# Patient Record
Sex: Female | Born: 1935 | Race: White | Hispanic: No | Smoking: Never smoker
Health system: Southern US, Community
[De-identification: ages and names within clinical notes are randomized; demographics above are authoritative.]

## PROBLEM LIST (undated history)

## (undated) DIAGNOSIS — M810 Age-related osteoporosis without current pathological fracture: Secondary | ICD-10-CM

## (undated) DIAGNOSIS — K5900 Constipation, unspecified: Secondary | ICD-10-CM

## (undated) DIAGNOSIS — Z872 Personal history of diseases of the skin and subcutaneous tissue: Secondary | ICD-10-CM

## (undated) DIAGNOSIS — I509 Heart failure, unspecified: Secondary | ICD-10-CM

## (undated) DIAGNOSIS — Z950 Presence of cardiac pacemaker: Secondary | ICD-10-CM

## (undated) DIAGNOSIS — D649 Anemia, unspecified: Secondary | ICD-10-CM

## (undated) DIAGNOSIS — M1990 Unspecified osteoarthritis, unspecified site: Secondary | ICD-10-CM

## (undated) DIAGNOSIS — R238 Other skin changes: Secondary | ICD-10-CM

## (undated) DIAGNOSIS — R0602 Shortness of breath: Secondary | ICD-10-CM

## (undated) DIAGNOSIS — C569 Malignant neoplasm of unspecified ovary: Secondary | ICD-10-CM

## (undated) DIAGNOSIS — I4891 Unspecified atrial fibrillation: Secondary | ICD-10-CM

## (undated) DIAGNOSIS — I10 Essential (primary) hypertension: Secondary | ICD-10-CM

## (undated) DIAGNOSIS — K219 Gastro-esophageal reflux disease without esophagitis: Secondary | ICD-10-CM

## (undated) DIAGNOSIS — R233 Spontaneous ecchymoses: Secondary | ICD-10-CM

## (undated) DIAGNOSIS — C189 Malignant neoplasm of colon, unspecified: Secondary | ICD-10-CM

## (undated) DIAGNOSIS — I495 Sick sinus syndrome: Secondary | ICD-10-CM

## (undated) DIAGNOSIS — Z8619 Personal history of other infectious and parasitic diseases: Secondary | ICD-10-CM

## (undated) HISTORY — DX: Malignant neoplasm of unspecified ovary: C56.9

## (undated) HISTORY — PX: ABDOMINAL HYSTERECTOMY: SHX81

## (undated) HISTORY — PX: OOPHORECTOMY: SHX86

## (undated) HISTORY — PX: OTHER SURGICAL HISTORY: SHX169

---

## 1999-12-14 ENCOUNTER — Encounter: Payer: Self-pay | Admitting: Emergency Medicine

## 1999-12-14 ENCOUNTER — Inpatient Hospital Stay (HOSPITAL_COMMUNITY): Admission: EM | Admit: 1999-12-14 | Discharge: 1999-12-16 | Payer: Self-pay

## 2000-01-27 ENCOUNTER — Encounter: Admission: RE | Admit: 2000-01-27 | Discharge: 2000-03-17 | Payer: Self-pay | Admitting: Orthopedic Surgery

## 2002-12-29 ENCOUNTER — Inpatient Hospital Stay (HOSPITAL_COMMUNITY): Admission: EM | Admit: 2002-12-29 | Discharge: 2003-01-02 | Payer: Self-pay | Admitting: *Deleted

## 2002-12-30 ENCOUNTER — Encounter: Payer: Self-pay | Admitting: Internal Medicine

## 2003-01-02 ENCOUNTER — Ambulatory Visit: Admission: RE | Admit: 2003-01-02 | Discharge: 2003-01-02 | Payer: Self-pay | Admitting: Gynecology

## 2003-01-09 ENCOUNTER — Inpatient Hospital Stay (HOSPITAL_COMMUNITY): Admission: RE | Admit: 2003-01-09 | Discharge: 2003-01-17 | Payer: Self-pay | Admitting: Obstetrics & Gynecology

## 2003-01-09 ENCOUNTER — Encounter (INDEPENDENT_AMBULATORY_CARE_PROVIDER_SITE_OTHER): Payer: Self-pay

## 2003-02-09 ENCOUNTER — Encounter: Admission: RE | Admit: 2003-02-09 | Discharge: 2003-02-09 | Payer: Self-pay | Admitting: Oncology

## 2003-07-02 ENCOUNTER — Encounter: Admission: RE | Admit: 2003-07-02 | Discharge: 2003-09-30 | Payer: Self-pay | Admitting: Oncology

## 2003-07-10 ENCOUNTER — Ambulatory Visit: Admission: RE | Admit: 2003-07-10 | Discharge: 2003-07-10 | Payer: Self-pay | Admitting: Gynecology

## 2003-12-18 ENCOUNTER — Ambulatory Visit: Admission: RE | Admit: 2003-12-18 | Discharge: 2003-12-18 | Payer: Self-pay | Admitting: Gynecology

## 2004-01-13 ENCOUNTER — Inpatient Hospital Stay (HOSPITAL_COMMUNITY): Admission: AD | Admit: 2004-01-13 | Discharge: 2004-01-16 | Payer: Self-pay | Admitting: Cardiology

## 2004-01-13 ENCOUNTER — Emergency Department (HOSPITAL_COMMUNITY): Admission: EM | Admit: 2004-01-13 | Discharge: 2004-01-13 | Payer: Self-pay | Admitting: Emergency Medicine

## 2004-01-15 ENCOUNTER — Encounter (INDEPENDENT_AMBULATORY_CARE_PROVIDER_SITE_OTHER): Payer: Self-pay | Admitting: Cardiology

## 2004-02-14 ENCOUNTER — Ambulatory Visit (HOSPITAL_COMMUNITY): Admission: RE | Admit: 2004-02-14 | Discharge: 2004-02-14 | Payer: Self-pay | Admitting: Cardiology

## 2004-03-25 ENCOUNTER — Ambulatory Visit (HOSPITAL_COMMUNITY): Admission: RE | Admit: 2004-03-25 | Discharge: 2004-03-25 | Payer: Self-pay | Admitting: Cardiology

## 2004-04-08 ENCOUNTER — Ambulatory Visit: Payer: Self-pay | Admitting: Oncology

## 2004-05-27 ENCOUNTER — Inpatient Hospital Stay (HOSPITAL_COMMUNITY): Admission: AD | Admit: 2004-05-27 | Discharge: 2004-05-31 | Payer: Self-pay | Admitting: Cardiology

## 2004-07-07 ENCOUNTER — Ambulatory Visit: Payer: Self-pay | Admitting: Oncology

## 2004-10-06 ENCOUNTER — Ambulatory Visit: Payer: Self-pay | Admitting: Oncology

## 2004-10-10 ENCOUNTER — Ambulatory Visit: Admission: RE | Admit: 2004-10-10 | Discharge: 2004-10-10 | Payer: Self-pay | Admitting: Gynecology

## 2005-01-01 ENCOUNTER — Ambulatory Visit: Payer: Self-pay | Admitting: Oncology

## 2005-06-11 ENCOUNTER — Ambulatory Visit: Payer: Self-pay | Admitting: Oncology

## 2005-07-24 ENCOUNTER — Ambulatory Visit: Admission: RE | Admit: 2005-07-24 | Discharge: 2005-07-24 | Payer: Self-pay | Admitting: Gynecology

## 2005-08-06 ENCOUNTER — Encounter: Admission: RE | Admit: 2005-08-06 | Discharge: 2005-09-02 | Payer: Self-pay | Admitting: Family Medicine

## 2005-09-03 ENCOUNTER — Encounter: Admission: RE | Admit: 2005-09-03 | Discharge: 2005-10-05 | Payer: Self-pay | Admitting: Family Medicine

## 2005-12-30 ENCOUNTER — Ambulatory Visit: Payer: Self-pay | Admitting: Oncology

## 2006-03-16 DIAGNOSIS — I495 Sick sinus syndrome: Secondary | ICD-10-CM

## 2006-03-16 HISTORY — DX: Sick sinus syndrome: I49.5

## 2006-03-16 HISTORY — PX: PACEMAKER PLACEMENT: SHX43

## 2006-06-24 ENCOUNTER — Ambulatory Visit: Payer: Self-pay | Admitting: Oncology

## 2006-06-29 LAB — CA 125: CA 125: 10.1 U/mL (ref 0.0–30.2)

## 2006-08-06 ENCOUNTER — Ambulatory Visit: Admission: RE | Admit: 2006-08-06 | Discharge: 2006-08-06 | Payer: Self-pay | Admitting: Gynecology

## 2006-12-24 ENCOUNTER — Ambulatory Visit: Payer: Self-pay | Admitting: Oncology

## 2006-12-28 LAB — CBC WITH DIFFERENTIAL/PLATELET
BASO%: 0.9 % (ref 0.0–2.0)
Eosinophils Absolute: 0 10*3/uL (ref 0.0–0.5)
MCV: 91.7 fL (ref 81.0–101.0)
MONO#: 0.4 10*3/uL (ref 0.1–0.9)
MONO%: 10.6 % (ref 0.0–13.0)
NEUT#: 2.5 10*3/uL (ref 1.5–6.5)
RBC: 4.26 10*6/uL (ref 3.70–5.32)
RDW: 10.8 % — ABNORMAL LOW (ref 11.3–14.5)
WBC: 4.1 10*3/uL (ref 3.9–10.0)

## 2006-12-28 LAB — COMPREHENSIVE METABOLIC PANEL
ALT: 13 U/L (ref 0–35)
AST: 20 U/L (ref 0–37)
CO2: 27 mEq/L (ref 19–32)
Sodium: 141 mEq/L (ref 135–145)
Total Bilirubin: 0.5 mg/dL (ref 0.3–1.2)
Total Protein: 6.6 g/dL (ref 6.0–8.3)

## 2006-12-28 LAB — CA 125: CA 125: 9.2 U/mL (ref 0.0–30.2)

## 2007-07-12 ENCOUNTER — Ambulatory Visit: Payer: Self-pay | Admitting: Oncology

## 2007-07-15 LAB — CA 125: CA 125: 8.8 U/mL (ref 0.0–30.2)

## 2007-08-05 ENCOUNTER — Ambulatory Visit: Admission: RE | Admit: 2007-08-05 | Discharge: 2007-08-05 | Payer: Self-pay | Admitting: Gynecology

## 2007-12-26 ENCOUNTER — Ambulatory Visit: Payer: Self-pay | Admitting: Oncology

## 2007-12-28 LAB — CBC WITH DIFFERENTIAL/PLATELET
BASO%: 0.5 % (ref 0.0–2.0)
Basophils Absolute: 0 10*3/uL (ref 0.0–0.1)
EOS%: 1.1 % (ref 0.0–7.0)
Eosinophils Absolute: 0.1 10*3/uL (ref 0.0–0.5)
HCT: 40.5 % (ref 34.8–46.6)
HGB: 14.1 g/dL (ref 11.6–15.9)
LYMPH%: 25.6 % (ref 14.0–48.0)
MCH: 31.3 pg (ref 26.0–34.0)
MCHC: 34.7 g/dL (ref 32.0–36.0)
MCV: 90.2 fL (ref 81.0–101.0)
MONO#: 0.5 10*3/uL (ref 0.1–0.9)
MONO%: 9.7 % (ref 0.0–13.0)
NEUT#: 3 10*3/uL (ref 1.5–6.5)
NEUT%: 63.1 % (ref 39.6–76.8)
Platelets: 208 10*3/uL (ref 145–400)
RBC: 4.5 10*6/uL (ref 3.70–5.32)
RDW: 13.7 % (ref 11.3–14.5)
WBC: 4.8 10*3/uL (ref 3.9–10.0)
lymph#: 1.2 10*3/uL (ref 0.9–3.3)

## 2007-12-28 LAB — COMPREHENSIVE METABOLIC PANEL
ALT: 16 U/L (ref 0–35)
AST: 21 U/L (ref 0–37)
Albumin: 4.3 g/dL (ref 3.5–5.2)
Alkaline Phosphatase: 52 U/L (ref 39–117)
BUN: 12 mg/dL (ref 6–23)
CO2: 30 mEq/L (ref 19–32)
Calcium: 9.7 mg/dL (ref 8.4–10.5)
Chloride: 103 mEq/L (ref 96–112)
Creatinine, Ser: 0.88 mg/dL (ref 0.40–1.20)
Glucose, Bld: 87 mg/dL (ref 70–99)
Potassium: 4.4 mEq/L (ref 3.5–5.3)
Sodium: 141 mEq/L (ref 135–145)
Total Bilirubin: 0.6 mg/dL (ref 0.3–1.2)
Total Protein: 6.9 g/dL (ref 6.0–8.3)

## 2007-12-28 LAB — CA 125: CA 125: 8.6 U/mL (ref 0.0–30.2)

## 2008-04-04 LAB — HM PAP SMEAR: HM Pap smear: NEGATIVE

## 2008-06-28 ENCOUNTER — Ambulatory Visit (HOSPITAL_COMMUNITY): Admission: RE | Admit: 2008-06-28 | Discharge: 2008-06-28 | Payer: Self-pay | Admitting: Ophthalmology

## 2008-07-11 ENCOUNTER — Ambulatory Visit: Payer: Self-pay | Admitting: Oncology

## 2008-07-12 ENCOUNTER — Ambulatory Visit (HOSPITAL_COMMUNITY): Admission: RE | Admit: 2008-07-12 | Discharge: 2008-07-12 | Payer: Self-pay | Admitting: Ophthalmology

## 2008-07-18 LAB — CA 125: CA 125: 7.4 U/mL (ref 0.0–30.2)

## 2008-12-24 ENCOUNTER — Ambulatory Visit: Payer: Self-pay | Admitting: Oncology

## 2008-12-26 LAB — COMPREHENSIVE METABOLIC PANEL
ALT: 18 U/L (ref 0–35)
AST: 25 U/L (ref 0–37)
Albumin: 4.3 g/dL (ref 3.5–5.2)
Alkaline Phosphatase: 59 U/L (ref 39–117)
BUN: 15 mg/dL (ref 6–23)
CO2: 25 mEq/L (ref 19–32)
Calcium: 9.2 mg/dL (ref 8.4–10.5)
Chloride: 105 mEq/L (ref 96–112)
Creatinine, Ser: 0.94 mg/dL (ref 0.40–1.20)
Glucose, Bld: 94 mg/dL (ref 70–99)
Potassium: 4.4 mEq/L (ref 3.5–5.3)
Sodium: 140 mEq/L (ref 135–145)
Total Bilirubin: 0.6 mg/dL (ref 0.3–1.2)
Total Protein: 7 g/dL (ref 6.0–8.3)

## 2008-12-26 LAB — CBC WITH DIFFERENTIAL/PLATELET
BASO%: 0.5 % (ref 0.0–2.0)
Basophils Absolute: 0 10*3/uL (ref 0.0–0.1)
EOS%: 0.8 % (ref 0.0–7.0)
Eosinophils Absolute: 0 10*3/uL (ref 0.0–0.5)
HCT: 38.8 % (ref 34.8–46.6)
HGB: 13.3 g/dL (ref 11.6–15.9)
LYMPH%: 16.8 % (ref 14.0–49.7)
MCH: 31.3 pg (ref 25.1–34.0)
MCHC: 34.3 g/dL (ref 31.5–36.0)
MCV: 91.3 fL (ref 79.5–101.0)
MONO#: 0.5 10*3/uL (ref 0.1–0.9)
MONO%: 9 % (ref 0.0–14.0)
NEUT#: 4.4 10*3/uL (ref 1.5–6.5)
NEUT%: 72.9 % (ref 38.4–76.8)
Platelets: 196 10*3/uL (ref 145–400)
RBC: 4.25 10*6/uL (ref 3.70–5.45)
RDW: 13.4 % (ref 11.2–14.5)
WBC: 6.1 10*3/uL (ref 3.9–10.3)
lymph#: 1 10*3/uL (ref 0.9–3.3)

## 2008-12-26 LAB — CA 125: CA 125: 9.4 U/mL (ref 0.0–30.2)

## 2009-06-13 LAB — HM MAMMOGRAPHY: HM Mammogram: NEGATIVE

## 2009-07-24 LAB — HM DEXA SCAN

## 2010-04-05 ENCOUNTER — Encounter: Payer: Self-pay | Admitting: Oncology

## 2010-05-28 LAB — POCT INR: INR: 2 — AB (ref <=1.1)

## 2010-05-31 ENCOUNTER — Encounter: Payer: Self-pay | Admitting: *Deleted

## 2010-05-31 DIAGNOSIS — I4891 Unspecified atrial fibrillation: Secondary | ICD-10-CM

## 2010-05-31 DIAGNOSIS — Z950 Presence of cardiac pacemaker: Secondary | ICD-10-CM | POA: Insufficient documentation

## 2010-05-31 DIAGNOSIS — R609 Edema, unspecified: Secondary | ICD-10-CM | POA: Insufficient documentation

## 2010-05-31 DIAGNOSIS — M810 Age-related osteoporosis without current pathological fracture: Secondary | ICD-10-CM | POA: Insufficient documentation

## 2010-05-31 DIAGNOSIS — I10 Essential (primary) hypertension: Secondary | ICD-10-CM | POA: Insufficient documentation

## 2010-05-31 DIAGNOSIS — K589 Irritable bowel syndrome without diarrhea: Secondary | ICD-10-CM | POA: Insufficient documentation

## 2010-06-24 ENCOUNTER — Emergency Department (HOSPITAL_COMMUNITY): Admission: EM | Admit: 2010-06-24 | Payer: Self-pay | Source: Home / Self Care

## 2010-06-25 LAB — BASIC METABOLIC PANEL
BUN: 13 mg/dL (ref 6–23)
CO2: 29 mEq/L (ref 19–32)
Calcium: 9.3 mg/dL (ref 8.4–10.5)
Chloride: 104 mEq/L (ref 96–112)
Creatinine, Ser: 0.88 mg/dL (ref 0.4–1.2)
GFR calc Af Amer: >60 mL/min (ref >60)
GFR calc non Af Amer: >60 mL/min (ref >60)
Glucose, Bld: 104 mg/dL — ABNORMAL HIGH (ref 70–99)
Potassium: 3.9 mEq/L (ref 3.5–5.1)
Sodium: 140 mEq/L (ref 135–145)

## 2010-06-25 LAB — HEMOGLOBIN AND HEMATOCRIT, BLOOD
HCT: 37.5 % (ref 36.0–46.0)
Hemoglobin: 13.1 g/dL (ref 12.0–15.0)

## 2010-07-29 NOTE — Consult Note (Signed)
[name], TRIBBEY                  ACCOUNT NO.:  [id number]   MEDICAL RECORD NO.:  [id number]          PATIENT TYPE:  OUT   LOCATION:  GYN                          FACILITY:  [facility]   PHYSICIAN:  De Blanch, M.D.DATE OF BIRTH:  1923/01/24   DATE OF CONSULTATION:  08/05/2007  DATE OF DISCHARGE:                                 CONSULTATION   CHIEF COMPLAINT:  Ovarian cancer.   INTERVAL HISTORY:  The patient returns today for continuing followup.  Since her last visit she has done well.  She denies any GI or GU  symptoms.  She has no pelvic pain, pressure, vaginal bleeding or  discharge.   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  After surgical staging, she  received six cycles of carboplatin and Taxol chemotherapy, her last  being administered in March 2005.  CA125 prior to chemotherapy was 82  and has fallen to normal.  Her most recent CA125 on Jul 15, 2007, was 9  units per mL (stable).   PAST MEDICAL ILLNESS HISTORY:  1. Shingles.  2. Superficial phlebitis.  3. Recent episodes of cellulitis in her lower extremity.  4. Placement of a cardiac pacemaker.   PAST SURGICAL HISTORY:  1. Vaginal hysterectomy.  2. Ovarian cancer staging and resection in 2004.  3. Placement of a pacemaker in 2006.   ALLERGIES:  SULFA.   SOCIAL HISTORY:  The patient lives by herself.  She exercises on a  treadmill three times a week.  She does not smoke.  She has three adult  children who live nearby, who are very attentive.   CURRENT MEDICATIONS:  1. Coumadin as directed.  2. Fosamax.  3. Altace.  4. Betapace AF.  5. Vitamin D.  6. Colace.   REVIEW OF SYSTEMS:  A 10-point comprehensive review of systems is  negative except as noted above.   PHYSICAL EXAMINATION:  VITAL SIGNS:  Weight 200 pounds, blood pressure  132/84, pulse 80, respirations 20.  GENERAL:  The patient is a healthy elderly female, in no acute  distress.  HEENT:  Negative.  NECK:  Supple without thyromegaly.  NODES:  There are no supraclavicular or inguinal adenopathy.  ABDOMEN:  Soft, nontender.  No masses, organomegaly or hernia noted.  PELVIC EXAM:  EG, BUS, vagina and urethra are normal.  Cervix and uterus  surgically absent.  Adnexa without masses.  RECTOVAGINAL:  Exam confirms.  EXTREMITIES:  Lower extremities with 1+ ankle edema.   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  No evidence of recurrent disease.  Normal examination and normal CA125  value.   FOLLOWUP:  1. She will return to see Dr. Ottie Glazier P. Livesay in six months.  2. She will return to see [place] in one year.  Will continue to monitor      the CA125 values at each visit.      De Blanch, M.D.  Electronically Signed     DC/MEDQ  D:  08/05/2007  T:  08/05/2007  Job:  [id number]   cc:   Lennis P. Darrold Span, M.D.  Fax: [phone]   Gretta Cool, M.D.  Fax: [phone]   Francisca December, M.D.  Fax: [phone]   Telford Nab, R.N.  [address]  [place], [place] [address]

## 2010-07-29 NOTE — Consult Note (Signed)
[name], [name]                  ACCOUNT NO.:  [id number]   MEDICAL RECORD NO.:  [id number]          PATIENT TYPE:  OUT   LOCATION:  GYN                          FACILITY:  [facility]   PHYSICIAN:  De Blanch, M.D.DATE OF BIRTH:  18-Jun-1922   DATE OF CONSULTATION:  DATE OF DISCHARGE:                                 CONSULTATION   CHIEF COMPLAINT:  Ovarian cancer.   INTERVAL HISTORY:  Since her last visit, the patient has done well.  She  denies any GI or GU symptoms.  There is no pelvic pain, pressure,  vaginal bleeding or discharge.  Functional status is excellent.  She  recently has had her 84nd birthday.   HISTORY OF PRESENT ILLNESS:  Stage I-C clear cell carcinoma of the  ovary, initially diagnosed October 2004.  She received 6 cycles of  carboplatin and Taxol, chemotherapy, the last administered March of  2005.  CA125 prior to chemotherapy was 82 units per mL but has fallen to  normal.   CA125 on June 29, 2006 was 10 units per mL.   PAST MEDICAL HISTORY:  Medical illnesses:  Shingles, superficial  phlebitis, recent episode of cellulitis in her lower extremity,  placement of cardiac pacemaker.   PAST SURGICAL HISTORY:  Vaginal hysterectomy, ovarian cancer staging  2004, placement of pacemaker 2006.   DRUG ALLERGIES:  SULFA.   SOCIAL HISTORY:  The patient lives by herself.  She exercises on [name]  treadmill on [name] regular basis.  She does not smoke or drink.   CURRENT MEDICATIONS:  1. Coumadin.  2. Stolel.  3. Aceon.  4. Antibiotic.  5. Diuretic.   REVIEW OF SYSTEMS:  [name] 10-point comprehensive review of system negative,  except as noted above.   PHYSICAL EXAMINATION:  VITAL SIGNS:  Weight 197 pounds, blood pressure  134/80.  GENERAL:  The patient is [name] healthy elderly white female in no distress.  HEENT:  Negative.  NECK:  Supple without thyromegaly.  There is no supraclavicular or  inguinal adenopathy.  ABDOMEN:  Obese, soft, nontender.  No mass,  organomegaly, ascites or  hernias noted.  PELVIC:  EGBUS, vagina, bladder, urethra are normal.  Cervix and uterus  are surgically absent.  Adnexa without masses, rectovaginal exam  confirms.  LOWER EXTREMITIES:  1+ ankle edema.   IMPRESSION:  Stage I-C clear cell carcinoma of the ovary October 2004.  No evidence of recurrent disease.   The patient to return to see Dr. Darrold Span in 6 months, return to see [place]  in one year.  Will continue to monitor CA125 at each visit.      De Blanch, M.D.  Electronically Signed     DC/MEDQ  D:  08/06/2006  T:  08/06/2006  Job:  [id number]   cc:   Lennis P. Darrold Span, M.D.  Fax: [phone]   Gretta Cool, M.D.  Fax: [phone]   Telford Nab, R.Carla.  [address]  [place], [place] [address]   Francisca December, M.D.  Fax: [phone]

## 2010-08-01 NOTE — Op Note (Signed)
[name], [name]                  ACCOUNT NO.:  [id number]   MEDICAL RECORD NO.:  [id number]          PATIENT TYPE:  INP   LOCATION:  2903                         FACILITY:  [facility]   PHYSICIAN:  Francisca December, M.D.  DATE OF BIRTH:  1923/02/02   DATE OF PROCEDURE:  05/28/2004  DATE OF DISCHARGE:                                 OPERATIVE REPORT   PROCEDURE PERFORMED:  Elective direct-current cardioversion.   ATTENDING:  Francisca December, M.D.   INDICATIONS:  [name] is a 77 year old woman with recurrent atrial fib-  flutter.  She reported to the office yesterday with rapid ventricular  response.  She was admitted and required IV Cardizem for rate control  overnight.  She has been chronically anticoagulated.  Her INR this morning  is 2.  She is to undergo elective cardioversion at this time.   PROCEDURE NOTE:  The patient had elective cardioversion performed in the  hospital room.  Anesthesia was in attendance and induced deep anesthesia  with 75 mg of Pentothal.  At that point, the patient was delivered a 50-  joule dose of transthoracic biphasic synchronized energy.  The initial  response with asystole.  This persisted with rare junctional B.  Transthoracic pacing was required at 70 beats per minute, 80 MEV.  I slowly  decreased the rate to 30 beats per minute, but no saw no intrinsic rhythm.  I finally discontinue transthoracic pacing and she initiated a junctional  rhythm at 40 beats per minute, which is continuing.  Her blood pressure is  between 91 and 100 systolic, and the patient is awakening without evidence  of sequelae.   IMPRESSION:  Successful elective cardioversion, but with severe sick sinus  syndrome demonstrated in a junctional bradycardia.   PLAN:  I have counseled the patient to undergo and she has accepted  permanent pacemaker insertion.  Goals, risks and alternatives were  discussed.  The patient states understanding, and wishes to proceed.      JHE/MEDQ  D:  05/28/2004  T:  05/28/2004  Job:  [id number]   cc:   Ernestina Penna, M.D.  [address] [place]  [place] [address]  Fax: [phone]
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
## 2010-08-01 NOTE — Discharge Summary (Signed)
NAMELARSEN, ZETTEL                  ACCOUNT NO.:  [id number]   MEDICAL RECORD NO.:  [id number]          PATIENT TYPE:  INP   LOCATION:  6533                         FACILITY:  [facility]   PHYSICIAN:  Cassell Clement, M.D. DATE OF BIRTH:  Sep 28, 1922   DATE OF ADMISSION:  05/27/2004  DATE OF DISCHARGE:  05/31/2004                                 DISCHARGE SUMMARY   CHIEF COMPLAINT AND REASON FOR ADMISSION:  Ms. Benningfield is a 77 year old female  patient with history of longstanding atrial fibrillation on Coumadin, status  post direct current cardioversion successful to sinus rhythm in December  2005.  She presented to the office on the date of admission for followup  regarding med changes and intolerance to Diovan secondary to nausea.  She  has prior history of intolerance to Lasix secondary to swelling, and in the  past metoprolol and Cardizem were stopped due to bradycardia.  She presented  to the office with complaints of nonexertional shortness of breath, fatigue,  no tachy palpitations, mild dysuria.  She was evaluated by her primary care  physician and found to not have a UTI.  She was placed on over-the-counter  Azo.  Her blood pressure today was elevated at 140/100 and heart rate up  into the 120s and was regular on exam.  EKG in the office showed presumed  atrial flutter with 2:1 conduction.  In the office she received a total of  10 mg IV metoprolol without a significant decrease in her heart rate.  The  patient was also given Adenocard 6 mg IV per Dr. Amil Amen in the office and  this was able to show that the patient was in atrial flutter, as the  underlying rhythm.  The rate slowed, but she immediately went back into  atrial flutter with a 2:1 conduction ventricular rate in the 120s, at that  time it was determined that the patient needed to be admitted to [facility] for further evaluation regarding rate control and probable  cardioversion.  Please refer to the handwritten  H&P for additional details.   This patient was admitted with the following diagnoses:  1.  Recurrent atrial fibrillation/atrial flutter now in 2:1 conduction with      rapid ventricular response.  2.  Hypertension.  3.  Chronic Coumadin.  4.  Dysuria.   HOSPITAL COURSE:  1.  Recurrent atrial fibrillation/flutter.  The patient was admitted to the      telemetry unit where IV Cardizem was started, she was continued on p.o.      Lopressor for rate control and hypertensive issues, continued on her      Coumadin, since the patient has a history of bradycardia on beta-      blockers in the past.  Monitoring for bradycardia was also instituted.      By the following day the patient remained in atrial flutter with a      varying block, as the ventricular rate was down in the 60s.  Dr. Amil Amen      determined the patient had mild tachybrady syndrome and diastolic  heart      failure.  Office records were reviewed and there was some consideration      that the patient may be a candidate for a pacemaker for tachybrady      syndrome.  On May 29, 2004, Dr. Amil Amen opted to proceed with direct      current cardioversion.  She received 50 joules with synchronized      biphasic shock with asystole, required temporary transthoracic pacing at      70 beats per minute, gradually regained a slow rate of junctional rhythm      42-45 beats per minute, at this point it was determined the patient      would need a pacemaker implantation.  Later that day she went to the      Cath Lab where she underwent a Dual-Chamber pacemaker implant per Dr.      Amil Amen.  Please refer to his OP note for details.  She had a Medtronic      EnRhythm Dual-Chamber pacemaker inserted without any complications.  The      pacer was checked on May 29, 2004 and was functioning normally without      any device changes.  She had remained on IV Cardizem and was switched to      p.o. Cardizem on May 29, 2004.  She had also been  started on sotalol      and this was continued, QTC was less than 500 milliseconds, she was      ambulated and monitored.  By May 30, 2004, she was continued on her      sotalol load, no bradyarrhythmias, pacemaker site was unremarkable, and      by May 31, 2004, the patient was deemed appropriate for discharge      home.  QTC was less than 500 milliseconds, and Dr. Patty Sermons discharged      the patient home.  2.  UTI.  The patient had been complaining of dysuria.  Prior to admission a      urine culture was obtained and was positive for Proteus.  The patient      was started on Cipro and this was continued at discharge.   FINAL DISCHARGE DIAGNOSES:  1.  Recurrent atrial fibrillation with atrial flutter 2:1 conduction.  2.  Status post direct current cardioversion with subsequent bradyarrhythmia      junctional rhythm requiring a permanent pacemaker insertion.  3.  Status post permanent pacemaker insertion, Medtronic EnRhythm Dual-      Chamber on May 28, 2004 by Dr. Amil Amen.  4.  Status post sotalol load for recurrent atrial fibrillation.  5.  Tachybrady syndrome requiring pacemaker insertion.  6.  Proteus urinary tract infection started on Cipro.   DISCHARGE MEDICATIONS:  1.  Sotalol 80 mg b.i.d.  2.  Coumadin 1/2 tablet daily, except 1 whole tablet Monday and Fridays.  3.  Cipro 250 mg b.i.d. until gone.  4.  Instructions to not take Cardizem, Diovan, potassium, or Edecrin.   PAIN MANAGEMENT:  Tylenol as needed.   ACTIVITY:  See pacemaker discharge instructions.   WOUND CARE:  See pacemaker discharge instructions.   DIET:  Low salt, caffeine free.   SPECIAL INSTRUCTIONS:  She is to have a Coumadin Clinic appointment at Dr.  Kathi Der office on Wednesday, June 04, 2004.  She is to follow-up with Dr.  Amil Amen on Thursday, June 12, 2004 at 12:20 p.m.      ALE/MEDQ  D:  07/30/2004  T:  07/30/2004  Job:  [id number]   cc:   Christell Constant, MD

## 2011-03-27 ENCOUNTER — Inpatient Hospital Stay (HOSPITAL_COMMUNITY)
Admission: EM | Admit: 2011-03-27 | Discharge: 2011-04-13 | DRG: 981 | Disposition: A | Discharge status: Skilled Nursing Facility | Payer: Medicare Other | Attending: General Surgery | Admitting: General Surgery

## 2011-03-27 ENCOUNTER — Encounter (HOSPITAL_COMMUNITY): Payer: Self-pay

## 2011-03-27 ENCOUNTER — Emergency Department (HOSPITAL_COMMUNITY): Payer: Medicare Other

## 2011-03-27 DIAGNOSIS — D649 Anemia, unspecified: Secondary | ICD-10-CM | POA: Diagnosis present

## 2011-03-27 DIAGNOSIS — Z7901 Long term (current) use of anticoagulants: Secondary | ICD-10-CM

## 2011-03-27 DIAGNOSIS — K660 Peritoneal adhesions (postprocedural) (postinfection): Secondary | ICD-10-CM | POA: Diagnosis present

## 2011-03-27 DIAGNOSIS — C569 Malignant neoplasm of unspecified ovary: Secondary | ICD-10-CM | POA: Insufficient documentation

## 2011-03-27 DIAGNOSIS — I5033 Acute on chronic diastolic (congestive) heart failure: Secondary | ICD-10-CM | POA: Diagnosis present

## 2011-03-27 DIAGNOSIS — K5900 Constipation, unspecified: Secondary | ICD-10-CM | POA: Diagnosis present

## 2011-03-27 DIAGNOSIS — I4891 Unspecified atrial fibrillation: Secondary | ICD-10-CM | POA: Diagnosis present

## 2011-03-27 DIAGNOSIS — Z950 Presence of cardiac pacemaker: Secondary | ICD-10-CM

## 2011-03-27 DIAGNOSIS — C187 Malignant neoplasm of sigmoid colon: Secondary | ICD-10-CM | POA: Diagnosis present

## 2011-03-27 DIAGNOSIS — K6389 Other specified diseases of intestine: Secondary | ICD-10-CM | POA: Diagnosis present

## 2011-03-27 DIAGNOSIS — I495 Sick sinus syndrome: Secondary | ICD-10-CM | POA: Diagnosis present

## 2011-03-27 DIAGNOSIS — I4892 Unspecified atrial flutter: Principal | ICD-10-CM | POA: Diagnosis present

## 2011-03-27 DIAGNOSIS — Z8543 Personal history of malignant neoplasm of ovary: Secondary | ICD-10-CM

## 2011-03-27 DIAGNOSIS — I509 Heart failure, unspecified: Secondary | ICD-10-CM | POA: Diagnosis present

## 2011-03-27 DIAGNOSIS — K560 Paralytic ileus: Secondary | ICD-10-CM | POA: Diagnosis present

## 2011-03-27 DIAGNOSIS — I10 Essential (primary) hypertension: Secondary | ICD-10-CM | POA: Diagnosis present

## 2011-03-27 DIAGNOSIS — R791 Abnormal coagulation profile: Secondary | ICD-10-CM | POA: Diagnosis present

## 2011-03-27 DIAGNOSIS — K449 Diaphragmatic hernia without obstruction or gangrene: Secondary | ICD-10-CM | POA: Diagnosis present

## 2011-03-27 HISTORY — DX: Essential (primary) hypertension: I10

## 2011-03-27 HISTORY — DX: Heart failure, unspecified: I50.9

## 2011-03-27 HISTORY — DX: Unspecified atrial fibrillation: I48.91

## 2011-03-27 HISTORY — DX: Sick sinus syndrome: I49.5

## 2011-03-27 HISTORY — DX: Shortness of breath: R06.02

## 2011-03-27 LAB — PROTIME-INR
INR: 1.22 (ref 0.00–1.49)
Prothrombin Time: 15.4 seconds — ABNORMAL HIGH (ref 11.6–15.2)

## 2011-03-27 LAB — MAGNESIUM: Magnesium: 2.1 mg/dL (ref 1.5–2.5)

## 2011-03-27 LAB — COMPREHENSIVE METABOLIC PANEL
ALT: 22 U/L (ref 0–35)
AST: 28 U/L (ref 0–37)
Calcium: 9.2 mg/dL (ref 8.4–10.5)
Creatinine, Ser: 0.67 mg/dL (ref 0.50–1.10)
GFR calc Af Amer: 90 mL/min — ABNORMAL LOW (ref >90)
GFR calc non Af Amer: 77 mL/min — ABNORMAL LOW (ref >90)
Glucose, Bld: 94 mg/dL (ref 70–99)
Sodium: 139 mEq/L (ref 135–145)
Total Protein: 6.4 g/dL (ref 6.0–8.3)

## 2011-03-27 LAB — CBC
MCV: 83 fL (ref 78.0–100.0)
Platelets: 248 10*3/uL (ref 150–400)
RBC: 4 MIL/uL (ref 3.87–5.11)
WBC: 10 10*3/uL (ref 4.0–10.5)

## 2011-03-27 LAB — BASIC METABOLIC PANEL
CO2: 27 mEq/L (ref 19–32)
Calcium: 9.3 mg/dL (ref 8.4–10.5)
Chloride: 103 mEq/L (ref 96–112)
Potassium: 4 mEq/L (ref 3.5–5.1)
Sodium: 137 mEq/L (ref 135–145)

## 2011-03-27 LAB — DIFFERENTIAL
Eosinophils Relative: 0 % (ref 0–5)
Lymphocytes Relative: 7 % — ABNORMAL LOW (ref 12–46)
Lymphs Abs: 0.7 10*3/uL (ref 0.7–4.0)
Neutrophils Relative %: 84 % — ABNORMAL HIGH (ref 43–77)

## 2011-03-27 LAB — APTT: aPTT: 28 seconds (ref 24–37)

## 2011-03-27 LAB — HEPARIN LEVEL (UNFRACTIONATED): Heparin Unfractionated: 0.14 IU/mL — ABNORMAL LOW (ref 0.30–0.70)

## 2011-03-27 LAB — TSH: TSH: 0.87 u[IU]/mL (ref 0.350–4.500)

## 2011-03-27 LAB — CARDIAC PANEL(CRET KIN+CKTOT+MB+TROPI): Troponin I: <0.3 ng/mL (ref <0.30)

## 2011-03-27 MED ORDER — HEPARIN (PORCINE) IN NACL 100-0.45 UNIT/ML-% IJ SOLN
15.0000 [IU]/kg/h | INTRAMUSCULAR | Status: DC
  Administered 2011-03-27: 15 [IU]/kg/h via INTRAVENOUS
  Filled 2011-03-27 (×3): qty 250

## 2011-03-27 MED ORDER — RAMIPRIL 10 MG PO CAPS
10.0000 mg | ORAL_CAPSULE | Freq: Every day | ORAL | Status: DC
  Administered 2011-03-27 – 2011-04-02 (×7): 10 mg via ORAL
  Filled 2011-03-27 (×8): qty 1

## 2011-03-27 MED ORDER — SODIUM CHLORIDE 0.9 % IV SOLN
Freq: Once | INTRAVENOUS | Status: AC
  Administered 2011-03-27: 05:00:00 via INTRAVENOUS

## 2011-03-27 MED ORDER — HEPARIN SOD (PORCINE) IN D5W 100 UNIT/ML IV SOLN
INTRAVENOUS | Status: AC
  Filled 2011-03-27: qty 250

## 2011-03-27 MED ORDER — HEPARIN BOLUS VIA INFUSION
1000.0000 [IU] | Freq: Once | INTRAVENOUS | Status: DC
  Filled 2011-03-27: qty 1000

## 2011-03-27 MED ORDER — SOTALOL HCL 80 MG PO TABS
160.0000 mg | ORAL_TABLET | Freq: Two times a day (BID) | ORAL | Status: DC
  Administered 2011-03-27 – 2011-03-28 (×2): 160 mg via ORAL
  Administered 2011-03-28: 80 mg via ORAL
  Administered 2011-03-29 – 2011-04-13 (×29): 160 mg via ORAL
  Filled 2011-03-27 (×38): qty 2

## 2011-03-27 MED ORDER — ETHACRYNATE SODIUM 50 MG IV SOLR
50.0000 mg | Freq: Once | INTRAVENOUS | Status: DC
  Filled 2011-03-27: qty 50

## 2011-03-27 MED ORDER — HEPARIN BOLUS VIA INFUSION
5000.0000 [IU] | Freq: Once | INTRAVENOUS | Status: AC
  Administered 2011-03-27: 5000 [IU] via INTRAVENOUS
  Filled 2011-03-27: qty 5000

## 2011-03-27 MED ORDER — HEPARIN (PORCINE) IN NACL 100-0.45 UNIT/ML-% IJ SOLN
12.0000 [IU]/kg/h | INTRAMUSCULAR | Status: DC
  Administered 2011-03-27: 12 [IU]/kg/h via INTRAVENOUS
  Filled 2011-03-27 (×2): qty 250

## 2011-03-27 MED ORDER — ACETAMINOPHEN 325 MG PO TABS
650.0000 mg | ORAL_TABLET | ORAL | Status: DC | PRN
  Filled 2011-03-27: qty 1

## 2011-03-27 MED ORDER — LUBIPROSTONE 24 MCG PO CAPS
24.0000 ug | ORAL_CAPSULE | Freq: Two times a day (BID) | ORAL | Status: DC
  Administered 2011-03-27 – 2011-04-02 (×12): 24 ug via ORAL
  Filled 2011-03-27 (×16): qty 1

## 2011-03-27 MED ORDER — DILTIAZEM HCL 25 MG/5ML IV SOLN
10.0000 mg | Freq: Once | INTRAVENOUS | Status: AC
  Administered 2011-03-27: 10 mg via INTRAVENOUS
  Filled 2011-03-27: qty 5

## 2011-03-27 MED ORDER — CALCIUM CARBONATE-VITAMIN D 500-200 MG-UNIT PO TABS
1.0000 | ORAL_TABLET | Freq: Two times a day (BID) | ORAL | Status: DC
  Administered 2011-03-27 – 2011-04-02 (×13): 1 via ORAL
  Filled 2011-03-27 (×15): qty 1

## 2011-03-27 MED ORDER — SODIUM CHLORIDE 0.9 % IV SOLN
250.0000 mL | INTRAVENOUS | Status: DC | PRN
  Administered 2011-04-08 – 2011-04-11 (×4): 250 mL via INTRAVENOUS

## 2011-03-27 MED ORDER — ETHACRYNATE SODIUM 50 MG IV SOLR
50.0000 mg | Freq: Once | INTRAVENOUS | Status: AC
  Administered 2011-03-27: 50 mg via INTRAVENOUS
  Filled 2011-03-27: qty 50

## 2011-03-27 MED ORDER — POLYSACCHARIDE IRON 150 MG PO CAPS
150.0000 mg | ORAL_CAPSULE | ORAL | Status: DC
  Administered 2011-03-28 – 2011-04-01 (×5): 150 mg via ORAL
  Filled 2011-03-27 (×9): qty 1

## 2011-03-27 MED ORDER — ONDANSETRON HCL 4 MG/2ML IJ SOLN
4.0000 mg | Freq: Four times a day (QID) | INTRAMUSCULAR | Status: DC | PRN
  Administered 2011-04-03: 4 mg via INTRAVENOUS

## 2011-03-27 MED ORDER — DILTIAZEM HCL 100 MG IV SOLR
5.0000 mg/h | INTRAVENOUS | Status: DC
  Administered 2011-03-28: 5 mg/h via INTRAVENOUS
  Filled 2011-03-27: qty 100

## 2011-03-27 MED ORDER — WARFARIN SODIUM 7.5 MG PO TABS
7.5000 mg | ORAL_TABLET | Freq: Once | ORAL | Status: AC
  Administered 2011-03-27: 7.5 mg via ORAL
  Filled 2011-03-27: qty 1

## 2011-03-27 MED ORDER — HEPARIN BOLUS VIA INFUSION
2500.0000 [IU] | Freq: Once | INTRAVENOUS | Status: AC
  Administered 2011-03-27: 2500 [IU] via INTRAVENOUS
  Filled 2011-03-27: qty 2500

## 2011-03-27 MED ORDER — SODIUM CHLORIDE 0.9 % IJ SOLN: 3.0000 mL | INTRAMUSCULAR | Status: DC | PRN

## 2011-03-27 MED ORDER — HEPARIN (PORCINE) IN NACL 100-0.45 UNIT/ML-% IJ SOLN
1400.0000 [IU]/h | INTRAMUSCULAR | Status: DC
  Administered 2011-03-28: 1350 [IU]/h via INTRAVENOUS
  Administered 2011-03-29: 1400 [IU]/h via INTRAVENOUS
  Filled 2011-03-27 (×5): qty 250

## 2011-03-27 MED ORDER — SODIUM CHLORIDE 0.9 % IJ SOLN
3.0000 mL | Freq: Two times a day (BID) | INTRAMUSCULAR | Status: DC
  Administered 2011-03-29 – 2011-04-02 (×3): 3 mL via INTRAVENOUS

## 2011-03-27 NOTE — ED Provider Notes (Signed)
History     CSN: [id number]  Arrival date & time 03/27/11  0102   First MD Initiated Contact with Patient 03/27/11 0407      Chief Complaint  Patient presents with  . Shortness of Breath    (Consider location/radiation/quality/duration/timing/severity/associated sxs/prior treatment) HPI Comments: The patient is an 78 year old female with a history of hypertension, recurrent atrial fibrillation and flutter which was treated with cardioversion and pacer placement. This occurred in 2006. Since that time she has done remarkably well. Overnight the patient noticed acute onset of difficulty breathing feeling like she was "smothered" this awoke her from sleep, was associated with feeling fatigued and weak but no cough fever nausea vomiting diarrhea swelling or chest pain. She did have associated palpitations and felt like her heart was running. This has not happened in some time.  Symptoms were acute in onset Symptoms are intermittent Symptoms are moderate when heart is racing, minimal to mild when heart is in normal rhythm See associated symptoms above  Patient is a 78 y.o. female presenting with shortness of breath. The history is provided by the patient, medical records and the EMS personnel.  Shortness of Breath  Associated symptoms include shortness of breath. Pertinent negatives include no chest pain.    Past Medical History  Diagnosis Date  . Ovarian cancer   . Irregular heart rhythm   . Atrial fibrillation   . Ovarian cancer   . Tachy-brady syndrome   . Diastolic heart failure   . Hypertension     Past Surgical History  Procedure Date  . Cardiac pacemaker placement     No family history on file.  History  Substance Use Topics  . Smoking status: Never Smoker   . Smokeless tobacco: Not on file  . Alcohol Use: No    OB History    Grav Para Term Preterm Abortions TAB SAB Ect Mult Living                  Review of Systems  Constitutional: Negative for chills and  fatigue.  Respiratory: Positive for shortness of breath.   Cardiovascular: Positive for palpitations. Negative for chest pain and leg swelling.  All other systems reviewed and are negative.    Allergies  Lasix; Penicillins; and Sulfa antibiotics  Home Medications   Current Outpatient Rx  Name Route Sig Dispense Refill  . CIPROFLOXACIN HCL 500 MG PO TABS Oral Take 500 mg by mouth 2 (two) times daily.    . LUBIPROSTONE 24 MCG PO CAPS Oral Take 24 mcg by mouth 2 (two) times daily with a meal.      . POLYSACCHARIDE IRON 150 MG PO CAPS Oral Take 150 mg by mouth daily.    Marland Kitchen RAMIPRIL 10 MG PO CAPS Oral Take 10 mg by mouth daily.      Marland Kitchen RIFAMPIN 300 MG PO CAPS Oral Take 300 mg by mouth 2 (two) times daily.    Marland Kitchen SOTALOL HCL 120 MG PO TABS Oral Take 120 mg by mouth 2 (two) times daily.      . WARFARIN SODIUM 5 MG PO TABS Oral Take 5 mg by mouth as directed.      Marland Kitchen CALCIUM 600 + D PO Oral Take 1 tablet by mouth daily.      . DENOSUMAB 60 MG/ML [place] SOLN Subcutaneous Inject 60 mg into the skin once.      Marland Kitchen FEXOFENADINE-PSEUDOEPHED ER 180-240 MG PO TB24 Oral Take 1 tablet by mouth daily.      Marland Kitchen  TRIAMCINOLONE ACETONIDE 55 MCG/ACT NA INHA Nasal 2 sprays by Nasal route daily.        BP 158/72  Pulse 59  Temp(Src) 99.3 F (37.4 C) (Oral)  Resp 27  SpO2 95%  Physical Exam  Nursing note and vitals reviewed. Constitutional: She appears well-developed and well-nourished. No distress.  HENT:  Head: Normocephalic and atraumatic.  Mouth/Throat: Oropharynx is clear and moist. No oropharyngeal exudate.  Eyes: Conjunctivae and EOM are normal. Pupils are equal, round, and reactive to light. Right eye exhibits no discharge. Left eye exhibits no discharge. No scleral icterus.  Neck: Normal range of motion. Neck supple. No JVD present. No thyromegaly present.  Cardiovascular: Normal heart sounds and intact distal pulses.  Exam reveals no gallop and no friction rub.   No murmur heard.      Patient has a  regular pulse at a rate of approximately 60 with intermittent tachycardia to 150. No murmurs  Pulmonary/Chest: Effort normal and breath sounds normal. No respiratory distress. She has no wheezes. She has no rales.  Abdominal: Soft. Bowel sounds are normal. She exhibits no distension and no mass. There is no tenderness.  Musculoskeletal: Normal range of motion. She exhibits no edema and no tenderness.  Lymphadenopathy:    She has no cervical adenopathy.  Neurological: She is alert. Coordination normal.  Skin: Skin is warm and dry. No rash noted. No erythema.  Psychiatric: She has a normal mood and affect. Her behavior is normal.    ED Course  Procedures (including critical care time)  Labs Reviewed  BASIC METABOLIC PANEL - Abnormal; Notable for the following:    Glucose, Bld 185 (*)    GFR calc non Af Amer 74 (*)    GFR calc Af Amer 86 (*)    All other components within normal limits  CBC - Abnormal; Notable for the following:    Hemoglobin 10.0 (*)    HCT 33.2 (*)    MCH 25.0 (*)    RDW 25.2 (*)    All other components within normal limits  DIFFERENTIAL - Abnormal; Notable for the following:    Neutrophils Relative 84 (*)    Neutro Abs 8.4 (*)    Lymphocytes Relative 7 (*)    All other components within normal limits  PROTIME-INR - Abnormal; Notable for the following:    Prothrombin Time 15.4 (*)    All other components within normal limits  APTT  TROPONIN I   Dg Chest Port 1 View  03/27/2011  *RADIOLOGY REPORT*  Clinical Data: Short of breath  PORTABLE CHEST - 1 VIEW  Comparison: 05/29/2004.  Findings: Cardiomegaly. Monitoring leads are projected over the chest.  Dual lead left subclavian cardiac pacemaker.  Interstitial and mild alveolar pulmonary edema.  Basilar atelectasis.  Small bilateral pleural effusions.  IMPRESSION: Mild to moderate CHF.  Original Report Authenticated By: Andreas Newport, M.D.     1. Atrial flutter with rapid ventricular response   2. Congestive  heart failure   3. Anemia       MDM  Overall the patient appears in no acute distress but is having runs of what appears to be atrial flutter likely in a 2-1 block. It is a regular tachycardia that self resolves. She appears to be having approximately 32 seconds runs at a time. She does have an underlying right bundle-branch block and left anterior fascicular block so the wide-complex nature appears to be chronic making a ventricular tachycardia less likely.  EKG has been able to  catch these runs, there is no signs of significant ST or T wave changes to suggest ischemia. At this time laboratory evaluation has begun with electrolytes, renal function, blood counts, troponin, coagulation profile, chest x-ray  ED ECG REPORT   Date: 03/27/2011 0413 am  Rate: 158  Rhythm: atrial flutter  QRS Axis: left  Intervals: Atrial paced  ST/T Wave abnormalities: nonspecific ST/T changes  Conduction Disutrbances:right bundle branch block, left anterior fascicular block and Left ventricular hypertrophy  Narrative Interpretation:   Old EKG Reviewed: changes noted today has tachycardia   Patient record reviewed, apparently patient had tachybradycardia syndrome treated at 2006 with pacemaker.  Patient remains to have frequent intermittent atrial flutter with 21 block and rapid ventricular response. She does have intermittent episodes of dyspnea and lightheadedness. Her blood results show anemia, not down on prior labs. INR is 1.19, subtherapeutic given that she is taking Coumadin. Electrolytes show no significant abnormalities including renal function. Chest x-ray does show moderate congestive heart failure.  I have discussed the care with the cardiologist Dr. Mayford Knife who has accepted the patient in transfer to [facility] [place] telemetry bed. I have started heparin as an IV drip for coagulation, diltiazem bolus for rate control. I have discussed the findings with the [employer] carelink expressive service who  have interrogated the pacemaker. He states that she has been in a state of atrial flutter or atrial fibrillation intermittently significant amounts of time up to 6 hours at a time over the last week. They deny any other signs of ventricular paced tachycardia.  CRITICAL CARE Performed by: Vida Roller   Total critical care time: 35  Critical care time was exclusive of separately billable procedures and treating other patients.  Critical care was necessary to treat or prevent imminent or life-threatening deterioration.  Critical care was time spent personally by me on the following activities: development of treatment plan with patient and/or surrogate as well as nursing, discussions with consultants, evaluation of patient's response to treatment, examination of patient, obtaining history from patient or surrogate, ordering and performing treatments and interventions, ordering and review of laboratory studies, ordering and review of radiographic studies, pulse oximetry and re-evaluation of patient's condition.   Vida Roller, MD 03/27/11 [id number]

## 2011-03-27 NOTE — ED Notes (Signed)
Pt ambulated to the bathroom with no assistance. Pt back in bed and monitor was placed back on. Family at bedside.NAD noted at this time.

## 2011-03-27 NOTE — ED Notes (Signed)
Pt states that she woke up feeling SOB. Pt reports that she was told since she has a pace maker to come to the hospital whenever she feels SOB. Pt 91% on room air. Pt placed on 2 liters of O2 via nasal cannula. Pt reports feeling nauseous, denies any vomiting at this time. Breath sounds clear and regular. Positive bowel sounds in all four quadrants. Pt alert and oriented at this time.

## 2011-03-27 NOTE — ED Notes (Signed)
Attempted to call patient's on Timothy per the patient's request to notify him of patient being transferred to [facility].

## 2011-03-27 NOTE — ED Notes (Signed)
Pt awoke feeling sob, states d/t her pacemaker she was told to be checked for sob.

## 2011-03-27 NOTE — H&P (Addendum)
Admit date: 03/27/2011 Referring Physician Dr. Eber Hong Primary Cardiologist Dr. Everette Rank Chief complaint/reason for admission:  CHF/atrial flutter with RVR  HPI: :This is an 78 yo female with a history of HTN, recurrent atrial fibrillation and atrial flutter with history of cardioversion and PPM placement for tachybrady syndrome in 2006 who was in her USOH until last PM when she developed SOB that awoke her from sleep.  She felt very weak.  She denied any chest pain, nausea, vomiting, LE edema.  She did notice palpitations.  In ER she was noted to have runs of atrial flutter with 2:1 block.  She was also noted to have anemia which is new.  She is on chronic anticoagulation.  Cxray showed moderate CHF.  She was started on IV Heparin for subtherapeutic INR, Cardizem gtt and is now transferred to [facility] for further care.      PMH:    Past Medical History  Diagnosis Date  . Paroxysmal atrial fibrillation/flutter   . Diastolic heart failure   . Ovarian cancer   . Hypertension   . Tachy-brady syndrome s/p PPM   . CHF (congestive heart failure)     diastolic heart failure    PSH:    Past Surgical History  Procedure Date  . Cardiac pacemaker placement   . Abdominal hysterectomy     ALLERGIES:   Lasix; Penicillins; and Sulfa antibiotics  Prior to Admit Meds:   Prescriptions prior to admission  Medication Sig Dispense Refill  . calcium-vitamin D (OSCAL WITH D) 500-200 MG-UNIT per tablet Take 1 tablet by mouth 2 (two) times daily.      . ciprofloxacin (CIPRO) 500 MG tablet Take 500 mg by mouth 2 (two) times daily.      Marland Kitchen lubiprostone (AMITIZA) 24 MCG capsule Take 24 mcg by mouth 2 (two) times daily with a meal.        . polysaccharide iron (NIFEREX) 150 MG CAPS capsule Take 150 mg by mouth every morning.       . ramipril (ALTACE) 10 MG capsule Take 10 mg by mouth daily.        . rifampin (RIFADIN) 300 MG capsule Take 300 mg by mouth 2 (two) times daily.      . sotalol (BETAPACE) 120 MG  tablet Take 120 mg by mouth 2 (two) times daily.        Marland Kitchen warfarin (COUMADIN) 5 MG tablet Take 2.5-5 mg by mouth as directed. Take 2.5 mg (half a tablet) on thursdays, all other days take 5 mg (1 tablet)      . denosumab (PROLIA) 60 MG/ML SOLN Inject 60 mg into the skin every 6 (six) months.        Family HX:   History reviewed. No pertinent family history. Social HX:    History   Social History  . Marital Status: Widowed    Spouse Name: N/A    Number of Children: N/A  . Years of Education: N/A   Occupational History  . Not on file.   Social History Main Topics  . Smoking status: Never Smoker   . Smokeless tobacco: Never Used  . Alcohol Use: No  . Drug Use: No  . Sexually Active: No   Other Topics Concern  . Not on file   Social History Narrative  . No narrative on file     ROS:  All 11 ROS were addressed and are negative except what is stated in the HPI  PHYSICAL EXAM Filed Vitals:  03/27/11 1406  BP: 147/89  Pulse: 111  Temp: 98.3 F (36.8 C)  Resp: 22   General: Well developed, well nourished, in no acute distress Head: Eyes PERRLA, No xanthomas.   Normal cephalic and atramatic  Lungs:  Crackles at bases bilaterally. Heart:   Irregularly irregular, no murmurs/rubs or gallops            No carotid bruit. No JVD.  No abdominal bruits. No femoral bruits. Abdomen: Bowel sounds are positive, abdomen soft and non-tender without masses Extremities:   No clubbing, cyanosis or edema.  DP +1 Neuro: Alert and oriented X 3. Psych:  Good affect, responds appropriately   Labs:   Lab Results  Component Value Date   WBC 10.0 03/27/2011   HGB 10.0* 03/27/2011   HCT 33.2* 03/27/2011   MCV 83.0 03/27/2011   PLT 248 03/27/2011    Lab 03/27/11 0442  NA 137  K 4.0  CL 103  CO2 27  BUN 15  CREATININE 0.76  CALCIUM 9.3  PROT --  BILITOT --  ALKPHOS --  ALT --  AST --  GLUCOSE 185*   Lab Results  Component Value Date   TROPONINI <0.30 03/27/2011   No results  found for this basename: PTT   Lab Results  Component Value Date   INR 1.19 03/27/2011   INR 2.0* 05/28/2010       Radiology: *RADIOLOGY REPORT*  Clinical Data: Short of breath  PORTABLE CHEST - 1 VIEW  Comparison: 05/29/2004.  Findings: Cardiomegaly. Monitoring leads are projected over the  chest. Dual lead left subclavian cardiac pacemaker. Interstitial  and mild alveolar pulmonary edema. Basilar atelectasis. Small  bilateral pleural effusions.  IMPRESSION:  Mild to moderate CHF.  Original Report Authenticated By: Andreas Newport, M.D.   EKG:  A paced rhythm with RBBB  ASSESSMENT:  1.  Paroxysmal atrial flutter with 2:1 block and RVR 2.  New onset anemia 3.  Systemic anticoagulation with sub therapeutic INR 4.  Acute on chronic diastolic heart failure 5.  H/O Ovarian CA 6.  Hypertension 7.  Tachybrady syndrome s/p PPM   PLAN:   1.  Admit to telemetry bed 2.  Cycle cardiac enzymes 3.  IV Cardizem gtt for rate control 4.  2D echo to assess LV function 5.  Check TSH 6.  GI consult for anemia given her need to be anticoagulated with PAF and CHADS-VASC score of 2 7.  Increase Sotolol to 160mg  BID 8.  Daily EKG to follow QTC 9.  Discussed with pharmacy - she has an allergy to Lasix which causes swelling so will use Ethacrynic acid instead for diuresis 10.  BMET and BNP in am  Quintella Reichert, MD  03/27/2011  2:50 PM

## 2011-03-27 NOTE — ED Notes (Signed)
MD at bedside. 

## 2011-03-27 NOTE — Progress Notes (Signed)
ANTICOAGULATION CONSULT NOTE - Initial Consult  Pharmacy Consult for: Heparin/Coumadin Indication: atrial fibrillation  Allergies  Allergen Reactions  . Lasix (Furosemide) Swelling  . Penicillins Swelling    Mouth swelling and redness  . Sulfa Antibiotics Rash    Patient Measurements: Height: 5\' 7"  (170.2 cm) Weight: 189 lb 6 oz (85.9 kg) IBW/kg (Calculated) : 61.6    Vital Signs: Temp: 98.3 F (36.8 C) (01/11 1406) Temp src: Oral (01/11 1406) BP: 147/89 mmHg (01/11 1406) Pulse Rate: 111  (01/11 1406)  Labs:  Basename 03/27/11 1140 03/27/11 0442  HGB -- 10.0*  HCT -- 33.2*  PLT -- 248  APTT -- 28  LABPROT -- 15.4*  INR -- 1.19  HEPARINUNFRC 0.20* --  CREATININE -- 0.76  CKTOTAL -- --  CKMB -- --  TROPONINI -- <0.30   Estimated Creatinine Clearance: 56.8 ml/min (by C-G formula based on Cr of 0.76).  Medical History: Past Medical History  Diagnosis Date  . Irregular heart rhythm   . Diastolic heart failure   . Shortness of breath   . Ovarian cancer   . Ovarian cancer   . Hypertension   . Atrial fibrillation   . Tachy-brady syndrome   . CHF (congestive heart failure)     diastolic heart failure    Medications:  Prescriptions prior to admission  Medication Sig Dispense Refill  . calcium-vitamin D (OSCAL WITH D) 500-200 MG-UNIT per tablet Take 1 tablet by mouth 2 (two) times daily.      . ciprofloxacin (CIPRO) 500 MG tablet Take 500 mg by mouth 2 (two) times daily.      Marland Kitchen lubiprostone (AMITIZA) 24 MCG capsule Take 24 mcg by mouth 2 (two) times daily with a meal.        . polysaccharide iron (NIFEREX) 150 MG CAPS capsule Take 150 mg by mouth every morning.       . ramipril (ALTACE) 10 MG capsule Take 10 mg by mouth daily.        . rifampin (RIFADIN) 300 MG capsule Take 300 mg by mouth 2 (two) times daily.      . sotalol (BETAPACE) 120 MG tablet Take 120 mg by mouth 2 (two) times daily.        Marland Kitchen warfarin (COUMADIN) 5 MG tablet Take 2.5-5 mg by mouth as  directed. Take 2.5 mg (half a tablet) on thursdays, all other days take 5 mg (1 tablet)      . denosumab (PROLIA) 60 MG/ML SOLN Inject 60 mg into the skin every 6 (six) months.        Assessment: 88yof on coumadin pta (5mg  daily except 2.5mg  on Thursday) for afib. INR on admission is below goal at 1.19. Of note, patient had been on rifampin for ~2 weeks and completed her course of therapy yesterday. Heparin drip started for subtherapeutic INR. 6h level obtained = 0.20 after 5000 unit bolus and 800 units/hr. RPh at [facility] increased to 1000 units/hr.   Goal of Therapy:  INR 2-3 Heparin level 0.3-0.7   Plan:  1) Continue heparin at 1000 units/hr for now 2) Check an 8h heparin level (~2100 tonight) 3) Coumadin 7.5mg  x 1 4) Daily INR, heparin level, CBC  Fredrik Rigger 03/27/2011,3:47 PM

## 2011-03-27 NOTE — ED Notes (Signed)
Checked with Bed Control about bed status for Pt.  They said still waiting on available Tele bed.  Nurse informed.

## 2011-03-27 NOTE — ED Notes (Addendum)
MD at bedside. 

## 2011-03-27 NOTE — Progress Notes (Signed)
Pharmacy: Heparin  88yof on heparin for subtherapeutic INR in setting of afib. Heparin level of 0.14 is below goal on 1000 units/hr.  Plan: 1) Re-bolus 2500 units x 1 then increase heparin drip to 1250 units/hr 2) 8 hour heparin level

## 2011-03-28 ENCOUNTER — Other Ambulatory Visit: Payer: Self-pay

## 2011-03-28 LAB — HEPARIN LEVEL (UNFRACTIONATED): Heparin Unfractionated: 0.27 IU/mL — ABNORMAL LOW (ref 0.30–0.70)

## 2011-03-28 LAB — CBC
HCT: 33.3 % — ABNORMAL LOW (ref 36.0–46.0)
MCH: 24.9 pg — ABNORMAL LOW (ref 26.0–34.0)
MCHC: 30.3 g/dL (ref 30.0–36.0)
MCV: 82 fL (ref 78.0–100.0)
Platelets: 212 10*3/uL (ref 150–400)
RDW: 25.5 % — ABNORMAL HIGH (ref 11.5–15.5)

## 2011-03-28 LAB — FERRITIN: Ferritin: 103 ng/mL (ref 10–291)

## 2011-03-28 LAB — CARDIAC PANEL(CRET KIN+CKTOT+MB+TROPI)
CK, MB: 2.6 ng/mL (ref 0.3–4.0)
CK, MB: 2.8 ng/mL (ref 0.3–4.0)
Total CK: 66 U/L (ref 7–177)
Troponin I: <0.3 ng/mL (ref <0.30)

## 2011-03-28 LAB — IRON AND TIBC
Saturation Ratios: 11 % — ABNORMAL LOW (ref 20–55)
TIBC: 298 ug/dL (ref 250–470)

## 2011-03-28 LAB — BASIC METABOLIC PANEL
BUN: 13 mg/dL (ref 6–23)
CO2: 29 mEq/L (ref 19–32)
Calcium: 9 mg/dL (ref 8.4–10.5)
Chloride: 105 mEq/L (ref 96–112)
Creatinine, Ser: 0.85 mg/dL (ref 0.50–1.10)
Glucose, Bld: 98 mg/dL (ref 70–99)

## 2011-03-28 MED ORDER — DILTIAZEM HCL ER COATED BEADS 180 MG PO CP24
180.0000 mg | ORAL_CAPSULE | Freq: Every day | ORAL | Status: DC
  Administered 2011-03-28 – 2011-04-02 (×6): 180 mg via ORAL
  Filled 2011-03-28 (×7): qty 1

## 2011-03-28 MED ORDER — ETHACRYNATE SODIUM 50 MG IV SOLR
50.0000 mg | Freq: Every day | INTRAVENOUS | Status: DC
  Administered 2011-03-28: 50 mg via INTRAVENOUS
  Filled 2011-03-28 (×3): qty 50

## 2011-03-28 MED ORDER — ETHACRYNATE SODIUM 50 MG IV SOLR
50.0000 mg | Freq: Every day | INTRAVENOUS | Status: DC
  Filled 2011-03-28: qty 50

## 2011-03-28 MED ORDER — BISACODYL 5 MG PO TBEC
10.0000 mg | DELAYED_RELEASE_TABLET | Freq: Once | ORAL | Status: AC
  Administered 2011-03-29: 10 mg via ORAL
  Filled 2011-03-28: qty 2

## 2011-03-28 MED ORDER — WARFARIN SODIUM 7.5 MG PO TABS
7.5000 mg | ORAL_TABLET | Freq: Once | ORAL | Status: AC
  Administered 2011-03-28: 7.5 mg via ORAL
  Filled 2011-03-28: qty 1

## 2011-03-28 MED ORDER — PEG 3350-KCL-NA BICARB-NACL 420 G PO SOLR
4000.0000 mL | Freq: Once | ORAL | Status: AC
  Administered 2011-03-29: 4000 mL via ORAL
  Filled 2011-03-28: qty 4000

## 2011-03-28 NOTE — Progress Notes (Signed)
SUBJECTIVE:  No complaints today  OBJECTIVE:   Vitals:   Filed Vitals:   03/27/11 1234 03/27/11 1406 03/27/11 2216 03/28/11 0500  BP: 147/92 147/89 141/81 109/72  Pulse: 109 111 95 65  Temp:  98.3 F (36.8 C) 97.8 F (36.6 C) 97.4 F (36.3 C)  TempSrc:  Oral Oral   Resp: 25 22 20 18   Height:  5\' 7"  (1.702 m)    Weight:  85.9 kg (189 lb 6 oz)  85.3 kg (188 lb 0.8 oz)  SpO2: 96% 94% 94% 93%   I&O's:   Intake/Output Summary (Last 24 hours) at 03/28/11 0915 Last data filed at 03/28/11 0853  Gross per 24 hour  Intake 811.47 ml  Output   1302 ml  Net -490.53 ml   TELEMETRY: Reviewed telemetry pt in atrial fibrillation   PHYSICAL EXAM General: Well developed, well nourished, in no acute distress Head: Eyes PERRLA, No xanthomas.   Normal cephalic and atramatic  Lungs:   Clear bilaterally to auscultation and percussion. Heart:   Irregularly irregular S1 S2 Pulses are 2+ & equal.            No carotid bruit. No JVD.  No abdominal bruits. No femoral bruits. Abdomen: Bowel sounds are positive, abdomen soft and non-tender without masses or                  Hernia's noted. Msk:  Back normal, normal gait. Normal strength and tone for age. Extremities:   No clubbing, cyanosis or edema.  DP +1 Neuro: Alert and oriented X 3. Psych:  Good affect, responds appropriately   LABS: Basic Metabolic Panel:  Basename 03/28/11 0510 03/27/11 1617  NA 141 139  K 3.5 3.5  CL 105 104  CO2 29 27  GLUCOSE 98 94  BUN 13 12  CREATININE 0.85 0.67  CALCIUM 9.0 9.2  MG -- 2.1  PHOS -- --   Liver Function Tests:  [facility] 03/27/11 1617  AST 28  ALT 22  ALKPHOS 50  BILITOT 0.4  PROT 6.4  ALBUMIN 3.0*    CBC:  Basename 03/28/11 0510 03/27/11 0442  WBC 5.1 10.0  NEUTROABS -- 8.4*  HGB 10.1* 10.0*  HCT 33.3* 33.2*  MCV 82.0 83.0  PLT 212 248   Cardiac Enzymes:  Basename 03/28/11 0732 03/27/11 2316 03/27/11 1601  CKTOTAL 66 67 74  CKMB 2.8 2.6 2.6  CKMBINDEX -- -- --    TROPONINI <0.30 <0.30 <0.30   Thyroid Function Tests:  Basename 03/27/11 1617  TSH 0.870  T4TOTAL --  T3FREE --  THYROIDAB --    Coag Panel:   Lab Results  Component Value Date   INR 1.35 03/28/2011   INR 1.22 03/27/2011   INR 1.19 03/27/2011    RADIOLOGY: Dg Chest Port 1 View  03/27/2011  *RADIOLOGY REPORT*  Clinical Data: Short of breath  PORTABLE CHEST - 1 VIEW  Comparison: 05/29/2004.  Findings: Cardiomegaly. Monitoring leads are projected over the chest.  Dual lead left subclavian cardiac pacemaker.  Interstitial and mild alveolar pulmonary edema.  Basilar atelectasis.  Small bilateral pleural effusions.  IMPRESSION: Mild to moderate CHF.  Original Report Authenticated By: Andreas Newport, M.D.      ASSESSMENT:  1. Paroxysmal atrial flutter/fibrillation with  RVR - on IV Cardizem gtt and Sotolol increased to 160mg  BID.  She was having periods of A paced rhythm with intermittent PAF yesterday but has been in afib all night with controlled heart rate. 2. New onset anemia  3. Systemic anticoagulation with sub therapeutic INR now on IV Heparin gtt 4. Acute on chronic diastolic heart failure with allergy to Lasix - lungs clear today after receiving Ethacrynic acid as diuretic 5. H/O Ovarian CA  6. Hypertension  7. Tachybrady syndrome s/p PPM   PLAN:   1.  Continue IV diuretics 2.  Check BNP and BMET in am 3.  GI consult today for anemia 4.  Check iron panel and ferritin, heme check stools 5.  Start Cardizem CD 180mg  daily and d/c cardizem gtt 6.  Continue IV heparin while reloading coumadin 7.  Continue Betapace  Quintella Reichert, MD  03/28/2011  9:15 AM

## 2011-03-28 NOTE — Consult Note (Signed)
[facility] Gastroenterology Consultation Note  Referring Provider:  Dr. Carolanne Grumbling  Reason for Consultation:  anemia  HPI: [name] is a 78 y.o. female recently admitted for symptomatic atrial fibrillation with rapid ventricular response.  We are asked to see her for evaluation of anemia, which is of relatively recent onset.  She has no nausea, vomiting, abdominal pain, overt blood in stool, loss-of-appetite or unintentional weight loss.  She has chronic constipation requiring over-the-counter stool softeners.  She has never had a colonoscopy or endoscopy to her recollection.  There is no known family history of colon cancer or polyps.   Past Medical History  Diagnosis Date  . Irregular heart rhythm   . Diastolic heart failure   . Shortness of breath   . Ovarian cancer   . Ovarian cancer   . Hypertension   . Atrial fibrillation   . Tachy-brady syndrome   . CHF (congestive heart failure)     diastolic heart failure    Past Surgical History  Procedure Date  . Cardiac pacemaker placement   . Abdominal hysterectomy     Prior to Admission medications   Medication Sig Start Date End Date Taking? Authorizing Provider  calcium-vitamin D (OSCAL WITH D) 500-200 MG-UNIT per tablet Take 1 tablet by mouth 2 (two) times daily.   Yes Historical Provider, MD  ciprofloxacin (CIPRO) 500 MG tablet Take 500 mg by mouth 2 (two) times daily.   Yes Historical Provider, MD  lubiprostone (AMITIZA) 24 MCG capsule Take 24 mcg by mouth 2 (two) times daily with a meal.     Yes Historical Provider, MD  polysaccharide iron (NIFEREX) 150 MG CAPS capsule Take 150 mg by mouth every morning.    Yes Historical Provider, MD  ramipril (ALTACE) 10 MG capsule Take 10 mg by mouth daily.     Yes Historical Provider, MD  rifampin (RIFADIN) 300 MG capsule Take 300 mg by mouth 2 (two) times daily.   Yes Historical Provider, MD  sotalol (BETAPACE) 120 MG tablet Take 120 mg by mouth 2 (two) times daily.     Yes Historical  Provider, MD  warfarin (COUMADIN) 5 MG tablet Take 2.5-5 mg by mouth as directed. Take 2.5 mg (half a tablet) on thursdays, all other days take 5 mg (1 tablet)   Yes Historical Provider, MD  denosumab (PROLIA) 60 MG/ML SOLN Inject 60 mg into the skin every 6 (six) months.     Historical Provider, MD    Current Facility-Administered Medications  Medication Dose Route Frequency Provider Last Rate Last Dose  . 0.9 %  sodium chloride infusion  250 mL Intravenous PRN Quintella Reichert, MD      . acetaminophen (TYLENOL) tablet 650 mg  650 mg Oral Q4H PRN Quintella Reichert, MD      . calcium-vitamin D (OSCAL WITH D) 500-200 MG-UNIT per tablet 1 tablet  1 tablet Oral BID Quintella Reichert, MD   1 tablet at 03/28/11 0958  . diltiazem (CARDIZEM CD) 24 hr capsule 180 mg  180 mg Oral Daily Quintella Reichert, MD   180 mg at 03/28/11 1028  . ethacrynic acid (EDECRIN) 50 mg in dextrose 5 % 50 mL IVPB  50 mg Intravenous Once Corky Crafts, MD   50 mg at 03/27/11 1719  . ethacrynic acid (EDECRIN) 50 mg in dextrose 5 % 50 mL IVPB  50 mg Intravenous Daily Quintella Reichert, MD      . heparin ADULT infusion 100 units/mL (25000  units/250 mL)  1,350 Units/hr Intravenous Continuous Gary Fleet Abbott, PHARMD 13.5 mL/hr at 03/28/11 0614 1,350 Units/hr at 03/28/11 0614  . heparin bolus via infusion 2,500 Units  2,500 Units Intravenous Once Hessie Diener Arco, PHARMD   2,500 Units at 03/27/11 2300  . lubiprostone (AMITIZA) capsule 24 mcg  24 mcg Oral BID WC Quintella Reichert, MD   24 mcg at 03/28/11 0833  . ondansetron (ZOFRAN) injection 4 mg  4 mg Intravenous Q6H PRN Quintella Reichert, MD      . polysaccharide iron (NIFEREX) capsule 150 mg  150 mg Oral Q0700 Quintella Reichert, MD   150 mg at 03/28/11 0615  . ramipril (ALTACE) capsule 10 mg  10 mg Oral Daily Quintella Reichert, MD   10 mg at 03/28/11 0958  . sodium chloride 0.9 % injection 3 mL  3 mL Intravenous Q12H Traci R Turner, MD      . sodium chloride 0.9 % injection 3 mL  3 mL  Intravenous PRN Quintella Reichert, MD      . sotalol (BETAPACE) tablet 160 mg  160 mg Oral BID Quintella Reichert, MD   80 mg at 03/28/11 0958  . warfarin (COUMADIN) tablet 7.5 mg  7.5 mg Oral ONCE-1800 Hessie Diener Ruffin, PHARMD   7.5 mg at 03/27/11 1719  . warfarin (COUMADIN) tablet 7.5 mg  7.5 mg Oral ONCE-1800 Gary Fleet Abbott, [place]      . DISCONTD: diltiazem (CARDIZEM) 100 mg in dextrose 5 % 100 mL infusion  5 mg/hr Intravenous Titrated Quintella Reichert, MD 5 mL/hr at 03/28/11 0230 5 mg/hr at 03/28/11 0230  . DISCONTD: ethacrynic acid (EDECRIN) injection 50 mg  50 mg Intravenous Once Quintella Reichert, MD      . DISCONTD: ethacrynic acid (EDECRIN) injection 50 mg  50 mg Intravenous Daily Quintella Reichert, MD      . DISCONTD: heparin ADULT infusion 100 units/mL (25000 units/250 mL)  12 Units/kg/hr (Adjusted) Intravenous Continuous Vida Roller, MD 8 mL/hr at 03/27/11 0606 12 Units/kg/hr at 03/27/11 0606  . DISCONTD: heparin ADULT infusion 100 units/mL (25000 units/250 mL)  15 Units/kg/hr (Adjusted) Intravenous Continuous Caryl Asp, PHARMD 10 mL/hr at 03/27/11 2219 15 Units/kg/hr at 03/27/11 2219  . DISCONTD: heparin bolus via infusion 1,000 Units  1,000 Units Intravenous Once Francisco J Valls, PHARMD        Allergies as of 03/27/2011 - Review Complete 03/27/2011  Allergen Reaction Noted  . Lasix (furosemide) Swelling 05/31/2010  . Penicillins Swelling 05/31/2010  . Sulfa antibiotics Rash 05/31/2010    History reviewed. No pertinent family history.  History   Social History  . Marital Status: Widowed    Spouse Name: N/A    Number of Children: N/A  . Years of Education: N/A   Occupational History  . Not on file.   Social History Main Topics  . Smoking status: Never Smoker   . Smokeless tobacco: Never Used  . Alcohol Use: No  . Drug Use: No  . Sexually Active: No   Other Topics Concern  . Not on file   Social History Narrative  . No narrative on file    Review of  Systems: Positive for: palpitations, fatigue, weakness, malaise, dyspnea Gen: Denies any fever, chills, rigors, night sweats, anorexia,, involuntary weight loss, and sleep disorder CV: Denies chest pain, angina, , syncope, orthopnea, PND, peripheral edema, and claudication. Resp: Denies , cough, sputum, wheezing, coughing up blood. GI: Described in detail in  HPI.    GU : Denies urinary burning, blood in urine, urinary frequency, urinary hesitancy, nocturnal urination, and urinary incontinence. MS: Denies joint pain or swelling.  Denies muscle weakness, cramps, atrophy.  Derm: Denies rash, itching, oral ulcerations, hives, unhealing ulcers.  Psych: Denies depression, anxiety, memory loss, suicidal ideation, hallucinations,  and confusion. Heme: Denies bruising, bleeding, and enlarged lymph nodes. Neuro:  Denies any headaches, dizziness, paresthesias. Endo:  Denies any problems with DM, thyroid, adrenal function.  Physical Exam: Vital signs in last 24 hours: Temp:  [97.4 F (36.3 C)-98.3 F (36.8 C)] 97.4 F (36.3 C) (01/12 0500) Pulse Rate:  [65-111] 65  (01/12 0500) Resp:  [18-25] 18  (01/12 0500) BP: (109-147)/(72-92) 109/72 mmHg (01/12 0500) SpO2:  [93 %-96 %] 93 % (01/12 0500) Weight:  [85.3 kg (188 lb 0.8 oz)-85.9 kg (189 lb 6 oz)] 85.3 kg (188 lb 0.8 oz) (01/12 0500) Last BM Date: 03/26/11 General:   Alert, overweight, much younger-appearing than stated age, non-toxic appearing Head:  Normocephalic and atraumatic. Eyes:  Sclera clear, no icterus.   Conjunctiva pink. Ears:  Normal auditory acuity. Nose:  No deformity, discharge,  or lesions. Mouth:  No deformity or lesions.  Oropharynx pink & moist. Neck:  Supple; no masses or thyromegaly. Lungs:  Clear throughout to auscultation.   No wheezes, crackles, or rhonchi. No acute distress. Heart:  Normal rate but irregularly irregular rhythm; no murmurs, clicks, rubs,  or gallops. Abdomen:  Soft, nontender and nondistended. No  masses, hepatosplenomegaly or hernias noted. Normal bowel sounds, without guarding, and without rebound.     Msk:  Symmetrical without gross deformities. Normal posture. Pulses:  Normal pulses noted. Extremities:  Without clubbing or edema. Neurologic:  Alert and  oriented x4;  grossly normal neurologically. Skin:  Scattered mild ecchymoses, otherwise intact without significant lesions or rashes. Psych:  Alert and cooperative. Normal mood and affect.   Lab Results:  Basename 03/28/11 0510 03/27/11 0442  WBC 5.1 10.0  HGB 10.1* 10.0*  HCT 33.3* 33.2*  PLT 212 248   BMET  Basename 03/28/11 0510 03/27/11 1617 03/27/11 0442  NA 141 139 137  K 3.5 3.5 4.0  CL 105 104 103  CO2 29 27 27   GLUCOSE 98 94 185*  BUN 13 12 15   CREATININE 0.85 0.67 0.76  CALCIUM 9.0 9.2 9.3   LFT  Basename 03/27/11 1617  PROT 6.4  ALBUMIN 3.0*  AST 28  ALT 22  ALKPHOS 50  BILITOT 0.4  BILIDIR --  IBILI --   PT/INR  Basename 03/28/11 0510 03/27/11 1617  LABPROT 16.9* 15.7*  INR 1.35 1.22    Studies/Results:  Impression:  1.  Anemia, unclear etiology, with anticipated need for longterm anticoagulation. 2.  Constipation, chronic.  Plan:  1.  Clear liquids and bowel preparation tomorrow. 2.  Anticipate colonoscopy +/- endoscopy on Monday 1/14. 3.  Will hold heparin gtt 4-6 hours pre endoscopic procedures. 4.  Will follow.  Thank you for the consultation.   LOS: 1 day   Keron Neenan M  03/28/2011, 11:02 AM

## 2011-03-28 NOTE — Progress Notes (Signed)
  Echocardiogram 2D Echocardiogram has been performed.  [name] 03/28/2011, 9:04 AM

## 2011-03-28 NOTE — Progress Notes (Signed)
ANTICOAGULATION CONSULT NOTE - Initial Consult  Pharmacy Consult for: Heparin/Coumadin Indication: atrial fibrillation  Allergies  Allergen Reactions  . Lasix (Furosemide) Swelling  . Penicillins Swelling    Mouth swelling and redness  . Sulfa Antibiotics Rash    Patient Measurements: Height: 5\' 7"  (170.2 cm) Weight: 188 lb 0.8 oz (85.3 kg) IBW/kg (Calculated) : 61.6    Vital Signs: Temp: 97.4 F (36.3 C) (01/12 0500) Temp src: Oral (01/11 2216) BP: 109/72 mmHg (01/12 0500) Pulse Rate: 65  (01/12 0500)  Labs:  Basename 03/28/11 0510 03/27/11 2316 03/27/11 2159 03/27/11 1617 03/27/11 1601 03/27/11 1140 03/27/11 0442  HGB 10.1* -- -- -- -- -- 10.0*  HCT 33.3* -- -- -- -- -- 33.2*  PLT 212 -- -- -- -- -- 248  APTT -- -- -- -- -- -- 28  LABPROT 16.9* -- -- 15.7* -- -- 15.4*  INR 1.35 -- -- 1.22 -- -- 1.19  HEPARINUNFRC 0.27* -- 0.14* -- -- 0.20* --  CREATININE -- -- -- 0.67 -- -- 0.76  CKTOTAL -- 67 -- -- 74 -- --  CKMB -- 2.6 -- -- 2.6 -- --  TROPONINI -- <0.30 -- -- <0.30 -- <0.30   Estimated Creatinine Clearance: 56.7 ml/min (by C-G formula based on Cr of 0.67).   Assessment: 78 yo female with h/o Afib for anticoagulation.  Prior treatment with rifampin noted.  Goal of Therapy:  INR 2-3 Heparin level 0.3-0.7   Plan:  Increase Heparin 1350 units/hr Coumadin 7.5 mg tonight.  Eddie Candle 03/28/2011,6:06 AM

## 2011-03-29 ENCOUNTER — Other Ambulatory Visit: Payer: Self-pay

## 2011-03-29 LAB — PROTIME-INR: INR: 1.25 (ref 0.00–1.49)

## 2011-03-29 LAB — CBC
HCT: 35 % — ABNORMAL LOW (ref 36.0–46.0)
Hemoglobin: 10.7 g/dL — ABNORMAL LOW (ref 12.0–15.0)
MCV: 82.2 fL (ref 78.0–100.0)
RDW: 25.4 % — ABNORMAL HIGH (ref 11.5–15.5)
WBC: 4.6 10*3/uL (ref 4.0–10.5)

## 2011-03-29 LAB — BASIC METABOLIC PANEL
BUN: 17 mg/dL (ref 6–23)
CO2: 29 mEq/L (ref 19–32)
Chloride: 105 mEq/L (ref 96–112)
Creatinine, Ser: 0.88 mg/dL (ref 0.50–1.10)
Potassium: 3.9 mEq/L (ref 3.5–5.1)

## 2011-03-29 MED ORDER — WARFARIN SODIUM 10 MG PO TABS
10.0000 mg | ORAL_TABLET | Freq: Once | ORAL | Status: DC
  Filled 2011-03-29: qty 1

## 2011-03-29 NOTE — Progress Notes (Signed)
ANTICOAGULATION CONSULT NOTE - Follow Up Consult  Pharmacy Consult for: Heparin/Coumadin  Indication: atrial fibrillation  Allergies  Allergen Reactions  . Lasix (Furosemide) Swelling  . Penicillins Swelling    Mouth swelling and redness  . Sulfa Antibiotics Rash    Patient Measurements: Height: 5\' 7"  (170.2 cm) Weight: 181 lb 14.1 oz (82.5 kg) IBW/kg (Calculated) : 61.6   Vital Signs: Temp: 98.2 F (36.8 C) (01/13 0500) BP: 117/79 mmHg (01/13 0500) Pulse Rate: 97  (01/13 0500)  Labs:  Basename 03/29/11 0530 03/28/11 0732 03/28/11 0510 03/27/11 2316 03/27/11 2159 03/27/11 1617 03/27/11 1601 03/27/11 0442  HGB 10.7* -- 10.1* -- -- -- -- --  HCT 35.0* -- 33.3* -- -- -- -- 33.2*  PLT 230 -- 212 -- -- -- -- 248  APTT -- -- -- -- -- -- -- 28  LABPROT 16.0* -- 16.9* -- -- 15.7* -- --  INR 1.25 -- 1.35 -- -- 1.22 -- --  HEPARINUNFRC 0.30 -- 0.27* -- 0.14* -- -- --  CREATININE 0.88 -- 0.85 -- -- 0.67 -- --  CKTOTAL -- 66 -- 67 -- -- 74 --  CKMB -- 2.8 -- 2.6 -- -- 2.6 --  TROPONINI -- <0.30 -- <0.30 -- -- <0.30 --   Estimated Creatinine Clearance: 50.7 ml/min (by C-G formula based on Cr of 0.88).   Medications:  Scheduled:    . bisacodyl  10 mg Oral Once  . calcium-vitamin D  1 tablet Oral BID  . diltiazem  180 mg Oral Daily  . ethadrynate sodium (EDECRIN) IVPB  50 mg Intravenous Daily  . lubiprostone  24 mcg Oral BID WC  . polyethylene glycol-electrolytes  4,000 mL Oral Once  . polysaccharide iron  150 mg Oral Q0700  . ramipril  10 mg Oral Daily  . sodium chloride  3 mL Intravenous Q12H  . sotalol  160 mg Oral BID  . warfarin  7.5 mg Oral ONCE-1800  . DISCONTD: ethacrynic acid  50 mg Intravenous Daily    Assessment: [name] with Afib on heparin and coumadin. Heparin level low end therapeutic, INR remains below goal. Patient finished 2 week course of Rifampin prior to admission, which could increase the metabolism of coumadin. Patient has new onset anemia, but her  hgb has been stable since admission, Iron level is low on PO iron supplement. No active bleeding noted per chart  Goal of Therapy:  INR 2-3  Heparin level 0.3-0.7   Plan:  - Increase heparin level slightly to 1400 units/hr to stay in therapeutic range - coumadin 10mg  po x 1 this evening - f/u INR and heparin level Monday morning   [name] 03/29/2011,7:45 AM

## 2011-03-29 NOTE — Progress Notes (Signed)
SUBJECTIVE:  Feels good, denies SOB  OBJECTIVE:   Vitals:   Filed Vitals:   03/28/11 0500 03/28/11 1400 03/28/11 2100 03/29/11 0500  BP: 109/72 107/63 112/68 117/79  Pulse: 65 111 73 97  Temp: 97.4 F (36.3 C) 98.1 F (36.7 C) 98.7 F (37.1 C) 98.2 F (36.8 C)  TempSrc:  Oral    Resp: 18 22 20 18   Height:      Weight: 85.3 kg (188 lb 0.8 oz)   82.5 kg (181 lb 14.1 oz)  SpO2: 93% 95% 94% 93%   I&O's:   Intake/Output Summary (Last 24 hours) at 03/29/11 1001 Last data filed at 03/29/11 2130  Gross per 24 hour  Intake    663 ml  Output   2941 ml  Net  -2278 ml   TELEMETRY: Reviewed telemetry pt in atrial fibrillation     PHYSICAL EXAM General: Well developed, well nourished, in no acute distress Head: Eyes PERRLA, No xanthomas.   Normal cephalic and atramatic  Lungs:   Few crackles that clear with cough Heart:   Irregularly irregular S1 S2 Pulses are 2+ & equal.            No carotid bruit. No JVD.  No abdominal bruits. No femoral bruits. Abdomen: Bowel sounds are positive, abdomen soft and non-tender without masses Extremities:   No clubbing, cyanosis or edema.  DP +1 Neuro: Alert and oriented X 3. Psych:  Good affect, responds appropriately   LABS: Basic Metabolic Panel:  Basename 03/29/11 0530 03/28/11 0510 03/27/11 1617  NA 141 141 --  K 3.9 3.5 --  CL 105 105 --  CO2 29 29 --  GLUCOSE 102* 98 --  BUN 17 13 --  CREATININE 0.88 0.85 --  CALCIUM 9.1 9.0 --  MG -- -- 2.1  PHOS -- -- --   Liver Function Tests:  [facility] 03/27/11 1617  AST 28  ALT 22  ALKPHOS 50  BILITOT 0.4  PROT 6.4  ALBUMIN 3.0*    CBC:  Basename 03/29/11 0530 03/28/11 0510 03/27/11 0442  WBC 4.6 5.1 --  NEUTROABS -- -- 8.4*  HGB 10.7* 10.1* --  HCT 35.0* 33.3* --  MCV 82.2 82.0 --  PLT 230 212 --   Cardiac Enzymes:  Basename 03/28/11 0732 03/27/11 2316 03/27/11 1601  CKTOTAL 66 67 74  CKMB 2.8 2.6 2.6  CKMBINDEX -- -- --  TROPONINI <0.30 <0.30 <0.30   Thyroid  Function Tests:  Basename 03/27/11 2159  TSH 1.563  T4TOTAL --  T3FREE --  THYROIDAB --   Anemia Panel:  Basename 03/28/11 1018  VITAMINB12 --  FOLATE --  FERRITIN 103  TIBC 298  IRON 34*  RETICCTPCT --   Coag Panel:   Lab Results  Component Value Date   INR 1.25 03/29/2011   INR 1.35 03/28/2011   INR 1.22 03/27/2011    RADIOLOGY: Dg Chest Port 1 View  03/27/2011  *RADIOLOGY REPORT*  Clinical Data: Short of breath  PORTABLE CHEST - 1 VIEW  Comparison: 05/29/2004.  Findings: Cardiomegaly. Monitoring leads are projected over the chest.  Dual lead left subclavian cardiac pacemaker.  Interstitial and mild alveolar pulmonary edema.  Basilar atelectasis.  Small bilateral pleural effusions.  IMPRESSION: Mild to moderate CHF.  Original Report Authenticated By: Andreas Newport, M.D.      ASSESSMENT:  1. Paroxysmal atrial flutter/fibrillation with RVR - on PO Cardizem and Sotolol 160mg  BID. She was having periods of A paced rhythm with intermittent PAF on day  of admit but currently is afib with controlled VR 2. New onset anemia - appreciate GI input - for colonoscopy tomorrow 3. Systemic anticoagulation with sub therapeutic INR now on IV Heparin gtt while reloading coumadin which was subtherapeutic on admit despite patient stating she takes it daily - Heparin to be held transiently for colonoscopy tomorrow 4. Acute on chronic diastolic heart failure with allergy to Lasix  - resolved 5. H/O Ovarian CA  6. Hypertension  7. Tachybrady syndrome s/p PPM   PLAN:   1.  Colonoscopy in am 2.  Continue coumadin load and IV Heparin gtt 3.  D/C IV Ethacrynic acid  Quintella Reichert, MD  03/29/2011  10:01 AM

## 2011-03-29 NOTE — Progress Notes (Signed)
EKG obtained prior to administration of evening dose of sotalol.  Qtc per EKG >500. MD notified.  Order given to hold medication for evening dose.  Will continue to monitor pt.

## 2011-03-29 NOTE — Progress Notes (Signed)
Subjective: No complaints.  No overt blood in stool.  Objective: Vital signs in last 24 hours: Temp:  [98.2 F (36.8 C)-98.7 F (37.1 C)] 98.2 F (36.8 C) (01/13 0500) Pulse Rate:  [73-97] 97  (01/13 0500) Resp:  [18-20] 18  (01/13 0500) BP: (112-117)/(68-79) 117/79 mmHg (01/13 0500) SpO2:  [93 %-94 %] 93 % (01/13 0500) Weight:  [82.5 kg (181 lb 14.1 oz)] 82.5 kg (181 lb 14.1 oz) (01/13 0500) Weight change: -3.4 kg (-7 lb 7.9 oz) Last BM Date: 03/28/11  PE: GEN:  NAD, much younger appearing than stated age. ABD:  Soft  Lab Results: CBC    Component Value Date/Time   WBC 4.6 03/29/2011 0530   WBC 6.1 12/26/2008 0943   RBC 4.26 03/29/2011 0530   RBC 4.25 12/26/2008 0943   HGB 10.7* 03/29/2011 0530   HGB 13.3 12/26/2008 0943   HCT 35.0* 03/29/2011 0530   HCT 38.8 12/26/2008 0943   PLT 230 03/29/2011 0530   PLT 196 12/26/2008 0943   MCV 82.2 03/29/2011 0530   MCV 91.3 12/26/2008 0943   MCH 25.1* 03/29/2011 0530   MCH 31.3 12/26/2008 0943   MCHC 30.6 03/29/2011 0530   MCHC 34.3 12/26/2008 0943   RDW 25.4* 03/29/2011 0530   RDW 13.4 12/26/2008 0943   LYMPHSABS 0.7 03/27/2011 0442   LYMPHSABS 1.0 12/26/2008 0943   MONOABS 0.9 03/27/2011 0442   MONOABS 0.5 12/26/2008 0943   EOSABS 0.0 03/27/2011 0442   EOSABS 0.0 12/26/2008 0943   BASOSABS 0.0 03/27/2011 0442   BASOSABS 0.0 12/26/2008 0943   Assessment:  1.  Microcytic anemia. 2.  Atrial fibrillation, with long-term anticipated need for anticoagulation.  Plan:  1.  Colonoscopy (+/- EGD) planned for tomorrow, Monday 03/30/11, at 8:30 a.m. 2.  Will hold heparin gtt 4:30 a.m., in anticipation of above endoscopic procedures. 3.  Will follow.   [name] 03/29/2011, 2:43 PM

## 2011-03-30 ENCOUNTER — Other Ambulatory Visit: Payer: Self-pay | Admitting: Gastroenterology

## 2011-03-30 ENCOUNTER — Other Ambulatory Visit: Payer: Self-pay

## 2011-03-30 ENCOUNTER — Encounter (HOSPITAL_COMMUNITY): Payer: Self-pay | Admitting: *Deleted

## 2011-03-30 ENCOUNTER — Encounter (HOSPITAL_COMMUNITY): Admission: EM | Disposition: A | Discharge status: Skilled Nursing Facility | Payer: Self-pay | Source: Home / Self Care

## 2011-03-30 DIAGNOSIS — K6389 Other specified diseases of intestine: Secondary | ICD-10-CM | POA: Diagnosis present

## 2011-03-30 DIAGNOSIS — C189 Malignant neoplasm of colon, unspecified: Secondary | ICD-10-CM

## 2011-03-30 HISTORY — PX: COLONOSCOPY: SHX5424

## 2011-03-30 HISTORY — PX: ESOPHAGOGASTRODUODENOSCOPY: SHX5428

## 2011-03-30 LAB — BASIC METABOLIC PANEL
CO2: 26 mEq/L (ref 19–32)
Chloride: 108 mEq/L (ref 96–112)
Creatinine, Ser: 0.78 mg/dL (ref 0.50–1.10)
GFR calc Af Amer: 85 mL/min — ABNORMAL LOW (ref >90)
Potassium: 3.7 mEq/L (ref 3.5–5.1)
Sodium: 142 mEq/L (ref 135–145)

## 2011-03-30 LAB — PROTIME-INR
INR: 1.34 (ref 0.00–1.49)
Prothrombin Time: 16.8 seconds — ABNORMAL HIGH (ref 11.6–15.2)

## 2011-03-30 LAB — CBC
HCT: 35.4 % — ABNORMAL LOW (ref 36.0–46.0)
Hemoglobin: 10.8 g/dL — ABNORMAL LOW (ref 12.0–15.0)
MCV: 81.9 fL (ref 78.0–100.0)
RBC: 4.32 MIL/uL (ref 3.87–5.11)
WBC: 3.4 10*3/uL — ABNORMAL LOW (ref 4.0–10.5)

## 2011-03-30 SURGERY — COLONOSCOPY
Anesthesia: Moderate Sedation

## 2011-03-30 MED ORDER — SODIUM CHLORIDE 0.9 % IV SOLN: Freq: Once | INTRAVENOUS | Status: DC

## 2011-03-30 MED ORDER — MIDAZOLAM HCL 10 MG/2ML IJ SOLN
INTRAMUSCULAR | Status: AC
  Filled 2011-03-30: qty 4

## 2011-03-30 MED ORDER — MIDAZOLAM HCL 10 MG/2ML IJ SOLN
INTRAMUSCULAR | Status: DC | PRN
  Administered 2011-03-30 (×4): 1 mg via INTRAVENOUS

## 2011-03-30 MED ORDER — BUTAMBEN-TETRACAINE-BENZOCAINE 2-2-14 % EX AERO
INHALATION_SPRAY | CUTANEOUS | Status: DC | PRN
  Administered 2011-03-30: 2 via TOPICAL

## 2011-03-30 MED ORDER — FENTANYL NICU IV SYRINGE 50 MCG/ML
INJECTION | INTRAMUSCULAR | Status: DC | PRN
  Administered 2011-03-30 (×4): 12.5 ug via INTRAVENOUS

## 2011-03-30 MED ORDER — SODIUM CHLORIDE 0.9 % IV SOLN
Freq: Once | INTRAVENOUS | Status: AC
  Administered 2011-03-30: 500 mL via INTRAVENOUS

## 2011-03-30 MED ORDER — FENTANYL CITRATE 0.05 MG/ML IJ SOLN
INTRAMUSCULAR | Status: AC
  Filled 2011-03-30: qty 4

## 2011-03-30 MED FILL — Diltiazem HCl IV For Soln 100 MG: INTRAVENOUS | Qty: 100 | Status: AC

## 2011-03-30 NOTE — Brief Op Note (Signed)
03/27/2011 - 03/30/2011  9:23 AM  PATIENT:  [name]  78 y.o. female  PRE-OPERATIVE DIAGNOSIS:  Anemia  POST-OPERATIVE DIAGNOSIS:  sigmoid colon mass; EGD unremarkable  PROCEDURE:  Procedure(s): COLONOSCOPY ESOPHAGOGASTRODUODENOSCOPY (EGD)  SURGEON:  Surgeon(s): Barrie Folk, MD  Please see formal procedure report for details.

## 2011-03-30 NOTE — Consult Note (Signed)
Reason for Consult: Sigmoid colon mass Consulting Surgeon: [name] Referring Physician: Madilyn [name]   HPI: [name] is an 78 y.o. female who presented with Afib and RVR but was found to be anemic. She has been worked up and a colonoscopy today finds a non-obstructing sigmoid mass, biopsy now pending. She denies any sxs of wt loss, hematochezia, melena, BPR. She had no prior colonoscopies. She's had some prior pelvic surgery for a large ovarian mass, that was benign according to the family, but she did undergo some chemotherapy "as a precaution". She has been prepped and Dr. Madilyn [name] is suspicious of malignancy and has requested surgery eval. She has a hx of chronic a.fib and is on Couamdin, but has been subtherapeutic.  Past Medical History:  Past Medical History  Diagnosis Date  . Irregular heart rhythm   . Diastolic heart failure   . Shortness of breath   . Ovarian cancer   . Ovarian cancer   . Hypertension   . Atrial fibrillation   . Tachy-brady syndrome   . CHF (congestive heart failure)     diastolic heart failure    Surgical History:  Past Surgical History  Procedure Date  . Cardiac pacemaker placement   . Abdominal hysterectomy   . Oophorectomy     Family History:  Family History  Problem Relation Age of Onset  . Anesthesia problems Neg Hx   . Hypotension Neg Hx   . Malignant hyperthermia Neg Hx   . Pseudochol deficiency Neg Hx     Social History:  reports that she has never smoked. She has never used smokeless tobacco. She reports that she does not drink alcohol or use illicit drugs.  Allergies:  Allergies  Allergen Reactions  . Lasix (Furosemide) Swelling  . Penicillins Swelling    Mouth swelling and redness  . Sulfa Antibiotics Rash    Medications: I have reviewed the patient's current medications., including Coumadin.  ROS: See HPI for pertinent findings, otherwise complete 10 system review negative.  Physical Exam: Blood pressure 147/66, pulse 95,  temperature 97.7 F (36.5 C), temperature source Oral, resp. rate 14, height 5\' 7"  (1.702 m), weight 82 kg (180 lb 12.4 oz), SpO2 97.00%.  General Appearance:  Alert, cooperative, no distress, appears stated age.  Head:  Normocephalic, without obvious abnormality, atraumatic  ENT: Unremarkable  Neck: Supple, symmetrical, trachea midline, no adenopathy, thyroid: not enlarged, symmetric, no tenderness/mass/nodules  Lungs:   Clear to auscultation bilaterally, no w/r/r, respirations unlabored without use of accessory muscles.  Chest Wall:  No tenderness or deformity  Heart:  Irregular rate and rhythm, S1, S2 normal, no murmur, rub or gallop. Carotids 2+ without bruit.  Abdomen:   Soft, non-tender, non distended. Bowel sounds hypoactive. Large midline surgical scar compatible with prior hx.  Genitalia:  Normal. No hernias  Rectal:  Deferred.  Extremities: Extremities normal, atraumatic, no cyanosis or edema  Pulses: 2+ and symmetric  Skin: Skin color, texture, turgor normal, no rashes or lesions  Neurologic: Normal affect, no gross deficits.     Labs: CBC  Basename 03/30/11 0500 03/29/11 0530  WBC 3.4* 4.6  HGB 10.8* 10.7*  HCT 35.4* 35.0*  PLT 232 230   MET  Basename 03/30/11 0500 03/29/11 0530  NA 142 141  K 3.7 3.9  CL 108 105  CO2 26 29  GLUCOSE 104* 102*  BUN 9 17  CREATININE 0.78 0.88  CALCIUM 8.6 9.1    Basename 03/27/11 1617  PROT 6.4  ALBUMIN 3.0*  AST 28  ALT 22  ALKPHOS 50  BILITOT 0.4  BILIDIR --  IBILI --  LIPASE --   PT/INR  Basename 03/30/11 0500 03/29/11 0530  LABPROT 16.8* 16.0*  INR 1.34 1.25    Assessment: Principal Problem:  *Atrial flutter with rapid ventricular response Active Problems:  Hypertension  Diastolic CHF, acute on chronic  Anemia  Colonic mass  Plan: Await biopsy, continue clears diet only. If positive biopsy, will need staging workup pre-op, including CEA, CT chest/abd/pelvis. Discussed with pt and family likelihood  of surgical resection. Cont to hold Coumadin. Dr. [name] to eval and decide timing of surgery. Thank you for consult.    [name] F 03/30/2011, 11:08 AM

## 2011-03-30 NOTE — Progress Notes (Signed)
SUBJECTIVE:  No chest pain or palpitations.  Tolerate a colonoscopy well.  She is anticipating surgery tomorrow.  OBJECTIVE:   Vitals:   Filed Vitals:   03/30/11 1000 03/30/11 1010 03/30/11 1206 03/30/11 1400  BP: 147/88 147/66 148/96 112/74  Pulse:    77  Temp:    98 F (36.7 C)  TempSrc:    Oral  Resp: 13 14  18   Height:      Weight:      SpO2: 100% 97%  94%   I&O's:   Intake/Output Summary (Last 24 hours) at 03/30/11 1757 Last data filed at 03/30/11 1010  Gross per 24 hour  Intake    236 ml  Output      0 ml  Net    236 ml   TELEMETRY: Reviewed telemetry pt in atrial fibrillation with controlled ventricular response.:     PHYSICAL EXAM General: Well developed, well nourished, in no acute distress Head:    Normal cephalic and atramatic  Lungs:   Clear bilaterally to auscultation. Heart:  Irregularly irregular. Abdomen: Bowel sounds are positive, abdomen soft and non-tender Msk:   Normal strength and tone for age.  Mild kyphosis   Extremities:   No edema.   Neuro: Alert and oriented X 3. Psych:  Good affect, responds appropriately   LABS: Basic Metabolic Panel:  Basename 03/30/11 0500 03/29/11 0530  NA 142 141  K 3.7 3.9  CL 108 105  CO2 26 29  GLUCOSE 104* 102*  BUN 9 17  CREATININE 0.78 0.88  CALCIUM 8.6 9.1  MG -- --  PHOS -- --   Liver Function Tests: No results found for this basename: AST:2,ALT:2,ALKPHOS:2,BILITOT:2,PROT:2,ALBUMIN:2 in the last 72 hours No results found for this basename: LIPASE:2,AMYLASE:2 in the last 72 hours CBC:  Basename 03/30/11 0500 03/29/11 0530  WBC 3.4* 4.6  NEUTROABS -- --  HGB 10.8* 10.7*  HCT 35.4* 35.0*  MCV 81.9 82.2  PLT 232 230   Cardiac Enzymes:  Basename 03/28/11 0732 03/27/11 2316  CKTOTAL 66 67  CKMB 2.8 2.6  CKMBINDEX -- --  TROPONINI <0.30 <0.30   BNP: No components found with this basename: POCBNP:3 D-Dimer: No results found for this basename: DDIMER:2 in the last 72 hours Hemoglobin  A1C: No results found for this basename: HGBA1C in the last 72 hours Fasting Lipid Panel: No results found for this basename: CHOL,HDL,LDLCALC,TRIG,CHOLHDL,LDLDIRECT in the last 72 hours Thyroid Function Tests:  [facility] 03/27/11 2159  TSH 1.563  T4TOTAL --  T3FREE --  THYROIDAB --   Anemia Panel:  Basename 03/28/11 1018  VITAMINB12 --  FOLATE --  FERRITIN 103  TIBC 298  IRON 34*  RETICCTPCT --   Coag Panel:   Lab Results  Component Value Date   INR 1.34 03/30/2011   INR 1.25 03/29/2011   INR 1.35 03/28/2011    RADIOLOGY: Dg Chest Port 1 View  03/27/2011  *RADIOLOGY REPORT*  Clinical Data: Short of breath  PORTABLE CHEST - 1 VIEW  Comparison: 05/29/2004.  Findings: Cardiomegaly. Monitoring leads are projected over the chest.  Dual lead left subclavian cardiac pacemaker.  Interstitial and mild alveolar pulmonary edema.  Basilar atelectasis.  Small bilateral pleural effusions.  IMPRESSION: Mild to moderate CHF.  Original Report Authenticated By: Andreas Newport, M.D.      ASSESSMENT: Atrial fibrillation, possible colon cancer   PLAN:  Continue sotalol.  She is off of Coumadin due to the anticipation of surgery.  She is currently not receiving heparin either.  Echocardiogram showed normal left ventricular function.  No further cardiac testing is needed prior to surgery for colon cancer.  Will follow along.  Heart failure symptoms were likely from diastolic dysfunction related to A. fib with rapid ventricular response.  She has improved significantly.    Corky Crafts., MD  03/30/2011  5:57 PM

## 2011-03-30 NOTE — Op Note (Signed)
[facility] [address] [place], [place]  [address]  COLONOSCOPY PROCEDURE REPORT  PATIENT:  Carla, [name]  MR#:  [id number] BIRTHDATE:  12/17/1922, 88 yrs. old  GENDER:  female ENDOSCOPIST:  Dorena Cookey REF. BY: PROCEDURE DATE:  03/30/2011 PROCEDURE: ASA CLASS: INDICATIONS: Anemia MEDICATIONS:  Fentanyl 12.5 mcg, Versed 1 mg DESCRIPTION OF PROCEDURE:   After the risks benefits and alternatives of the procedure were thoroughly explained, informed consent was obtained.  The [id number] ([id number]) endoscope was introduced through the anus and advanced to the , without limitations.  The quality of the prep was .  The instrument was then slowly withdrawn as the colon was fully examined. <<PROCEDUREIMAGES>>  FINDINGS: Circumferential irregular friable mass in the sigmoid colon approximately 5 cm in length, minimally obstructing Sigmoid and descending diverticulosis. Unable to advance beyond the mid transverse colon due to looping and tortuous colon.  COMPLICATIONS:  None IMPRESSION: Circumferential sigmoid colon mass consistent with carcinoma, unable to visualize see a sitting colon and cecum do to looping and tortuous colon and possibly limited mobility from sigmoid mass  RECOMMENDATIONS: Consult surgery for consideration of operability of this lesion.  ______________________________ Dorena Cookey  CC:  n. eSIGNED:   Dorena Cookey at 03/30/2011 09:22 AM  [place], [phone]

## 2011-03-30 NOTE — Progress Notes (Signed)
[facility] Progress Note  Subjective: Tolerated bowel prep, no complaints  Objective: Vital signs in last 24 hours: Temp:  [97.7 F (36.5 C)-98.3 F (36.8 C)] 97.7 F (36.5 C) (01/14 0811) Pulse Rate:  [62-98] 95  (01/14 0500) Resp:  [8-40] 15  (01/14 0920) BP: (104-165)/(68-112) 154/93 mmHg (01/14 0920) SpO2:  [93 %-100 %] 98 % (01/14 0920) Weight:  [82 kg (180 lb 12.4 oz)] 82 kg (180 lb 12.4 oz) (01/14 0500) Weight change: -0.5 kg (-1 lb 1.6 oz)   PE: Abdomen soft  Lab Results: Results for orders placed during the hospital encounter of 03/27/11 (from the past 24 hour(s))  PROTIME-INR     Status: Abnormal   Collection Time   03/30/11  5:00 AM      Component Value Range   Prothrombin Time 16.8 (*) 11.6 - 15.2 (seconds)   INR 1.34  0.00 - 1.49   BASIC METABOLIC PANEL     Status: Abnormal   Collection Time   03/30/11  5:00 AM      Component Value Range   Sodium 142  135 - 145 (mEq/L)   Potassium 3.7  3.5 - 5.1 (mEq/L)   Chloride 108  96 - 112 (mEq/L)   CO2 26  19 - 32 (mEq/L)   Glucose, Bld 104 (*) 70 - 99 (mg/dL)   BUN 9  6 - 23 (mg/dL)   Creatinine, Ser 4.40  0.50 - 1.10 (mg/dL)   Calcium 8.6  8.4 - 34.7 (mg/dL)   GFR calc non Af Amer 74 (*) >90 (mL/min)   GFR calc Af Amer 85 (*) >90 (mL/min)  CBC     Status: Abnormal   Collection Time   03/30/11  5:00 AM      Component Value Range   WBC 3.4 (*) 4.0 - 10.5 (K/uL)   RBC 4.32  3.87 - 5.11 (MIL/uL)   Hemoglobin 10.8 (*) 12.0 - 15.0 (g/dL)   HCT 42.5 (*) 95.6 - 46.0 (%)   MCV 81.9  78.0 - 100.0 (fL)   MCH 25.0 (*) 26.0 - 34.0 (pg)   MCHC 30.5  30.0 - 36.0 (g/dL)   RDW 38.7 (*) 56.4 - 15.5 (%)   Platelets 232  150 - 400 (K/uL)  HEPARIN LEVEL (UNFRACTIONATED)     Status: Abnormal   Collection Time   03/30/11  5:00 AM      Component Value Range   Heparin Unfractionated <0.10 (*) 0.30 - 0.70 (IU/mL)    Studies/Results: Colonoscopy reveals a circumferential mass in the sigmoid colon which was traversable  but seemed to impaired her maneuverability of the scope proximally I was unable to advance beyond the midtransverse colon. EGD showed a small hiatal hernia and was otherwise unremarkable   Assessment: 1. Circumferential sigmoid colon mass consistent with carcinoma 2. Incomplete colonoscopy with cecum in a sitting colon not reached  Plan: Will obtain surgical consult and consider patient's candidacy for operative treatment of apparent carcinoma while awaiting biopsy results    Christhoper Busbee C 03/30/2011, 9:25 AM

## 2011-03-30 NOTE — Op Note (Signed)
[facility] [address] [place], [place]  [address]  ENDOSCOPY PROCEDURE REPORT  PATIENT:  Carla, [name]  MR#:  [id number] BIRTHDATE:  [birth year], 88 yrs. old  GENDER:  female  ENDOSCOPIST:  Dorena Cookey Referred by:  PROCEDURE DATE:  03/30/2011 PROCEDURE: ASA CLASS: INDICATIONS:  MEDICATIONS: Fentanyl 37.5 mcg, Versed 3 mg  TOPICAL ANESTHETIC: Cetacaine DESCRIPTION OF PROCEDURE:   After the risks benefits and alternatives of the procedure were thoroughly explained, informed consent was obtained.  The [id number] ([id number]) and EC-[id number] ([id number]) endoscope was introduced through the mouth and advanced to the , without limitations.  The instrument was slowly withdrawn as the mucosa was fully examined. <<PROCEDUREIMAGES>>  FINDINGS: Small hiatal hernia with widely patent lower esophageal ring otherwise normal study  COMPLICATIONS: None ENDOSCOPIC IMPRESSION: Small to moderate hiatal hernia with widely patent lower esophageal ring  RECOMMENDATIONS: Proceed with colonoscopy  REPEAT EXAM:  ______________________________ Dorena Cookey  CC:  n. eSIGNED:   Dorena Cookey at 03/30/2011 08:57 AM  [place], [phone]

## 2011-03-31 ENCOUNTER — Encounter (HOSPITAL_COMMUNITY): Payer: Self-pay | Admitting: Gastroenterology

## 2011-03-31 ENCOUNTER — Inpatient Hospital Stay (HOSPITAL_COMMUNITY): Payer: Medicare Other

## 2011-03-31 LAB — CBC
HCT: 35.5 % — ABNORMAL LOW (ref 36.0–46.0)
MCH: 25.2 pg — ABNORMAL LOW (ref 26.0–34.0)
MCV: 82.8 fL (ref 78.0–100.0)
Platelets: ADEQUATE 10*3/uL (ref 150–400)
RDW: 25.4 % — ABNORMAL HIGH (ref 11.5–15.5)
WBC: 4.1 10*3/uL (ref 4.0–10.5)

## 2011-03-31 LAB — CEA: CEA: 3.5 ng/mL (ref 0.0–5.0)

## 2011-03-31 MED ORDER — IOHEXOL 300 MG/ML  SOLN
20.0000 mL | INTRAMUSCULAR | Status: AC
  Administered 2011-03-31 (×2): 20 mL via ORAL

## 2011-03-31 MED ORDER — IOHEXOL 300 MG/ML  SOLN
100.0000 mL | Freq: Once | INTRAMUSCULAR | Status: AC | PRN
  Administered 2011-03-31: 100 mL via INTRAVENOUS

## 2011-03-31 MED ORDER — HEPARIN SOD (PORCINE) IN D5W 100 UNIT/ML IV SOLN
1500.0000 [IU]/h | INTRAVENOUS | Status: DC
  Administered 2011-03-31: 1400 [IU]/h via INTRAVENOUS
  Filled 2011-03-31 (×2): qty 250

## 2011-03-31 NOTE — Progress Notes (Signed)
ANTICOAGULATION CONSULT NOTE - Initial Consult  Pharmacy Consult for Heparin Indication: atrial fibrillation  Allergies  Allergen Reactions  . Lasix (Furosemide) Swelling  . Penicillins Swelling    Mouth swelling and redness  . Sulfa Antibiotics Rash    Patient Measurements: Height: 5\' 7"  (170.2 cm) Weight: 180 lb 12.4 oz (82 kg) IBW/kg (Calculated) : 61.6  Adjusted Body Weight:    Vital Signs: Temp: 98.5 F (36.9 C) (01/15 1330) Temp src: Oral (01/15 0500) BP: 141/80 mmHg (01/15 1330) Pulse Rate: 62  (01/15 1330)  Labs:  Basename 03/31/11 0615 03/30/11 0500 03/29/11 0530  HGB 10.8* 10.8* --  HCT 35.5* 35.4* 35.0*  PLT PLATELET CLUMPS NOTED ON SMEAR, COUNT APPEARS ADEQUATE 232 230  APTT -- -- --  LABPROT 19.1* 16.8* 16.0*  INR 1.57* 1.34 1.25  HEPARINUNFRC -- <0.10* 0.30  CREATININE -- 0.78 0.88  CKTOTAL -- -- --  CKMB -- -- --  TROPONINI -- -- --   Estimated Creatinine Clearance: 55.6 ml/min (by C-G formula based on Cr of 0.78).  Medical History: Past Medical History  Diagnosis Date  . Irregular heart rhythm   . Diastolic heart failure   . Shortness of breath   . Ovarian cancer   . Ovarian cancer   . Hypertension   . Atrial fibrillation   . Tachy-brady syndrome   . CHF (congestive heart failure)     diastolic heart failure    Medications:  Scheduled:    . calcium-vitamin D  1 tablet Oral BID  . diltiazem  180 mg Oral Daily  . iohexol  20 mL Oral Q1 Hr x 2  . lubiprostone  24 mcg Oral BID WC  . polysaccharide iron  150 mg Oral Q0700  . ramipril  10 mg Oral Daily  . sodium chloride  3 mL Intravenous Q12H  . sotalol  160 mg Oral BID   Infusions:    . heparin      Assessment: Patient currently in afib previously on heparin which was stopped yesterday for colonoscopy. Anemia noted. New dx of adenocarcinoma noted. GI ok'd restarting heparin for now. INR still elevated at 1.57.  Goal of Therapy:  Heparin level 0.3-0.7 units/ml   Plan:    infusion rate 1400 units/hr as previously therapeutic at this level. No bolus per MD request. Check heparin level in 6-8 hrs and daily.  Misty Stanley Stillinger 03/31/2011,4:19 PM

## 2011-03-31 NOTE — Progress Notes (Signed)
1 Day Post-Op  Subjective: Path shows adenocarcinoma from colon bx.  She has a hx of ovarian ca at age 78 with chemo.  Off anticoagulation, in AFib with pacing.  No awareness of rhythm issues till admission, anemia noted last year.  Objective: Vital signs in last 24 hours: Temp:  [97.3 F (36.3 C)-98 F (36.7 C)] 97.7 F (36.5 C) (01/15 0500) Pulse Rate:  [69-90] 69  (01/15 0500) Resp:  [12-27] 16  (01/15 0500) BP: (112-151)/(66-115) 116/71 mmHg (01/15 0500) SpO2:  [94 %-100 %] 96 % (01/15 0500) Last BM Date: 03/30/11  Intake/Output from previous day: 01/14 0701 - 01/15 0700 In: 100 [I.V.:100] Out: -  Intake/Output this shift: Total I/O In: 480 [P.O.:480] Out: -   PE:  Alert up in bed talking with son.  Chest : clear  Abd:  Nontender, +BS. No distension.  Lab Results:   Basename 03/31/11 0615 03/30/11 0500  WBC 4.1 3.4*  HGB 10.8* 10.8*  HCT 35.5* 35.4*  PLT PLATELET CLUMPS NOTED ON SMEAR, COUNT APPEARS ADEQUATE 232    BMET  Basename 03/30/11 0500 03/29/11 0530  NA 142 141  K 3.7 3.9  CL 108 105  CO2 26 29  GLUCOSE 104* 102*  BUN 9 17  CREATININE 0.78 0.88  CALCIUM 8.6 9.1   PT/INR  Basename 03/31/11 0615 03/30/11 0500  LABPROT 19.1* 16.8*  INR 1.57* 1.34     Studies/Results: No results found.  Anti-infectives: Anti-infectives    None     Current Facility-Administered Medications  Medication Dose Route Frequency Provider Last Rate Last Dose  . 0.9 %  sodium chloride infusion  250 mL Intravenous PRN Carla Reichert, MD      . acetaminophen (TYLENOL) tablet 650 mg  650 mg Oral Q4H PRN Carla Reichert, MD      . calcium-vitamin D (OSCAL WITH D) 500-200 MG-UNIT per tablet 1 tablet  1 tablet Oral BID Carla Reichert, MD   1 tablet at 03/30/11 2106  . diltiazem (CARDIZEM CD) 24 hr capsule 180 mg  180 mg Oral Daily Carla Reichert, MD   180 mg at 03/30/11 1206  . lubiprostone (AMITIZA) capsule 24 mcg  24 mcg Oral BID WC Carla Reichert, MD   24 mcg at  03/30/11 1603  . ondansetron (ZOFRAN) injection 4 mg  4 mg Intravenous Q6H PRN Carla Reichert, MD      . polysaccharide iron (NIFEREX) capsule 150 mg  150 mg Oral Q0700 Carla Reichert, MD   150 mg at 03/31/11 0625  . ramipril (ALTACE) capsule 10 mg  10 mg Oral Daily Carla Reichert, MD   10 mg at 03/30/11 1206  . sodium chloride 0.9 % injection 3 mL  3 mL Intravenous Q12H Carla Reichert, MD   3 mL at 03/29/11 0952  . sodium chloride 0.9 % injection 3 mL  3 mL Intravenous PRN Carla Reichert, MD      . sotalol (BETAPACE) tablet 160 mg  160 mg Oral BID Carla Reichert, MD   160 mg at 03/30/11 2106  . DISCONTD: 0.9 %  sodium chloride infusion   Intravenous Once Carla Jaksch, MD      . DISCONTD: heparin ADULT infusion 100 units/mL (25000 units/250 mL)  1,400 Units/hr Intravenous Continuous [name], PHARMD   1,400 Units/hr at 03/29/11 1138    Assessment/Plan Sigmoid colon Mass, Path shows Adenocarcinoma.(CEA 3.5) Hx Ovarian Ca Anemia  Hx Afib, tachy/brady syndrome. (  Hx. DCCV and PTVP) Off anticoagulation INR 1.57 CHF Hx hypertension  Plan: CT chest and abd/pelvis today.  Discuss Heparin for anticoagulation while w/u proceeds.        LOS: 4 days    [name] 03/31/2011

## 2011-03-31 NOTE — Progress Notes (Signed)
SUBJECTIVE:  No chest pain or palpitations.  Tolerate a colonoscopy well.  She is awaiting news from the surgeon.  OBJECTIVE:   Vitals:   Filed Vitals:   03/30/11 1206 03/30/11 1400 03/30/11 2100 03/31/11 0500  BP: 148/96 112/74 151/94 116/71  Pulse:  77 90 69  Temp:  98 F (36.7 C) 97.3 F (36.3 C) 97.7 F (36.5 C)  TempSrc:  Oral Oral Oral  Resp:  18 20 16   Height:      Weight:      SpO2:  94%  96%   I&O's:    Intake/Output Summary (Last 24 hours) at 03/31/11 0905 Last data filed at 03/30/11 1010  Gross per 24 hour  Intake    100 ml  Output      0 ml  Net    100 ml   TELEMETRY: Reviewed telemetry pt in atrial fibrillation with controlled ventricular response, currently paced rhythm.:     PHYSICAL EXAM General: Well developed, well nourished, in no acute distress Head:    Normal cephalic and atramatic  Lungs:   Clear bilaterally to auscultation. Heart:  Regular rate and rhythm, S1-S2 Abdomen: Bowel sounds are positive, abdomen soft and non-tender Msk:   Normal strength and tone for age.  Mild kyphosis   Extremities:   No edema.   Neuro: Alert and oriented X 3. Psych:  Good affect, responds appropriately   LABS: Basic Metabolic Panel:  Basename 03/30/11 0500 03/29/11 0530  NA 142 141  K 3.7 3.9  CL 108 105  CO2 26 29  GLUCOSE 104* 102*  BUN 9 17  CREATININE 0.78 0.88  CALCIUM 8.6 9.1  MG -- --  PHOS -- --   Liver Function Tests: No results found for this basename: AST:2,ALT:2,ALKPHOS:2,BILITOT:2,PROT:2,ALBUMIN:2 in the last 72 hours No results found for this basename: LIPASE:2,AMYLASE:2 in the last 72 hours CBC:  Basename 03/31/11 0615 03/30/11 0500  WBC 4.1 3.4*  NEUTROABS -- --  HGB 10.8* 10.8*  HCT 35.5* 35.4*  MCV 82.8 81.9  PLT PLATELET CLUMPS NOTED ON SMEAR, COUNT APPEARS ADEQUATE 232   Cardiac Enzymes: No results found for this basename: CKTOTAL:3,CKMB:3,CKMBINDEX:3,TROPONINI:3 in the last 72 hours BNP: No components found with this  basename: POCBNP:3 D-Dimer: No results found for this basename: DDIMER:2 in the last 72 hours Hemoglobin A1C: No results found for this basename: HGBA1C in the last 72 hours Fasting Lipid Panel: No results found for this basename: CHOL,HDL,LDLCALC,TRIG,CHOLHDL,LDLDIRECT in the last 72 hours Thyroid Function Tests: No results found for this basename: TSH,T4TOTAL,FREET3,T3FREE,THYROIDAB in the last 72 hours Anemia Panel:  Basename 03/28/11 1018  VITAMINB12 --  FOLATE --  FERRITIN 103  TIBC 298  IRON 34*  RETICCTPCT --   Coag Panel:   Lab Results  Component Value Date   INR 1.57* 03/31/2011   INR 1.34 03/30/2011   INR 1.25 03/29/2011    RADIOLOGY: Dg Chest Port 1 View  03/27/2011  *RADIOLOGY REPORT*  Clinical Data: Short of breath  PORTABLE CHEST - 1 VIEW  Comparison: 05/29/2004.  Findings: Cardiomegaly. Monitoring leads are projected over the chest.  Dual lead left subclavian cardiac pacemaker.  Interstitial and mild alveolar pulmonary edema.  Basilar atelectasis.  Small bilateral pleural effusions.  IMPRESSION: Mild to moderate CHF.  Original Report Authenticated By: Andreas Newport, M.D.      ASSESSMENT: Atrial fibrillation, possible colon cancer   PLAN:  Continue sotalol.  She is off of Coumadin due to the anticipation of surgery.  She is currently  not receiving heparin either.  Echocardiogram showed normal left ventricular function.  No further cardiac testing is needed prior to surgery for colon cancer.  Heart failure symptoms were likely from diastolic dysfunction related to A. fib with rapid ventricular response.  She has improved significantly.    Now in a paced rhythm.  She does have underlying atrial fibrillation.  If she is not having any invasive procedures today, I would like to restart unfractionated heparin to reduce risk of stroke.  We'll have to get approval from GI given her biopsy yesterday.  INR slightly increased today from yesterday.  Continue to hold  Coumadin.  Corky Crafts., MD  03/31/2011  9:05 AM

## 2011-04-01 LAB — PROTIME-INR
INR: 1.62 — ABNORMAL HIGH (ref 0.00–1.49)
Prothrombin Time: 19.5 seconds — ABNORMAL HIGH (ref 11.6–15.2)

## 2011-04-01 LAB — COMPREHENSIVE METABOLIC PANEL
Albumin: 3.1 g/dL — ABNORMAL LOW (ref 3.5–5.2)
BUN: 7 mg/dL (ref 6–23)
Calcium: 9.1 mg/dL (ref 8.4–10.5)
Creatinine, Ser: 0.85 mg/dL (ref 0.50–1.10)
Total Bilirubin: 0.2 mg/dL — ABNORMAL LOW (ref 0.3–1.2)
Total Protein: 6.6 g/dL (ref 6.0–8.3)

## 2011-04-01 LAB — TYPE AND SCREEN: ABO/RH(D): O POS

## 2011-04-01 LAB — CBC
HCT: 35.2 % — ABNORMAL LOW (ref 36.0–46.0)
MCHC: 29.8 g/dL — ABNORMAL LOW (ref 30.0–36.0)
MCV: 82.4 fL (ref 78.0–100.0)
Platelets: 209 10*3/uL (ref 150–400)
RDW: 24.8 % — ABNORMAL HIGH (ref 11.5–15.5)

## 2011-04-01 LAB — HEPARIN LEVEL (UNFRACTIONATED): Heparin Unfractionated: 0.58 IU/mL (ref 0.30–0.70)

## 2011-04-01 LAB — MAGNESIUM: Magnesium: 2 mg/dL (ref 1.5–2.5)

## 2011-04-01 MED ORDER — ALVIMOPAN 12 MG PO CAPS
12.0000 mg | ORAL_CAPSULE | Freq: Once | ORAL | Status: DC
  Filled 2011-04-01: qty 1

## 2011-04-01 MED ORDER — POLYETHYLENE GLYCOL 3350 17 G PO PACK
17.0000 g | PACK | Freq: Once | ORAL | Status: AC
  Administered 2011-04-01: 17 g via ORAL
  Filled 2011-04-01: qty 1

## 2011-04-01 MED ORDER — HEPARIN SOD (PORCINE) IN D5W 100 UNIT/ML IV SOLN
1500.0000 [IU]/h | INTRAVENOUS | Status: DC
  Administered 2011-04-01 – 2011-04-02 (×2): 1500 [IU]/h via INTRAVENOUS
  Filled 2011-04-01: qty 250

## 2011-04-01 MED ORDER — SODIUM CHLORIDE 0.9 % IV SOLN
1.0000 g | INTRAVENOUS | Status: DC
  Filled 2011-04-01: qty 1

## 2011-04-01 NOTE — Consult Note (Signed)
Sigmoid colon mass, likely malignant; await path report If path +cancer - will order CT chest/abd/pelvis Check CEA Clear liquid diet + boost/ensure for now Follow INR  I saw the patient, participated in the history, exam and medical decision making, and concur with the physician assistant's note above.  Astria Sella. [name], MD, FACS General, Bariatric, & Minimally Invasive Surgery [facility], [place]

## 2011-04-01 NOTE — Progress Notes (Signed)
ANTICOAGULATION CONSULT NOTE - Initial Consult  Pharmacy Consult for Heparin Indication: atrial fibrillation  Allergies  Allergen Reactions  . Lasix (Furosemide) Swelling  . Penicillins Swelling    Mouth swelling and redness  . Sulfa Antibiotics Rash    Patient Measurements: Height: 5\' 7"  (170.2 cm) Weight: 180 lb 12.4 oz (82 kg) IBW/kg (Calculated) : 61.6  Adjusted Body Weight:    Vital Signs: Temp: 97.4 F (36.3 C) (01/15 2100) Temp src: Oral (01/15 2100) BP: 144/75 mmHg (01/15 2100) Pulse Rate: 62  (01/15 2100)  Labs:  Basename 03/31/11 2327 03/31/11 0615 03/30/11 0500 03/29/11 0530  HGB -- 10.8* 10.8* --  HCT -- 35.5* 35.4* 35.0*  PLT -- PLATELET CLUMPS NOTED ON SMEAR, COUNT APPEARS ADEQUATE 232 230  APTT -- -- -- --  LABPROT -- 19.1* 16.8* 16.0*  INR -- 1.57* 1.34 1.25  HEPARINUNFRC 0.21* -- <0.10* 0.30  CREATININE -- -- 0.78 0.88  CKTOTAL -- -- -- --  CKMB -- -- -- --  TROPONINI -- -- -- --   Estimated Creatinine Clearance: 55.6 ml/min (by C-G formula based on Cr of 0.78).  Assessment: 78 yo female with h/o Afib s/p colonoscopy for Heparin  Goal of Therapy:  Heparin level 0.3-0.7 units/ml   Plan:  Increase Heparin 1500 units/hr Follow-up am labs.   Twyla Dais, Gary Fleet 04/01/2011,12:16 AM

## 2011-04-01 NOTE — Progress Notes (Signed)
SUBJECTIVE:  No chest pain or palpitations.   She is awaiting news from the CT scan and plan for colon CA management.  OBJECTIVE:   Vitals:   Filed Vitals:   03/31/11 1100 03/31/11 1330 03/31/11 2100 04/01/11 0500  BP: 120/70 141/80 144/75 119/66  Pulse:  62 62 67  Temp:  98.5 F (36.9 C) 97.4 F (36.3 C) 97.8 F (36.6 C)  TempSrc:   Oral Oral  Resp:  16 20 20   Height:      Weight:      SpO2:  96% 97% 94%   I&O's:    Intake/Output Summary (Last 24 hours) at 04/01/11 1610 Last data filed at 03/31/11 1300  Gross per 24 hour  Intake    480 ml  Output      0 ml  Net    480 ml   TELEMETRY: Reviewed telemetry pt in atrial fibrillation with controlled ventricular response, currently paced rhythm.:     PHYSICAL EXAM General: Well developed, well nourished, in no acute distress Head:    Normal cephalic and atramatic  Lungs:   Clear bilaterally to auscultation. Heart:  Regular rate and rhythm, S1-S2 Abdomen: Bowel sounds are positive, abdomen soft and non-tender Msk:   Normal strength and tone for age.  Mild kyphosis   Extremities:   No edema.   Neuro: Alert and oriented X 3. Psych:  Good affect, responds appropriately   LABS: Basic Metabolic Panel:  Basename 03/30/11 0500  NA 142  K 3.7  CL 108  CO2 26  GLUCOSE 104*  BUN 9  CREATININE 0.78  CALCIUM 8.6  MG --  PHOS --   Liver Function Tests: No results found for this basename: AST:2,ALT:2,ALKPHOS:2,BILITOT:2,PROT:2,ALBUMIN:2 in the last 72 hours No results found for this basename: LIPASE:2,AMYLASE:2 in the last 72 hours CBC:  Basename 03/31/11 0615 03/30/11 0500  WBC 4.1 3.4*  NEUTROABS -- --  HGB 10.8* 10.8*  HCT 35.5* 35.4*  MCV 82.8 81.9  PLT PLATELET CLUMPS NOTED ON SMEAR, COUNT APPEARS ADEQUATE 232   Cardiac Enzymes: No results found for this basename: CKTOTAL:3,CKMB:3,CKMBINDEX:3,TROPONINI:3 in the last 72 hours BNP: No components found with this basename: POCBNP:3 D-Dimer: No results found  for this basename: DDIMER:2 in the last 72 hours Hemoglobin A1C: No results found for this basename: HGBA1C in the last 72 hours Fasting Lipid Panel: No results found for this basename: CHOL,HDL,LDLCALC,TRIG,CHOLHDL,LDLDIRECT in the last 72 hours Thyroid Function Tests: No results found for this basename: TSH,T4TOTAL,FREET3,T3FREE,THYROIDAB in the last 72 hours Anemia Panel: No results found for this basename: VITAMINB12,FOLATE,FERRITIN,TIBC,IRON,RETICCTPCT in the last 72 hours Coag Panel:   Lab Results  Component Value Date   INR 1.57* 03/31/2011   INR 1.34 03/30/2011   INR 1.25 03/29/2011    RADIOLOGY: Dg Chest Port 1 View  03/27/2011  *RADIOLOGY REPORT*  Clinical Data: Short of breath  PORTABLE CHEST - 1 VIEW  Comparison: 05/29/2004.  Findings: Cardiomegaly. Monitoring leads are projected over the chest.  Dual lead left subclavian cardiac pacemaker.  Interstitial and mild alveolar pulmonary edema.  Basilar atelectasis.  Small bilateral pleural effusions.  IMPRESSION: Mild to moderate CHF.  Original Report Authenticated By: Andreas Newport, M.D.      ASSESSMENT: Atrial fibrillation,  colon cancer   PLAN:  Continue sotalol.  She is off of Coumadin due to the anticipation of surgery.  Heparin restarted.  Echocardiogram showed normal left ventricular function.  No further cardiac testing is needed prior to surgery for colon cancer.  Heart  failure symptoms were likely from diastolic dysfunction related to A. fib with rapid ventricular response.  She has improved significantly.    Now in a paced rhythm.  She does have underlying atrial fibrillation. Heparin for stroke prevention.  Continue to hold Coumadin until plan for surgery is made.  Corky Crafts., MD  04/01/2011  9:04 AM

## 2011-04-01 NOTE — Progress Notes (Signed)
bx for adenocarcinoma Awaiting CT chest/abd/pelvis  Lengthy discussion with pt, pt's son & daughter  If CTs are negative for metastatic disease - will proceed with sigmoid colectomy on Thursday If CT are positive for metastatic disease - will need med onc consult  Will review CT results with family in the am  I saw the patient, participated in the history, exam and medical decision making, and concur with the physician assistant's note above.  Amritha Sella. [name], MD, FACS General, Bariatric, & Minimally Invasive Surgery [facility], [place]

## 2011-04-01 NOTE — Progress Notes (Signed)
CT negative for metastatic disease. Lung lesions not impressive. CEA is low.  Imaging and case reviewed at GI tumor board this am - agree with plan to proceed with sigmoid colectomy Thursday  I discussed the procedure in detail with the pt and the pt's son. Explained that we Will try to do as much laparoscopically as possible but given prior h/o ovarian cancer and exp lap - there may be adhesions which would prompt [place] to convert to open sooner.   We discussed the risks and benefits of surgery including, but not limited to bleeding, infection (such as wound infection, abdominal abscess), injury to surrounding structures, blood clot formation, urinary retention, incisional hernia, anastomotic stricture, anastomotic leak, anesthesia risks, pulmonary & cardiac complications such as pneumonia &/or heart attack, need for additional procedures, ileus, & prolonged hospitalization.  We discussed the typical postoperative recovery course, including limitations & restrictions postoperatively. I explained that the likelihood of improvement in their symptoms is good.  Clears/ensure today, NPO p MN Type and cross INR slightly increased today. Will repeat in am. If INR cont to rise will need to give FFP in am Invanz on call to OR  Plan: Lap assist sigmoid colectomy Thursday.   [name]. [name], MD, FACS General, Bariatric, & Minimally Invasive Surgery [facility], [place]

## 2011-04-01 NOTE — Progress Notes (Signed)
ANTICOAGULATION CONSULT NOTE - Follow Up Consult  Pharmacy Consult for Heparin Indication: atrial fibrillation  Allergies  Allergen Reactions  . Lasix (Furosemide) Swelling  . Penicillins Swelling    Mouth swelling and redness  . Sulfa Antibiotics Rash    Patient Measurements: Height: 5\' 7"  (170.2 cm) Weight: 180 lb 12.4 oz (82 kg) IBW/kg (Calculated) : 61.6   Vital Signs: Temp: 97.8 F (36.6 C) (01/16 0500) Temp src: Oral (01/16 0500) BP: 130/60 mmHg (01/16 0926) Pulse Rate: 67  (01/16 0500)  Labs:  Basename 04/01/11 0754 03/31/11 2327 03/31/11 0615 03/30/11 0500  HGB 10.5* -- 10.8* --  HCT 35.2* -- 35.5* 35.4*  PLT 209 -- PLATELET CLUMPS NOTED ON SMEAR, COUNT APPEARS ADEQUATE 232  APTT -- -- -- --  LABPROT 19.5* -- 19.1* 16.8*  INR 1.62* -- 1.57* 1.34  HEPARINUNFRC 0.58 0.21* -- <0.10*  CREATININE 0.85 -- -- 0.78  CKTOTAL -- -- -- --  CKMB -- -- -- --  TROPONINI -- -- -- --   Estimated Creatinine Clearance: 52.4 ml/min (by C-G formula based on Cr of 0.85).   Medications:  Infusions:    . heparin 1,500 Units/hr (04/01/11 0047)    Assessment: 78 y/o female patient receiving heparin infusion for h/o afib. Heparin level therapeutic, no bleeding reported. Coumadin on hold for planned colon surgery.  Goal of Therapy:  Heparin level 0.3-0.7 units/ml   Plan:  Continue heparin gtt at 1500 units/hr and f/u in am.  Verlene Mayer, PharmD, BCPS Pager [phone] 04/01/2011,9:42 AM

## 2011-04-01 NOTE — Progress Notes (Signed)
2 Days Post-Op  Subjective: No new c/o. Feels well Appreciate cards recs.  Objective: Vital signs in last 24 hours: Temp:  [97.4 F (36.3 C)-98.5 F (36.9 C)] 97.8 F (36.6 C) (01/16 0500) Pulse Rate:  [62-67] 67  (01/16 0500) Resp:  [16-20] 20  (01/16 0500) BP: (119-144)/(60-80) 130/60 mmHg (01/16 0926) SpO2:  [94 %-97 %] 94 % (01/16 0500) Last BM Date: 03/30/11  Intake/Output this shift:    Physical Exam: BP 130/60  Pulse 67  Temp(Src) 97.8 F (36.6 C) (Oral)  Resp 20  Ht 5\' 7"  (1.702 m)  Wt 82 kg (180 lb 12.4 oz)  BMI 28.31 kg/m2  SpO2 94% Abdomen: soft, NT  Labs: CBC  Basename 04/01/11 0754 03/31/11 0615  WBC 5.4 4.1  HGB 10.5* 10.8*  HCT 35.2* 35.5*  PLT 209 PLATELET CLUMPS NOTED ON SMEAR, COUNT APPEARS ADEQUATE   BMET  Basename 04/01/11 0754 03/30/11 0500  NA 139 142  K 3.7 3.7  CL 105 108  CO2 27 26  GLUCOSE 103* 104*  BUN 7 9  CREATININE 0.85 0.78  CALCIUM 9.1 8.6   LFT  Basename 04/01/11 0754  PROT 6.6  ALBUMIN 3.1*  AST 31  ALT 19  ALKPHOS 50  BILITOT 0.2*  BILIDIR --  IBILI --  LIPASE --   PT/INR  Basename 04/01/11 0754 03/31/11 0615  LABPROT 19.5* 19.1*  INR 1.62* 1.57*   ABG No results found for this basename: PHART:2,PCO2:2,PO2:2,HCO3:2 in the last 72 hours  Studies/Results: Ct Chest W Contrast  03/31/2011  *RADIOLOGY REPORT*  Clinical Data:  Sigmoid colon adenocarcinoma.  High blood pressure. History of ovarian cancer.  CT CHEST, ABDOMEN AND PELVIS WITH CONTRAST  Technique:  Multidetector CT imaging of the chest, abdomen and pelvis was performed following the standard protocol during bolus administration of intravenous contrast.  Contrast: OMNIPAQUE IOHEXOL 300 MG/ML IV SOLN  Comparison:  No comparison CT available for direct review.  Report from prior abdominal and pelvic CT 12/30/2002 is available.  CT CHEST  Findings:  Probable lymph node within the right major fissure (series 3 image 26).  2.4 mm pleural based  nodule superior segment right lower lobe (series 3 image 24).  Mild hazy parenchymal changes left upper lobe spanning over 1.5 cm (series 3 image 23).  Inferior left lateral lower lobe 1.5 x 1 x 1.6 cm nonspecific consolidation/lesion (series 3 image 51).  These latter two findings may represent minimal pneumonitis type changes and atelectasis respectively.  To exclude the presence of a mass, follow-up CT of the chest in 3 months recommended.  Pacemaker enters from the left with leads in the region of the right atrium and right ventricle.  Cardiomegaly.  Prominent coronary artery calcifications.  Atherosclerotic type changes of the thoracic aorta and great vessels. Mild ectasia ascending thoracic aorta measuring up to 3.5 cm.  No obvious pulmonary embolus (pulmonary embolus protocol not utilized)  No mediastinal, hilar or axillary adenopathy.  Small hiatal hernia.  No bony destructive lesion.  IMPRESSION: Mild hazy parenchymal changes left upper lobe spanning and inferior left lateral lower lobe nonspecific consolidation/lesion may represent minimal pneumonitis type changes and atelectasis respectively.  To exclude the presence of a mass, follow-up CT of the chest in 3 months recommended. Alternatively, attention to these regions on PET CT if this is to be obtained for evaluation of the sigmoid mass.  Atherosclerotic type changes coronary arteries, thoracic aorta and great vessels.  CT ABDOMEN AND PELVIS  Findings:  Circumferential  thickening of the distal descending colon sigmoid colon junction spanning over 5.5 cm probably represents the patient's primary malignancy.  Mild thickening of the the rectosigmoid colon which may be related to the under distension rather than tumor.  No surrounding pelvic adenopathy.  There is however, mild infiltration of presacral fat planes.  This appearance may be related to the patient's history of ovarian cancer and treatment of such.  Spread of colonic malignancy felt to be less  likely consideration.  Within the right lobe liver there is a 5 mm and 4 mm low density lesion too small to adequately characterize.  Mild enlargement of the adrenal glands bilaterally suggestive of adrenal hyperplasia without nodularity.  Right renal 2 cm cyst with parapelvic cysts noted bilaterally.  No focal splenic or pancreatic lesion.  Atherosclerotic type changes of the abdominal aorta and branch vessels.  Mild ectasia of the abdominal aorta without aneurysmal dilation.  Mild narrowing celiac artery, superior mesenteric artery and upper left renal artery.  Noncontrast filled views of the urinary bladder without gross abnormality.  Degenerative changes thoracic spine with various degrees of the spinal stenosis and foraminal narrowing most notable lower lumbar region.  No bony destructive lesion suggestive of osseous metastatic disease.  No calcified gallstones.  Surgical clips noted along the periaortic region and within the pelvis.  Retroperitoneal lymph nodes with short axis dimension less than 1 cm.  IMPRESSION: Circumferential thickening of the distal descending colon/sigmoid colon junction spanning over 5.5 cm probably represents the patient's primary malignancy.  Mild thickening of the the rectosigmoid colon which may be related to the under distension rather than tumor.  No surrounding pelvic adenopathy.  There is however, mild infiltration of presacral fat planes.  This appearance may be related to the patient's history of ovarian cancer and treatment of such.  Spread of colonic malignancy felt to be less likely consideration.  Within the right lobe liver there is a 5 mm and 4 mm low density lesion too small to adequately characterize.  Mild enlargement of the adrenal glands bilaterally suggestive of adrenal hyperplasia without nodularity.  Degenerative changes lumbar spine without bony destructive lesion.  Original Report Authenticated By: Fuller Canada, M.D.   Ct Abdomen Pelvis W  Contrast  03/31/2011  *RADIOLOGY REPORT*  Clinical Data:  Sigmoid colon adenocarcinoma.  High blood pressure. History of ovarian cancer.  CT CHEST, ABDOMEN AND PELVIS WITH CONTRAST  Technique:  Multidetector CT imaging of the chest, abdomen and pelvis was performed following the standard protocol during bolus administration of intravenous contrast.  Contrast: OMNIPAQUE IOHEXOL 300 MG/ML IV SOLN  Comparison:  No comparison CT available for direct review.  Report from prior abdominal and pelvic CT 12/30/2002 is available.  CT CHEST  Findings:  Probable lymph node within the right major fissure (series 3 image 26).  2.4 mm pleural based nodule superior segment right lower lobe (series 3 image 24).  Mild hazy parenchymal changes left upper lobe spanning over 1.5 cm (series 3 image 23).  Inferior left lateral lower lobe 1.5 x 1 x 1.6 cm nonspecific consolidation/lesion (series 3 image 51).  These latter two findings may represent minimal pneumonitis type changes and atelectasis respectively.  To exclude the presence of a mass, follow-up CT of the chest in 3 months recommended.  Pacemaker enters from the left with leads in the region of the right atrium and right ventricle.  Cardiomegaly.  Prominent coronary artery calcifications.  Atherosclerotic type changes of the thoracic aorta and great vessels.  Mild ectasia ascending thoracic aorta measuring up to 3.5 cm.  No obvious pulmonary embolus (pulmonary embolus protocol not utilized)  No mediastinal, hilar or axillary adenopathy.  Small hiatal hernia.  No bony destructive lesion.  IMPRESSION: Mild hazy parenchymal changes left upper lobe spanning and inferior left lateral lower lobe nonspecific consolidation/lesion may represent minimal pneumonitis type changes and atelectasis respectively.  To exclude the presence of a mass, follow-up CT of the chest in 3 months recommended. Alternatively, attention to these regions on PET CT if this is to be obtained for evaluation  of the sigmoid mass.  Atherosclerotic type changes coronary arteries, thoracic aorta and great vessels.  CT ABDOMEN AND PELVIS  Findings:  Circumferential thickening of the distal descending colon sigmoid colon junction spanning over 5.5 cm probably represents the patient's primary malignancy.  Mild thickening of the the rectosigmoid colon which may be related to the under distension rather than tumor.  No surrounding pelvic adenopathy.  There is however, mild infiltration of presacral fat planes.  This appearance may be related to the patient's history of ovarian cancer and treatment of such.  Spread of colonic malignancy felt to be less likely consideration.  Within the right lobe liver there is a 5 mm and 4 mm low density lesion too small to adequately characterize.  Mild enlargement of the adrenal glands bilaterally suggestive of adrenal hyperplasia without nodularity.  Right renal 2 cm cyst with parapelvic cysts noted bilaterally.  No focal splenic or pancreatic lesion.  Atherosclerotic type changes of the abdominal aorta and branch vessels.  Mild ectasia of the abdominal aorta without aneurysmal dilation.  Mild narrowing celiac artery, superior mesenteric artery and upper left renal artery.  Noncontrast filled views of the urinary bladder without gross abnormality.  Degenerative changes thoracic spine with various degrees of the spinal stenosis and foraminal narrowing most notable lower lumbar region.  No bony destructive lesion suggestive of osseous metastatic disease.  No calcified gallstones.  Surgical clips noted along the periaortic region and within the pelvis.  Retroperitoneal lymph nodes with short axis dimension less than 1 cm.  IMPRESSION: Circumferential thickening of the distal descending colon/sigmoid colon junction spanning over 5.5 cm probably represents the patient's primary malignancy.  Mild thickening of the the rectosigmoid colon which may be related to the under distension rather than  tumor.  No surrounding pelvic adenopathy.  There is however, mild infiltration of presacral fat planes.  This appearance may be related to the patient's history of ovarian cancer and treatment of such.  Spread of colonic malignancy felt to be less likely consideration.  Within the right lobe liver there is a 5 mm and 4 mm low density lesion too small to adequately characterize.  Mild enlargement of the adrenal glands bilaterally suggestive of adrenal hyperplasia without nodularity.  Degenerative changes lumbar spine without bony destructive lesion.  Original Report Authenticated By: Fuller Canada, M.D.    Assessment: Principal Problem:  *Atrial flutter with rapid ventricular response Active Problems:  Hypertension  Diastolic CHF, acute on chronic  Anemia  Colonic mass   Procedure(s): COLONOSCOPY ESOPHAGOGASTRODUODENOSCOPY (EGD)  Plan: Planning for OR tomorrow. INR has gone up a bit despite Coumadin being held, recheck in am. Will back diet down to clears. T+C for PRBC and FFP Hold heparin gtt in am. Pre-op Entereg and Invanz Dr. [name] to discuss procedure with patient and family.  LOS: 5 days    Marianna Fuss 04/01/2011

## 2011-04-02 LAB — CBC
HCT: 36.9 % (ref 36.0–46.0)
MCH: 24.7 pg — ABNORMAL LOW (ref 26.0–34.0)
MCV: 82 fL (ref 78.0–100.0)
RDW: 24.6 % — ABNORMAL HIGH (ref 11.5–15.5)
WBC: 9.3 10*3/uL (ref 4.0–10.5)

## 2011-04-02 LAB — HEPARIN LEVEL (UNFRACTIONATED): Heparin Unfractionated: 0.26 IU/mL — ABNORMAL LOW (ref 0.30–0.70)

## 2011-04-02 LAB — PROTIME-INR
INR: 1.57 — ABNORMAL HIGH (ref 0.00–1.49)
INR: 1.63 — ABNORMAL HIGH (ref 0.00–1.49)
Prothrombin Time: 19.6 seconds — ABNORMAL HIGH (ref 11.6–15.2)

## 2011-04-02 MED ORDER — ALVIMOPAN 12 MG PO CAPS
12.0000 mg | ORAL_CAPSULE | Freq: Once | ORAL | Status: AC
  Administered 2011-04-03: 12 mg via ORAL
  Filled 2011-04-02: qty 1

## 2011-04-02 MED ORDER — HEPARIN SOD (PORCINE) IN D5W 100 UNIT/ML IV SOLN
1500.0000 [IU]/h | INTRAVENOUS | Status: DC
  Filled 2011-04-02 (×2): qty 250

## 2011-04-02 NOTE — Progress Notes (Signed)
04/02/11 1100 UR Completed. Tera Mater, RN, BSN

## 2011-04-02 NOTE — Progress Notes (Signed)
ANTICOAGULATION CONSULT NOTE - Follow Up Consult  Pharmacy Consult for Heparin Indication: atrial fibrillation  Allergies  Allergen Reactions  . Lasix (Furosemide) Swelling  . Penicillins Swelling    Mouth swelling and redness  . Sulfa Antibiotics Rash    Patient Measurements: Height: 5\' 7"  (170.2 cm) Weight: 180 lb 12.4 oz (82 kg) IBW/kg (Calculated) : 61.6   Vital Signs: Temp: 98.4 F (36.9 C) (01/17 1400) Temp src: Oral (01/17 1400) BP: 119/69 mmHg (01/17 1400) Pulse Rate: 62  (01/17 1400)  Labs:  Alvira Philips 04/02/11 1957 04/02/11 0804 04/02/11 0645 04/01/11 0754 03/31/11 0615  HGB -- -- 11.1* 10.5* --  HCT -- -- 36.9 35.2* 35.5*  PLT -- -- 224 209 PLATELET CLUMPS NOTED ON SMEAR, COUNT APPEARS ADEQUATE  APTT -- -- -- -- --  LABPROT -- 19.6* 19.1* 19.5* --  INR -- 1.63* 1.57* 1.62* --  HEPARINUNFRC 0.35 0.26* 0.63 -- --  CREATININE -- -- -- 0.85 --  CKTOTAL -- -- -- -- --  CKMB -- -- -- -- --  TROPONINI -- -- -- -- --   Estimated Creatinine Clearance: 52.4 ml/min (by C-G formula based on Cr of 0.85).   Medications:  Scheduled:     . alvimopan  12 mg Oral Once  . calcium-vitamin D  1 tablet Oral BID  . diltiazem  180 mg Oral Daily  . ertapenem  1 g Intravenous 60 min Pre-Op  . lubiprostone  24 mcg Oral BID WC  . polysaccharide iron  150 mg Oral Q0700  . ramipril  10 mg Oral Daily  . sodium chloride  3 mL Intravenous Q12H  . sotalol  160 mg Oral BID  . DISCONTD: alvimopan  12 mg Oral Once    Assessment: 78 y/o female patient on chronic coumadin for h/o afib, has been on hold in anticipation of EGD now sx required. Heparin started for bridge therapy, turned off this am d/t plans for sx, but sx changed until tomorrow.  Heparin level is therapeutic = 0.35 after being restarted this am.   Goal of Therapy:  Heparin level 0.3-0.7 units/ml   Plan:  1. Continue heparin gtt at 1500 units/hr  2. F/u AML, note heparin may be off for OR 3. Plan to d/c heparin in  am on-call to OR.  Juliette Alcide, PharmD, BCPS.   04/02/2011 9:29 PM

## 2011-04-02 NOTE — Progress Notes (Signed)
SUBJECTIVE:  No chest pain or palpitations.   Awaiting surgery today.  Heparin off. OBJECTIVE:   Vitals:   Filed Vitals:   04/01/11 0926 04/01/11 1400 04/01/11 2100 04/02/11 0500  BP: 130/60 134/75 148/66 122/69  Pulse:  61 61 63  Temp:  97.9 F (36.6 C) 99 F (37.2 C) 98.3 F (36.8 C)  TempSrc:  Oral Oral Oral  Resp:  20 16 16   Height:      Weight:      SpO2:  95% 94% 93%   I&O's:    Intake/Output Summary (Last 24 hours) at 04/02/11 0933 Last data filed at 04/02/11 0900  Gross per 24 hour  Intake    403 ml  Output    850 ml  Net   -447 ml   TELEMETRY: Reviewed telemetry pt in atrial fibrillation with controlled ventricular response, currently paced rhythm.:     PHYSICAL EXAM General: Well developed, well nourished, in no acute distress Head:    Normal cephalic and atramatic  Lungs:   Clear bilaterally to auscultation. Heart:  Regular rate and rhythm, S1-S2 Abdomen: Bowel sounds are positive, abdomen soft and non-tender Msk:   Normal strength and tone for age.  Mild kyphosis   Extremities:   No edema.   Neuro: Alert and oriented X 3. Psych:  Good affect, responds appropriately   LABS: Basic Metabolic Panel:  Basename 04/01/11 0754  NA 139  K 3.7  CL 105  CO2 27  GLUCOSE 103*  BUN 7  CREATININE 0.85  CALCIUM 9.1  MG 2.0  PHOS --   Liver Function Tests:  Basename 04/01/11 0754  AST 31  ALT 19  ALKPHOS 50  BILITOT 0.2*  PROT 6.6  ALBUMIN 3.1*   No results found for this basename: LIPASE:2,AMYLASE:2 in the last 72 hours CBC:  Basename 04/02/11 0645 04/01/11 0754  WBC 9.3 5.4  NEUTROABS -- --  HGB 11.1* 10.5*  HCT 36.9 35.2*  MCV 82.0 82.4  PLT 224 209   Cardiac Enzymes: No results found for this basename: CKTOTAL:3,CKMB:3,CKMBINDEX:3,TROPONINI:3 in the last 72 hours BNP: No components found with this basename: POCBNP:3 D-Dimer: No results found for this basename: DDIMER:2 in the last 72 hours Hemoglobin A1C: No results found for this  basename: HGBA1C in the last 72 hours Fasting Lipid Panel: No results found for this basename: CHOL,HDL,LDLCALC,TRIG,CHOLHDL,LDLDIRECT in the last 72 hours Thyroid Function Tests: No results found for this basename: TSH,T4TOTAL,FREET3,T3FREE,THYROIDAB in the last 72 hours Anemia Panel: No results found for this basename: VITAMINB12,FOLATE,FERRITIN,TIBC,IRON,RETICCTPCT in the last 72 hours Coag Panel:   Lab Results  Component Value Date   INR 1.63* 04/02/2011   INR 1.57* 04/02/2011   INR 1.62* 04/01/2011       ASSESSMENT: Atrial fibrillation,  colon cancer   PLAN:  Continue sotalol.  She is off of Coumadin due to the anticipation of surgery.  Heparin off.    Now in a paced rhythm.  She does have underlying atrial fibrillation. Heparin/coumadin for stroke prevention after surgery when safe from the surgeon's perspective.    Corky Crafts., MD  04/02/2011  9:33 AM

## 2011-04-02 NOTE — Progress Notes (Signed)
ANTICOAGULATION CONSULT NOTE - Follow Up Consult  Pharmacy Consult for Heparin Indication: atrial fibrillation  Allergies  Allergen Reactions  . Lasix (Furosemide) Swelling  . Penicillins Swelling    Mouth swelling and redness  . Sulfa Antibiotics Rash    Patient Measurements: Height: 5\' 7"  (170.2 cm) Weight: 180 lb 12.4 oz (82 kg) IBW/kg (Calculated) : 61.6   Vital Signs: Temp: 98.3 F (36.8 C) (01/17 0500) Temp src: Oral (01/17 0500) BP: 133/77 mmHg (01/17 1020) Pulse Rate: 67  (01/17 1020)  Labs:  Basename 04/02/11 0804 04/02/11 0645 04/01/11 0754 03/31/11 0615  HGB -- 11.1* 10.5* --  HCT -- 36.9 35.2* 35.5*  PLT -- 224 209 PLATELET CLUMPS NOTED ON SMEAR, COUNT APPEARS ADEQUATE  APTT -- -- -- --  LABPROT 19.6* 19.1* 19.5* --  INR 1.63* 1.57* 1.62* --  HEPARINUNFRC 0.26* 0.63 0.58 --  CREATININE -- -- 0.85 --  CKTOTAL -- -- -- --  CKMB -- -- -- --  TROPONINI -- -- -- --   Estimated Creatinine Clearance: 52.4 ml/min (by C-G formula based on Cr of 0.85).   Medications:  Scheduled:    . alvimopan  12 mg Oral Once  . calcium-vitamin D  1 tablet Oral BID  . diltiazem  180 mg Oral Daily  . ertapenem  1 g Intravenous 60 min Pre-Op  . lubiprostone  24 mcg Oral BID WC  . polyethylene glycol  17 g Oral Once  . polysaccharide iron  150 mg Oral Q0700  . ramipril  10 mg Oral Daily  . sodium chloride  3 mL Intravenous Q12H  . sotalol  160 mg Oral BID  . DISCONTD: alvimopan  12 mg Oral Once  . DISCONTD: alvimopan  12 mg Oral Once    Assessment: 78 y/o female patient on chronic coumadin for h/o afib, has been on hold in anticipation of EGD now sx required. Heparin started for bridge therapy, turned off this am d/t plans for sx, but sx changed until tomorrow. Heparin level today was therapeutic, will resume at previous rate and check heparin level this evening. H/h stable, no bleeding reported.  Goal of Therapy:  Heparin level 0.3-0.7 units/ml   Plan:  Resume  heparin gtt at 1500 units/hr and check heparin level at 2000, plan to d/c heparin in am on-call to OR.  Verlene Mayer, PharmD, BCPS Pager [phone] 04/02/2011,1:14 PM

## 2011-04-02 NOTE — Progress Notes (Signed)
Pt's colectomy moved to Friday. Explained reason with family and pt who are in agreement.  Stop heparin gtt at 0200 1/18 Invanz and entereg on call to OR Plan Lap assist sigmoid colectomy Friday Already discussed risk/benefits with pt and pt's family Check INR in am.  Verity Sella. [name], MD, FACS General, Bariatric, & Minimally Invasive Surgery [facility], [place]

## 2011-04-02 NOTE — Progress Notes (Signed)
Pt's heparin drip was restarted at 1145 at 15 ml/hr. Heparin needs to be d/c'd 04/03/2011 at 0200 for sx. Pt was unable to have her sx today. Family and pt advised and were given information about the sx being rescheduled. Sanda Linger

## 2011-04-02 NOTE — Progress Notes (Signed)
3 Days Post-Op  Subjective: No new c/o. Feels well Awaiting surgery.  We have an emergency that is going to postpone her surgery for right now.  We can't tell her for sure if it's for today, or just a few hours.  Patient and family informed.  More folkd coming from [place], so I will try to get info to them ASAP.  Objective: Vital signs in last 24 hours: Temp:  [97.9 F (36.6 C)-99 F (37.2 C)] 98.3 F (36.8 C) (01/17 0500) Pulse Rate:  [61-63] 63  (01/17 0500) Resp:  [16-20] 16  (01/17 0500) BP: (122-148)/(66-75) 122/69 mmHg (01/17 0500) SpO2:  [93 %-95 %] 93 % (01/17 0500) Last BM Date: 03/30/11  Intake/Output this shift: Total I/O In: -  Out: 150 [Urine:150]  Physical Exam: BP 122/69  Pulse 63  Temp(Src) 98.3 F (36.8 C) (Oral)  Resp 16  Ht 5\' 7"  (1.702 m)  Wt 82 kg (180 lb 12.4 oz)  BMI 28.31 kg/m2  SpO2 93% Abdomen: soft, NT  Labs: CBC  Basename 04/02/11 0645 04/01/11 0754  WBC 9.3 5.4  HGB 11.1* 10.5*  HCT 36.9 35.2*  PLT 224 209   BMET  Basename 04/01/11 0754  NA 139  K 3.7  CL 105  CO2 27  GLUCOSE 103*  BUN 7  CREATININE 0.85  CALCIUM 9.1   LFT  Basename 04/01/11 0754  PROT 6.6  ALBUMIN 3.1*  AST 31  ALT 19  ALKPHOS 50  BILITOT 0.2*  BILIDIR --  IBILI --  LIPASE --   PT/INR  Basename 04/02/11 0804 04/02/11 0645  LABPROT 19.6* 19.1*  INR 1.63* 1.57*   ABG No results found for this basename: PHART:2,PCO2:2,PO2:2,HCO3:2 in the last 72 hours  Studies/Results: Ct Chest W Contrast  03/31/2011  *RADIOLOGY REPORT*  Clinical Data:  Sigmoid colon adenocarcinoma.  High blood pressure. History of ovarian cancer.  CT CHEST, ABDOMEN AND PELVIS WITH CONTRAST  Technique:  Multidetector CT imaging of the chest, abdomen and pelvis was performed following the standard protocol during bolus administration of intravenous contrast.  Contrast: OMNIPAQUE IOHEXOL 300 MG/ML IV SOLN  Comparison:  No comparison CT available for direct review.  Report  from prior abdominal and pelvic CT 12/30/2002 is available.  CT CHEST  Findings:  Probable lymph node within the right major fissure (series 3 image 26).  2.4 mm pleural based nodule superior segment right lower lobe (series 3 image 24).  Mild hazy parenchymal changes left upper lobe spanning over 1.5 cm (series 3 image 23).  Inferior left lateral lower lobe 1.5 x 1 x 1.6 cm nonspecific consolidation/lesion (series 3 image 51).  These latter two findings may represent minimal pneumonitis type changes and atelectasis respectively.  To exclude the presence of a mass, follow-up CT of the chest in 3 months recommended.  Pacemaker enters from the left with leads in the region of the right atrium and right ventricle.  Cardiomegaly.  Prominent coronary artery calcifications.  Atherosclerotic type changes of the thoracic aorta and great vessels. Mild ectasia ascending thoracic aorta measuring up to 3.5 cm.  No obvious pulmonary embolus (pulmonary embolus protocol not utilized)  No mediastinal, hilar or axillary adenopathy.  Small hiatal hernia.  No bony destructive lesion.  IMPRESSION: Mild hazy parenchymal changes left upper lobe spanning and inferior left lateral lower lobe nonspecific consolidation/lesion may represent minimal pneumonitis type changes and atelectasis respectively.  To exclude the presence of a mass, follow-up CT of the chest in 3 months recommended.  Alternatively, attention to these regions on PET CT if this is to be obtained for evaluation of the sigmoid mass.  Atherosclerotic type changes coronary arteries, thoracic aorta and great vessels.  CT ABDOMEN AND PELVIS  Findings:  Circumferential thickening of the distal descending colon sigmoid colon junction spanning over 5.5 cm probably represents the patient's primary malignancy.  Mild thickening of the the rectosigmoid colon which may be related to the under distension rather than tumor.  No surrounding pelvic adenopathy.  There is however, mild  infiltration of presacral fat planes.  This appearance may be related to the patient's history of ovarian cancer and treatment of such.  Spread of colonic malignancy felt to be less likely consideration.  Within the right lobe liver there is a 5 mm and 4 mm low density lesion too small to adequately characterize.  Mild enlargement of the adrenal glands bilaterally suggestive of adrenal hyperplasia without nodularity.  Right renal 2 cm cyst with parapelvic cysts noted bilaterally.  No focal splenic or pancreatic lesion.  Atherosclerotic type changes of the abdominal aorta and branch vessels.  Mild ectasia of the abdominal aorta without aneurysmal dilation.  Mild narrowing celiac artery, superior mesenteric artery and upper left renal artery.  Noncontrast filled views of the urinary bladder without gross abnormality.  Degenerative changes thoracic spine with various degrees of the spinal stenosis and foraminal narrowing most notable lower lumbar region.  No bony destructive lesion suggestive of osseous metastatic disease.  No calcified gallstones.  Surgical clips noted along the periaortic region and within the pelvis.  Retroperitoneal lymph nodes with short axis dimension less than 1 cm.  IMPRESSION: Circumferential thickening of the distal descending colon/sigmoid colon junction spanning over 5.5 cm probably represents the patient's primary malignancy.  Mild thickening of the the rectosigmoid colon which may be related to the under distension rather than tumor.  No surrounding pelvic adenopathy.  There is however, mild infiltration of presacral fat planes.  This appearance may be related to the patient's history of ovarian cancer and treatment of such.  Spread of colonic malignancy felt to be less likely consideration.  Within the right lobe liver there is a 5 mm and 4 mm low density lesion too small to adequately characterize.  Mild enlargement of the adrenal glands bilaterally suggestive of adrenal hyperplasia  without nodularity.  Degenerative changes lumbar spine without bony destructive lesion.  Original Report Authenticated By: Fuller Canada, M.D.   Ct Abdomen Pelvis W Contrast  03/31/2011  *RADIOLOGY REPORT*  Clinical Data:  Sigmoid colon adenocarcinoma.  High blood pressure. History of ovarian cancer.  CT CHEST, ABDOMEN AND PELVIS WITH CONTRAST  Technique:  Multidetector CT imaging of the chest, abdomen and pelvis was performed following the standard protocol during bolus administration of intravenous contrast.  Contrast: OMNIPAQUE IOHEXOL 300 MG/ML IV SOLN  Comparison:  No comparison CT available for direct review.  Report from prior abdominal and pelvic CT 12/30/2002 is available.  CT CHEST  Findings:  Probable lymph node within the right major fissure (series 3 image 26).  2.4 mm pleural based nodule superior segment right lower lobe (series 3 image 24).  Mild hazy parenchymal changes left upper lobe spanning over 1.5 cm (series 3 image 23).  Inferior left lateral lower lobe 1.5 x 1 x 1.6 cm nonspecific consolidation/lesion (series 3 image 51).  These latter two findings may represent minimal pneumonitis type changes and atelectasis respectively.  To exclude the presence of a mass, follow-up CT of the chest  in 3 months recommended.  Pacemaker enters from the left with leads in the region of the right atrium and right ventricle.  Cardiomegaly.  Prominent coronary artery calcifications.  Atherosclerotic type changes of the thoracic aorta and great vessels. Mild ectasia ascending thoracic aorta measuring up to 3.5 cm.  No obvious pulmonary embolus (pulmonary embolus protocol not utilized)  No mediastinal, hilar or axillary adenopathy.  Small hiatal hernia.  No bony destructive lesion.  IMPRESSION: Mild hazy parenchymal changes left upper lobe spanning and inferior left lateral lower lobe nonspecific consolidation/lesion may represent minimal pneumonitis type changes and atelectasis respectively.  To  exclude the presence of a mass, follow-up CT of the chest in 3 months recommended. Alternatively, attention to these regions on PET CT if this is to be obtained for evaluation of the sigmoid mass.  Atherosclerotic type changes coronary arteries, thoracic aorta and great vessels.  CT ABDOMEN AND PELVIS  Findings:  Circumferential thickening of the distal descending colon sigmoid colon junction spanning over 5.5 cm probably represents the patient's primary malignancy.  Mild thickening of the the rectosigmoid colon which may be related to the under distension rather than tumor.  No surrounding pelvic adenopathy.  There is however, mild infiltration of presacral fat planes.  This appearance may be related to the patient's history of ovarian cancer and treatment of such.  Spread of colonic malignancy felt to be less likely consideration.  Within the right lobe liver there is a 5 mm and 4 mm low density lesion too small to adequately characterize.  Mild enlargement of the adrenal glands bilaterally suggestive of adrenal hyperplasia without nodularity.  Right renal 2 cm cyst with parapelvic cysts noted bilaterally.  No focal splenic or pancreatic lesion.  Atherosclerotic type changes of the abdominal aorta and branch vessels.  Mild ectasia of the abdominal aorta without aneurysmal dilation.  Mild narrowing celiac artery, superior mesenteric artery and upper left renal artery.  Noncontrast filled views of the urinary bladder without gross abnormality.  Degenerative changes thoracic spine with various degrees of the spinal stenosis and foraminal narrowing most notable lower lumbar region.  No bony destructive lesion suggestive of osseous metastatic disease.  No calcified gallstones.  Surgical clips noted along the periaortic region and within the pelvis.  Retroperitoneal lymph nodes with short axis dimension less than 1 cm.  IMPRESSION: Circumferential thickening of the distal descending colon/sigmoid colon junction spanning  over 5.5 cm probably represents the patient's primary malignancy.  Mild thickening of the the rectosigmoid colon which may be related to the under distension rather than tumor.  No surrounding pelvic adenopathy.  There is however, mild infiltration of presacral fat planes.  This appearance may be related to the patient's history of ovarian cancer and treatment of such.  Spread of colonic malignancy felt to be less likely consideration.  Within the right lobe liver there is a 5 mm and 4 mm low density lesion too small to adequately characterize.  Mild enlargement of the adrenal glands bilaterally suggestive of adrenal hyperplasia without nodularity.  Degenerative changes lumbar spine without bony destructive lesion.  Original Report Authenticated By: Fuller Canada, M.D.    Assessment: Principal Problem:  *Atrial flutter with rapid ventricular response Active Problems:  Hypertension  Diastolic CHF, acute on chronic  Anemia  Colonic mass   Procedure(s): COLONOSCOPY ESOPHAGOGASTRODUODENOSCOPY (EGD)  Plan: Planning for OR today currently, postponed for emergency. Pre-op Entereg and Invanz INR still 1.63  Heparin level 0.26 Dr. [name] to discuss procedure with patient and  family.  LOS: 6 days    [name] 04/02/2011

## 2011-04-03 ENCOUNTER — Other Ambulatory Visit (INDEPENDENT_AMBULATORY_CARE_PROVIDER_SITE_OTHER): Payer: Self-pay | Admitting: General Surgery

## 2011-04-03 ENCOUNTER — Encounter (HOSPITAL_COMMUNITY): Payer: Self-pay | Admitting: Certified Registered"

## 2011-04-03 ENCOUNTER — Encounter (HOSPITAL_COMMUNITY): Admission: EM | Disposition: A | Discharge status: Skilled Nursing Facility | Payer: Self-pay | Source: Home / Self Care

## 2011-04-03 ENCOUNTER — Inpatient Hospital Stay (HOSPITAL_COMMUNITY): Payer: Medicare Other | Admitting: Certified Registered"

## 2011-04-03 HISTORY — PX: COLON RESECTION: SHX5231

## 2011-04-03 LAB — BASIC METABOLIC PANEL
BUN: 9 mg/dL (ref 6–23)
Creatinine, Ser: 0.8 mg/dL (ref 0.50–1.10)
GFR calc Af Amer: 75 mL/min — ABNORMAL LOW (ref >90)
GFR calc non Af Amer: 65 mL/min — ABNORMAL LOW (ref >90)

## 2011-04-03 LAB — CBC
HCT: 34.5 % — ABNORMAL LOW (ref 36.0–46.0)
MCH: 24.8 pg — ABNORMAL LOW (ref 26.0–34.0)
MCH: 24.9 pg — ABNORMAL LOW (ref 26.0–34.0)
MCHC: 30.7 g/dL (ref 30.0–36.0)
MCV: 81.3 fL (ref 78.0–100.0)
MCV: 81.2 fL (ref 78.0–100.0)
Platelets: 201 10*3/uL (ref 150–400)
RBC: 4.28 MIL/uL (ref 3.87–5.11)
RDW: 24.6 % — ABNORMAL HIGH (ref 11.5–15.5)
RDW: 24.3 % — ABNORMAL HIGH (ref 11.5–15.5)
WBC: 9.7 10*3/uL (ref 4.0–10.5)

## 2011-04-03 LAB — PREPARE FRESH FROZEN PLASMA: Unit division: 0

## 2011-04-03 LAB — PROTIME-INR: Prothrombin Time: 18.6 seconds — ABNORMAL HIGH (ref 11.6–15.2)

## 2011-04-03 LAB — MAGNESIUM: Magnesium: 1.8 mg/dL (ref 1.5–2.5)

## 2011-04-03 LAB — HEPARIN LEVEL (UNFRACTIONATED): Heparin Unfractionated: <0.1 IU/mL — ABNORMAL LOW (ref 0.30–0.70)

## 2011-04-03 SURGERY — COLON RESECTION LAPAROSCOPIC
Anesthesia: General | Site: Abdomen | Wound class: Clean Contaminated

## 2011-04-03 MED ORDER — CIPROFLOXACIN IN D5W 400 MG/200ML IV SOLN
INTRAVENOUS | Status: AC
  Filled 2011-04-03: qty 200

## 2011-04-03 MED ORDER — 0.9 % SODIUM CHLORIDE (POUR BTL) OPTIME
TOPICAL | Status: DC | PRN
  Administered 2011-04-03: 1000 mL

## 2011-04-03 MED ORDER — METRONIDAZOLE IN NACL 5-0.79 MG/ML-% IV SOLN
500.0000 mg | INTRAVENOUS | Status: AC
  Administered 2011-04-03: 500 mg via INTRAVENOUS
  Filled 2011-04-03: qty 100

## 2011-04-03 MED ORDER — ACETAMINOPHEN 10 MG/ML IV SOLN
INTRAVENOUS | Status: AC
  Filled 2011-04-03: qty 100

## 2011-04-03 MED ORDER — MORPHINE SULFATE 2 MG/ML IJ SOLN
1.0000 mg | INTRAMUSCULAR | Status: DC | PRN
  Administered 2011-04-04: 2 mg via INTRAVENOUS
  Filled 2011-04-03: qty 1

## 2011-04-03 MED ORDER — PROPOFOL 10 MG/ML IV EMUL
INTRAVENOUS | Status: DC | PRN
  Administered 2011-04-03: 150 mg via INTRAVENOUS

## 2011-04-03 MED ORDER — ONDANSETRON HCL 4 MG/2ML IJ SOLN
INTRAMUSCULAR | Status: DC | PRN
  Administered 2011-04-03: 4 mg via INTRAVENOUS

## 2011-04-03 MED ORDER — CIPROFLOXACIN IN D5W 400 MG/200ML IV SOLN
400.0000 mg | Freq: Two times a day (BID) | INTRAVENOUS | Status: AC
  Administered 2011-04-03: 400 mg via INTRAVENOUS
  Filled 2011-04-03: qty 200

## 2011-04-03 MED ORDER — INDIGOTINDISULFONATE SODIUM 8 MG/ML IJ SOLN
INTRAMUSCULAR | Status: DC | PRN
  Administered 2011-04-03: 8 mg via INTRAVENOUS

## 2011-04-03 MED ORDER — ALVIMOPAN 12 MG PO CAPS
12.0000 mg | ORAL_CAPSULE | Freq: Two times a day (BID) | ORAL | Status: AC
  Administered 2011-04-04 – 2011-04-10 (×14): 12 mg via ORAL
  Filled 2011-04-03 (×16): qty 1

## 2011-04-03 MED ORDER — LIDOCAINE HCL (CARDIAC) 20 MG/ML IV SOLN
INTRAVENOUS | Status: DC | PRN
  Administered 2011-04-03: 60 mg via INTRAVENOUS

## 2011-04-03 MED ORDER — KCL IN DEXTROSE-NACL 20-5-0.45 MEQ/L-%-% IV SOLN
INTRAVENOUS | Status: AC
  Filled 2011-04-03: qty 1000

## 2011-04-03 MED ORDER — DROPERIDOL 2.5 MG/ML IJ SOLN
INTRAMUSCULAR | Status: AC
  Filled 2011-04-03: qty 2

## 2011-04-03 MED ORDER — ROCURONIUM BROMIDE 100 MG/10ML IV SOLN
INTRAVENOUS | Status: DC | PRN
  Administered 2011-04-03: 50 mg via INTRAVENOUS

## 2011-04-03 MED ORDER — NEOSTIGMINE METHYLSULFATE 1 MG/ML IJ SOLN
INTRAMUSCULAR | Status: DC | PRN
  Administered 2011-04-03: 4 mg via INTRAVENOUS

## 2011-04-03 MED ORDER — LACTATED RINGERS IV SOLN
INTRAVENOUS | Status: DC | PRN
  Administered 2011-04-03 (×3): via INTRAVENOUS

## 2011-04-03 MED ORDER — ONDANSETRON HCL 4 MG/2ML IJ SOLN: 4.0000 mg | INTRAMUSCULAR | Status: DC

## 2011-04-03 MED ORDER — ACETAMINOPHEN 10 MG/ML IV SOLN
1000.0000 mg | Freq: Four times a day (QID) | INTRAVENOUS | Status: AC
  Administered 2011-04-03 – 2011-04-04 (×5): 1000 mg via INTRAVENOUS
  Filled 2011-04-03 (×5): qty 100

## 2011-04-03 MED ORDER — HYDROMORPHONE HCL PF 1 MG/ML IJ SOLN
INTRAMUSCULAR | Status: AC
  Administered 2011-04-03: 0.5 mg via INTRAVENOUS
  Filled 2011-04-03: qty 1

## 2011-04-03 MED ORDER — METRONIDAZOLE IN NACL 5-0.79 MG/ML-% IV SOLN
500.0000 mg | Freq: Three times a day (TID) | INTRAVENOUS | Status: AC
  Administered 2011-04-03: 500 mg via INTRAVENOUS
  Filled 2011-04-03: qty 100

## 2011-04-03 MED ORDER — HYDROMORPHONE HCL PF 1 MG/ML IJ SOLN
0.2500 mg | INTRAMUSCULAR | Status: DC | PRN
  Administered 2011-04-03 (×2): 0.5 mg via INTRAVENOUS

## 2011-04-03 MED ORDER — BUPIVACAINE-EPINEPHRINE 0.25% -1:200000 IJ SOLN
INTRAMUSCULAR | Status: DC | PRN
  Administered 2011-04-03: 9 mL

## 2011-04-03 MED ORDER — VECURONIUM BROMIDE 10 MG IV SOLR
INTRAVENOUS | Status: DC | PRN
  Administered 2011-04-03: 2 mg via INTRAVENOUS
  Administered 2011-04-03: 1 mg via INTRAVENOUS
  Administered 2011-04-03 (×2): 2 mg via INTRAVENOUS
  Administered 2011-04-03: 1 mg via INTRAVENOUS
  Administered 2011-04-03: 2 mg via INTRAVENOUS

## 2011-04-03 MED ORDER — DROPERIDOL 2.5 MG/ML IJ SOLN
0.6250 mg | Freq: Once | INTRAMUSCULAR | Status: DC
  Administered 2011-04-03: 0.625 mg via INTRAVENOUS

## 2011-04-03 MED ORDER — FENTANYL CITRATE 0.05 MG/ML IJ SOLN
INTRAMUSCULAR | Status: DC | PRN
  Administered 2011-04-03: 50 ug via INTRAVENOUS
  Administered 2011-04-03: 150 ug via INTRAVENOUS
  Administered 2011-04-03 (×2): 100 ug via INTRAVENOUS
  Administered 2011-04-03 (×2): 50 ug via INTRAVENOUS

## 2011-04-03 MED ORDER — GLYCOPYRROLATE 0.2 MG/ML IJ SOLN
INTRAMUSCULAR | Status: DC | PRN
  Administered 2011-04-03: .7 mg via INTRAVENOUS

## 2011-04-03 MED ORDER — CIPROFLOXACIN IN D5W 400 MG/200ML IV SOLN
400.0000 mg | INTRAVENOUS | Status: AC
  Administered 2011-04-03: 400 mg via INTRAVENOUS
  Filled 2011-04-03: qty 200

## 2011-04-03 MED ORDER — KCL IN DEXTROSE-NACL 20-5-0.45 MEQ/L-%-% IV SOLN
INTRAVENOUS | Status: DC
  Administered 2011-04-03 – 2011-04-04 (×2): via INTRAVENOUS
  Filled 2011-04-03 (×4): qty 1000

## 2011-04-03 MED ORDER — ONDANSETRON HCL 4 MG PO TABS: 4.0000 mg | ORAL_TABLET | Freq: Four times a day (QID) | ORAL | Status: DC | PRN

## 2011-04-03 MED ORDER — ACETAMINOPHEN 10 MG/ML IV SOLN
INTRAVENOUS | Status: DC | PRN
  Administered 2011-04-03: 1000 mg via INTRAVENOUS

## 2011-04-03 MED ORDER — ONDANSETRON HCL 4 MG/2ML IJ SOLN
4.0000 mg | Freq: Four times a day (QID) | INTRAMUSCULAR | Status: DC | PRN
  Administered 2011-04-09: 4 mg via INTRAVENOUS
  Filled 2011-04-03 (×2): qty 2

## 2011-04-03 MED ORDER — ONDANSETRON HCL 4 MG/2ML IJ SOLN
INTRAMUSCULAR | Status: AC
  Filled 2011-04-03: qty 2

## 2011-04-03 MED ORDER — ONDANSETRON HCL 4 MG/2ML IJ SOLN
4.0000 mg | Freq: Once | INTRAMUSCULAR | Status: AC | PRN
  Administered 2011-04-03: 4 mg via INTRAVENOUS

## 2011-04-03 SURGICAL SUPPLY — 90 items
APPLIER CLIP ROT 10 11.4 M/L (STAPLE)
BLADE SURG 10 STRL SS (BLADE) ×2 IMPLANT
BLADE SURG ROTATE 9660 (MISCELLANEOUS) IMPLANT
CANISTER SUCTION 2500CC (MISCELLANEOUS) ×6 IMPLANT
CELLS DAT CNTRL 66122 CELL SVR (MISCELLANEOUS) IMPLANT
CHLORAPREP W/TINT 26ML (MISCELLANEOUS) ×2 IMPLANT
CLIP APPLIE ROT 10 11.4 M/L (STAPLE) IMPLANT
CLOTH BEACON ORANGE TIMEOUT ST (SAFETY) ×2 IMPLANT
COVER MAYO STAND STRL (DRAPES) IMPLANT
COVER SURGICAL LIGHT HANDLE (MISCELLANEOUS) ×4 IMPLANT
DECANTER SPIKE VIAL GLASS SM (MISCELLANEOUS) ×2 IMPLANT
DISSECTOR BLUNT TIP ENDO 5MM (MISCELLANEOUS) IMPLANT
DRAPE PROXIMA HALF (DRAPES) ×6 IMPLANT
DRAPE UTILITY 15X26 W/TAPE STR (DRAPE) ×4 IMPLANT
DRAPE WARM FLUID 44X44 (DRAPE) ×2 IMPLANT
DRESSING TELFA 8X3 (GAUZE/BANDAGES/DRESSINGS) ×2 IMPLANT
ELECT BLADE 6.5 EXT (BLADE) ×2 IMPLANT
ELECT CAUTERY BLADE 6.4 (BLADE) ×4 IMPLANT
ELECT REM PT RETURN 9FT ADLT (ELECTROSURGICAL) ×2
ELECTRODE REM PT RTRN 9FT ADLT (ELECTROSURGICAL) ×1 IMPLANT
GEL ULTRASOUND 20GR AQUASONIC (MISCELLANEOUS) IMPLANT
GLOVE BIO SURGEON STRL SZ 6.5 (GLOVE) ×6 IMPLANT
GLOVE BIO SURGEON STRL SZ7 (GLOVE) ×4 IMPLANT
GLOVE BIO SURGEON STRL SZ7.5 (GLOVE) ×12 IMPLANT
GLOVE BIOGEL M STRL SZ7.5 (GLOVE) ×10 IMPLANT
GLOVE BIOGEL PI IND STRL 7.0 (GLOVE) ×3 IMPLANT
GLOVE BIOGEL PI IND STRL 7.5 (GLOVE) ×3 IMPLANT
GLOVE BIOGEL PI IND STRL 8 (GLOVE) ×4 IMPLANT
GLOVE BIOGEL PI INDICATOR 7.0 (GLOVE) ×3
GLOVE BIOGEL PI INDICATOR 7.5 (GLOVE) ×3
GLOVE BIOGEL PI INDICATOR 8 (GLOVE) ×4
GLOVE ECLIPSE 7.5 STRL STRAW (GLOVE) ×4 IMPLANT
GLOVE EUDERMIC 7 POWDERFREE (GLOVE) ×4 IMPLANT
GLOVE SS BIOGEL STRL SZ 8 (GLOVE) ×2 IMPLANT
GLOVE SUPERSENSE BIOGEL SZ 8 (GLOVE) ×2
GOWN PREVENTION PLUS XLARGE (GOWN DISPOSABLE) ×8 IMPLANT
GOWN PREVENTION PLUS XXLARGE (GOWN DISPOSABLE) ×2 IMPLANT
GOWN STRL NON-REIN LRG LVL3 (GOWN DISPOSABLE) ×12 IMPLANT
GOWN STRL REIN XL XLG (GOWN DISPOSABLE) ×4 IMPLANT
KIT BASIN OR (CUSTOM PROCEDURE TRAY) ×2 IMPLANT
KIT ROOM TURNOVER OR (KITS) ×2 IMPLANT
LEGGING LITHOTOMY PAIR STRL (DRAPES) ×2 IMPLANT
LIGASURE 5MM LAPAROSCOPIC (INSTRUMENTS) IMPLANT
LIGASURE IMPACT 36 18CM CVD LR (INSTRUMENTS) ×2 IMPLANT
NS IRRIG 1000ML POUR BTL (IV SOLUTION) ×14 IMPLANT
PAD ARMBOARD 7.5X6 YLW CONV (MISCELLANEOUS) ×4 IMPLANT
PENCIL BUTTON HOLSTER BLD 10FT (ELECTRODE) ×2 IMPLANT
RELOAD PROXIMATE 75MM BLUE (ENDOMECHANICALS) ×2 IMPLANT
RETRACTOR WND ALEXIS 25 LRG (MISCELLANEOUS) ×1 IMPLANT
RTRCTR WOUND ALEXIS 18CM MED (MISCELLANEOUS)
RTRCTR WOUND ALEXIS 25CM LRG (MISCELLANEOUS) ×2
SCALPEL HARMONIC ACE (MISCELLANEOUS) IMPLANT
SCISSORS LAP 5X35 DISP (ENDOMECHANICALS) IMPLANT
SEPRAFILM PROCEDURAL PACK 3X5 (MISCELLANEOUS) ×2 IMPLANT
SET IRRIG TUBING LAPAROSCOPIC (IRRIGATION / IRRIGATOR) IMPLANT
SLEEVE ENDOPATH XCEL 5M (ENDOMECHANICALS) ×2 IMPLANT
SPECIMEN JAR LARGE (MISCELLANEOUS) ×2 IMPLANT
SPONGE GAUZE 4X4 12PLY (GAUZE/BANDAGES/DRESSINGS) ×2 IMPLANT
SPONGE LAP 18X18 X RAY DECT (DISPOSABLE) ×10 IMPLANT
STAPLER CIRC CVD 29MM 37CM (STAPLE) ×2 IMPLANT
STAPLER CUT CVD 40MM BLUE (STAPLE) ×2 IMPLANT
STAPLER PROXIMATE 75MM BLUE (STAPLE) ×2 IMPLANT
STAPLER VISISTAT 35W (STAPLE) ×2 IMPLANT
SURGILUBE 2OZ TUBE FLIPTOP (MISCELLANEOUS) ×6 IMPLANT
SUT NOVA 1 T20/GS 25DT (SUTURE) ×4 IMPLANT
SUT PDS AB 1 TP1 96 (SUTURE) ×4 IMPLANT
SUT PROLENE 2 0 CT2 30 (SUTURE) ×2 IMPLANT
SUT PROLENE 2 0 KS (SUTURE) ×2 IMPLANT
SUT SILK 2 0 (SUTURE) ×1
SUT SILK 2 0 SH CR/8 (SUTURE) ×2 IMPLANT
SUT SILK 2-0 18XBRD TIE 12 (SUTURE) ×1 IMPLANT
SUT SILK 3 0 (SUTURE) ×1
SUT SILK 3 0 SH CR/8 (SUTURE) ×2 IMPLANT
SUT SILK 3-0 18XBRD TIE 12 (SUTURE) ×1 IMPLANT
SUT VIC AB 3-0 SH 8-18 (SUTURE) IMPLANT
SYR BULB IRRIGATION 50ML (SYRINGE) ×2 IMPLANT
SYS LAPSCP GELPORT 120MM (MISCELLANEOUS)
SYSTEM LAPSCP GELPORT 120MM (MISCELLANEOUS) IMPLANT
TOWEL OR 17X24 6PK STRL BLUE (TOWEL DISPOSABLE) ×4 IMPLANT
TOWEL OR 17X26 10 PK STRL BLUE (TOWEL DISPOSABLE) ×2 IMPLANT
TRAY FOLEY CATH 14FRSI W/METER (CATHETERS) ×2 IMPLANT
TRAY LAPAROSCOPIC (CUSTOM PROCEDURE TRAY) ×2 IMPLANT
TRAY PROCTOSCOPIC FIBER OPTIC (SET/KITS/TRAYS/PACK) IMPLANT
TROCAR XCEL BLUNT TIP 100MML (ENDOMECHANICALS) ×2 IMPLANT
TROCAR XCEL NON-BLD 11X100MML (ENDOMECHANICALS) ×2 IMPLANT
TROCAR XCEL NON-BLD 5MMX100MML (ENDOMECHANICALS) ×2 IMPLANT
TUBE CONNECTING 12X1/4 (SUCTIONS) ×2 IMPLANT
TUBING FILTER THERMOFLATOR (ELECTROSURGICAL) ×2 IMPLANT
WATER STERILE IRR 1000ML POUR (IV SOLUTION) IMPLANT
YANKAUER SUCT BULB TIP NO VENT (SUCTIONS) ×4 IMPLANT

## 2011-04-03 NOTE — Progress Notes (Signed)
TRANSEFRRED - IN FROM PACU BY STRETCHER  LETHARGIC BUT AROUSABLE.

## 2011-04-03 NOTE — Interval H&P Note (Signed)
History and Physical Interval Note:  04/03/2011 7:51 AM  [name]  has presented today for surgery, with the diagnosis of Sigmoid colon cancer  The various methods of treatment have been discussed with the patient and family. After consideration of risks, benefits and other options for treatment, the patient has consented to  Procedure(s): COLON RESECTION LAPAROSCOPIC as a surgical intervention .  The patients' history has been reviewed, patient examined, no change in status, stable for surgery.  I have reviewed the patients' chart and labs.  Questions were answered to the patient's satisfaction.    Heparin gtt off. Labs good for surgery. Pt recd entereg.  Brandalyn Sella. [name], MD, FACS General, Bariatric, & Minimally Invasive Surgery [facility], [place]  [facility]

## 2011-04-03 NOTE — Anesthesia Preprocedure Evaluation (Addendum)
Anesthesia Evaluation  Patient identified by MRN, date of birth, ID band Patient awake    Reviewed: Allergy & Precautions, H&P , NPO status , Patient's Chart, lab work & pertinent test results  History of Anesthesia Complications (+) AWARENESS UNDER ANESTHESIA  Airway Mallampati: I TM Distance: >3 FB Neck ROM: full    Dental  (+) Teeth Intact and Dental Advisory Given   Pulmonary shortness of breath,    Pulmonary exam normal       Cardiovascular hypertension, +CHF + dysrhythmias Atrial Fibrillation + pacemaker irregular Normal    Neuro/Psych Negative Neurological ROS     GI/Hepatic Neg liver ROS,   Endo/Other  Negative Endocrine ROS  Renal/GU negative Renal ROS  Genitourinary negative   Musculoskeletal   Abdominal   Peds  Hematology negative hematology ROS (+)   Anesthesia Other Findings   Reproductive/Obstetrics                         Anesthesia Physical Anesthesia Plan  ASA: III  Anesthesia Plan: General ETT   Post-op Pain Management:    Induction: Intravenous  Airway Management Planned: Oral ETT  Additional Equipment:   Intra-op Plan:   Post-operative Plan:   Informed Consent:   Plan Discussed with: Anesthesiologist, CRNA and Surgeon  Anesthesia Plan Comments:         Anesthesia Quick Evaluation

## 2011-04-03 NOTE — OR Nursing (Signed)
Case changed over from laparoscopic to open at 0908.

## 2011-04-03 NOTE — Progress Notes (Signed)
SUBJECTIVE:  In PACU doing well.  Able to respond to questions. OBJECTIVE:   Vitals:   Filed Vitals:   04/03/11 1715 04/03/11 1730 04/03/11 1745 04/03/11 1800  BP: 145/64 138/64 146/62 147/66  Pulse: 60 60 60 60  Temp:      TempSrc:      Resp: 10 10 14 19   Height:      Weight:      SpO2: 96% 96% 96% 96%   I&O's:    Intake/Output Summary (Last 24 hours) at 04/03/11 1809 Last data filed at 04/03/11 1800  Gross per 24 hour  Intake   3632 ml  Output   1365 ml  Net   2267 ml   TELEMETRY:  currently paced rhythm.:     PHYSICAL EXAM General: [place]:    Normal cephalic and atramatic  Lungs:   Clear bilaterally to auscultation. Heart:  Regular rate and rhythm, S1-S2   Extremities:   No edema.   Neuro: Alert and oriented X 3. Psych:  Good affect, responds appropriately   LABS: Basic Metabolic Panel:  Basename 04/03/11 1400 04/01/11 0754  NA 137 139  K 3.9 3.7  CL 103 105  CO2 24 27  GLUCOSE 174* 103*  BUN 9 7  CREATININE 0.80 0.85  CALCIUM 8.5 9.1  MG 1.8 2.0  PHOS -- --   Liver Function Tests:  [address] [facility] 04/01/11 0754  AST 31  ALT 19  ALKPHOS 50  BILITOT 0.2*  PROT 6.6  ALBUMIN 3.1*   No results found for this basename: LIPASE:2,AMYLASE:2 in the last 72 hours CBC:  Basename 04/03/11 1400 04/03/11 0510  WBC 13.5* 9.7  NEUTROABS -- --  HGB 10.6* 10.6*  HCT 34.5* 34.8*  MCV 81.2 81.3  PLT 200 201   Cardiac Enzymes: No results found for this basename: CKTOTAL:3,CKMB:3,CKMBINDEX:3,TROPONINI:3 in the last 72 hours BNP: No components found with this basename: POCBNP:3 D-Dimer: No results found for this basename: DDIMER:2 in the last 72 hours Hemoglobin A1C: No results found for this basename: HGBA1C in the last 72 hours Fasting Lipid Panel: No results found for this basename: CHOL,HDL,LDLCALC,TRIG,CHOLHDL,LDLDIRECT in the last 72 hours Thyroid Function Tests: No results found for this basename: TSH,T4TOTAL,FREET3,T3FREE,THYROIDAB in the last  72 hours Anemia Panel: No results found for this basename: VITAMINB12,FOLATE,FERRITIN,TIBC,IRON,RETICCTPCT in the last 72 hours Coag Panel:   Lab Results  Component Value Date   INR 1.52* 04/03/2011   INR 1.63* 04/02/2011   INR 1.57* 04/02/2011       ASSESSMENT: Atrial fibrillation,  colon cancer   PLAN:  Continue sotalol.  She is off of Coumadin due to surgery.  Restart heparin when safe from a bleeding standpoint.  Now in a paced rhythm.  She does have underlying atrial fibrillation. Heparin/coumadin for stroke prevention after surgery when safe from the surgeon's perspective.    AFib under control.  Will follow along with surgeons as I anticipate she will go to their service, as colon CA surgery is primary issue.  Corky Crafts., MD  04/03/2011  6:09 PM

## 2011-04-03 NOTE — Op Note (Signed)
[name], CAPPELLI                  ACCOUNT NO.:  [id number]  MEDICAL RECORD NO.:  [id number]  LOCATION:  2917                         FACILITY:  [facility]  PHYSICIAN:  Shaquera Sella. [name], MD     DATE OF BIRTH:  [name]  DATE OF PROCEDURE:  04/03/2011 DATE OF DISCHARGE:                              OPERATIVE REPORT   PREOPERATIVE DIAGNOSIS:  Sigmoid colon cancer, clinical stage I.  POSTOPERATIVE DIAGNOSES: 1. Sigmoid colon cancer, clinical stage I. 2. Extensive intra-abdominal adhesions.  PROCEDURE: 1. Diagnostic laparoscopy. 2. Extensive lysis of adhesions for 2-1/2 hours. 3. Sigmoid colectomy. 4. Rigid proctoscopy.  SURGEON:  Sharmane Sella. [name], MD, FACS.  ASSISTANT SURGEONS: 1. Ollen Gross. Vernell Morgans, MD, FACS. 2. Angelia Mould. Derrell Lolling, MD, FACS. 3. Marta Lamas. Lindie Spruce, MD, FACS.  ANESTHESIA:  General plus 0.25% Marcaine with epi.  ESTIMATED BLOOD LOSS:  375.  BLOOD ADMINISTERED:  1 unit of FFP.  SPECIMEN: 1. Sigmoid colon.  Stitch marks distal margin. 2. Two additional sections of proximal colon.  FINDINGS:  The patient had extensive intra-abdominal adhesions.  It took [place] approximately 2-1/2 hours to lyse all her small bowel and adhesions. There is no evidence of metastatic disease.  The sigmoid colon mass was readily palpable.  An end-to-side anastomosis was performed using a 29- mm EEA stapler at 20cm from the anal verge.  There was no signs of air bubbles on rigid proctoscopy.  INDICATIONS FOR PROCEDURE:  The patient is an 78 year old Caucasian female with a prior remote history of ovarian cancer treated with abdominal hysterectomy and postoperative chemotherapy.  She was admitted for shortness of breath.  She was found to be anemic.  A colonoscopy was performed, which demonstrated a mass in her sigmoid colon.  However, complete colonoscopy was not able to be completed due to a tortuous colon.  The biopsy of the sigmoid colon mass was consistent with adenocarcinoma.  A staging work  was performed, which demonstrated no evidence of distant disease.  She was cleared by Cardiology for sigmoid colectomy.  After her bowel prep for her colonoscopy, she was kept on a liquid diet, and she was given a dose of Entereg preoperatively.  We discussed at length the risks and benefits of surgery including, but not limited to bleeding, infection, injury to surrounding structures, anastomotic stricture, anastomotic leakage, possible need for ostomy, incisional hernia, prolonged hospitalization, postoperative ileus, pneumonia, cardiac complications, possible death, and possible need for additional procedures.  Blood clot formation and risk of general anesthesia as well as incisional hernia, wound infection risk.  The patient and her family elected to proceed to the operating room.  DESCRIPTION OF PROCEDURE:  After obtaining informed consent, the patient was brought to the operating room.  Her heparin drip had been stopped preoperatively.  As stated above, she received a dose of Entereg.  She was then taken to the operating room.  General endotracheal anesthesia was established.  A Foley catheter was placed. She was placed in lithotomy position with appropriate padding.  Sequential compression devices were placed.  Her abdomen and perineum were prepped and draped in usual standard surgical fashion.  ChloraPrep was used for abdomen and Betadine on  her perineum.  She received Cipro and Flagyl prior to skin incision.  Surgical time-out was performed.  Interestingly, her prior midline incision was actually not in the patient's true midline.  I infiltrated local at the base of her umbilicus then made a 1/2-inch vertical infraumbilical incision with a #11 blade, lifted up the fascia with 2 Kochers and entered the abdominal cavity easily and placed 0 Vicryl pursestring suture around the fascial edges and placed a 12-mm Hasson trocar, and this easily established pneumoperitoneum up to a  patient pressure of 15 mmHg.  Laparoscope was advanced.  There was some evidence of adhesions but it was difficult to tell the extent of the amount of adhesions.  Therefore, I placed two 5- mm trocars in the right midabdomen all under direct visualization after local had been infiltrated.  Then, using atraumatic bowel graspers, we inspected the abdomen, first there is no evidence of disease within the liver.  There is no evidence of peritoneal studding.  The patient apparently had no omentum and had probably been excised during her hysterectomy for her ovarian cancer.  Upon further inspection of abdomen, her transverse colon was adhered to her upper midline and left upper quadrant.  Her small bowel was tethered in her pelvis, and her sigmoid colon was tethered in her left abdomen; therefore, I have elected to convert to an open procedure given the extensive nature of the intra-abdominal adhesions.  A lower midline incision was made.  I started in her true midline and connected the incision to her old incision, which was slightly to the left of her abdominal wall. Subcutaneous tissue was divided and the fascia was entered.  We then started lysing adhesions.  We initially took down some of the transverse colon that was tethered to the upper abdominal wall.  Some of the peritoneum was violated in getting it from the abdominal wall.  At this point, Dr. Carolynne Edouard had joined me already in the operating room.  I continued to lyse adhesions.  She had the midportion of her small bowel tethered in her pelvis to what appeared to peritoneum as well as to her decompressed bladder.  There are also intra-loop adhesions as well.  Dr. Carolynne Edouard had to leave the operating room but Dr. Derrell Lolling joined me since he was available, and we continued to lyse the adhesions.  I ended up lysing the small bowel adhesions and trying to mobilize her small bowel about the pelvis for about 2 hours.  I then spent about another hour  lysing intra-abdominal adhesions and freeing up the colon. At this point, Dr. Lindie Spruce joined me.  We had already placed a Bookwalter. We had already identified the sigmoid colon lesion and felt the mass. At this point, I took down the lateral attachments of the sigmoid colon and mobilized up the white line of Toldt.  There is also some small bowel tether to the medial aspect of the sigmoid colon.  It was freed using Metzenbaum scissors.  We had identified the right ureter.  We continued lysing adhesions and mobilizing the small bowel. Interestingly, a portion of her small bowel was tethered and adhered to her right ureter.  There is no evidence of hydronephrosis.  I elected to transect the proximal colon by making a small rent in the mesentery several inches above the mass approximately 5 cm proximal to the mass.  I transected the colon with a GIA stapler linear cutter 75 mm with a blue load.  We then took down the mesocolon  in a serial fashion using the LigaSure device.  The right ureter and the left iliac was identified.  However, I did not see the left ureter.  I ended up staying close to the colon. The upper rectum was mobile and not frozen.  I was able to be continue to mobilize the distal sigmoid colon and the upper rectum from the lateral pelvic wall using a combination of Metzenbaum scissors and electrocautery.  We continued to take down the mesocolon in a sterile fashion ensuring that what we are going through do not contain ureter. We did ask Anesthesia to inject indigo carmine.  I was able to finally get distal to the lesion and approximately another 5 cm distal to the mass.  I ended up going across the distal sigmoid colon with a contour stapler with a blue load.  This freed the specimen.  The distal margin was stitched with a 2-0 silk, and the specimen was labeled as sigmoid colon.  We ended up inspecting the pelvis.  There was just a little bit of bleeding in the pelvis from  one area of the decompressed bladder as well as the peritoneum. Electrocautery was used as well as two 3-0 silk sutures for hemostasis. I then placed the spring clamp across the proximal several inches above from the proximal colon staple line.  Then, I placed a pursestring clamp just below the staple line.  I then threaded a 2-0 Prolene Keith needle through the purse-string clamp, excised and cutoff the staple line.  I opened it up, sized it, and it accommodated a 29-mm dilator quite easily. The 29-mm Ethicon stapler was opened.  The anvil was placed into the proximal colon and tied down securing the anvil.  At this point, I went below and performed a rigid proctoscopy.  I insufflated the rectum and was able to advance the proctoscope, and the rectum distended.  There is no evidence of leakage from the rectal stump.  I then withdrew the rigid proctoscope at this point.  During this time, the Prolene suture that had been used to secure the anvil in the proximal colon gave away.  The anvil was removed and the previously placed pursestring suture was removed.  At this point, Dr. Lindie Spruce placed a running baseball stitch using a 2-0 Prolene, and I placed the anvil and tied it down. The anvil was quite well secured at this point.  At this point, I placed a 29-mm stapler up through the anus, rectum, and gently advanced it up through the rectum.  I did not advance it all the way to the transected upper rectum because part of the end of it was adhered to the pelvic sidewall.  However, there was freely mobile section about 4 cm beneath that staple line.  The stapler easily reached that area.  I advanced the spike through the upper rectum.  The spike was connected to the anvil and secured.  I then closed the stapler.  We ensured that the proximal colon was not twisted, and there was nothing between the proximal colon and the upper rectum.  The stapler was closed, fired it, and the stapler easily  removed from his rectum.  I inspected and found two circumferential intact doughnuts both proximal and distal.  At this point, I reinserted the rigid proctoscope, advanced it up into the rectum, insufflated the rectum after Dr. Lindie Spruce had placed a spring clamp across the proximal colon above the anastomosis.  Saline was placed into the pelvis and I insufflated  it again.  The rectum and the anastomosis distended quite easily.  There was no evidence of bubbles.  The anastomosis was at 20 cm from the anal verge.  The rigid proctoscope was removed.  We changed gloves, and the abdominal cavity was thoroughly irrigated with 4 L of saline.  At this point, we ran the small bowel to ensure that there is no evidence of any enterotomies or serosal tears. There is no evidence of a serosal tear.  However, there one area of the proximal small bowel where there is a closed loop and a space for an internal hernia to form, and we ended up opening this area. While there is no evidence of a serosal tear, I did place three interrupted 3-0 silk Lembert sutures in the midjejunum.  There was a very superficial serosal tear.  There was also an internal hernia in the transverse colon.  We ended up opening this up, so that the space was obliterated.  I ran the bowel again.  There is no evidence of serosal tear or enterotomies.  I irrigated the abdomen one last time.  Since the patient had no omentum, I placed Seprafilm in the abdominal cavity.  I then closed the fascia using 2 running looped PDS using a #1 looped PDS, one from above, one from below.  I placed several interrupted internal #1 Novafil as internal retention sutures.  The subcutaneous tissue was irrigated, and the skin was reapproximated with skin staples.  The two port sites on the right side were closed with skin staples and Telfa wicks were placed between some of the surgical skin staples followed by dry dressing.  The patient was extubated and taken  to recovery room in stable condition.  All needle, instrument, and sponge counts were correct x2.  The patient tolerated the procedure well.     Lynise Sella. [name], MD     EMW/MEDQ  D:  04/03/2011  T:  04/03/2011  Job:  [id number]  cc:   Dr. Madilyn Fireman

## 2011-04-03 NOTE — Progress Notes (Signed)
ANTICOAGULATION CONSULT NOTE - Follow Up Consult  Pharmacy Consult for Heparin Indication: atrial fibrillation  Allergies  Allergen Reactions  . Lasix (Furosemide) Swelling  . Penicillins Swelling    Mouth swelling and redness  . Sulfa Antibiotics Rash    Patient Measurements: Height: 5\' 7"  (170.2 cm) Weight: 183 lb 3.2 oz (83.1 kg) IBW/kg (Calculated) : 61.6   Vital Signs: Temp: 98.4 F (36.9 C) (01/18 0530) Temp src: Oral (01/18 0530) BP: 122/66 mmHg (01/18 0530) Pulse Rate: 68  (01/18 0530)  Labs:  Basename 04/03/11 0510 04/02/11 1957 04/02/11 0804 04/02/11 0645 04/01/11 0754  HGB 10.6* -- -- 11.1* --  HCT 34.8* -- -- 36.9 35.2*  PLT 201 -- -- 224 209  APTT -- -- -- -- --  LABPROT 18.6* -- 19.6* 19.1* --  INR 1.52* -- 1.63* 1.57* --  HEPARINUNFRC <0.10* 0.35 0.26* -- --  CREATININE -- -- -- -- 0.85  CKTOTAL -- -- -- -- --  CKMB -- -- -- -- --  TROPONINI -- -- -- -- --   Estimated Creatinine Clearance: 52.7 ml/min (by C-G formula based on Cr of 0.85).   Medications:  Scheduled:     . alvimopan  12 mg Oral Once  . calcium-vitamin D  1 tablet Oral BID  . ciprofloxacin  400 mg Intravenous To OR  . diltiazem  180 mg Oral Daily  . lubiprostone  24 mcg Oral BID WC  . metronidazole  500 mg Intravenous To OR  . polysaccharide iron  150 mg Oral Q0700  . ramipril  10 mg Oral Daily  . sodium chloride  3 mL Intravenous Q12H  . sotalol  160 mg Oral BID  . DISCONTD: alvimopan  12 mg Oral Once  . DISCONTD: ertapenem  1 g Intravenous 60 min Pre-Op    Assessment: 78 y/o female patient on chronic coumadin for h/o afib, has been on hold in anticipation of EGD now sx required. Heparin started for bridge therapy, turned off 1/17 am d/t plans for sx, but sx changed until 1/18.  Heparin level is therapeutic = 0.35 after being restarted 1/17 am.   Today 1/18 HL < 0.1  Heparin was turned off for surgery - pt now in OR.  Goal of Therapy:  Heparin level 0.3-0.7 units/ml     Plan:  Will follow up after surgery for restart [facility]  Carney [place]ers.D.  BCPS, CPP 04/03/2011 9:20 AM

## 2011-04-03 NOTE — Transfer of Care (Signed)
Immediate Anesthesia Transfer of Care Note  Patient: [name]  Procedure(s) Performed:  COLON RESECTION LAPAROSCOPIC - Laparoscopic, turned open sigmoid colon resection, lysis of adhesions x 2.5 hours, rigid proctoscopy.  Patient Location: PACU  Anesthesia Type: General  Level of Consciousness: lethargic and responds to stimulation  Airway & Oxygen Therapy: Patient Spontanous Breathing and Patient connected to face mask oxygen  Post-op Assessment: Report given to PACU RN  Post vital signs: Reviewed and stable Filed Vitals:   04/03/11 1315  BP:   Pulse:   Temp: 36.8 C  Resp:     Complications: No apparent anesthesia complications

## 2011-04-03 NOTE — Brief Op Note (Signed)
03/27/2011 - 04/03/2011  1:25 PM  PATIENT:  [name]  78 y.o. female  PRE-OPERATIVE DIAGNOSIS:  Sigmoid colon cancer  POST-OPERATIVE DIAGNOSIS:  Sigmoid colon cancer Extensive Intra-abdominal adhesions  PROCEDURE:  Procedure(s): Diagnostic Laparoscopy Extensive lysis of adhesions x 2.5 hrs Sigmoid colectomy Rigid Proctoscopy   SURGEON:   Atilano Ina, MD  PHYSICIAN ASSISTANT: none  ASSISTANTS: Griselda Miner, MD Claud Kelp, MD Cherylynn Ridges III, MD   ANESTHESIA:   general  EBL:  Total I/O In: 3112 [I.V.:2800; Blood:312] Out: 745 [Urine:395; Blood:350]  BLOOD ADMINISTERED:1 unit FFP  DRAINS: Nasogastric Tube and Urinary Catheter (Foley)   LOCAL MEDICATIONS USED:  OTHER 0.25% marcaine with epi  SPECIMEN:  Source of Specimen:  sigmoid colon - stitch marks distal margin 2. Additional proximal colon x 2  DISPOSITION OF SPECIMEN:  PATHOLOGY  COUNTS:  YES  TOURNIQUET:  * No tourniquets in log *  DICTATION: .Other Dictation: Dictation Number [id number]  PLAN OF CARE: Admit to inpatient   PATIENT DISPOSITION:  SDU - stable   Delay start of Pharmacological VTE agent (>24hrs) due to surgical blood loss or risk of bleeding:  YES  [name]. Carla Campanile, MD, FACS General, Bariatric, & Minimally Invasive Surgery [facility], [place]

## 2011-04-03 NOTE — Progress Notes (Signed)
Pts heparin turned off for pending surgery.

## 2011-04-03 NOTE — Anesthesia Postprocedure Evaluation (Signed)
  Anesthesia Post-op Note  Patient: [name]  Procedure(s) Performed:  COLON RESECTION LAPAROSCOPIC - Laparoscopic, turned open sigmoid colon resection, lysis of adhesions x 2.5 hours, rigid proctoscopy.  Patient Location: PACU  Anesthesia Type: General  Level of Consciousness: awake, oriented, sedated and patient cooperative  Airway and Oxygen Therapy: Patient Spontanous Breathing and Patient connected to nasal cannula oxygen  Post-op Pain: mild  Post-op Assessment: Post-op Vital signs reviewed, Patient's Cardiovascular Status Stable, Respiratory Function Stable, Patent Airway, No signs of Nausea or vomiting and Pain level controlled  Post-op Vital Signs: stable  Complications: No apparent anesthesia complications

## 2011-04-03 NOTE — Preoperative (Signed)
Beta Blockers   Reason not to administer Beta Blockers:Not Applicable 

## 2011-04-04 LAB — CBC
Hemoglobin: 9.8 g/dL — ABNORMAL LOW (ref 12.0–15.0)
Platelets: 196 10*3/uL (ref 150–400)
RBC: 3.95 MIL/uL (ref 3.87–5.11)
WBC: 10.8 10*3/uL — ABNORMAL HIGH (ref 4.0–10.5)

## 2011-04-04 LAB — BASIC METABOLIC PANEL
BUN: 9 mg/dL (ref 6–23)
CO2: 27 mEq/L (ref 19–32)
Chloride: 103 mEq/L (ref 96–112)
GFR calc Af Amer: 60 mL/min — ABNORMAL LOW (ref >90)
Potassium: 4.3 mEq/L (ref 3.5–5.1)

## 2011-04-04 LAB — PROTIME-INR
INR: 2.87 — ABNORMAL HIGH (ref 0.00–1.49)
Prothrombin Time: 30.5 seconds — ABNORMAL HIGH (ref 11.6–15.2)

## 2011-04-04 LAB — GLUCOSE, CAPILLARY: Glucose-Capillary: 104 mg/dL — ABNORMAL HIGH (ref 70–99)

## 2011-04-04 LAB — MAGNESIUM: Magnesium: 1.9 mg/dL (ref 1.5–2.5)

## 2011-04-04 MED ORDER — POTASSIUM CHLORIDE IN NACL 20-0.9 MEQ/L-% IV SOLN
INTRAVENOUS | Status: DC
  Administered 2011-04-04 – 2011-04-07 (×6): via INTRAVENOUS
  Filled 2011-04-04 (×8): qty 1000

## 2011-04-04 NOTE — Progress Notes (Signed)
Subjective:  Tolerated OR well yesterday.  No chest pain, c/odry mouth.  Objective:  Vital Signs in the last 24 hours: BP 119/53  Pulse 59  Temp(Src) 98.2 F (36.8 C) (Oral)  Resp 19  Ht 5\' 7"  (1.702 m)  Wt 86.1 kg (189 lb 13.1 oz)  BMI 29.73 kg/m2  SpO2 99%  Physical Exam: Pleasant WF in NAD NG in place Lungs:  Clear to A&P Cardiac:  Regular rhythm, normal S1 and S2, no S3 Extremities:  No edema present  Intake/Output from previous day: 01/18 0701 - 01/19 0700 In: 4937 [I.V.:3915; Blood:312; NG/GT:110; IV Piggyback:600] Out: 1105 [Urine:660; Emesis/NG output:95; Blood:350]  Lab Results: Basic Metabolic Panel:  Basename 04/04/11 0500 04/03/11 1400  NA 136 137  K 4.3 3.9  CL 103 103  CO2 27 24  GLUCOSE 153* 174*  BUN 9 9  CREATININE 0.96 0.80    CBC:  Basename 04/04/11 0500 04/03/11 1400  WBC 10.8* 13.5*  NEUTROABS -- --  HGB 9.8* 10.6*  HCT 32.2* 34.5*  MCV 81.5 81.2  PLT 196 200    PROTIME: Lab Results  Component Value Date   INR 2.87* 04/04/2011   INR 1.52* 04/03/2011   INR 1.63* 04/02/2011    Telemetry: Reviewed paced rhythm.NOt in a fib.  Assessment/Plan:  1. Volume status appears stable 2. Diastolic CHF 3.Paroxysmal afib/flutter  Rec:  Restart warfarin when safe from surgery viewpoint.  Continue sotalol.     Darden Palmer.  MD [facility] 04/04/2011, 9:40 AM

## 2011-04-04 NOTE — Progress Notes (Signed)
1 Day Post-Op  Subjective: No issues overnight. Pain controlled. No flatus.   Objective: Vital signs in last 24 hours: Temp:  [97.5 F (36.4 C)-98.5 F (36.9 C)] 98 F (36.7 C) (01/19 0400) Pulse Rate:  [58-62] 60  (01/19 0600) Resp:  [10-21] 14  (01/19 0600) BP: (102-171)/(44-77) 118/50 mmHg (01/19 0600) SpO2:  [94 %-99 %] 99 % (01/19 0600) Weight:  [189 lb 13.1 oz (86.1 kg)] 189 lb 13.1 oz (86.1 kg) (01/19 0500) Last BM Date: 04/02/11 (very small amt pt was on liquid diet)  Intake/Output from previous day: 01/18 0701 - 01/19 0700 In: 4937 [I.V.:3915; Blood:312; NG/GT:110; IV Piggyback:600] Out: 1105 [Urine:660; Emesis/NG output:95; Blood:350] Intake/Output this shift:    Alert, NAD, appropriate CTA Paced, nml rate Soft,nd, dressing with some shadowing, hypoBS No edema  Lab Results:   Basename 04/04/11 0500 04/03/11 1400  WBC 10.8* 13.5*  HGB 9.8* 10.6*  HCT 32.2* 34.5*  PLT 196 200   BMET  Basename 04/04/11 0500 04/03/11 1400  NA 136 137  K 4.3 3.9  CL 103 103  CO2 27 24  GLUCOSE 153* 174*  BUN 9 9  CREATININE 0.96 0.80  CALCIUM 8.0* 8.5   PT/INR  Basename 04/04/11 0500 04/03/11 0510  LABPROT 30.5* 18.6*  INR 2.87* 1.52*   ABG No results found for this basename: PHART:2,PCO2:2,PO2:2,HCO3:2 in the last 72 hours  Studies/Results: No results found.  Anti-infectives: Anti-infectives     Start     Dose/Rate Route Frequency Ordered Stop   04/03/11 1800   ciprofloxacin (CIPRO) IVPB 400 mg        400 mg 200 mL/hr over 60 Minutes Intravenous Every 12 hours 04/03/11 1649 04/03/11 2041   04/03/11 1800   metroNIDAZOLE (FLAGYL) IVPB 500 mg        500 mg 100 mL/hr over 60 Minutes Intravenous Every 8 hours 04/03/11 1649 04/03/11 1854   04/03/11 0815   ciprofloxacin (CIPRO) IVPB 400 mg        400 mg 200 mL/hr over 60 Minutes Intravenous To Surgery 04/03/11 0810 04/03/11 0825   04/03/11 0815   metroNIDAZOLE (FLAGYL) IVPB 500 mg        500 mg 100 mL/hr  over 60 Minutes Intravenous To Surgery 04/03/11 0810 04/03/11 0842   04/01/11 1921   ertapenem (INVANZ) 1 g in sodium chloride 0.9 % 50 mL IVPB  Status:  Discontinued        1 g 100 mL/hr over 30 Minutes Intravenous 60 min pre-op 04/01/11 1921 04/03/11 1610          Assessment/Plan: s/p Procedure(s): SIGMOID COLON RESECTION, EXTENSIVE LYSIS OF ADHESIONS  tx pt to [facility] service Pulm -pulm toilet, IS, out of bed CV - rate controlled, on b-blocker, BP good. Not on cardizem yet. Will defer to cards. If restart - will need IV or immediate release tablet for now; interestingly pt's INR is 2.87 this am - so will not place on hep gtt for afib. Repeat INR in AM Heme - hgb down a little. Monitor GI - minimal NG output but given extensive nature of LOA - will maintain ng tube; cont entereg - pt to take by mouth Renal - good uop. Cr ok.  Cont foley until POD 3 - low pelvic surgery and extensive adhesions to bladder Endo - blood sugars slightly elevated, will change IVF  Keep in SDU today. Anticipate tx to floor Sunday.  Anabia Sella. [name], MD, FACS General, Bariatric, & Minimally Invasive Surgery [facility]  [facility], [place]     LOS: 8 days    Gaynelle Adu M 04/04/2011

## 2011-04-05 ENCOUNTER — Other Ambulatory Visit: Payer: Self-pay

## 2011-04-05 LAB — CBC
Hemoglobin: 9.6 g/dL — ABNORMAL LOW (ref 12.0–15.0)
MCHC: 30.3 g/dL (ref 30.0–36.0)
RBC: 3.84 MIL/uL — ABNORMAL LOW (ref 3.87–5.11)
WBC: 11.3 10*3/uL — ABNORMAL HIGH (ref 4.0–10.5)

## 2011-04-05 LAB — BASIC METABOLIC PANEL
GFR calc Af Amer: 86 mL/min — ABNORMAL LOW (ref >90)
GFR calc non Af Amer: 74 mL/min — ABNORMAL LOW (ref >90)
Potassium: 3.9 mEq/L (ref 3.5–5.1)
Sodium: 138 mEq/L (ref 135–145)

## 2011-04-05 LAB — APTT: aPTT: 50 seconds — ABNORMAL HIGH (ref 24–37)

## 2011-04-05 LAB — PROTIME-INR
INR: 2.23 — ABNORMAL HIGH (ref 0.00–1.49)
Prothrombin Time: 25.1 seconds — ABNORMAL HIGH (ref 11.6–15.2)

## 2011-04-05 MED ORDER — DIPHENHYDRAMINE HCL 12.5 MG/5ML PO ELIX
12.5000 mg | ORAL_SOLUTION | Freq: Four times a day (QID) | ORAL | Status: DC | PRN
  Filled 2011-04-05: qty 5

## 2011-04-05 MED ORDER — SODIUM CHLORIDE 0.9 % IV BOLUS (SEPSIS)
250.0000 mL | Freq: Once | INTRAVENOUS | Status: AC
  Administered 2011-04-05: 250 mL via INTRAVENOUS

## 2011-04-05 MED ORDER — SODIUM CHLORIDE 0.9 % IJ SOLN: 9.0000 mL | INTRAMUSCULAR | Status: DC | PRN

## 2011-04-05 MED ORDER — DIPHENHYDRAMINE HCL 50 MG/ML IJ SOLN: 12.5000 mg | Freq: Four times a day (QID) | INTRAMUSCULAR | Status: DC | PRN

## 2011-04-05 MED ORDER — ONDANSETRON HCL 4 MG/2ML IJ SOLN: 4.0000 mg | Freq: Four times a day (QID) | INTRAMUSCULAR | Status: DC | PRN

## 2011-04-05 MED ORDER — MORPHINE SULFATE (PF) 1 MG/ML IV SOLN
INTRAVENOUS | Status: DC
  Filled 2011-04-05: qty 25

## 2011-04-05 MED ORDER — MORPHINE SULFATE 2 MG/ML IJ SOLN: 1.0000 mg | INTRAMUSCULAR | Status: DC | PRN

## 2011-04-05 MED ORDER — HALOPERIDOL LACTATE 5 MG/ML IJ SOLN
4.0000 mg | Freq: Once | INTRAMUSCULAR | Status: AC
  Administered 2011-04-05: 4 mg via INTRAVENOUS

## 2011-04-05 MED ORDER — HALOPERIDOL LACTATE 5 MG/ML IJ SOLN
INTRAMUSCULAR | Status: AC
  Administered 2011-04-05: 4 mg via INTRAVENOUS
  Filled 2011-04-05: qty 1

## 2011-04-05 MED ORDER — NALOXONE HCL 0.4 MG/ML IJ SOLN: 0.4000 mg | INTRAMUSCULAR | Status: DC | PRN

## 2011-04-05 NOTE — Progress Notes (Signed)
Physical Therapy Evaluation Patient Details Name: [name] MRN: [id number] DOB: August 15, 1922 Today's Date: 04/05/2011  Problem List:  Patient Active Problem List  Diagnoses  . Pacemaker  . Edema  . Hypertension  . Atrial fibrillation  . IBS (irritable bowel syndrome)  . Osteoporosis  . Atrial flutter with rapid ventricular response  . Ovarian cancer  . Diastolic CHF, acute on chronic  . Anemia  . Colonic mass    Past Medical History:  Past Medical History  Diagnosis Date  . Irregular heart rhythm   . Diastolic heart failure   . Shortness of breath   . Ovarian cancer   . Ovarian cancer   . Hypertension   . Atrial fibrillation   . Tachy-brady syndrome   . CHF (congestive heart failure)     diastolic heart failure   Past Surgical History:  Past Surgical History  Procedure Date  . Cardiac pacemaker placement   . Abdominal hysterectomy   . Oophorectomy   . Colonoscopy 03/30/2011    Procedure: COLONOSCOPY;  Surgeon: Barrie Folk, MD;  Location: [facility] ENDOSCOPY;  Service: Endoscopy;  Laterality: N/A;  . Esophagogastroduodenoscopy 03/30/2011    Procedure: ESOPHAGOGASTRODUODENOSCOPY (EGD);  Surgeon: Barrie Folk, MD;  Location: [facility] ENDOSCOPY;  Service: Endoscopy;  Laterality: N/A;    PT Assessment/Plan/Recommendation PT Assessment Clinical Impression Statement: Patient is an 78 yo female admitted with A-fib with RVR now with new dx of colon CA, s/p colectomy and resection of adhesions.  Patient lives alone and has been active pta.  Patient now with general weakness, decreased balance, and decreased mobility.  Recommend patient discharge to SNF for continued therapy and reassess ability to return home alone from there. PT Recommendation/Assessment: Patient will need skilled PT in the acute care venue PT Problem List: Decreased strength;Decreased activity tolerance;Decreased balance;Decreased mobility;Decreased knowledge of use of DME;Pain Barriers to Discharge: Decreased  caregiver support PT Therapy Diagnosis : Difficulty walking;Abnormality of gait;Generalized weakness;Acute pain PT Plan PT Frequency: Min 3X/week PT Treatment/Interventions: DME instruction;Gait training;Functional mobility training;Therapeutic activities;Balance training;Patient/family education PT Recommendation Follow Up Recommendations: Skilled nursing facility Equipment Recommended: Defer to next venue PT Goals  Acute Rehab PT Goals PT Goal Formulation: With patient/family Time For Goal Achievement: 2 weeks Pt will go Supine/Side to Sit: Independently;with HOB 0 degrees PT Goal: Supine/Side to Sit - Progress: Goal set today Pt will go Sit to Supine/Side: Independently;with HOB 0 degrees PT Goal: Sit to Supine/Side - Progress: Goal set today Pt will go Sit to Stand: with modified independence;with upper extremity assist PT Goal: Sit to Stand - Progress: Goal set today Pt will go Stand to Sit: with modified independence;with upper extremity assist PT Goal: Stand to Sit - Progress: Goal set today Pt will Ambulate: >150 feet;with supervision;with rolling walker PT Goal: Ambulate - Progress: Goal set today  PT Evaluation Precautions/Restrictions  Precautions Precautions: Fall Required Braces or Orthoses: No Restrictions Weight Bearing Restrictions: No Prior Functioning  Home Living Lives With: Alone Receives Help From: Family;Friend(s) Type of Home: House Home Layout: One level Home Access: Stairs to enter Entrance Stairs-Rails: None Entrance Stairs-Number of Steps: 3 Bathroom Shower/Tub: Engineer, manufacturing systems: Standard Home Adaptive Equipment: Straight cane Prior Function Level of Independence: Independent with basic ADLs;Independent with homemaking with ambulation;Independent with gait Driving: Yes Vocation: Retired Producer, television/film/video: Awake/alert Overall Cognitive Status: Appears within functional limits for tasks assessed Orientation  Level: Oriented X4;Oriented to person;Oriented to place;Oriented to time;Oriented to situation Sensation/Coordination Sensation Light Touch: Appears Intact Coordination Gross  Motor Movements are Fluid and Coordinated: Yes Extremity Assessment RUE Assessment RUE Assessment: Within Functional Limits LUE Assessment LUE Assessment: Within Functional Limits RLE Assessment RLE Assessment: Within Functional Limits LLE Assessment LLE Assessment: Within Functional Limits Mobility (including Balance) Bed Mobility Bed Mobility: Yes Supine to Sit: 4: Min assist;HOB flat;With rails Supine to Sit Details (indicate cue type and reason): Cues for easier technique.  Assist to raise trunk from bed Sitting - Scoot to Edge of Bed: 5: Supervision;With rail Sit to Supine: 4: Min assist;With rail;HOB flat Sit to Supine - Details (indicate cue type and reason): Assist to raise LE's on to bed Transfers Transfers: Yes Sit to Stand: 4: Min assist;With upper extremity assist;From bed Sit to Stand Details (indicate cue type and reason): Cues for safety.  Assist for safety/balance Stand to Sit: 5: Supervision;With upper extremity assist;To bed Ambulation/Gait Ambulation/Gait: Yes Ambulation/Gait Assistance: 4: Min assist Ambulation/Gait Assistance Details (indicate cue type and reason): Assist for safe use of RW.  Cues to stand as straight as possible and to look forward during gait. Ambulation Distance (Feet): 96 Feet Assistive device: Rolling walker Gait Pattern: Step-to pattern;Decreased stride length;Shuffle;Trunk flexed Gait velocity: Slow gait speed    Exercise    End of Session PT - End of Session Equipment Utilized During Treatment: Gait belt Activity Tolerance: Patient limited by fatigue Patient left: in bed;with call bell in reach;with family/visitor present Nurse Communication: Mobility status for ambulation General Behavior During Session: [facility] for tasks performed Cognition: [facility] for tasks  performed  Vena Austria [phone] 04/05/2011, 5:07 PM

## 2011-04-05 NOTE — Progress Notes (Signed)
Subjective:  Now on floor.  No c/o SOB, or chest pain.  Objective:  Vital Signs in the last 24 hours: BP 164/76  Pulse 66  Temp(Src) 97.2 F (36.2 C) (Oral)  Resp 19  Ht 5\' 8"  (1.727 m)  Wt 83 kg (182 lb 15.7 oz)  BMI 27.82 kg/m2  SpO2 95%  Physical Exam: Pleasant WF in NAD Lungs:  Clear to A&P Cardiac:  Regular rhythm, normal S1 and S2, no S3 Extremities:  No edema present  Intake/Output from previous day: 01/19 0701 - 01/20 0700 In: 1688.8 [I.V.:1488.8; NG/GT:100; IV Piggyback:100] Out: 800 [Urine:800]  Lab Results: Basic Metabolic Panel:  Basename 04/05/11 0515 04/04/11 0500  NA 138 136  K 3.9 4.3  CL 107 103  CO2 25 27  GLUCOSE 143* 153*  BUN 9 9  CREATININE 0.77 0.96    CBC:  Basename 04/05/11 0515 04/04/11 0500  WBC 11.3* 10.8*  NEUTROABS -- --  HGB 9.6* 9.8*  HCT 31.7* 32.2*  MCV 82.6 81.5  PLT 226 196    PROTIME: Lab Results  Component Value Date   INR 2.23* 04/05/2011   INR 2.87* 04/04/2011   INR 1.52* 04/03/2011    Telemetry: Reviewed paced rhythm..  Assessment/Plan:  1. Volume status appears stable 2. Diastolic CHF 3.Paroxysmal afib/flutter  Rec:   Continue sotalol.     Darden Palmer.  MD [facility] 04/05/2011, 12:47 PM

## 2011-04-05 NOTE — Progress Notes (Signed)
Patient refuses to be I/O Cathed.                                                            Cyndi [employer]

## 2011-04-05 NOTE — Progress Notes (Addendum)
2 Days Post-Op  Subjective: Foley inadvertently removed yesterday at 1500 with no urine output since that time. Pt states she was more uncomfortable yesterday and today. No flatus.  States they really did not get her up of bed too much yesterday.  Only about 200 cc out of NG tube/24 hrs  Objective: Vital signs in last 24 hours: Temp:  [97.6 F (36.4 C)-98.4 F (36.9 C)] 97.6 F (36.4 C) (01/20 0325) Pulse Rate:  [59-62] 60  (01/20 0600) Resp:  [14-26] 18  (01/20 0600) BP: (122-144)/(49-81) 144/81 mmHg (01/20 0323) SpO2:  [96 %-99 %] 96 % (01/20 0600) Weight:  [190 lb 4.1 oz (86.3 kg)] 190 lb 4.1 oz (86.3 kg) (01/20 0500) Last BM Date: 04/02/11 (very small amt pt was on liquid diet)  Intake/Output from previous day: 01/19 0701 - 01/20 0700 In: 1688.8 [I.V.:1488.8; NG/GT:100; IV Piggyback:100] Out: 800 [Urine:800] Intake/Output this shift:    Alert, nad cta Paced rhythm abd soft, nd, incision c/d/i with wicks. HypoBS, some TTP No edema  Lab Results:   Basename 04/05/11 0515 04/04/11 0500  WBC 11.3* 10.8*  HGB 9.6* 9.8*  HCT 31.7* 32.2*  PLT 226 196   BMET  Basename 04/05/11 0515 04/04/11 0500  NA 138 136  K 3.9 4.3  CL 107 103  CO2 25 27  GLUCOSE 143* 153*  BUN 9 9  CREATININE 0.77 0.96  CALCIUM 8.1* 8.0*   PT/INR  Basename 04/05/11 0515 04/04/11 0500  LABPROT 25.1* 30.5*  INR 2.23* 2.87*   ABG No results found for this basename: PHART:2,PCO2:2,PO2:2,HCO3:2 in the last 72 hours  Studies/Results: No results found.  Anti-infectives: Anti-infectives     Start     Dose/Rate Route Frequency Ordered Stop   04/03/11 1800   ciprofloxacin (CIPRO) IVPB 400 mg        400 mg 200 mL/hr over 60 Minutes Intravenous Every 12 hours 04/03/11 1649 04/03/11 2041   04/03/11 1800   metroNIDAZOLE (FLAGYL) IVPB 500 mg        500 mg 100 mL/hr over 60 Minutes Intravenous Every 8 hours 04/03/11 1649 04/03/11 1854   04/03/11 0815   ciprofloxacin (CIPRO) IVPB 400 mg          400 mg 200 mL/hr over 60 Minutes Intravenous To Surgery 04/03/11 0810 04/03/11 0825   04/03/11 0815   metroNIDAZOLE (FLAGYL) IVPB 500 mg        500 mg 100 mL/hr over 60 Minutes Intravenous To Surgery 04/03/11 0810 04/03/11 0842   04/01/11 1921   ertapenem (INVANZ) 1 g in sodium chloride 0.9 % 50 mL IVPB  Status:  Discontinued        1 g 100 mL/hr over 30 Minutes Intravenous 60 min pre-op 04/01/11 1921 04/03/11 0807          Assessment/Plan: s/p Procedure(s): COLON RESECTION LAPAROSCOPIC  Replace foley - foley needs to stay at least 1-2 days, pt had extensive adhesions to bladder D/c ng tube Ice chips only, cont entereg Start low dose PCA Pt/ot Start chemical VTE prophylaxis INR still above 2. Doubt pt will absorb coumadin. Will repeat INR in am. When INR falls below 2, can start heparin gtt bridge hgb stable  Foley reinserted - 175cc concentrated urine. 175cc/16hrs is about 10cc urine/hr.  Will give get 250cc fluid bolus   Chace Sella. [name], MD, FACS General, Bariatric, & Minimally Invasive Surgery [facility], [place]     LOS: 9 days    Atilano Ina 04/05/2011

## 2011-04-05 NOTE — Progress Notes (Signed)
Dr. Lindie Spruce notified of Bladder Scan 68cc. Foley out since 1500 04/04/2011.  No new orders. Very agitated 4mg  of Haldol given                                Virl Cagey

## 2011-04-06 LAB — CBC
HCT: 29.1 % — ABNORMAL LOW (ref 36.0–46.0)
MCHC: 30.2 g/dL (ref 30.0–36.0)
Platelets: 222 10*3/uL (ref 150–400)
RDW: 24.5 % — ABNORMAL HIGH (ref 11.5–15.5)

## 2011-04-06 LAB — BASIC METABOLIC PANEL
BUN: 10 mg/dL (ref 6–23)
Calcium: 7.8 mg/dL — ABNORMAL LOW (ref 8.4–10.5)
Chloride: 109 mEq/L (ref 96–112)
Creatinine, Ser: 0.7 mg/dL (ref 0.50–1.10)
GFR calc Af Amer: 88 mL/min — ABNORMAL LOW (ref >90)
GFR calc non Af Amer: 76 mL/min — ABNORMAL LOW (ref >90)

## 2011-04-06 LAB — PROTIME-INR: INR: 2.2 — ABNORMAL HIGH (ref 0.00–1.49)

## 2011-04-06 MED ORDER — BOOST / RESOURCE BREEZE PO LIQD
1.0000 | Freq: Three times a day (TID) | ORAL | Status: DC
  Administered 2011-04-06 – 2011-04-13 (×14): 1 via ORAL

## 2011-04-06 NOTE — Progress Notes (Signed)
3 Days Post-Op  Subjective: No nausea.  Passing gas.  Some dyspnea and anxiety at times.  Objective: Vital signs in last 24 hours: Temp:  [97 F (36.1 C)-97.6 F (36.4 C)] 97.6 F (36.4 C) (01/21 0520) Pulse Rate:  [59-82] 82  (01/21 0520) Resp:  [18-20] 20  (01/21 0520) BP: (146-164)/(72-85) 149/79 mmHg (01/21 0520) SpO2:  [95 %-97 %] 96 % (01/21 0520) Weight:  [182 lb 15.7 oz (83 kg)-183 lb 3.2 oz (83.1 kg)] 183 lb 3.2 oz (83.1 kg) (01/21 0520) Last BM Date: 04/02/11 (very small amt pt was on liquid diet)  Intake/Output from previous day: 01/20 0701 - 01/21 0700 In: 1725 [I.V.:1725] Out: 1150 [Urine:1150] Intake/Output this shift:    PE: Abd-soft, incisions clean, wicks in midline wound  Lab Results:   Basename 04/06/11 0500 04/05/11 0515  WBC 8.4 11.3*  HGB 8.8* 9.6*  HCT 29.1* 31.7*  PLT 222 226   BMET  Basename 04/06/11 0500 04/05/11 0515  NA 141 138  K 4.0 3.9  CL 109 107  CO2 26 25  GLUCOSE 102* 143*  BUN 10 9  CREATININE 0.70 0.77  CALCIUM 7.8* 8.1*   PT/INR  Basename 04/06/11 0500 04/05/11 0515  LABPROT 24.8* 25.1*  INR 2.20* 2.23*   Comprehensive Metabolic Panel:    Component Value Date/Time   NA 141 04/06/2011 0500   K 4.0 04/06/2011 0500   CL 109 04/06/2011 0500   CO2 26 04/06/2011 0500   BUN 10 04/06/2011 0500   CREATININE 0.70 04/06/2011 0500   GLUCOSE 102* 04/06/2011 0500   CALCIUM 7.8* 04/06/2011 0500   AST 31 04/01/2011 0754   ALT 19 04/01/2011 0754   ALKPHOS 50 04/01/2011 0754   BILITOT 0.2* 04/01/2011 0754   PROT 6.6 04/01/2011 0754   ALBUMIN 3.1* 04/01/2011 0754     Studies/Results: No results found.  Anti-infectives: Anti-infectives     Start     Dose/Rate Route Frequency Ordered Stop   04/03/11 1800   ciprofloxacin (CIPRO) IVPB 400 mg        400 mg 200 mL/hr over 60 Minutes Intravenous Every 12 hours 04/03/11 1649 04/03/11 2041   04/03/11 1800   metroNIDAZOLE (FLAGYL) IVPB 500 mg        500 mg 100 mL/hr over 60 Minutes  Intravenous Every 8 hours 04/03/11 1649 04/03/11 1854   04/03/11 0815   ciprofloxacin (CIPRO) IVPB 400 mg        400 mg 200 mL/hr over 60 Minutes Intravenous To Surgery 04/03/11 0810 04/03/11 0825   04/03/11 0815   metroNIDAZOLE (FLAGYL) IVPB 500 mg        500 mg 100 mL/hr over 60 Minutes Intravenous To Surgery 04/03/11 0810 04/03/11 0842   04/01/11 1921   ertapenem (INVANZ) 1 g in sodium chloride 0.9 % 50 mL IVPB  Status:  Discontinued        1 g 100 mL/hr over 30 Minutes Intravenous 60 min pre-op 04/01/11 1921 04/03/11 1610          Assessment Principal Problem:  *Atrial flutter with rapid ventricular response Active Problems:  Hypertension  Diastolic CHF, acute on chronic  Anemia-abl; hgb 9/6 to 8/8  Colonic mass s/p sigmoid colectomy 04/03/11-bowel function starting to return; awaiting final pathology report    LOS: 10 days   Plan: Decrease IVF.  Clear liquid diet.  Monitor hgb.   Ludean Duhart J 04/06/2011

## 2011-04-06 NOTE — Progress Notes (Signed)
Physical Therapy Treatment Patient Details Name: [name] MRN: [id number] DOB: [birth year] Today's Date: 04/06/2011  PT Assessment/Plan  PT - Assessment/Plan Comments on Treatment Session: pt rather eager to participate in PT;  Gait guarded and stiff but relatively effortless with RW PT Plan: Discharge plan remains appropriate Follow Up Recommendations: Skilled nursing facility Equipment Recommended: Defer to next venue PT Goals  Acute Rehab PT Goals PT Goal: Supine/Side to Sit - Progress: Progressing toward goal PT Goal: Sit to Stand - Progress: Progressing toward goal PT Goal: Stand to Sit - Progress: Progressing toward goal PT Goal: Ambulate - Progress: Progressing toward goal  PT Treatment Precautions/Restrictions  Precautions Precautions: Fall Required Braces or Orthoses: No Restrictions Weight Bearing Restrictions: No Mobility (including Balance) Bed Mobility Supine to Sit: 4: Min assist;With rails;HOB flat Supine to Sit Details (indicate cue type and reason): vc's for hand placement Sitting - Scoot to Edge of Bed: 6: Modified independent (Device/Increase time) Transfers Transfers: Yes Sit to Stand: Other (comment);From chair/3-in-1 (min guard A) Sit to Stand Details (indicate cue type and reason): vc's for hand placement for optimum safety Ambulation/Gait Ambulation/Gait Assistance: Other (comment) (min guard A) Ambulation/Gait Assistance Details (indicate cue type and reason): vc's for postural checks, better use of RW Ambulation Distance (Feet): 140 Feet Assistive device: Rolling walker Gait Pattern: Step-through pattern;Decreased step length - right;Decreased step length - left;Decreased stride length;Trunk flexed Stairs: No  Posture/Postural Control Posture/Postural Control: No significant limitations Balance Balance Assessed: Yes (generally steady up with RW) Exercise    End of Session PT - End of Session Activity Tolerance: Patient tolerated treatment  well Patient left: in chair;with call bell in reach Nurse Communication: Mobility status for ambulation General Behavior During Session: [facility] for tasks performed Cognition: [facility] for tasks performed  Uchenna Seufert, Eliseo Gum 04/06/2011, 3:55 PM  04/06/2011  [place] Bing, PT [phone] (pager)

## 2011-04-06 NOTE — Progress Notes (Signed)
Occupational Therapy Evaluation Patient Details Name: [name] MRN: [id number] DOB: 1922-12-17 Today's Date: 04/06/2011 10:09-10:55  Problem List:  Patient Active Problem List  Diagnoses  . Pacemaker  . Edema  . Hypertension  . Atrial fibrillation  . IBS (irritable bowel syndrome)  . Osteoporosis  . Atrial flutter with rapid ventricular response  . Ovarian cancer  . Diastolic CHF, acute on chronic  . Anemia  . Colonic mass s/p sigmoid colectomy 04/03/11    Past Medical History:  Past Medical History  Diagnosis Date  . Irregular heart rhythm   . Diastolic heart failure   . Shortness of breath   . Ovarian cancer   . Ovarian cancer   . Hypertension   . Atrial fibrillation   . Tachy-brady syndrome   . CHF (congestive heart failure)     diastolic heart failure   Past Surgical History:  Past Surgical History  Procedure Date  . Cardiac pacemaker placement   . Abdominal hysterectomy   . Oophorectomy   . Colonoscopy 03/30/2011    Procedure: COLONOSCOPY;  Surgeon: Barrie Folk, MD;  Location: [facility] ENDOSCOPY;  Service: Endoscopy;  Laterality: N/A;  . Esophagogastroduodenoscopy 03/30/2011    Procedure: ESOPHAGOGASTRODUODENOSCOPY (EGD);  Surgeon: Barrie Folk, MD;  Location: [facility] ENDOSCOPY;  Service: Endoscopy;  Laterality: N/A;    OT Assessment/Plan/Recommendation OT Assessment Clinical Impression Statement: This 78 yo admitted with aflutter with RVR and found to have stage I colon cancer and now s/p resection presents to acute OT with problems below thus affecting pt's PLOF at I with BADLs/IADLs. WIll benefit from continued OT at SNF, acute OT signing off. OT Recommendation/Assessment: All further OT needs can be met in the next venue of care OT Problem List: Decreased strength;Impaired balance (sitting and/or standing);Pain OT Therapy Diagnosis : Generalized weakness OT Recommendation Follow Up Recommendations: Skilled nursing facility Equipment Recommended: Defer to next  venue Individuals Consulted Consulted and Agree with Results and Recommendations: Patient OT Goals    OT Evaluation Precautions/Restrictions  Precautions Precautions: Fall Required Braces or Orthoses: No Restrictions Weight Bearing Restrictions: No Prior Functioning Home Living Lives With: Alone Receives Help From: Family;Friend(s) Type of Home: House Home Layout: One level Home Access: Stairs to enter Entrance Stairs-Rails: None Entrance Stairs-Number of Steps: 3 Bathroom Shower/Tub: Forensic scientist: Standard Bathroom Accessibility: Yes How Accessible: Accessible via walker Home Adaptive Equipment: Straight cane Prior Function Level of Independence: Independent with basic ADLs;Independent with homemaking with ambulation;Independent with gait;Independent with transfers Driving: Yes Vocation: Retired ADL ADL Eating/Feeding: Simulated;Set up Where Assessed - Eating/Feeding: Edge of bed;Chair Grooming: Performed;Wash/dry face;Teeth care;Brushing hair;Supervision/safety;Set up Where Assessed - Grooming: Standing at sink;Unsupported Upper Body Bathing: Simulated;Supervision/safety;Set up Where Assessed - Upper Body Bathing: Sitting, chair;Sitting, bed;Unsupported Lower Body Bathing: Simulated;Minimal assistance Where Assessed - Lower Body Bathing: Unsupported;Sit to stand from chair;Sit to stand from bed Upper Body Dressing: Simulated;Set up;Supervision/safety Where Assessed - Upper Body Dressing: Unsupported;Sitting, bed;Sitting, chair Lower Body Dressing: Simulated;Minimal assistance Where Assessed - Lower Body Dressing: Supported;Sitting, chair;Sitting, bed Toilet Transfer: Simulated;Minimal assistance Toilet Transfer Details (indicate cue type and reason): Bed out into hallway and back into room to groom and then sit in recliner Toilet Transfer Method: Ambulating Toileting - Clothing Manipulation: Supervision/safety;Simulated Where Assessed -  Toileting Clothing Manipulation: Standing Toileting - Hygiene: Simulated;Independent Where Assessed - Toileting Hygiene: Sit on 3-in-1 or toilet Tub/Shower Transfer: Not assessed Tub/Shower Transfer Method: Not assessed Equipment Used:  (HHA and pushing IV pole) Ambulation Related to ADLs: min A Vision/Perception  Cognition Cognition Arousal/Alertness: Awake/alert Overall Cognitive Status: Appears within functional limits for tasks assessed Orientation Level: Oriented X4 Sensation/Coordination   Extremity Assessment RUE Assessment RUE Assessment: Within Functional Limits LUE Assessment LUE Assessment: Within Functional Limits Mobility  Bed Mobility Bed Mobility: Yes Supine to Sit: 6: Modified independent (Device/Increase time);With rails;HOB elevated (Comment degrees) (20 degrees, increased time) Sitting - Scoot to Edge of Bed: 6: Modified independent (Device/Increase time);With rail (increased time) Transfers Transfers: Yes Sit to Stand: 4: Min assist;With upper extremity assist;From bed Stand to Sit: 5: Supervision;With upper extremity assist;With armrests;To chair/3-in-1 Exercises   End of Session OT - End of Session Equipment Utilized During Treatment: Other (comment) (none) Activity Tolerance: Patient tolerated treatment well Patient left: in chair;with call bell in reach;with family/visitor present (phone within reach and oldest son present) General Behavior During Session: [facility] for tasks performed Cognition: [facility] for tasks performed   Evette Georges [phone] 04/06/2011, 11:11 AM

## 2011-04-06 NOTE — Plan of Care (Signed)
Problem: Phase II Progression Outcomes Goal: Progress activity as tolerated unless otherwise ordered Outcome: Completed/Met Date Met:  04/06/11 Up with OT in room and hallway. Ignacia Palma, [place] [phone] 04/06/2011

## 2011-04-06 NOTE — Plan of Care (Signed)
Problem: Phase I Progression Outcomes Goal: OOB as tolerated unless otherwise ordered Outcome: Completed/Met Date Met:  04/06/11 OOB with OT today.  Cathy January Bergthold, OTR/L [phone] 04/06/2011        

## 2011-04-06 NOTE — Progress Notes (Signed)
04/06/11 UR Completed. Tera Mater, RN, BSN

## 2011-04-06 NOTE — Progress Notes (Signed)
INITIAL ADULT NUTRITION ASSESSMENT Date: 04/06/2011   Time: 2:52 PM Reason for Assessment: NPO/CL  ASSESSMENT: Female 78 y.o.  Dx: Atrial flutter with rapid ventricular response  Hx:  Past Medical History  Diagnosis Date  . Irregular heart rhythm   . Diastolic heart failure   . Shortness of breath   . Ovarian cancer   . Ovarian cancer   . Hypertension   . Atrial fibrillation   . Tachy-brady syndrome   . CHF (congestive heart failure)     diastolic heart failure    Related Meds:     . alvimopan  12 mg Oral BID  . sotalol  160 mg Oral BID     Ht: 5\' 8"  (172.7 cm)  Wt: 183 lb 3.2 oz (83.1 kg) (bed scale)  Ideal Wt: 63.9 kg  % Ideal Wt: 130%  Usual Wt: unable to obtain % Usual Wt: --  Body mass index is 27.86 kg/(m^2). Patient is overweight  Food/Nutrition Related Hx: No other nutrition problems indicated on nutrition risk screen.   Labs:  CMP     Component Value Date/Time   NA 141 04/06/2011 0500   K 4.0 04/06/2011 0500   CL 109 04/06/2011 0500   CO2 26 04/06/2011 0500   GLUCOSE 102* 04/06/2011 0500   BUN 10 04/06/2011 0500   CREATININE 0.70 04/06/2011 0500   CALCIUM 7.8* 04/06/2011 0500   PROT 6.6 04/01/2011 0754   ALBUMIN 3.1* 04/01/2011 0754   AST 31 04/01/2011 0754   ALT 19 04/01/2011 0754   ALKPHOS 50 04/01/2011 0754   BILITOT 0.2* 04/01/2011 0754   GFRNONAA 76* 04/06/2011 0500   GFRAA 88* 04/06/2011 0500    I/O last 3 completed shifts: In: 2725 [I.V.:2625; NG/GT:100] Out: 1150 [Urine:1150]   Diet Order: Clear Liquid  Supplements/Tube Feeding: none  IVF:    0.9 % NaCl with KCl 20 mEq / L Last Rate: 75 mL/hr at 04/06/11 0246    Estimated Nutritional Needs:   Kcal:  1500-1700 kcal Protein: 90-100 gm Fluid: 1.5 - 1.7 L  Patient has been on clear liquids or NPO for 8 days with one day of full liquids in the middle. Patient is s/p sigmoid colectomy, bowel sounds are starting to return per surgons note. Patient weight has been variable, but up from  admission weight.   NUTRITION DIAGNOSIS: -Inadequate oral intake (NI-2.1).  Status: Ongoing  RELATED TO: diet unable to provide adequate calories  AS EVIDENCE BY: NPO and clear liquid diet x 7 days  MONITORING/EVALUATION(Goals): Goal: PO intake of meals and supplements will meet >/= 90% of estimated nutrition needs Monitor: PO intake, diet advance, weight, labs, i/o's  EDUCATION NEEDS: -No education needs identified at this time  INTERVENTION: 1. Add Resource Breeze TID with meals until diet advances 2. Once diet advances, RD will reassess supplement needs  Dietitian [phone]  DOCUMENTATION CODES Per approved criteria  -Not Applicable    Clarene Duke MARIE 04/06/2011, 2:52 PM

## 2011-04-06 NOTE — Progress Notes (Signed)
SUBJECTIVE:  Tolerating liquids.  OBJECTIVE:   Vitals:   Filed Vitals:   04/06/11 1055 04/06/11 1058 04/06/11 1338 04/06/11 1530  BP:  166/78 154/80   Pulse: 68 69 68 61  Temp:   98.8 F (37.1 C)   TempSrc:   Oral   Resp:   16   Height:      Weight:      SpO2: 94%  95% 95%   I&O's:   Intake/Output Summary (Last 24 hours) at 04/06/11 2128 Last data filed at 04/06/11 1900  Gross per 24 hour  Intake 2292.92 ml  Output    800 ml  Net 1492.92 ml   TELEMETRY: Reviewed telemetry pt in paced rhythm, AFib.     PHYSICAL EXAM General: Well developed, well nourished, Head:    Normal cephalic and atramatic  Lungs:   Clear bilaterally to auscultation and percussion. Heart:   HRRR S1 S2  Extremities:  No  edema.   Neuro: Alert and oriented X 3. Psych:  Good affect, responds appropriately   LABS: Basic Metabolic Panel:  Basename 04/06/11 0500 04/05/11 0515 04/04/11 0500  NA 141 138 --  K 4.0 3.9 --  CL 109 107 --  CO2 26 25 --  GLUCOSE 102* 143* --  BUN 10 9 --  CREATININE 0.70 0.77 --  CALCIUM 7.8* 8.1* --  MG -- -- 1.9  PHOS -- -- 3.1   Liver Function Tests: No results found for this basename: AST:2,ALT:2,ALKPHOS:2,BILITOT:2,PROT:2,ALBUMIN:2 in the last 72 hours No results found for this basename: LIPASE:2,AMYLASE:2 in the last 72 hours CBC:  Basename 04/06/11 0500 04/05/11 0515  WBC 8.4 11.3*  NEUTROABS -- --  HGB 8.8* 9.6*  HCT 29.1* 31.7*  MCV 83.6 82.6  PLT 222 226   Cardiac Enzymes: No results found for this basename: CKTOTAL:3,CKMB:3,CKMBINDEX:3,TROPONINI:3 in the last 72 hours BNP: No components found with this basename: POCBNP:3 D-Dimer: No results found for this basename: DDIMER:2 in the last 72 hours Hemoglobin A1C: No results found for this basename: HGBA1C in the last 72 hours Fasting Lipid Panel: No results found for this basename: CHOL,HDL,LDLCALC,TRIG,CHOLHDL,LDLDIRECT in the last 72 hours Thyroid Function Tests: No results found for  this basename: TSH,T4TOTAL,FREET3,T3FREE,THYROIDAB in the last 72 hours Anemia Panel: No results found for this basename: VITAMINB12,FOLATE,FERRITIN,TIBC,IRON,RETICCTPCT in the last 72 hours Coag Panel:   Lab Results  Component Value Date   INR 2.20* 04/06/2011   INR 2.23* 04/05/2011   INR 2.87* 04/04/2011    ASSESSMENT: AFib  PLAN:  Heparin /Coumadin for stroke prevention when safe from bleeding standpoint.  Advancing diet.  Corky Crafts., MD  04/06/2011  9:28 PM

## 2011-04-07 ENCOUNTER — Encounter (HOSPITAL_COMMUNITY): Payer: Self-pay | Admitting: General Surgery

## 2011-04-07 LAB — PROTIME-INR
INR: 2.04 — ABNORMAL HIGH (ref 0.00–1.49)
Prothrombin Time: 23.4 seconds — ABNORMAL HIGH (ref 11.6–15.2)

## 2011-04-07 LAB — CBC
Hemoglobin: 9.2 g/dL — ABNORMAL LOW (ref 12.0–15.0)
MCH: 24.3 pg — ABNORMAL LOW (ref 26.0–34.0)
MCHC: 29.4 g/dL — ABNORMAL LOW (ref 30.0–36.0)

## 2011-04-07 LAB — GLUCOSE, CAPILLARY: Glucose-Capillary: 225 mg/dL — ABNORMAL HIGH (ref 70–99)

## 2011-04-07 MED ORDER — WARFARIN SODIUM 2 MG PO TABS
2.0000 mg | ORAL_TABLET | Freq: Once | ORAL | Status: AC
  Administered 2011-04-08: 2 mg via ORAL
  Filled 2011-04-07 (×2): qty 1

## 2011-04-07 NOTE — Progress Notes (Signed)
Clinical Social Worker completed psychosocial assessment, please see shadow chart for details.  CSW submitted appropriate paperwork to [facility] area SNFs for consideration.  CSW phoned Tammy at [facility] who stated she MAY have a bed available for pt pending availability.    MD, please sign FL2 located at front of shadow chart. Thank you,   Angelia Mould, MSW, [employer] [phone]

## 2011-04-07 NOTE — Progress Notes (Signed)
SUBJECTIVE:  Tolerating liquids.  Surgery ok with coumadin.  Anemia better.  OBJECTIVE:   Vitals:   Filed Vitals:   04/07/11 0430 04/07/11 0520 04/07/11 1300 04/07/11 1755  BP: 181/101 159/87 162/89 159/84  Pulse: 98 98 95 59  Temp: 98.3 F (36.8 C)  98.8 F (37.1 C) 98.1 F (36.7 C)  TempSrc: Oral  Oral Oral  Resp: 16  16 18   Height:      Weight:      SpO2: 98%  98% 99%   I&O's:    Intake/Output Summary (Last 24 hours) at 04/07/11 1820 Last data filed at 04/07/11 1400  Gross per 24 hour  Intake 1017.92 ml  Output   1375 ml  Net -357.08 ml   TELEMETRY: Reviewed telemetry pt in paced rhythm, AFib.     PHYSICAL EXAM General: Well developed, well nourished, Head:    Normal cephalic and atramatic  Lungs:   Clear bilaterally to auscultation and percussion. Heart:   HRRR S1 S2  Extremities:  No  edema.   Neuro: Alert and oriented X 3. Psych:  Good affect, responds appropriately   LABS: Basic Metabolic Panel:  Basename 04/06/11 0500 04/05/11 0515  NA 141 138  K 4.0 3.9  CL 109 107  CO2 26 25  GLUCOSE 102* 143*  BUN 10 9  CREATININE 0.70 0.77  CALCIUM 7.8* 8.1*  MG -- --  PHOS -- --   Liver Function Tests: No results found for this basename: AST:2,ALT:2,ALKPHOS:2,BILITOT:2,PROT:2,ALBUMIN:2 in the last 72 hours No results found for this basename: LIPASE:2,AMYLASE:2 in the last 72 hours CBC:  Basename 04/07/11 0626 04/06/11 0500  WBC 7.6 8.4  NEUTROABS -- --  HGB 9.2* 8.8*  HCT 31.3* 29.1*  MCV 82.6 83.6  PLT 262 222   Cardiac Enzymes: No results found for this basename: CKTOTAL:3,CKMB:3,CKMBINDEX:3,TROPONINI:3 in the last 72 hours BNP: No components found with this basename: POCBNP:3 D-Dimer: No results found for this basename: DDIMER:2 in the last 72 hours Hemoglobin A1C: No results found for this basename: HGBA1C in the last 72 hours Fasting Lipid Panel: No results found for this basename: CHOL,HDL,LDLCALC,TRIG,CHOLHDL,LDLDIRECT in the last 72  hours Thyroid Function Tests: No results found for this basename: TSH,T4TOTAL,FREET3,T3FREE,THYROIDAB in the last 72 hours Anemia Panel: No results found for this basename: VITAMINB12,FOLATE,FERRITIN,TIBC,IRON,RETICCTPCT in the last 72 hours Coag Panel:   Lab Results  Component Value Date   INR 2.04* 04/07/2011   INR 2.20* 04/06/2011   INR 2.23* 04/05/2011    ASSESSMENT: AFib  PLAN:  Restart Coumadin.  INR therapeutic.  May not need much coumadin.  Advancing diet.  Corky Crafts., MD  04/07/2011  6:20 PM

## 2011-04-07 NOTE — Progress Notes (Signed)
ANTICOAGULATION CONSULT NOTE - Initial Consult  Pharmacy Consult for Coumadin Indication: atrial fibrillation  Allergies  Allergen Reactions  . Lasix (Furosemide) Swelling  . Penicillins Swelling    Mouth swelling and redness  . Sulfa Antibiotics Rash    Patient Measurements: Height: 5\' 8"  (172.7 cm) Weight: 183 lb 3.2 oz (83.1 kg) (bed scale) IBW/kg (Calculated) : 63.9  Heparin Dosing Weight:   Vital Signs: Temp: 98.1 F (36.7 C) (01/22 1755) Temp src: Oral (01/22 1755) BP: 159/84 mmHg (01/22 1755) Pulse Rate: 59  (01/22 1755)  Labs:  Basename 04/07/11 1140 04/07/11 0626 04/06/11 0500 04/05/11 0515  HGB -- 9.2* 8.8* --  HCT -- 31.3* 29.1* 31.7*  PLT -- 262 222 226  APTT -- -- 59* 50*  LABPROT 23.4* -- 24.8* 25.1*  INR 2.04* -- 2.20* 2.23*  HEPARINUNFRC -- -- -- --  CREATININE -- -- 0.70 0.77  CKTOTAL -- -- -- --  CKMB -- -- -- --  TROPONINI -- -- -- --   Estimated Creatinine Clearance: 57.1 ml/min (by C-G formula based on Cr of 0.7).  Medical History: Past Medical History  Diagnosis Date  . Irregular heart rhythm   . Diastolic heart failure   . Shortness of breath   . Ovarian cancer   . Ovarian cancer   . Hypertension   . Atrial fibrillation   . Tachy-brady syndrome   . CHF (congestive heart failure)     diastolic heart failure    Medications:  Scheduled:    . alvimopan  12 mg Oral BID  . feeding supplement  1 Container Oral TID WC  . sotalol  160 mg Oral BID  . warfarin  2 mg Oral Once    Assessment: Pt admitted for sigmoid colon cancer surgery. Was bridged with heparin after stopping coumadin. Following surgery, INR went to 2.87 with no coumadin. Now at 2.0. Home dose of coumadin was 5 mg daily with 2.5 mg once a week. Will start with lower dose of 2 mg. Goal of Therapy:  INR 2-3   Plan:  Will start with low dose of 2 mg coumadin today due to elevated INR with no coumadin. Home dose was 5 mg daily except for 2.5 mg on Thursdays. Will follow  daily INR.  Eugene Garnet 04/07/2011,8:27 PM

## 2011-04-07 NOTE — Progress Notes (Signed)
4 Days Post-Op  Subjective: Tolerating liquid diet.  Passing gas.  No BM yet.  Requires assistance getting out of bed.  Objective: Vital signs in last 24 hours: Temp:  [97.9 F (36.6 C)-98.8 F (37.1 C)] 98.3 F (36.8 C) (01/22 0430) Pulse Rate:  [61-98] 98  (01/22 0520) Resp:  [16-18] 16  (01/22 0430) BP: (152-181)/(78-101) 159/87 mmHg (01/22 0520) SpO2:  [94 %-100 %] 98 % (01/22 0430) Last BM Date: 04/03/11  Intake/Output from previous day: 01/21 0701 - 01/22 0700 In: 1392.9 [P.O.:460; I.V.:932.9] Out: 575 [Urine:575] Intake/Output this shift:    PE: Abd-soft, incision clean with wicks in (partially removed), active BS  Lab Results:   Basename 04/07/11 0626 04/06/11 0500  WBC 7.6 8.4  HGB 9.2* 8.8*  HCT 31.3* 29.1*  PLT 262 222   BMET  Basename 04/06/11 0500 04/05/11 0515  NA 141 138  K 4.0 3.9  CL 109 107  CO2 26 25  GLUCOSE 102* 143*  BUN 10 9  CREATININE 0.70 0.77  CALCIUM 7.8* 8.1*   PT/INR  Basename 04/06/11 0500 04/05/11 0515  LABPROT 24.8* 25.1*  INR 2.20* 2.23*   Comprehensive Metabolic Panel:    Component Value Date/Time   NA 141 04/06/2011 0500   K 4.0 04/06/2011 0500   CL 109 04/06/2011 0500   CO2 26 04/06/2011 0500   BUN 10 04/06/2011 0500   CREATININE 0.70 04/06/2011 0500   GLUCOSE 102* 04/06/2011 0500   CALCIUM 7.8* 04/06/2011 0500   AST 31 04/01/2011 0754   ALT 19 04/01/2011 0754   ALKPHOS 50 04/01/2011 0754   BILITOT 0.2* 04/01/2011 0754   PROT 6.6 04/01/2011 0754   ALBUMIN 3.1* 04/01/2011 0754     Studies/Results: No results found.  Anti-infectives: Anti-infectives     Start     Dose/Rate Route Frequency Ordered Stop   04/03/11 1800   ciprofloxacin (CIPRO) IVPB 400 mg        400 mg 200 mL/hr over 60 Minutes Intravenous Every 12 hours 04/03/11 1649 04/03/11 2041   04/03/11 1800   metroNIDAZOLE (FLAGYL) IVPB 500 mg        500 mg 100 mL/hr over 60 Minutes Intravenous Every 8 hours 04/03/11 1649 04/03/11 1854   04/03/11 0815    ciprofloxacin (CIPRO) IVPB 400 mg        400 mg 200 mL/hr over 60 Minutes Intravenous To Surgery 04/03/11 0810 04/03/11 0825   04/03/11 0815   metroNIDAZOLE (FLAGYL) IVPB 500 mg        500 mg 100 mL/hr over 60 Minutes Intravenous To Surgery 04/03/11 0810 04/03/11 0842   04/01/11 1921   ertapenem (INVANZ) 1 g in sodium chloride 0.9 % 50 mL IVPB  Status:  Discontinued        1 g 100 mL/hr over 30 Minutes Intravenous 60 min pre-op 04/01/11 1921 04/03/11 6045          Assessment Principal Problem:  *Atrial flutter with rapid ventricular response Active Problems:  Hypertension  Diastolic CHF, acute on chronic  Anemia-hgb up a little.  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  Deconditioned state    LOS: 11 days   Plan: Full liquid diet.  Discharge planning.  May restart Coumadin.   Deepak Bless J 04/07/2011

## 2011-04-08 ENCOUNTER — Other Ambulatory Visit: Payer: Self-pay

## 2011-04-08 DIAGNOSIS — C187 Malignant neoplasm of sigmoid colon: Secondary | ICD-10-CM | POA: Diagnosis present

## 2011-04-08 LAB — HEPARIN LEVEL (UNFRACTIONATED): Heparin Unfractionated: <0.1 IU/mL — ABNORMAL LOW (ref 0.30–0.70)

## 2011-04-08 LAB — PROTIME-INR: Prothrombin Time: 19.1 seconds — ABNORMAL HIGH (ref 11.6–15.2)

## 2011-04-08 MED ORDER — DILTIAZEM LOAD VIA INFUSION
10.0000 mg | Freq: Once | INTRAVENOUS | Status: AC
  Administered 2011-04-08: 10 mg via INTRAVENOUS
  Filled 2011-04-08: qty 10

## 2011-04-08 MED ORDER — HEPARIN SOD (PORCINE) IN D5W 100 UNIT/ML IV SOLN
1300.0000 [IU]/h | INTRAVENOUS | Status: DC
Start: 2011-04-08 — End: 2011-04-10
  Administered 2011-04-08: 900 [IU]/h via INTRAVENOUS
  Administered 2011-04-09 – 2011-04-10 (×2): 1300 [IU]/h via INTRAVENOUS
  Filled 2011-04-08 (×3): qty 250

## 2011-04-08 MED ORDER — WARFARIN SODIUM 5 MG PO TABS
5.0000 mg | ORAL_TABLET | Freq: Once | ORAL | Status: AC
  Administered 2011-04-08: 5 mg via ORAL
  Filled 2011-04-08: qty 1

## 2011-04-08 MED ORDER — DILTIAZEM HCL 100 MG IV SOLR
10.0000 mg/h | INTRAVENOUS | Status: DC
  Administered 2011-04-08 – 2011-04-09 (×3): 5 mg/h via INTRAVENOUS
  Administered 2011-04-10 – 2011-04-11 (×3): 10 mg/h via INTRAVENOUS
  Filled 2011-04-08 (×6): qty 100

## 2011-04-08 MED ORDER — LISINOPRIL 10 MG PO TABS
10.0000 mg | ORAL_TABLET | Freq: Every day | ORAL | Status: DC
  Administered 2011-04-08 – 2011-04-13 (×6): 10 mg via ORAL
  Filled 2011-04-08 (×7): qty 1

## 2011-04-08 MED ORDER — DILTIAZEM HCL 60 MG PO TABS
60.0000 mg | ORAL_TABLET | Freq: Three times a day (TID) | ORAL | Status: DC
  Administered 2011-04-09: 60 mg via ORAL
  Filled 2011-04-08 (×6): qty 1

## 2011-04-08 NOTE — Progress Notes (Signed)
PT Cancellation Note  Treatment cancelled today due to medical issues with patient which prohibited therapy.  Pt with elevated HR and BP, will hold PT until pt more stable.  Check back tomorrow.  Communicated with nsg.  Pheng Prokop, [place]  [phone] 04/08/2011, 3:31 PM

## 2011-04-08 NOTE — Progress Notes (Signed)
Pts HR was 135 sustained this AM at 1100. BP was 170/110.  Dr. Eldridge Dace was notified and a cardizem infusion was ordered to be started.  Pts HR now is 62, BP is 140/70.  PO cardizem has been ordered.  Will continue to monitor pt.

## 2011-04-08 NOTE — Progress Notes (Signed)
ANTICOAGULATION CONSULT NOTE - Follow Up Consult  Pharmacy Consult for Heparin / Coumadin Indication: atrial fibrillation  Assessment: 78 year old on Coumadin PTA for AFib, now sigmoid colectomy Pod 5, INR down to 1.57, will restart heparin for INR < 2.  Heparin level now < 0.1 and no noted bleeding complications with anticoagulation.   Will increase some to achieve desired goal range.    Goal of Therapy:  INR = 2 to 3 Heparin level = 0.3 to 0.7   Plan:  1) Increase Heparin drip to 1000 units / hr 2) Check Heparin level in the morning since this is just bridge coverage.    Nadara Mustard, PharmD., MS Clinical Pharmacist Pager:  [phone] 04/08/2011,8:12 PM        Allergies  Allergen Reactions  . Lasix (Furosemide) Swelling  . Penicillins Swelling    Mouth swelling and redness  . Sulfa Antibiotics Rash    Patient Measurements: Height: 5\' 8"  (172.7 cm) Weight: 185 lb 3 oz (84 kg) (bed scale) IBW/kg (Calculated) : 63.9   Vital Signs: Temp: 98.3 F (36.8 C) (01/23 1438) Temp src: Oral (01/23 1438) BP: 140/70 mmHg (01/23 1812) Pulse Rate: 62  (01/23 1812)  Labs:  Alvira Philips 04/08/11 1917 04/08/11 0558 04/07/11 1140 04/07/11 0626 04/06/11 0500  HGB -- -- -- 9.2* 8.8*  HCT -- -- -- 31.3* 29.1*  PLT -- -- -- 262 222  APTT -- -- -- -- 59*  LABPROT -- 19.1* 23.4* -- 24.8*  INR -- 1.57* 2.04* -- 2.20*  HEPARINUNFRC <0.10* -- -- -- --  CREATININE -- -- -- -- 0.70  CKTOTAL -- -- -- -- --  CKMB -- -- -- -- --  TROPONINI -- -- -- -- --   Estimated Creatinine Clearance: 57.3 ml/min (by C-G formula based on Cr of 0.7).   Medications:  Scheduled:     . alvimopan  12 mg Oral BID  . diltiazem  10 mg Intravenous Once  . diltiazem  60 mg Oral Q8H  . feeding supplement  1 Container Oral TID WC  . lisinopril  10 mg Oral Daily  . sotalol  160 mg Oral BID  . warfarin  2 mg Oral Once  . warfarin  5 mg Oral ONCE-1800

## 2011-04-08 NOTE — Progress Notes (Signed)
ANTICOAGULATION CONSULT NOTE - Follow Up Consult  Pharmacy Consult for Heparin / Coumadin Indication: atrial fibrillation  Allergies  Allergen Reactions  . Lasix (Furosemide) Swelling  . Penicillins Swelling    Mouth swelling and redness  . Sulfa Antibiotics Rash    Patient Measurements: Height: 5\' 8"  (172.7 cm) Weight: 185 lb 3 oz (84 kg) (bed scale) IBW/kg (Calculated) : 63.9   Vital Signs: Temp: 98.2 F (36.8 C) (01/23 0520) BP: 164/112 mmHg (01/23 0520) Pulse Rate: 134  (01/23 0520)  Labs:  Basename 04/08/11 0558 04/07/11 1140 04/07/11 0626 04/06/11 0500  HGB -- -- 9.2* 8.8*  HCT -- -- 31.3* 29.1*  PLT -- -- 262 222  APTT -- -- -- 59*  LABPROT 19.1* 23.4* -- 24.8*  INR 1.57* 2.04* -- 2.20*  HEPARINUNFRC -- -- -- --  CREATININE -- -- -- 0.70  CKTOTAL -- -- -- --  CKMB -- -- -- --  TROPONINI -- -- -- --   Estimated Creatinine Clearance: 57.3 ml/min (by C-G formula based on Cr of 0.7).   Medications:  Scheduled:    . alvimopan  12 mg Oral BID  . feeding supplement  1 Container Oral TID WC  . sotalol  160 mg Oral BID  . warfarin  2 mg Oral Once    Assessment: 78 year old on Coumadin PTA for AFib, now sigmoid colectomy Pod 5, INR down to 1.57, will restart heparin for INR < 2  Goal of Therapy:  INR = 2 to 3 Heparin level = 0.3 to 0.7   Plan:  1) Heparin drip at 900 units / hr 2) Heparin level 8 hours after heparin starts 3) Coumadin 5 mg po x 1 dose tonight 4) Daily heparin level, CBC, INR  Elwin Sleight 04/08/2011,9:14 AM

## 2011-04-08 NOTE — Progress Notes (Signed)
SUBJECTIVE:  Tolerating liquids.  Surgery ok with coumadin.  Anemia better yesterday.  HR increased.  BP increased.  OBJECTIVE:   Vitals:   Filed Vitals:   04/07/11 1755 04/07/11 2130 04/08/11 0255 04/08/11 0520  BP: 159/84 162/72 165/71 164/112  Pulse: 59 59 60 134  Temp: 98.1 F (36.7 C) 97.4 F (36.3 C) 97.6 F (36.4 C) 98.2 F (36.8 C)  TempSrc: Oral     Resp: 18 18 18 20   Height:      Weight:    84 kg (185 lb 3 oz)  SpO2: 99% 100% 100% 97%   I&O's:    Intake/Output Summary (Last 24 hours) at 04/08/11 1038 Last data filed at 04/08/11 1007  Gross per 24 hour  Intake    370 ml  Output    875 ml  Net   -505 ml   TELEMETRY: Reviewed telemetry pt in paced rhythm, AFib.     PHYSICAL EXAM General: Well developed, well nourished, Head:    Normal cephalic and atramatic  Lungs:   Clear bilaterally to auscultation and percussion. Heart:   Tachycardic S1 S2  Extremities:  No  edema.   Neuro: Alert and oriented X 3. Psych:  Good affect, responds appropriately   LABS: Basic Metabolic Panel:  Basename 04/06/11 0500  NA 141  K 4.0  CL 109  CO2 26  GLUCOSE 102*  BUN 10  CREATININE 0.70  CALCIUM 7.8*  MG --  PHOS --   Liver Function Tests: No results found for this basename: AST:2,ALT:2,ALKPHOS:2,BILITOT:2,PROT:2,ALBUMIN:2 in the last 72 hours No results found for this basename: LIPASE:2,AMYLASE:2 in the last 72 hours CBC:  Basename 04/07/11 0626 04/06/11 0500  WBC 7.6 8.4  NEUTROABS -- --  HGB 9.2* 8.8*  HCT 31.3* 29.1*  MCV 82.6 83.6  PLT 262 222   Cardiac Enzymes: No results found for this basename: CKTOTAL:3,CKMB:3,CKMBINDEX:3,TROPONINI:3 in the last 72 hours BNP: No components found with this basename: POCBNP:3 D-Dimer: No results found for this basename: DDIMER:2 in the last 72 hours Hemoglobin A1C: No results found for this basename: HGBA1C in the last 72 hours Fasting Lipid Panel: No results found for this basename:  CHOL,HDL,LDLCALC,TRIG,CHOLHDL,LDLDIRECT in the last 72 hours Thyroid Function Tests: No results found for this basename: TSH,T4TOTAL,FREET3,T3FREE,THYROIDAB in the last 72 hours Anemia Panel: No results found for this basename: VITAMINB12,FOLATE,FERRITIN,TIBC,IRON,RETICCTPCT in the last 72 hours Coag Panel:   Lab Results  Component Value Date   INR 1.57* 04/08/2011   INR 2.04* 04/07/2011   INR 2.20* 04/06/2011    ASSESSMENT: AFib  PLAN:   Coumadin restarted.  May not need much coumadin while she is not eating a lot.  Advancing diet.  She is getting heparin bridge at this time.  INR subtherapeutic today.  Heparin not mandatory at this time if it poses increased risk of bleeding from a surgical standpoint.  If okay with surgery, ok to continue heparin.  Start IV diltiazem for AFib with RVR.  Pacer in place so no concerns about bradycardia.  BP will tolerate. Corky Crafts., MD  04/08/2011  10:38 AM

## 2011-04-08 NOTE — Progress Notes (Signed)
5 Days Post-Op  Subjective: Bowels moving.  Tolerating liquid diet  Objective: Vital signs in last 24 hours: Temp:  [97.4 F (36.3 C)-98.8 F (37.1 C)] 98.2 F (36.8 C) (01/23 0520) Pulse Rate:  [59-134] 134  (01/23 0520) Resp:  [16-20] 20  (01/23 0520) BP: (159-165)/(71-112) 164/112 mmHg (01/23 0520) SpO2:  [97 %-100 %] 97 % (01/23 0520) Weight:  [185 lb 3 oz (84 kg)] 185 lb 3 oz (84 kg) (01/23 0520) Last BM Date: 04/03/11  Intake/Output from previous day: 01/22 0701 - 01/23 0700 In: 710 [P.O.:360; I.V.:350] Out: 2075 [Urine:2075] Intake/Output this shift:    PE: Abd-soft, wicks removed and wound is clean  Lab Results:   Basename 04/07/11 0626 04/06/11 0500  WBC 7.6 8.4  HGB 9.2* 8.8*  HCT 31.3* 29.1*  PLT 262 222   BMET  Basename 04/06/11 0500  NA 141  K 4.0  CL 109  CO2 26  GLUCOSE 102*  BUN 10  CREATININE 0.70  CALCIUM 7.8*   PT/INR  Basename 04/08/11 0558 04/07/11 1140  LABPROT 19.1* 23.4*  INR 1.57* 2.04*   Comprehensive Metabolic Panel:    Component Value Date/Time   NA 141 04/06/2011 0500   K 4.0 04/06/2011 0500   CL 109 04/06/2011 0500   CO2 26 04/06/2011 0500   BUN 10 04/06/2011 0500   CREATININE 0.70 04/06/2011 0500   GLUCOSE 102* 04/06/2011 0500   CALCIUM 7.8* 04/06/2011 0500   AST 31 04/01/2011 0754   ALT 19 04/01/2011 0754   ALKPHOS 50 04/01/2011 0754   BILITOT 0.2* 04/01/2011 0754   PROT 6.6 04/01/2011 0754   ALBUMIN 3.1* 04/01/2011 0754     Studies/Results: No results found.  Anti-infectives: Anti-infectives     Start     Dose/Rate Route Frequency Ordered Stop   04/03/11 1800   ciprofloxacin (CIPRO) IVPB 400 mg        400 mg 200 mL/hr over 60 Minutes Intravenous Every 12 hours 04/03/11 1649 04/03/11 2041   04/03/11 1800   metroNIDAZOLE (FLAGYL) IVPB 500 mg        500 mg 100 mL/hr over 60 Minutes Intravenous Every 8 hours 04/03/11 1649 04/03/11 1854   04/03/11 0815   ciprofloxacin (CIPRO) IVPB 400 mg        400 mg 200 mL/hr  over 60 Minutes Intravenous To Surgery 04/03/11 0810 04/03/11 0825   04/03/11 0815   metroNIDAZOLE (FLAGYL) IVPB 500 mg        500 mg 100 mL/hr over 60 Minutes Intravenous To Surgery 04/03/11 0810 04/03/11 0842   04/01/11 1921   ertapenem (INVANZ) 1 g in sodium chloride 0.9 % 50 mL IVPB  Status:  Discontinued        1 g 100 mL/hr over 30 Minutes Intravenous 60 min pre-op 04/01/11 1921 04/03/11 1610          Assessment Principal Problem:  *Atrial flutter with rapid ventricular response-HR 64 during my exam;Coumadin restarted Active Problems:  Hypertension  Diastolic CHF, acute on chronic  Anemia  Cancer of sigmoid colon s/p sigmoid colectomy for T3N0 lesion on 1/18-stable bowel function    LOS: 12 days   Plan: Advance diet. Saline lock IV.   Loredana Medellin J 04/08/2011

## 2011-04-09 DIAGNOSIS — I4891 Unspecified atrial fibrillation: Secondary | ICD-10-CM

## 2011-04-09 LAB — CBC
HCT: 33.8 % — ABNORMAL LOW (ref 36.0–46.0)
Hemoglobin: 10.5 g/dL — ABNORMAL LOW (ref 12.0–15.0)
MCV: 81.4 fL (ref 78.0–100.0)
RBC: 4.15 MIL/uL (ref 3.87–5.11)
WBC: 7.7 10*3/uL (ref 4.0–10.5)

## 2011-04-09 LAB — HEPARIN LEVEL (UNFRACTIONATED): Heparin Unfractionated: <0.1 IU/mL — ABNORMAL LOW (ref 0.30–0.70)

## 2011-04-09 MED ORDER — WARFARIN SODIUM 2.5 MG PO TABS
2.5000 mg | ORAL_TABLET | Freq: Once | ORAL | Status: AC
  Administered 2011-04-09: 2.5 mg via ORAL
  Filled 2011-04-09: qty 1

## 2011-04-09 MED ORDER — ALUM & MAG HYDROXIDE-SIMETH 200-200-20 MG/5ML PO SUSP
15.0000 mL | ORAL | Status: DC | PRN
  Administered 2011-04-09: 30 mL via ORAL
  Filled 2011-04-09: qty 30

## 2011-04-09 MED ORDER — FAMOTIDINE 20 MG PO TABS
20.0000 mg | ORAL_TABLET | Freq: Two times a day (BID) | ORAL | Status: DC
  Administered 2011-04-09 – 2011-04-13 (×8): 20 mg via ORAL
  Filled 2011-04-09 (×9): qty 1

## 2011-04-09 NOTE — Progress Notes (Addendum)
ANTICOAGULATION CONSULT NOTE - Follow Up Consult  Pharmacy Consult for Coumadin Indication: atrial fibrillation  Assessment: 78 year old on Coumadin PTA for AFib, now sigmoid colectomy Pod 6, INR 1.85.    Goal of Therapy:  INR = 2 to 3   Plan:  1) Coumadin 2.5 mg today. 2) Check Heparin level later this afternoon. 3)  F/u am protime/INR    Talbert Cage, PharmD. Clinical Pharmacist Pager:  [phone] 04/08/2011,8:12 PM   Addum:  Heparin level therapeutic @0 .4 units/ml.  Cont drip at 1300 units.hr.  F/u am labs.

## 2011-04-09 NOTE — Progress Notes (Signed)
4098. Patient's cardizem drip was stopped at 0554 and a po dose of cardizem was given according to physician orders. However around 0618 patient's heart rate increased to 132 sustaining. Patient is assymptomatic. Dr. Eldridge Dace was called and notified. Orders to restart patient back on cardizem drip at 5mg /hr IV to continue were given. Patient will be restarted back on cardizem drip.

## 2011-04-09 NOTE — Progress Notes (Signed)
Clinical Social Worker received a phone call from [place] at [facility] indicating that they were not able to view clinical documentation in [facility].  CSW submitted all requested clinical information to Tammy via fax at fax #[phone].  CSW to continue to follow and assist as needed.  Angelia Mould, MSW, [place] [phone]

## 2011-04-09 NOTE — Progress Notes (Signed)
6 Days Post-Op  Subjective: Passing some gas.  Eating some although appetite is not that good.  No N/V.  Objective: Vital signs in last 24 hours: Temp:  [97.5 F (36.4 C)-98.3 F (36.8 C)] 97.5 F (36.4 C) (01/24 0531) Pulse Rate:  [60-133] 110  (01/24 0531) Resp:  [18] 18  (01/24 0531) BP: (140-172)/(70-112) 156/93 mmHg (01/24 0531) SpO2:  [96 %-100 %] 96 % (01/24 0531) Last BM Date: 04/08/11  Intake/Output from previous day: 01/23 0701 - 01/24 0700 In: 7393.4 [P.O.:230; I.V.:7163.4] Out: 700 [Urine:700] Intake/Output this shift:    PE: Abd-soft, nontender, dressing dry  Lab Results:   Basename 04/09/11 0505 04/07/11 0626  WBC 7.7 7.6  HGB 10.5* 9.2*  HCT 33.8* 31.3*  PLT 329 262   BMET No results found for this basename: NA:2,K:2,CL:2,CO2:2,GLUCOSE:2,BUN:2,CREATININE:2,CALCIUM:2 in the last 72 hours PT/INR  Basename 04/09/11 0505 04/08/11 0558  LABPROT 21.7* 19.1*  INR 1.85* 1.57*   Comprehensive Metabolic Panel:    Component Value Date/Time   NA 141 04/06/2011 0500   K 4.0 04/06/2011 0500   CL 109 04/06/2011 0500   CO2 26 04/06/2011 0500   BUN 10 04/06/2011 0500   CREATININE 0.70 04/06/2011 0500   GLUCOSE 102* 04/06/2011 0500   CALCIUM 7.8* 04/06/2011 0500   AST 31 04/01/2011 0754   ALT 19 04/01/2011 0754   ALKPHOS 50 04/01/2011 0754   BILITOT 0.2* 04/01/2011 0754   PROT 6.6 04/01/2011 0754   ALBUMIN 3.1* 04/01/2011 0754     Studies/Results: No results found.  Anti-infectives: Anti-infectives     Start     Dose/Rate Route Frequency Ordered Stop   04/03/11 1800   ciprofloxacin (CIPRO) IVPB 400 mg        400 mg 200 mL/hr over 60 Minutes Intravenous Every 12 hours 04/03/11 1649 04/03/11 2041   04/03/11 1800   metroNIDAZOLE (FLAGYL) IVPB 500 mg        500 mg 100 mL/hr over 60 Minutes Intravenous Every 8 hours 04/03/11 1649 04/03/11 1854   04/03/11 0815   ciprofloxacin (CIPRO) IVPB 400 mg        400 mg 200 mL/hr over 60 Minutes Intravenous To Surgery  04/03/11 0810 04/03/11 0825   04/03/11 0815   metroNIDAZOLE (FLAGYL) IVPB 500 mg        500 mg 100 mL/hr over 60 Minutes Intravenous To Surgery 04/03/11 0810 04/03/11 0842   04/01/11 1921   ertapenem (INVANZ) 1 g in sodium chloride 0.9 % 50 mL IVPB  Status:  Discontinued        1 g 100 mL/hr over 30 Minutes Intravenous 60 min pre-op 04/01/11 1921 04/03/11 1610          Assessment Principal Problem:  *Atrial flutter with rapid ventricular response-still on IV Cardizem Active Problems:  Hypertension  Diastolic CHF, acute on chronic  Anemia-stable  Cancer of sigmoid colon s/p sigmoid colectomy for T3N0 lesion on 1/18-progressing well from this standpoint.    LOS: 13 days   Plan: Wait for medical issues to stabilize.  SNF.   Kolt Mcwhirter J 04/09/2011

## 2011-04-09 NOTE — Progress Notes (Signed)
2245 Patient had an episode of nausea with vomiting while during shift report tonight around 1920. Day shift nurse went into the room to assess patient. She found that patient had vomited and the vomit looked like coffee grounds. She said it was large amount. I was also told that patient had experienced some heartburn today and received prn medication for that. I gave the patient prn medication to help with nausea. The patient has not vomited again since. I attempted to call the answering service for the GI physician/surgeon on call but couldn't reach anyone after three attempts. I will write a note to notify the MD in patient's chart and report to the oncoming nurse in the am for any additional follow-up. Will continue to monitor.

## 2011-04-09 NOTE — Progress Notes (Signed)
PT Cancellation Note  Treatment cancelled today due to patient's refusal to participate. Patient complained of heartburn.  Assisted up in bed with [facility] elevated and relayed to RN. Sheran Lawless 04/09/2011, 4:36 PM

## 2011-04-09 NOTE — Progress Notes (Signed)
Clinical Social Worker received notification from [facility] that pt has a bed available for [facility].  Please update CSW to dc plans as available.     Angelia Mould, MSW, [place] [phone]

## 2011-04-09 NOTE — Progress Notes (Signed)
ANTICOAGULATION CONSULT NOTE - Follow Up Consult  Pharmacy Consult for heparin Indication: atrial fibrillation  Allergies  Allergen Reactions  . Lasix (Furosemide) Swelling  . Penicillins Swelling    Mouth swelling and redness  . Sulfa Antibiotics Rash    Patient Measurements: Height: 5\' 8"  (172.7 cm) Weight: 185 lb 3 oz (84 kg) (bed scale) IBW/kg (Calculated) : 63.9   Vital Signs: Temp: 97.5 F (36.4 C) (01/24 0531) Temp src: Oral (01/24 0531) BP: 156/93 mmHg (01/24 0531) Pulse Rate: 110  (01/24 0531)  Labs:  Basename 04/09/11 0505 04/08/11 1917 04/08/11 0558 04/07/11 1140 04/07/11 0626  HGB 10.5* -- -- -- 9.2*  HCT 33.8* -- -- -- 31.3*  PLT 329 -- -- -- 262  APTT -- -- -- -- --  LABPROT 21.7* -- 19.1* 23.4* --  INR 1.85* -- 1.57* 2.04* --  HEPARINUNFRC <0.10* <0.10* -- -- --  CREATININE -- -- -- -- --  CKTOTAL -- -- -- -- --  CKMB -- -- -- -- --  TROPONINI -- -- -- -- --   Estimated Creatinine Clearance: 57.3 ml/min (by C-G formula based on Cr of 0.7).   Medications:  Scheduled:    . alvimopan  12 mg Oral BID  . diltiazem  10 mg Intravenous Once  . feeding supplement  1 Container Oral TID WC  . lisinopril  10 mg Oral Daily  . sotalol  160 mg Oral BID  . warfarin  5 mg Oral ONCE-1800  . DISCONTD: diltiazem  60 mg Oral Q8H   Infusions:    . diltiazem (CARDIZEM) infusion 5 mg/hr (04/09/11 0631)  . heparin 1,000 Units/hr (04/08/11 2048)  . DISCONTD: 0.9 % NaCl with KCl 20 mEq / L Stopped (04/08/11 0830)    Assessment: 78yo female remains undetectable on heparin despite rate increase.  Goal of Therapy:  Heparin level 0.3-0.7 units/ml   Plan:  Will increase gtt by ~4 units/kg/hr to 1300 units/hr and check level in 8hr.  Colleen Can PharmD BCPS 04/09/2011,6:46 AM

## 2011-04-10 ENCOUNTER — Inpatient Hospital Stay (HOSPITAL_COMMUNITY): Payer: Medicare Other

## 2011-04-10 LAB — CBC
MCH: 24.5 pg — ABNORMAL LOW (ref 26.0–34.0)
MCHC: 30.4 g/dL (ref 30.0–36.0)
MCV: 80.5 fL (ref 78.0–100.0)
Platelets: 304 10*3/uL (ref 150–400)
RDW: 23.7 % — ABNORMAL HIGH (ref 11.5–15.5)

## 2011-04-10 LAB — PROTIME-INR
INR: 2.31 — ABNORMAL HIGH (ref 0.00–1.49)
Prothrombin Time: 25.8 seconds — ABNORMAL HIGH (ref 11.6–15.2)

## 2011-04-10 MED ORDER — WARFARIN SODIUM 2 MG PO TABS
2.0000 mg | ORAL_TABLET | Freq: Once | ORAL | Status: AC
  Administered 2011-04-10: 2 mg via ORAL
  Filled 2011-04-10: qty 1

## 2011-04-10 NOTE — Progress Notes (Signed)
Pt HR was 125 sustained. BP was 147/93.  Dr. Eldridge Dace ordered to increase cardizem infusion to 12mL/H.  20 min later BP was 156/83 and HR was 60.  We will continue to monitor.

## 2011-04-10 NOTE — Progress Notes (Signed)
7 Days Post-Op  Subjective: Vomited a large volume last night.  Feels better this AM.  Objective: Vital signs in last 24 hours: Temp:  [97.2 F (36.2 C)-98.5 F (36.9 C)] 98.5 F (36.9 C) (01/25 0607) Pulse Rate:  [61-110] 64  (01/25 0607) Resp:  [18-24] 20  (01/25 0607) BP: (126-165)/(79-106) 142/79 mmHg (01/25 0607) SpO2:  [98 %-99 %] 99 % (01/25 0607) Weight:  [187 lb 9.8 oz (85.1 kg)] 187 lb 9.8 oz (85.1 kg) (01/25 0607) Last BM Date: 04/09/11  Intake/Output from previous day: 01/24 0701 - 01/25 0700 In: 1000.6 [P.O.:582; I.V.:416.6; IV Piggyback:2] Out: 201 [Urine:200; Stool:1] Intake/Output this shift:    PE: Abd-sl firm and distended this AM, hypoactive BS, incision c/d/i  Lab Results:   Basename 04/10/11 0537 04/09/11 0505  WBC 9.0 7.7  HGB 10.2* 10.5*  HCT 33.5* 33.8*  PLT 304 329   BMET No results found for this basename: NA:2,K:2,CL:2,CO2:2,GLUCOSE:2,BUN:2,CREATININE:2,CALCIUM:2 in the last 72 hours PT/INR  Basename 04/10/11 0537 04/09/11 0505  LABPROT 25.8* 21.7*  INR 2.31* 1.85*   Comprehensive Metabolic Panel:    Component Value Date/Time   NA 141 04/06/2011 0500   K 4.0 04/06/2011 0500   CL 109 04/06/2011 0500   CO2 26 04/06/2011 0500   BUN 10 04/06/2011 0500   CREATININE 0.70 04/06/2011 0500   GLUCOSE 102* 04/06/2011 0500   CALCIUM 7.8* 04/06/2011 0500   AST 31 04/01/2011 0754   ALT 19 04/01/2011 0754   ALKPHOS 50 04/01/2011 0754   BILITOT 0.2* 04/01/2011 0754   PROT 6.6 04/01/2011 0754   ALBUMIN 3.1* 04/01/2011 0754     Studies/Results: No results found.  Anti-infectives: Anti-infectives     Start     Dose/Rate Route Frequency Ordered Stop   04/03/11 1800   ciprofloxacin (CIPRO) IVPB 400 mg        400 mg 200 mL/hr over 60 Minutes Intravenous Every 12 hours 04/03/11 1649 04/03/11 2041   04/03/11 1800   metroNIDAZOLE (FLAGYL) IVPB 500 mg        500 mg 100 mL/hr over 60 Minutes Intravenous Every 8 hours 04/03/11 1649 04/03/11 1854   04/03/11 0815   ciprofloxacin (CIPRO) IVPB 400 mg        400 mg 200 mL/hr over 60 Minutes Intravenous To Surgery 04/03/11 0810 04/03/11 0825   04/03/11 0815   metroNIDAZOLE (FLAGYL) IVPB 500 mg        500 mg 100 mL/hr over 60 Minutes Intravenous To Surgery 04/03/11 0810 04/03/11 0842   04/01/11 1921   ertapenem (INVANZ) 1 g in sodium chloride 0.9 % 50 mL IVPB  Status:  Discontinued        1 g 100 mL/hr over 30 Minutes Intravenous 60 min pre-op 04/01/11 1921 04/03/11 1610          Assessment Principal Problem:  *Atrial flutter with rapid ventricular response-better on Cardizem drip Active Problems:  Hypertension  Diastolic CHF, acute on chronic  Anemia  Cancer of sigmoid colon s/p sigmoid colectomy for T3N0 lesion on 1/18-has developed a mild ileus; may be related to her Cardizem drip  Chronic anticoagulated state-INR therapeutic on Coumadin.    LOS: 14 days   Plan: Stop Heparin.  Change diet to full liquids.  Check abdominal x-rays.  Ask Cardiology to see again about stopping Cardizem and using something different.   [name] J 04/10/2011

## 2011-04-10 NOTE — Progress Notes (Signed)
SUBJECTIVE:  Vomited, possible ileus.  Surgery ok with coumadin.  HR increased.  BP increased.  OBJECTIVE:   Vitals:   Filed Vitals:   04/10/11 0955 04/10/11 1300 04/10/11 1331 04/10/11 1441  BP: 151/79 147/93 156/83 147/84  Pulse: 65 125 60 62  Temp: 97.3 F (36.3 C)   97.8 F (36.6 C)  TempSrc: Oral   Axillary  Resp: 22   20  Height:      Weight:      SpO2: 100%   100%   I&O's:    Intake/Output Summary (Last 24 hours) at 04/10/11 1629 Last data filed at 04/10/11 1346  Gross per 24 hour  Intake 658.64 ml  Output      0 ml  Net 658.64 ml   TELEMETRY: Reviewed telemetry pt in paced rhythm, AFib.     PHYSICAL EXAM General: Well developed, well nourished, Head:    Normal cephalic and atramatic  Lungs:   Clear bilaterally to auscultation Heart:   Tachycardic S1 S2  Extremities:  No  edema.   Neuro: Alert and oriented X 3. Psych:  Good affect, responds appropriately   LABS: Basic Metabolic Panel: No results found for this basename: NA:2,K:2,CL:2,CO2:2,GLUCOSE:2,BUN:2,CREATININE:2,CALCIUM:2,MG:2,PHOS:2 in the last 72 hours Liver Function Tests: No results found for this basename: AST:2,ALT:2,ALKPHOS:2,BILITOT:2,PROT:2,ALBUMIN:2 in the last 72 hours No results found for this basename: LIPASE:2,AMYLASE:2 in the last 72 hours CBC:  Basename 04/10/11 0537 04/09/11 0505  WBC 9.0 7.7  NEUTROABS -- --  HGB 10.2* 10.5*  HCT 33.5* 33.8*  MCV 80.5 81.4  PLT 304 329   Cardiac Enzymes: No results found for this basename: CKTOTAL:3,CKMB:3,CKMBINDEX:3,TROPONINI:3 in the last 72 hours BNP: No components found with this basename: POCBNP:3 D-Dimer: No results found for this basename: DDIMER:2 in the last 72 hours Hemoglobin A1C: No results found for this basename: HGBA1C in the last 72 hours Fasting Lipid Panel: No results found for this basename: CHOL,HDL,LDLCALC,TRIG,CHOLHDL,LDLDIRECT in the last 72 hours Thyroid Function Tests: No results found for this basename:  TSH,T4TOTAL,FREET3,T3FREE,THYROIDAB in the last 72 hours Anemia Panel: No results found for this basename: VITAMINB12,FOLATE,FERRITIN,TIBC,IRON,RETICCTPCT in the last 72 hours Coag Panel:   Lab Results  Component Value Date   INR 2.31* 04/10/2011   INR 1.85* 04/09/2011   INR 1.57* 04/08/2011    ASSESSMENT: AFib  PLAN:   Coumadin restarted.  May not need much coumadin while she is not eating a lot.  Advancing diet.   Increase  IV diltiazem for AFib with RVR.  Pacer in place so no concerns about bradycardia.  BP will tolerate.  Lisinopril for HTN.  Change diltiazem to oral when tolerating solids.   [name] S. 04/09/11

## 2011-04-10 NOTE — Progress Notes (Signed)
Clinical Social Worker spoke with Tammy at [facility] who confirmed that pt will have a bed available.  CSW to continue to follow and assist as needed.  Angelia Mould, MSW, [place] [phone]

## 2011-04-10 NOTE — Progress Notes (Signed)
ANTICOAGULATION CONSULT NOTE - Follow Up Consult  Pharmacy Consult for Coumadin/ Heparin Indication: atrial fibrillation  Assessment: 78 year old on Coumadin PTA for AFib, now sigmoid colectomy Pod 7, INR 2.31.  Per RNs  Note had some coffee ground emesis last night.  H/H stable from yesterday.  Heparin level 0.29.  Anticipate dc heparin today.  Goal of Therapy:  INR = 2 to 3   Plan:  1) Coumadin 2 mg today. 2)  F/u am protime/INR    Talbert Cage, PharmD. Clinical Pharmacist Pager:  [phone] 04/08/2011,8:12 PM

## 2011-04-10 NOTE — Progress Notes (Signed)
SUBJECTIVE:  Tolerating solids.  Surgery ok with coumadin.  HR increased.  BP increased.  OBJECTIVE:   Vitals:   Filed Vitals:   04/10/11 0955 04/10/11 1300 04/10/11 1331 04/10/11 1441  BP: 151/79 147/93 156/83 147/84  Pulse: 65 125 60 62  Temp: 97.3 F (36.3 C)   97.8 F (36.6 C)  TempSrc: Oral   Axillary  Resp: 22   20  Height:      Weight:      SpO2: 100%   100%   I&O's:    Intake/Output Summary (Last 24 hours) at 04/10/11 1453 Last data filed at 04/10/11 1346  Gross per 24 hour  Intake 658.64 ml  Output      0 ml  Net 658.64 ml   TELEMETRY: Reviewed telemetry pt in paced rhythm, AFib.     PHYSICAL EXAM General: Well developed, well nourished, Head:    Normal cephalic and atramatic  Lungs:   Clear bilaterally to auscultation and percussion. Heart:   Tachycardic S1 S2  Extremities:  No  edema.   Neuro: Alert and oriented X 3. Psych:  Good affect, responds appropriately   LABS: Basic Metabolic Panel: No results found for this basename: NA:2,K:2,CL:2,CO2:2,GLUCOSE:2,BUN:2,CREATININE:2,CALCIUM:2,MG:2,PHOS:2 in the last 72 hours Liver Function Tests: No results found for this basename: AST:2,ALT:2,ALKPHOS:2,BILITOT:2,PROT:2,ALBUMIN:2 in the last 72 hours No results found for this basename: LIPASE:2,AMYLASE:2 in the last 72 hours CBC:  Basename 04/10/11 0537 04/09/11 0505  WBC 9.0 7.7  NEUTROABS -- --  HGB 10.2* 10.5*  HCT 33.5* 33.8*  MCV 80.5 81.4  PLT 304 329   Cardiac Enzymes: No results found for this basename: CKTOTAL:3,CKMB:3,CKMBINDEX:3,TROPONINI:3 in the last 72 hours BNP: No components found with this basename: POCBNP:3 D-Dimer: No results found for this basename: DDIMER:2 in the last 72 hours Hemoglobin A1C: No results found for this basename: HGBA1C in the last 72 hours Fasting Lipid Panel: No results found for this basename: CHOL,HDL,LDLCALC,TRIG,CHOLHDL,LDLDIRECT in the last 72 hours Thyroid Function Tests: No results found for this  basename: TSH,T4TOTAL,FREET3,T3FREE,THYROIDAB in the last 72 hours Anemia Panel: No results found for this basename: VITAMINB12,FOLATE,FERRITIN,TIBC,IRON,RETICCTPCT in the last 72 hours Coag Panel:   Lab Results  Component Value Date   INR 2.31* 04/10/2011   INR 1.85* 04/09/2011   INR 1.57* 04/08/2011    ASSESSMENT: AFib  PLAN:   Coumadin restarted.  May not need much coumadin while she is not eating a lot.  Advancing diet.  She is getting heparin bridge at this time.  INR subtherapeutic today.  Heparin not mandatory at this time if it poses increased risk of bleeding from a surgical standpoint.  If okay with surgery, ok to continue heparin.  ReStart IV diltiazem for AFib with RVR.  Pacer in place so no concerns about bradycardia.  BP will tolerate.  Lisinopril for HTN. Ossie Yebra S. 04/09/11

## 2011-04-10 NOTE — Progress Notes (Signed)
Pt HR consistently 60 - 62, BP 144/78, Dr. Anne Fu notified of pt HR per MD to maintain Cardizem gtt at 10mg /hr and give scheduled dose of betapace. Will continue to monitor pt. [name], [place]

## 2011-04-11 LAB — CBC
MCH: 24.3 pg — ABNORMAL LOW (ref 26.0–34.0)
MCHC: 30 g/dL (ref 30.0–36.0)
MCV: 81 fL (ref 78.0–100.0)
Platelets: 301 10*3/uL (ref 150–400)
RBC: 3.95 MIL/uL (ref 3.87–5.11)
RDW: 23.7 % — ABNORMAL HIGH (ref 11.5–15.5)

## 2011-04-11 LAB — PROTIME-INR: Prothrombin Time: 25.4 seconds — ABNORMAL HIGH (ref 11.6–15.2)

## 2011-04-11 MED ORDER — ACETAMINOPHEN 325 MG PO TABS: 650.0000 mg | ORAL_TABLET | Freq: Four times a day (QID) | ORAL | Status: DC | PRN

## 2011-04-11 MED ORDER — DILTIAZEM HCL 60 MG PO TABS
60.0000 mg | ORAL_TABLET | Freq: Four times a day (QID) | ORAL | Status: DC
  Administered 2011-04-11 – 2011-04-12 (×4): 60 mg via ORAL
  Filled 2011-04-11 (×8): qty 1

## 2011-04-11 MED ORDER — OXYCODONE-ACETAMINOPHEN 5-325 MG PO TABS: 1.0000 | ORAL_TABLET | ORAL | Status: DC | PRN

## 2011-04-11 MED ORDER — WARFARIN SODIUM 2.5 MG PO TABS
2.5000 mg | ORAL_TABLET | Freq: Once | ORAL | Status: AC
  Administered 2011-04-11: 2.5 mg via ORAL
  Filled 2011-04-11: qty 1

## 2011-04-11 MED ORDER — TRAMADOL HCL 50 MG PO TABS
100.0000 mg | ORAL_TABLET | Freq: Two times a day (BID) | ORAL | Status: DC | PRN
  Filled 2011-04-11: qty 2

## 2011-04-11 NOTE — Progress Notes (Signed)
Subjective:  78 year old with pacemaker afib recent RVR in the setting of colectomy.  Feels better. No SOB, no CP.  Tolerating clears. I spoke with surgery.  Objective:  Vital Signs in the last 24 hours: Temp:  [97.4 F (36.3 C)-98.5 F (36.9 C)] 97.4 F (36.3 C) (01/26 0450) Pulse Rate:  [59-125] 59  (01/26 0450) Resp:  [18-20] 18  (01/26 0450) BP: (127-156)/(58-93) 127/58 mmHg (01/26 1043) SpO2:  [99 %-100 %] 100 % (01/26 0450) Weight:  [85.9 kg (189 lb 6 oz)] 85.9 kg (189 lb 6 oz) (01/26 0450)  Intake/Output from previous day: 01/25 0701 - 01/26 0700 In: 1579.8 [P.O.:720; I.V.:859.8] Out: 600 [Urine:600]   Physical Exam: General: Well developed, well nourished, in no acute distress. Head:  Normocephalic and atraumatic. Lungs: Clear to auscultation and percussion. Heart: Normal S1 and S2.  No murmur, rubs or gallops.  Extremities: No clubbing or cyanosis. No edema. Neurologic: Alert and oriented x 3.    Lab Results:  Basename 04/11/11 0500 04/10/11 0537  WBC 8.1 9.0  HGB 9.6* 10.2*  PLT 301 304   Dg Abd 2 Views  04/10/2011  *RADIOLOGY REPORT*  Clinical Data: Postop, question ileus  ABDOMEN - 2 VIEW  Comparison: 03/31/2011  Findings: Midline lower abdomen and pelvis skin staples are noted. Surgical clips are noted within pelvis.  Moderate gaseous distended small bowel loops throughout the abdomen with air-fluid levels suspicious for ileus or partial bowel obstruction. No free abdominal air.  IMPRESSION:  Surgical clips are noted within pelvis.  Moderate gaseous distended small bowel loops throughout the abdomen with air-fluid levels suspicious for ileus or partial bowel obstruction.  Original Report Authenticated By: Natasha Mead, M.D.   Personally viewed.   Telemetry: Yesterday - RVR afib, now a paced normal rate.  Personally viewed.   Assessment/Plan:  Principal Problem:  *Atrial flutter with rapid ventricular response Active Problems:  Hypertension  Diastolic CHF,  acute on chronic  Anemia  Cancer of sigmoid colon s/p sigmoid colectomy for T3N0 lesion on 1/18  Now that she is tolerating PO's, I will change to PO diltiazem. 60 PO Q6.  Continue sotalol.  INR goal 2-3.  Improved.  Breathing well.    SKAINS, MARK 04/11/2011, 11:02 AM

## 2011-04-11 NOTE — Progress Notes (Signed)
ANTICOAGULATION CONSULT NOTE - Follow Up Consult  Pharmacy Consult for Coumadin Indication: atrial fibrillation  Assessment: 78 year old on Coumadin PTA for AFib, now sigmoid colectomy Pod 8, INR 2.27. 2mg  coumadin given last night. Heparin d/c yesterday. No further notes of coffee ground emesis. H/H slight drop from yesterday. PTA pt on coumadin 2.5mg  on Thursday and 5mg  all other days.   Goal of Therapy:  INR 2-3   Plan:  1. Coumadin 2.5 mg po x 1 today at 1800 2. F/u daily PT/INR   Thank you,  Brett Fairy, PharmD Pager: [phone]  04/11/2011 11:53 AM   Allergies  Allergen Reactions  . Lasix (Furosemide) Swelling  . Penicillins Swelling    Mouth swelling and redness  . Sulfa Antibiotics Rash    Patient Measurements: Height: 5\' 8"  (172.7 cm) Weight: 189 lb 6 oz (85.9 kg) IBW/kg (Calculated) : 63.9   Vital Signs: Temp: 97.4 F (36.3 C) (01/26 0450) Temp src: Oral (01/26 0450) BP: 127/58 mmHg (01/26 1043) Pulse Rate: 59  (01/26 0450)  Labs:  Basename 04/11/11 0500 04/10/11 0537 04/09/11 1427 04/09/11 0505  HGB 9.6* 10.2* -- --  HCT 32.0* 33.5* -- 33.8*  PLT 301 304 -- 329  APTT -- -- -- --  LABPROT 25.4* 25.8* -- 21.7*  INR 2.27* 2.31* -- 1.85*  HEPARINUNFRC -- 0.29* 0.40 <0.10*  CREATININE -- -- -- --  CKTOTAL -- -- -- --  CKMB -- -- -- --  TROPONINI -- -- -- --   Estimated Creatinine Clearance: 57.9 ml/min (by C-G formula based on Cr of 0.7).  Medications:  Scheduled:    . alvimopan  12 mg Oral BID  . diltiazem  60 mg Oral Q6H  . famotidine  20 mg Oral BID  . feeding supplement  1 Container Oral TID WC  . lisinopril  10 mg Oral Daily  . sotalol  160 mg Oral BID  . warfarin  2 mg Oral ONCE-1800   Infusions:    . DISCONTD: diltiazem (CARDIZEM) infusion 10 mg/hr (04/11/11 0824)   PRN: sodium chloride, acetaminophen, alum & mag hydroxide-simeth, diphenhydrAMINE, diphenhydrAMINE, morphine injection, naloxone, ondansetron (ZOFRAN) IV, ondansetron  (ZOFRAN) IV, ondansetron, oxyCODONE-acetaminophen, sodium chloride, traMADol   Bufford Buttner, [place] N 04/11/2011,11:47 AM

## 2011-04-11 NOTE — Progress Notes (Signed)
8 Days Post-Op  Subjective: Te[place] : mostly pacing in 60's. +BM yesterday and a small amount this AM.  No further nausea, on clears, doesn't really like them.     Objective: Vital signs in last 24 hours: Temp:  [97.4 F (36.3 C)-98.5 F (36.9 C)] 97.4 F (36.3 C) (01/26 0450) Pulse Rate:  [59-125] 59  (01/26 0450) Resp:  [18-20] 18  (01/26 0450) BP: (127-156)/(58-93) 127/58 mmHg (01/26 1043) SpO2:  [99 %-100 %] 100 % (01/26 0450) Weight:  [85.9 kg (189 lb 6 oz)] 85.9 kg (189 lb 6 oz) (01/26 0450) Last BM Date: 04/09/11  Intake/Output from previous day: 04-25-22 0701 - 01/26 0700 In: 1579.8 [P.O.:720; I.V.:859.8] Out: 600 [Urine:600] Intake/Output this shift: Total I/O In: 240 [P.O.:240] Out: -   PE: Alert seems to be doing well. Chest, few rales at base, but overall sounds clear. Abd-Soft, nontender, +BS, +flatus,+Bm, Incision cleaned and redressed. Lab Results:   Basename 04/11/11 0500 04-26-11 0537  WBC 8.1 9.0  HGB 9.6* 10.2*  HCT 32.0* 33.5*  PLT 301 304   BMET No results found for this basename: NA:2,K:2,CL:2,CO2:2,GLUCOSE:2,BUN:2,CREATININE:2,CALCIUM:2 in the last 72 hours PT/INR  Basename 04/11/11 0500 2011/04/26 0537  LABPROT 25.4* 25.8*  INR 2.27* 2.31*   Comprehensive Metabolic Panel:    Component Value Date/Time   NA 141 04/06/2011 0500   K 4.0 04/06/2011 0500   CL 109 04/06/2011 0500   CO2 26 04/06/2011 0500   BUN 10 04/06/2011 0500   CREATININE 0.70 04/06/2011 0500   GLUCOSE 102* 04/06/2011 0500   CALCIUM 7.8* 04/06/2011 0500   AST 31 04/01/2011 0754   ALT 19 04/01/2011 0754   ALKPHOS 50 04/01/2011 0754   BILITOT 0.2* 04/01/2011 0754   PROT 6.6 04/01/2011 0754   ALBUMIN 3.1* 04/01/2011 0754     Studies/Results: Dg Abd 2 Views  04/26/2011  *RADIOLOGY REPORT*  Clinical Data: Postop, question ileus  ABDOMEN - 2 VIEW  Comparison: 03/31/2011  Findings: Midline lower abdomen and pelvis skin staples are noted. Surgical clips are noted within pelvis.  Moderate  gaseous distended small bowel loops throughout the abdomen with air-fluid levels suspicious for ileus or partial bowel obstruction. No free abdominal air.  IMPRESSION:  Surgical clips are noted within pelvis.  Moderate gaseous distended small bowel loops throughout the abdomen with air-fluid levels suspicious for ileus or partial bowel obstruction.  Original Report Authenticated By: Natasha Mead, M.D.    Anti-infectives: Anti-infectives     Start     Dose/Rate Route Frequency Ordered Stop   04/03/11 1800   ciprofloxacin (CIPRO) IVPB 400 mg        400 mg 200 mL/hr over 60 Minutes Intravenous Every 12 hours 04/03/11 1649 04/03/11 2041   04/03/11 1800   metroNIDAZOLE (FLAGYL) IVPB 500 mg        500 mg 100 mL/hr over 60 Minutes Intravenous Every 8 hours 04/03/11 1649 04/03/11 1854   04/03/11 0815   ciprofloxacin (CIPRO) IVPB 400 mg        400 mg 200 mL/hr over 60 Minutes Intravenous To Surgery 04/03/11 0810 04/03/11 0825   04/03/11 0815   metroNIDAZOLE (FLAGYL) IVPB 500 mg        500 mg 100 mL/hr over 60 Minutes Intravenous To Surgery 04/03/11 0810 04/03/11 0842   04/01/11 1921   ertapenem (INVANZ) 1 g in sodium chloride 0.9 % 50 mL IVPB  Status:  Discontinued        1 g 100 mL/hr over 30 Minutes  Intravenous 60 min pre-op 04/01/11 1921 04/03/11 1610          Assessment Principal Problem:  *Atrial flutter with rapid ventricular response-better on Cardizem drip Active Problems:  Hypertension  Diastolic CHF, acute on chronic  Anemia  Cancer of sigmoid colon s/p sigmoid colectomy for T3N0 lesion on 1/18-has developed a mild ileus; may be related to her Cardizem drip  Chronic anticoagulated state-INR therapeutic on Coumadin.    LOS: 15 days   Plan: Go to full liquid diet, mobilize, IS.  Cardiology is going to swithch her over to PO cardizem.  She plans to go to assisted living in [place] after D/C from hospital  Hopefully next week.  Julius Boniface 04/11/2011

## 2011-04-11 NOTE — Progress Notes (Signed)
Patient interviewed and examined.  Agree with Mr. Marlyne Beards' note.  Ileus resolving clinically. Advance diet. To assisted living in [place]. Next week.  POD #8  Amelita Risinger M. Derrell Lolling, M.D., [facility] General and Minimally invasive Surgery Breast and Colorectal Surgery Office:   [phone] Pager:   [phone]

## 2011-04-12 LAB — CBC
MCH: 25 pg — ABNORMAL LOW (ref 26.0–34.0)
MCHC: 30.9 g/dL (ref 30.0–36.0)
MCV: 81 fL (ref 78.0–100.0)
Platelets: 355 10*3/uL (ref 150–400)
RBC: 4.32 MIL/uL (ref 3.87–5.11)

## 2011-04-12 MED ORDER — DILTIAZEM HCL ER COATED BEADS 240 MG PO CP24
240.0000 mg | ORAL_CAPSULE | Freq: Every day | ORAL | Status: DC
  Administered 2011-04-12 – 2011-04-13 (×2): 240 mg via ORAL
  Filled 2011-04-12 (×2): qty 1

## 2011-04-12 MED ORDER — WARFARIN SODIUM 5 MG PO TABS
5.0000 mg | ORAL_TABLET | Freq: Once | ORAL | Status: AC
  Administered 2011-04-12: 5 mg via ORAL
  Filled 2011-04-12: qty 1

## 2011-04-12 NOTE — Progress Notes (Signed)
ANTICOAGULATION CONSULT NOTE - Follow Up Consult  Pharmacy Consult for Coumadin Indication: atrial fibrillation  Assessment: 78 year old female on Coumadin PTA for AFib, now s/p sigmoid colectomy INR drop to 1.77. 2.5mg  coumadin given last night. No further notes of coffee ground emesis. H/H stable. PTA pt on coumadin 2.5mg  on Thursday and 5mg  all other days  Goal of Therapy:  INR 2-3   Plan:  1. Coumadin 5mg  po x 1 at 1800 2. F/u daily pt/inr   Thank you,  Brett Fairy, PharmD Pager: [phone]  04/12/2011 11:13 AM   Allergies  Allergen Reactions  . Lasix (Furosemide) Swelling  . Penicillins Swelling    Mouth swelling and redness  . Sulfa Antibiotics Rash    Patient Measurements: Height: 5\' 8"  (172.7 cm) Weight: 186 lb 1.1 oz (84.4 kg) (scale (A)) IBW/kg (Calculated) : 63.9   Vital Signs: Temp: 97.5 F (36.4 C) (01/27 0438) Temp src: Oral (01/27 0438) BP: 132/72 mmHg (01/27 0438) Pulse Rate: 60  (01/27 0438)  Labs:  Basename 04/12/11 0842 04/11/11 0500 04/10/11 0537 04/09/11 1427  HGB 10.8* 9.6* -- --  HCT 35.0* 32.0* 33.5* --  PLT 355 301 304 --  APTT -- -- -- --  LABPROT 20.9* 25.4* 25.8* --  INR 1.77* 2.27* 2.31* --  HEPARINUNFRC -- -- 0.29* 0.40  CREATININE -- -- -- --  CKTOTAL -- -- -- --  CKMB -- -- -- --  TROPONINI -- -- -- --   Estimated Creatinine Clearance: 57.5 ml/min (by C-G formula based on Cr of 0.7).  Medications:  Scheduled:    . diltiazem  240 mg Oral Daily  . famotidine  20 mg Oral BID  . feeding supplement  1 Container Oral TID WC  . lisinopril  10 mg Oral Daily  . sotalol  160 mg Oral BID  . warfarin  2.5 mg Oral ONCE-1800  . DISCONTD: diltiazem  60 mg Oral Q6H   Infusions:   PRN: sodium chloride, acetaminophen, alum & mag hydroxide-simeth, diphenhydrAMINE, diphenhydrAMINE, morphine injection, naloxone, ondansetron (ZOFRAN) IV, ondansetron (ZOFRAN) IV, ondansetron, oxyCODONE-acetaminophen, sodium chloride, traMADol   [name],  [name] 04/12/2011,11:11 AM

## 2011-04-12 NOTE — Progress Notes (Signed)
Subjective:  78 year old with pacemaker afib recent RVR in the setting of colectomy.  Feels better. No SOB, no CP.  Ate solids yesterday. No BM yet.    Objective:  Vital Signs in the last 24 hours: Temp:  [97.5 F (36.4 C)-97.9 F (36.6 C)] 97.5 F (36.4 C) (01/27 0438) Pulse Rate:  [59-60] 60  (01/27 0438) Resp:  [19-20] 20  (01/27 0438) BP: (124-148)/(56-81) 132/72 mmHg (01/27 0438) SpO2:  [93 %-100 %] 93 % (01/27 0438) Weight:  [84.4 kg (186 lb 1.1 oz)] 84.4 kg (186 lb 1.1 oz) (01/27 0438)  Intake/Output from previous day: 01/26 0701 - 01/27 0700 In: 1116.7 [P.O.:900; I.V.:216.7] Out: 1800 [Urine:1800]   Physical Exam: General: Well developed, well nourished, in no acute distress. Head:  Normocephalic and atraumatic. Lungs: Clear to auscultation and percussion. Heart: Normal S1 and S2.  No murmur, rubs or gallops.  Pulses: Pulses normal in all 4 extremities. Abdomen: soft, non-tender, positive bowel sounds. Extremities: No clubbing or cyanosis. No edema. Neurologic: Alert and oriented x 3.    Lab Results:  Basename 04/11/11 0500 04/10/11 0537  WBC 8.1 9.0  HGB 9.6* 10.2*  PLT 301 304    Imaging: Dg Abd 2 Views  04/10/2011  *RADIOLOGY REPORT*  Clinical Data: Postop, question ileus  ABDOMEN - 2 VIEW  Comparison: 03/31/2011  Findings: Midline lower abdomen and pelvis skin staples are noted. Surgical clips are noted within pelvis.  Moderate gaseous distended small bowel loops throughout the abdomen with air-fluid levels suspicious for ileus or partial bowel obstruction. No free abdominal air.  IMPRESSION:  Surgical clips are noted within pelvis.  Moderate gaseous distended small bowel loops throughout the abdomen with air-fluid levels suspicious for ileus or partial bowel obstruction.  Original Report Authenticated By: Natasha Mead, M.D.     Telemetry: A paced, intrinsic V. No RVR Personally viewed.   Cardiac Studies:  ECHO - Left ventricle: The cavity size was normal.  There was moderate concentric hypertrophy. Systolic function was normal. The estimated ejection fraction was in the range of 55% to 60%. Wall motion was normal; there were no regional wall motion abnormalities. - Mitral valve: Mild regurgitation. - Left atrium: The atrium was moderately dilated. - Atrial septum: No defect or patent foramen ovale was identified. - Pulmonary arteries: PA peak pressure: 40mm Hg (S).    Assessment/Plan:  Principal Problem:  *Atrial flutter with rapid ventricular response Active Problems:  Hypertension  Diastolic CHF, acute on chronic  Anemia  Cancer of sigmoid colon s/p sigmoid colectomy for T3N0 lesion on 1/18  Improved. No further RVR. Will consolidate diltiazem to 240CD once a day. Continue sotalol. Pacer for backup.   Diastolic HF - improved. No SOB. EF normal.   Await BM - prior ileus.    [name] 04/12/2011, 7:50 AM

## 2011-04-12 NOTE — Progress Notes (Signed)
9 Days Post-Op  Subjective: Patient feeling well and tolerating regular diet. Having bowel movements. No pain. No nausea.  She is hoping to go to assisted-living in [facility] tomorrow. She spoke with social work last Thursday.  Objective: Vital signs in last 24 hours: Temp:  [97.5 F (36.4 C)-97.9 F (36.6 C)] 97.5 F (36.4 C) (01/27 0438) Pulse Rate:  [59-60] 60  (01/27 0438) Resp:  [19-20] 20  (01/27 0438) BP: (124-148)/(56-81) 132/72 mmHg (01/27 0438) SpO2:  [93 %-100 %] 93 % (01/27 0438) Weight:  [186 lb 1.1 oz (84.4 kg)] 186 lb 1.1 oz (84.4 kg) (01/27 0438) Last BM Date: 04/11/11  Intake/Output from previous day: 01/26 0701 - 01/27 0700 In: 1116.7 [P.O.:900; I.V.:216.7] Out: 1800 [Urine:1800] Intake/Output this shift: Total I/O In: 240 [P.O.:240] Out: -   General appearance: alert GI: abdomen is soft and not tender. Midline incision healing well. Staples in place. No infection. Normal bowel sounds.  Lab Results:  Results for orders placed during the hospital encounter of 03/27/11 (from the past 24 hour(s))  CBC     Status: Abnormal   Collection Time   04/12/11  8:42 AM      Component Value Range   WBC 7.3  4.0 - 10.5 (K/uL)   RBC 4.32  3.87 - 5.11 (MIL/uL)   Hemoglobin 10.8 (*) 12.0 - 15.0 (g/dL)   HCT 40.9 (*) 81.1 - 46.0 (%)   MCV 81.0  78.0 - 100.0 (fL)   MCH 25.0 (*) 26.0 - 34.0 (pg)   MCHC 30.9  30.0 - 36.0 (g/dL)   RDW 91.4 (*) 78.2 - 15.5 (%)   Platelets 355  150 - 400 (K/uL)     Studies/Results: @RISRSLT24 @     . diltiazem  240 mg Oral Daily  . famotidine  20 mg Oral BID  . feeding supplement  1 Container Oral TID WC  . lisinopril  10 mg Oral Daily  . sotalol  160 mg Oral BID  . warfarin  2.5 mg Oral ONCE-1800  . DISCONTD: diltiazem  60 mg Oral Q6H     Assessment/Plan: s/p Procedure(s): COLON RESECTION LAPAROSCOPy  POD#9  Sigmoid colon cancer, stage T3, N0, status post extensive lysis of adhesions and sigmoid colectomy. Ileus  has resolved. Hopefully she can be discharged to assisted living in [facility] tomorrow.  We will remove her skin staples tomorrow and Steri-Strips the wound.  Atrial flutter with rapid ventricular response,stable on Cardizem, lisinopril, sotalol, and Coumadin. Being managed by Dr. Donato Schultz.  Hypertension, controlled  diastolic congestive heart failure, acute on chronic  Anemia  Chronic anticoagulation, on Coumadin    LOS: 16 days    Meia Emley M. Derrell Lolling, M.D., [facility] General and Minimally invasive Surgery Breast and Colorectal Surgery Office:   [phone] Pager:   [phone]  04/12/2011  . .prob

## 2011-04-13 ENCOUNTER — Inpatient Hospital Stay
Admission: RE | Admit: 2011-04-13 | Discharge: 2011-05-22 | Disposition: A | Discharge status: Home / Self Care | Payer: Self-pay | Source: Ambulatory Visit | Attending: Internal Medicine | Admitting: Internal Medicine

## 2011-04-13 DIAGNOSIS — R140 Abdominal distension (gaseous): Principal | ICD-10-CM

## 2011-04-13 LAB — CBC
MCH: 24.7 pg — ABNORMAL LOW (ref 26.0–34.0)
MCHC: 30.5 g/dL (ref 30.0–36.0)
MCV: 81.1 fL (ref 78.0–100.0)
Platelets: 301 10*3/uL (ref 150–400)
RBC: 3.76 MIL/uL — ABNORMAL LOW (ref 3.87–5.11)

## 2011-04-13 MED ORDER — ACETAMINOPHEN 325 MG PO TABS: 650.0000 mg | ORAL_TABLET | Freq: Four times a day (QID) | ORAL | Status: DC | PRN

## 2011-04-13 MED ORDER — OXYCODONE-ACETAMINOPHEN 5-325 MG PO TABS: 1.0000 | ORAL_TABLET | ORAL | Status: AC | PRN

## 2011-04-13 MED ORDER — WARFARIN SODIUM 5 MG PO TABS: 2.5000 mg | ORAL_TABLET | ORAL | Status: DC

## 2011-04-13 MED ORDER — DILTIAZEM HCL ER COATED BEADS 240 MG PO CP24: 240.0000 mg | ORAL_CAPSULE | Freq: Every day | ORAL | Status: DC

## 2011-04-13 MED ORDER — WARFARIN SODIUM 5 MG PO TABS
5.0000 mg | ORAL_TABLET | Freq: Every day | ORAL | Status: DC
  Administered 2011-04-13: 5 mg via ORAL
  Filled 2011-04-13: qty 1

## 2011-04-13 MED ORDER — POLYETHYLENE GLYCOL 3350 17 G PO PACK
17.0000 g | PACK | Freq: Every day | ORAL | Status: DC | PRN
  Filled 2011-04-13: qty 1

## 2011-04-13 MED ORDER — ENSURE IMMUNE HEALTH PO LIQD: 237.0000 mL | Freq: Three times a day (TID) | ORAL | Status: DC

## 2011-04-13 MED ORDER — SOTALOL HCL 160 MG PO TABS: 160.0000 mg | ORAL_TABLET | Freq: Two times a day (BID) | ORAL | Status: DC

## 2011-04-13 MED ORDER — FAMOTIDINE 20 MG PO TABS: 20.0000 mg | ORAL_TABLET | Freq: Two times a day (BID) | ORAL | Status: DC

## 2011-04-13 MED ORDER — ENSURE IMMUNE HEALTH PO LIQD
237.0000 mL | Freq: Three times a day (TID) | ORAL | Status: DC
  Administered 2011-04-13: 237 mL via ORAL

## 2011-04-13 MED ORDER — POLYETHYLENE GLYCOL 3350 17 G PO PACK: 17.0000 g | PACK | Freq: Every day | ORAL | Status: AC | PRN

## 2011-04-13 NOTE — Discharge Summary (Signed)
Physician Discharge Summary  Patient ID: [name] MRN: [id number] DOB/AGE: 08/29/1922 78 y.o.  Admit date: 03/27/2011 Discharge date: 04/13/2011 Primary Care: Dr.Don Christell [name]  Cardiology: DR. Philis [name]   Admission Diagnoses: PAF, 2:1 Block with RVR, SOB. New anemia. Chronic Anticoagulation for Afib; Tachy-brady syndrome with PTVP. Acute on chronic DCHF HX. 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  Consults: Dr. Dulce [name] [facility] Course: Patient is an 78 year old female who was admitted on 03/26/2010 with shortness of breath weakness, CHF, atrial flutter with RVR. She was placed on a Cardizem drip and admitted for further treatment. She's placed on heparin and her Coumadin was subtherapeutic. She was also found to have some anemia. She was seen on 03/28/2011 by Dr. Dulce [name], for her anemia to he subsequently scheduled her for colonoscopy and endoscopy she has no prior history of colonoscopy. Stool guaiac was negative. EGD was essentially benign and showed a hiatal hernia. Colonoscopy showed a circumferential sigmoid mass pathology showed a be an adenocarcinoma of the sigmoid colon.   On 03/29/2010 she was seen in consultation by Dr. Gaynelle [name] for possible resection of her colon cancer. Cardiology workup was completed she was taken off Coumadin and her heparin was discontinued echocardiogram was obtained which showed normal left ventricular function. She was then taken  to the operating room on 04/03/2011 underwent diagnostic laparoscopy extensive lysis of adhesions for 2-1/2 hours sigmoid colectomy and rigid proctoscopy. Postoperatively she had expected postoperative ileus. This slowly improved and her diet was advanced to a diet. She had postoperative atrial fibrillation. He was maintained on sotalol and a Cardizem drip. This was weaned off and she was converted to oral Cardizem. PT and OT were both requested. She did review some therapy on some days because she felt so tired. Her incision is healing nicely, she is taking a low residual diet, and has had bowel movements over the weekend without any significant problems. She was restarted on Coumadin, but is currently not quite therapeutic. By the afternoon of 04/13/11 it was Dr. Jacinto [name] opinion she could be transferred to skilled nursing facility for further treatment.. We will remove half of her staples prior to discharge, and she can have the other half removed in the office. She will followup with Dr. Andrey [name] in approximately one week. The patient's Coumadin is normally followed by the physician assistant [name], Dr. Varney [name] office in [place], and we will direct them to recheck coumadin Wed1/30/13. Contact DR. Moore to find out why she was on Rifampin and if she still needs to be on it. Follow up with DR. Varana, as instructed.   Disposition: To SKILLED NURSING FACILITY   Medication List  As of 04/13/2011  2:54 PM   STOP taking these medications         ciprofloxacin 500 MG tablet      lubiprostone 24 MCG capsule      rifampin 300 MG capsule         TAKE these medications         acetaminophen 325 MG tablet   Commonly known as: TYLENOL   Take 2  tablets (650 mg total) by mouth every 6 (six) hours as needed.      calcium-vitamin D 500-200 MG-UNIT per tablet   Commonly known as: OSCAL WITH D   Take 1 tablet by mouth 2 (two) times daily.      diltiazem 240 MG 24 hr capsule   Commonly  known as: CARDIZEM CD   Take 1 capsule (240 mg total) by mouth daily.      famotidine 20 MG tablet   Commonly known as: PEPCID   Take 1 tablet (20 mg total) by mouth 2 (two) times daily.      feeding supplement Liqd   Take 237 mLs by mouth 3 (three) times daily with meals.      oxyCODONE-acetaminophen 5-325 MG per tablet   Commonly known as: PERCOCET   Take 1-2 tablets by mouth every 4 (four) hours as needed.      polyethylene glycol packet   Commonly known as: MIRALAX / GLYCOLAX   Take 17 g by mouth daily as needed.      polysaccharide iron 150 MG Caps capsule   Commonly known as: NIFEREX   Take 150 mg by mouth every morning.      PROLIA 60 MG/ML Soln injection   Generic drug: denosumab   Inject 60 mg into the skin every 6 (six) months.      ramipril 10 MG capsule   Commonly known as: ALTACE   Take 10 mg by mouth daily.      sotalol 160 MG tablet   Commonly known as: BETAPACE   Take 1 tablet (160 mg total) by mouth 2 (two) times daily.      warfarin 5 MG tablet   Commonly known as: COUMADIN   Take 0.5-1 tablets (2.5-5 mg total) by mouth as directed. Take 2.5 mg (half a tablet) on thursdays, all other days take 5 mg (1 tablet) Preadmission dose.   Use the new dose as described below.  This is only for your caretakers history.           Follow-up Information    Follow up with Carla Ina, MD. Schedule an appointment as soon as possible for a visit in 10 days. (Call for any problems with your wound, abdominal pain, trouble voiding,or fever.Schelule appoinment in 7-10 days.)    Contact information:   [employer] [address], [address] [place] [address] [phone]       Schedule an appointment as soon as possible for a visit with [name]., MD.   Contact information:   [address] [place] [address] [phone]       Call Carla Becton, MD. (Obtain a Protime on 04/15/11,  and call results to DR. Moore to adjust coumadin level.)    Contact information:   [address] [place] [address] [phone]       Please follow up. (Obtain a protime on 04/15/11, contact DR. Moore or the facility provider to adjust coumadin dosing.  Contact DR,. Morre to see why she was on Rifampin and if she still needs it.)          Signed: Jese Comella 04/13/2011, 2:54 PM

## 2011-04-13 NOTE — Progress Notes (Signed)
PT Cancellation Note  Treatment cancelled today due to pt being d/c'd to SNF and prefers to "save her energy" for d/c.Marland Kitchen  Newell Coral 04/13/2011, 11:36 AM  Newell Coral, PTA

## 2011-04-13 NOTE — Progress Notes (Signed)
Subjective:  78 year old with pacemaker afib recent RVR in the setting of colectomy.  Feels better. No SOB, no CP.  Ate solids yesterday. Small BM   Objective:  Vital Signs in the last 24 hours: Temp:  [97.4 F (36.3 C)-98 F (36.7 C)] 97.7 F (36.5 C) (01/28 0600) Pulse Rate:  [60-63] 63  (01/28 0600) Resp:  [18] 18  (01/28 0600) BP: (121-143)/(52-70) 140/66 mmHg (01/28 0600) SpO2:  [94 %-99 %] 94 % (01/28 0600) Weight:  [85.3 kg (188 lb 0.8 oz)] 85.3 kg (188 lb 0.8 oz) (01/28 0600)  Intake/Output from previous day: 01/27 0701 - 01/28 0700 In: 1230 [P.O.:1230] Out: 460 [Urine:460]   Physical Exam: General: Well developed, well nourished, in no acute distress. Head:  Normocephalic and atraumatic. Lungs: Clear to auscultation and percussion. Heart: Normal S1 and S2.    Abdomen: soft, non-tender,  Extremities:tr edema. Neurologic: Alert and oriented x 3.    Lab Results:  Basename 04/13/11 0645 04/12/11 0842  WBC 7.5 7.3  HGB 9.3* 10.8*  PLT 301 355    Imaging: No results found.   Telemetry: A paced, intrinsic V. No RVR Personally viewed.   Cardiac Studies:  ECHO - Left ventricle: The cavity size was normal. There was moderate concentric hypertrophy. Systolic function was normal. The estimated ejection fraction was in the range of 55% to 60%. Wall motion was normal; there were no regional wall motion abnormalities. - Mitral valve: Mild regurgitation. - Left atrium: The atrium was moderately dilated. - Atrial septum: No defect or patent foramen ovale was identified. - Pulmonary arteries: PA peak pressure: 40mm Hg (S).    Assessment/Plan:  Principal Problem:  *Atrial flutter with rapid ventricular response Active Problems:  Hypertension  Diastolic CHF, acute on chronic  Anemia  Cancer of sigmoid colon s/p sigmoid colectomy for T3N0 lesion on 1/18  Improved. No further RVR. Tolerating diltiazem  240CD once a day. Continue sotalol. Pacer for backup.    Diastolic HF - improved. No SOB. EF normal.    ileus.   OK to d/c from cardiac standpoint.  Needs f/u of her coumadin dosing which she has normally from her primary care MD. INR subtherapeutic today.  Would check with pharmacy  As to coumadin dosing at discharge and when f/u INR is needed.  May get closer to regular dosing after her appetite returns to normal.  F/U with cardiology already scheduled in February.   Jaksen Fiorella S. 04/13/2011, 9:10 AM

## 2011-04-13 NOTE — Progress Notes (Signed)
Nutrition Follow-up  Diet Order:  Low Residue PO intake- 50-100%, improving with diet advance. Patient reports she was just very tired of the full liquid meals. Patient reports being "turned off" to the [employer] after an episode of vomiting, but is tolerating Ensure well.   Meds: Scheduled Meds:   . diltiazem  240 mg Oral Daily  . famotidine  20 mg Oral BID  . feeding supplement  1 Container Oral TID WC  . lisinopril  10 mg Oral Daily  . sotalol  160 mg Oral BID  . warfarin  5 mg Oral ONCE-1800   Continuous Infusions:  PRN Meds:.sodium chloride, acetaminophen, alum & mag hydroxide-simeth, diphenhydrAMINE, diphenhydrAMINE, morphine injection, naloxone, ondansetron (ZOFRAN) IV, ondansetron (ZOFRAN) IV, ondansetron, oxyCODONE-acetaminophen, sodium chloride, traMADol  Labs:  CMP     Component Value Date/Time   NA 141 04/06/2011 0500   K 4.0 04/06/2011 0500   CL 109 04/06/2011 0500   CO2 26 04/06/2011 0500   GLUCOSE 102* 04/06/2011 0500   BUN 10 04/06/2011 0500   CREATININE 0.70 04/06/2011 0500   CALCIUM 7.8* 04/06/2011 0500   PROT 6.6 04/01/2011 0754   ALBUMIN 3.1* 04/01/2011 0754   AST 31 04/01/2011 0754   ALT 19 04/01/2011 0754   ALKPHOS 50 04/01/2011 0754   BILITOT 0.2* 04/01/2011 0754   GFRNONAA 76* 04/06/2011 0500   GFRAA 88* 04/06/2011 0500     Intake/Output Summary (Last 24 hours) at 04/13/11 1029 Last data filed at 04/13/11 9147  Gross per 24 hour  Intake   1230 ml  Output    460 ml  Net    770 ml    Weight Status:  Has been variable, but overall stable wit admission weight  Re-estimated needs:  Remains the same, 1500-1700 kcal, 90-100 gm protein.   Nutrition Dx:  Inadequate oral intake improving  Goal:  PO intake of meals and supplements to meet >/=90% of estimated nutrition needs, likely being met  Intervention:   1. Will D/C [employer] and add Ensure shake TID with meals 2. RD will continue to follow  Monitor:  PO intake, diet tolerance, weight,  labs   Rudean Haskell Pager #:  [phone]

## 2011-04-13 NOTE — Progress Notes (Signed)
Patient interviewed and examined. I agree with Mr. Marlyne Beards none.  I think that it is safe for her to go home. She will followup with [facility] cardiology in [place] this week for a Coumadin and INR check which has been her pattern in the past. She will follow with Dr. Gaynelle Adu next week for a postop check and he will make a referral to medical oncology. I doubt that she will be a candidate for chemotherapy, however.   Angelia Mould. Derrell Lolling, M.D., [facility] General and Minimally invasive Surgery Breast and Colorectal Surgery Office:   [phone] Pager:   [phone]

## 2011-04-13 NOTE — Progress Notes (Signed)
Removed every other staple from Mid line ABD incision as per ordered. Incision approximated scant amt of sero-sang drainage noted at bottom of incision  Redressed with clean ABD pad. Pt tolerated procedure well. Marisa Cyphers RN

## 2011-04-13 NOTE — Progress Notes (Signed)
Clinical Social Worker submitted appropriate paperwork to SNF and arranged transport to SNF.  CSW signing off at dc.  Angelia Mould, MSW, [place] [phone]

## 2011-04-13 NOTE — Progress Notes (Signed)
ANTICOAGULATION CONSULT NOTE - Follow Up Consult  Pharmacy Consult for Coumadin Indication: atrial fibrillation  Assessment: 78 year old female on Coumadin PTA for AFib, now s/p sigmoid colectomy. INR is subtherapeutic today.   Home dosing: Coumadin 5 mg daily except 2.5 mg Thurs. Patient was also on rifampin at the time which typically induces the metabolism of Coumadin and requires higher doses to compensate.  Goal of Therapy:  INR 2-3   Plan:  1. Coumadin 5 mg po daily 2. F/u daily INR 3. Would recommend Coumadin 5 mg po daily if discharged today and check INR Thurs or Fri. Will likely need less coumadin long-term since rifampin therapy completed.   Allergies  Allergen Reactions  . Lasix (Furosemide) Swelling  . Penicillins Swelling    Mouth swelling and redness  . Sulfa Antibiotics Rash    Patient Measurements: Height: 5\' 8"  (172.7 cm) Weight: 188 lb 0.8 oz (85.3 kg) IBW/kg (Calculated) : 63.9   Vital Signs: Temp: 97.7 F (36.5 C) (01/28 0600) Temp src: Oral (01/28 0600) BP: 140/66 mmHg (01/28 0600) Pulse Rate: 63  (01/28 0600)  Labs:  Basename 04/13/11 0645 04/12/11 0842 04/11/11 0500  HGB 9.3* 10.8* --  HCT 30.5* 35.0* 32.0*  PLT 301 355 301  APTT -- -- --  LABPROT 20.3* 20.9* 25.4*  INR 1.70* 1.77* 2.27*  HEPARINUNFRC -- -- --  CREATININE -- -- --  CKTOTAL -- -- --  CKMB -- -- --  TROPONINI -- -- --   Estimated Creatinine Clearance: 57.8 ml/min (by C-G formula based on Cr of 0.7).  Medications:  Scheduled:     . diltiazem  240 mg Oral Daily  . famotidine  20 mg Oral BID  . feeding supplement  1 Container Oral TID WC  . lisinopril  10 mg Oral Daily  . sotalol  160 mg Oral BID  . warfarin  5 mg Oral ONCE-1800   Infusions:   PRN: sodium chloride, acetaminophen, alum & mag hydroxide-simeth, diphenhydrAMINE, diphenhydrAMINE, morphine injection, naloxone, ondansetron (ZOFRAN) IV, ondansetron (ZOFRAN) IV, ondansetron, oxyCODONE-acetaminophen, sodium  chloride, traMADol   Wise, Darshawn Boateng Danielle 04/13/2011,10:16 AM

## 2011-04-13 NOTE — Progress Notes (Signed)
Pt discharged per ambulance to [facility]. Pt has all belongings & information packet given to crew. Phone call to family to let them know when ambulance and pt left the floor. Marisa Cyphers RN

## 2011-04-13 NOTE — Progress Notes (Signed)
10 Days Post-Op  Subjective: Telem : mostly pacing in 60's. Up in chair eating.  BM on Friday, again yesterday and a small amount this AM.  Having allot of flatus.   She fells she is moving well, but still using assistance.     Objective: Vital signs in last 24 hours: Temp:  [97.4 F (36.3 C)-98 F (36.7 C)] 97.7 F (36.5 C) (01/28 0600) Pulse Rate:  [60-63] 63  (01/28 0600) Resp:  [18] 18  (01/28 0600) BP: (121-143)/(52-70) 140/66 mmHg (01/28 0600) SpO2:  [94 %-99 %] 94 % (01/28 0600) Weight:  [85.3 kg (188 lb 0.8 oz)] 85.3 kg (188 lb 0.8 oz) (01/28 0600) Last BM Date: 04/11/11  Intake/Output from previous day: 01/27 0701 - 01/28 0700 In: 1230 [P.O.:1230] Out: 460 [Urine:460] Intake/Output this shift:    PE: Alert seems to be doing well. Chest, few rales at base, but overall sounds clear. Abd-Soft, nontender, +BS, +flatus,+Bm, Incision cleaned and redressed. Lab Results:   Basename 04/13/11 0645 04/12/11 0842  WBC 7.5 7.3  HGB 9.3* 10.8*  HCT 30.5* 35.0*  PLT 301 355   BMET No results found for this basename: NA:2,K:2,CL:2,CO2:2,GLUCOSE:2,BUN:2,CREATININE:2,CALCIUM:2 in the last 72 hours PT/INR  Basename 04/13/11 0645 04/12/11 0842  LABPROT 20.3* 20.9*  INR 1.70* 1.77*   Comprehensive Metabolic Panel:    Component Value Date/Time   NA 141 04/06/2011 0500   K 4.0 04/06/2011 0500   CL 109 04/06/2011 0500   CO2 26 04/06/2011 0500   BUN 10 04/06/2011 0500   CREATININE 0.70 04/06/2011 0500   GLUCOSE 102* 04/06/2011 0500   CALCIUM 7.8* 04/06/2011 0500   AST 31 04/01/2011 0754   ALT 19 04/01/2011 0754   ALKPHOS 50 04/01/2011 0754   BILITOT 0.2* 04/01/2011 0754   PROT 6.6 04/01/2011 0754   ALBUMIN 3.1* 04/01/2011 0754     Studies/Results: No results found.  Anti-infectives: Anti-infectives     Start     Dose/Rate Route Frequency Ordered Stop   04/03/11 1800   ciprofloxacin (CIPRO) IVPB 400 mg        400 mg 200 mL/hr over 60 Minutes Intravenous Every 12 hours  04/03/11 1649 04/03/11 2041   04/03/11 1800   metroNIDAZOLE (FLAGYL) IVPB 500 mg        500 mg 100 mL/hr over 60 Minutes Intravenous Every 8 hours 04/03/11 1649 04/03/11 1854   04/03/11 0815   ciprofloxacin (CIPRO) IVPB 400 mg        400 mg 200 mL/hr over 60 Minutes Intravenous To Surgery 04/03/11 0810 04/03/11 0825   04/03/11 0815   metroNIDAZOLE (FLAGYL) IVPB 500 mg        500 mg 100 mL/hr over 60 Minutes Intravenous To Surgery 04/03/11 0810 04/03/11 0842   04/01/11 1921   ertapenem (INVANZ) 1 g in sodium chloride 0.9 % 50 mL IVPB  Status:  Discontinued        1 g 100 mL/hr over 30 Minutes Intravenous 60 min pre-op 04/01/11 1921 04/03/11 1610          Assessment Principal Problem:  *Atrial flutter with rapid ventricular response-better on Cardizem PO now  Active Problems:  Hypertension  Diastolic CHF, acute on chronic  Anemia  Cancer of sigmoid colon s/p sigmoid colectomy for T3N0 lesion on 1/18-has developed a mild ileus; may be related to her Cardizem drip Coumadin is not therapeutic.  Followed by "Gennette Pac, PA Dr. Roe Coombs Moore's office in [place]."  Plan:  Over all she is making  good progress.  I will check with PT, Cardiology, and Case Manager about when patient is ready to go.  Staples still in POD#10  Will take out every other one today .  Looking at Medicine list today it looks like she's not on coumadin. She is on coumadin, but it comes off list each night because it is not a continuous daily order.  I'm not sure why.  Will investigate and address.    LOS: 17 days   04/13/2011

## 2011-04-14 NOTE — Discharge Summary (Signed)
Reviewed

## 2011-04-19 ENCOUNTER — Encounter (HOSPITAL_COMMUNITY): Payer: Self-pay

## 2011-04-20 ENCOUNTER — Telehealth (INDEPENDENT_AMBULATORY_CARE_PROVIDER_SITE_OTHER): Payer: Self-pay

## 2011-04-20 NOTE — Telephone Encounter (Signed)
Drinda Butts at [facility] called with update on pt. She states pt did have nausea over weekend but was placed by PCP in clear liquid diet and this has resolved. Pt has had some loose stools and PCP gave orders for stool culture. This was done today and should be available for pts ov here by 2-7. Drinda Butts states pt stating she feels much better today. Pts vitals are normal and wound looks good. Drinda Butts advised to please let pts son know how pt is doing and to call our office if pt has vomiting or any other symptoms of concern. She states she will.

## 2011-04-20 NOTE — Telephone Encounter (Signed)
Pt's son called concerned about pt's nausea that she is having on a daily basis. He states pt is eating.  Pt son  states pt is in skilled care/ rehab center in [place]. I explained that he should have the nurse there access her and call our office with any concerns. Pt's son advised we will be glad to assist with pt care but we depend on the nurses and the MD at the care facility to access and give [place] status on pt so we can make recommendations. He states he will have the nurses and MD there eval these symptoms and call us with any concerns. He is also advised to call back if he has concerns.

## 2011-04-22 ENCOUNTER — Ambulatory Visit (HOSPITAL_COMMUNITY): Payer: Medicare Other | Attending: Internal Medicine

## 2011-04-22 DIAGNOSIS — R142 Eructation: Secondary | ICD-10-CM | POA: Insufficient documentation

## 2011-04-22 DIAGNOSIS — R109 Unspecified abdominal pain: Secondary | ICD-10-CM | POA: Insufficient documentation

## 2011-04-22 DIAGNOSIS — R141 Gas pain: Secondary | ICD-10-CM | POA: Insufficient documentation

## 2011-04-22 DIAGNOSIS — R143 Flatulence: Secondary | ICD-10-CM | POA: Insufficient documentation

## 2011-04-22 DIAGNOSIS — Z9889 Other specified postprocedural states: Secondary | ICD-10-CM | POA: Insufficient documentation

## 2011-04-23 ENCOUNTER — Encounter (INDEPENDENT_AMBULATORY_CARE_PROVIDER_SITE_OTHER): Payer: Medicare Other | Admitting: General Surgery

## 2011-04-23 ENCOUNTER — Telehealth (INDEPENDENT_AMBULATORY_CARE_PROVIDER_SITE_OTHER): Payer: Self-pay

## 2011-04-23 NOTE — Telephone Encounter (Signed)
Gave verbal order for staple removal, patient not able to come to follow up appointment w/Dr. Biagio Quint (Dr. [name] on vac)  Per Dr. Biagio Quint okay for RN at facility to remove staples and apply steri-strips to her incision.  (s/p lap colon resection 04/03/11)

## 2011-04-27 ENCOUNTER — Telehealth (INDEPENDENT_AMBULATORY_CARE_PROVIDER_SITE_OTHER): Payer: Self-pay

## 2011-04-27 NOTE — Telephone Encounter (Signed)
Reviewed records. Labs are ok except for supratherapeutic INR probably secondary to flagyl. But pt is Cdiff positive. Unable to reach pt's son or Dr Leanord Hawking. Will try to move pt's appt to Tuesday with me

## 2011-04-27 NOTE — Telephone Encounter (Signed)
Patients son called with concerns about [name]. She has had N/V for a week now. When N/V started last week test done and patient found to have C Diff. Put on flagyl by Dr Leanord Hawking but symptoms remained. Then patient switched to something else (son didn't know) due to trouble with coumadin. Patient had x ray and thinks to be negative. Since son was giving me pieces of information I called Robbin that works with Dr. Leanord Hawking and she is faxing over notes for Dr. [name] to review. Patients son very concerned with mothers symptoms since  She has been vomiting for a week now and isn't getting better. She has a follow up appointment Wednesday with Biagio Quint. I will give notes to [name] to review.

## 2011-04-28 ENCOUNTER — Encounter (INDEPENDENT_AMBULATORY_CARE_PROVIDER_SITE_OTHER): Payer: Self-pay | Admitting: General Surgery

## 2011-04-28 NOTE — Telephone Encounter (Addendum)
Called and left message with appt coordinator at [facility] to try and get patient in office today. Left another message with coordinator this am.

## 2011-04-28 NOTE — Telephone Encounter (Signed)
Spoke with Robin @ [facility] [phone] and made her aware Dr [name] could see patient this afternoon @ 2 pm. She contacted son who had taken patient to a cardiology appt and was able to reach them by calling the cardiologist. I called son @ home number and cell phone, no answer and no way to leave message. I also tried to contact other son @ his cell number with no answer. Dr [name] called and he is unable to make it to the office today to see the patient. I called Robin back at [facility] and made her aware I could not contact them and she had no additional numbers for me to try and contact them.

## 2011-04-28 NOTE — Telephone Encounter (Signed)
This encounter was created in error - please disregard.

## 2011-04-28 NOTE — Telephone Encounter (Signed)
Patient showed up. Made them aware Dr [name] was not available. Made appt for her to see Dr [name] tomorrow.

## 2011-04-29 ENCOUNTER — Encounter (INDEPENDENT_AMBULATORY_CARE_PROVIDER_SITE_OTHER): Payer: Medicare Other | Admitting: General Surgery

## 2011-04-29 ENCOUNTER — Ambulatory Visit (INDEPENDENT_AMBULATORY_CARE_PROVIDER_SITE_OTHER): Payer: Medicare Other | Admitting: General Surgery

## 2011-04-29 ENCOUNTER — Telehealth (INDEPENDENT_AMBULATORY_CARE_PROVIDER_SITE_OTHER): Payer: Self-pay

## 2011-04-29 ENCOUNTER — Encounter (INDEPENDENT_AMBULATORY_CARE_PROVIDER_SITE_OTHER): Payer: Self-pay | Admitting: General Surgery

## 2011-04-29 VITALS — BP 102/68 | HR 64 | Temp 98.4°F | Resp 16 | Ht 65.0 in | Wt 167.4 lb

## 2011-04-29 DIAGNOSIS — Z09 Encounter for follow-up examination after completed treatment for conditions other than malignant neoplasm: Secondary | ICD-10-CM

## 2011-04-29 NOTE — Progress Notes (Signed)
Chief complaint: Postop  Procedure: Status post open sigmoid colectomy, extensive lysis of adhesions T3 N0 sigmoid colon cancer Hospitalization from January 11 through January 28  History of Present Ilness: 78 year old Caucasian female comes in today for her first postoperative appointment. She is accompanied by her son. She is currently recuperating in a skilled nursing facility. Her postoperative course at skilled nursing facility has been complicated by C. difficile colitis. She had some persistent diarrhea and her stool was cultured which revealed C. difficile. She was initially placed on oral Flagyl. As expected, her INR became supratherapeutic. She never developed a leukocytosis or abdominal pain per se. Her most recent issue has been persistent nausea and vomiting. She denies any fevers or chills. She denies any abdominal pain. She states that she is having daily bowel movements. She states they are normal. She states that she has not had any vomiting for day and a half now. She states that her energy level is not what it was before surgery. She states that they gave her all her morning pills at once. And she believes this is contributing to her nausea. She states that she likes to drink a chocolate Boost  Physical Exam: BP 102/68  Pulse 64  Temp(Src) 98.4 F (36.9 C) (Temporal)  Resp 16  Ht 5\' 5"  (1.651 m)  Wt 167 lb 6.4 oz (75.932 kg)  BMI 27.86 kg/m2  Gen: alert, NAD, non-toxic appearing, sitting in wheelchair Pupils: equal, no scleral icterus Pulm: Lungs clear to auscultation, symmetric chest rise CV: irregular rate and rhythm Abd: soft, nontender, nondistended. Well-healed incision. No cellulitis. No incisional hernia Ext: no edema, no calf tenderness Skin: no rash, no jaundice   Pathology: T3 N0 sigmoid colon cancer  Assessment and Plan: Status post sigmoid colectomy and extensive lysis of adhesions for T3 N0 sigmoid colon cancer C. difficile colitis  I reviewed her  labs that she's had over the past week as well as her abdominal plain x-rays. Her labs are essentially normal except for supratherapeutic INR. She was switched to oral vancomycin for C. difficile.  Overall I think she is doing as expected. It is reassuring she has not had any vomiting for day and a half. Her abdomen is nice and soft and benign. I do not see a need for a CT scan of her abdomen and pelvis at this time.  She was presented at the GI tumor board conference and no additional treatment was recommended for her colon cancer.  I recommended to the skilled nursing facility that may allow the patient to try her breakfast first followed by her morning pills. I encouraged her to drink 2-3 cans of boost a day.   I also recommended that they repeat a C. difficile stool assay once she has received adequate treatment to confirm adequacy of treatment.  Followup 4 weeks  Yesennia Sella. [name], MD, FACS General, Bariatric, & Minimally Invasive Surgery [facility], [place]

## 2011-04-29 NOTE — Patient Instructions (Signed)
See consultation report  Call if nausea/vomiting returns

## 2011-04-29 NOTE — Telephone Encounter (Signed)
Rose called and stated that Dr [name] wrote to consider ordering Zofran routinely.  They need clarification of the order.  They can write for it if he gives the order for (Zofran 4mg  tid before meals).  You can fax the order to [phone].

## 2011-04-30 NOTE — Telephone Encounter (Signed)
Faxed order for zofran to number provided.

## 2011-04-30 NOTE — Telephone Encounter (Signed)
Pls order zofran 4mg  po/sublingual tid before meals x 1week

## 2011-05-04 ENCOUNTER — Encounter (INDEPENDENT_AMBULATORY_CARE_PROVIDER_SITE_OTHER): Payer: Self-pay

## 2011-06-02 ENCOUNTER — Encounter (HOSPITAL_COMMUNITY): Payer: Self-pay

## 2011-06-02 ENCOUNTER — Other Ambulatory Visit: Payer: Self-pay

## 2011-06-02 ENCOUNTER — Inpatient Hospital Stay (HOSPITAL_COMMUNITY)
Admission: EM | Admit: 2011-06-02 | Discharge: 2011-06-10 | DRG: 292 | Disposition: A | Discharge status: Home Health | Payer: Medicare Other | Attending: Internal Medicine | Admitting: Internal Medicine

## 2011-06-02 ENCOUNTER — Emergency Department (HOSPITAL_COMMUNITY): Payer: Medicare Other

## 2011-06-02 DIAGNOSIS — I10 Essential (primary) hypertension: Secondary | ICD-10-CM | POA: Diagnosis present

## 2011-06-02 DIAGNOSIS — Z7901 Long term (current) use of anticoagulants: Secondary | ICD-10-CM

## 2011-06-02 DIAGNOSIS — I059 Rheumatic mitral valve disease, unspecified: Secondary | ICD-10-CM | POA: Diagnosis present

## 2011-06-02 DIAGNOSIS — J189 Pneumonia, unspecified organism: Secondary | ICD-10-CM

## 2011-06-02 DIAGNOSIS — Z950 Presence of cardiac pacemaker: Secondary | ICD-10-CM | POA: Diagnosis present

## 2011-06-02 DIAGNOSIS — Z880 Allergy status to penicillin: Secondary | ICD-10-CM

## 2011-06-02 DIAGNOSIS — R0902 Hypoxemia: Secondary | ICD-10-CM

## 2011-06-02 DIAGNOSIS — R0600 Dyspnea, unspecified: Secondary | ICD-10-CM

## 2011-06-02 DIAGNOSIS — D509 Iron deficiency anemia, unspecified: Secondary | ICD-10-CM | POA: Diagnosis present

## 2011-06-02 DIAGNOSIS — I4891 Unspecified atrial fibrillation: Secondary | ICD-10-CM | POA: Diagnosis present

## 2011-06-02 DIAGNOSIS — Z6828 Body mass index (BMI) 28.0-28.9, adult: Secondary | ICD-10-CM

## 2011-06-02 DIAGNOSIS — Z66 Do not resuscitate: Secondary | ICD-10-CM | POA: Diagnosis present

## 2011-06-02 DIAGNOSIS — C187 Malignant neoplasm of sigmoid colon: Secondary | ICD-10-CM | POA: Diagnosis present

## 2011-06-02 DIAGNOSIS — I4892 Unspecified atrial flutter: Secondary | ICD-10-CM | POA: Insufficient documentation

## 2011-06-02 DIAGNOSIS — C569 Malignant neoplasm of unspecified ovary: Secondary | ICD-10-CM

## 2011-06-02 DIAGNOSIS — M129 Arthropathy, unspecified: Secondary | ICD-10-CM | POA: Diagnosis present

## 2011-06-02 DIAGNOSIS — K589 Irritable bowel syndrome without diarrhea: Secondary | ICD-10-CM

## 2011-06-02 DIAGNOSIS — J209 Acute bronchitis, unspecified: Secondary | ICD-10-CM | POA: Diagnosis present

## 2011-06-02 DIAGNOSIS — I5181 Takotsubo syndrome: Secondary | ICD-10-CM | POA: Diagnosis present

## 2011-06-02 DIAGNOSIS — I509 Heart failure, unspecified: Secondary | ICD-10-CM

## 2011-06-02 DIAGNOSIS — I495 Sick sinus syndrome: Secondary | ICD-10-CM | POA: Diagnosis present

## 2011-06-02 DIAGNOSIS — Z8543 Personal history of malignant neoplasm of ovary: Secondary | ICD-10-CM

## 2011-06-02 DIAGNOSIS — D649 Anemia, unspecified: Secondary | ICD-10-CM | POA: Diagnosis present

## 2011-06-02 DIAGNOSIS — R609 Edema, unspecified: Secondary | ICD-10-CM

## 2011-06-02 DIAGNOSIS — Z882 Allergy status to sulfonamides status: Secondary | ICD-10-CM

## 2011-06-02 DIAGNOSIS — Z79899 Other long term (current) drug therapy: Secondary | ICD-10-CM

## 2011-06-02 DIAGNOSIS — Z888 Allergy status to other drugs, medicaments and biological substances status: Secondary | ICD-10-CM

## 2011-06-02 DIAGNOSIS — I5033 Acute on chronic diastolic (congestive) heart failure: Principal | ICD-10-CM | POA: Diagnosis present

## 2011-06-02 HISTORY — DX: Unspecified osteoarthritis, unspecified site: M19.90

## 2011-06-02 HISTORY — DX: Anemia, unspecified: D64.9

## 2011-06-02 HISTORY — DX: Presence of cardiac pacemaker: Z95.0

## 2011-06-02 HISTORY — DX: Malignant neoplasm of colon, unspecified: C18.9

## 2011-06-02 LAB — URINALYSIS, ROUTINE W REFLEX MICROSCOPIC
Glucose, UA: NEGATIVE mg/dL
Ketones, ur: NEGATIVE mg/dL
Leukocytes, UA: NEGATIVE
Protein, ur: 30 mg/dL — AB
Urobilinogen, UA: 1 mg/dL (ref 0.0–1.0)

## 2011-06-02 LAB — BASIC METABOLIC PANEL
CO2: 27 mEq/L (ref 19–32)
Chloride: 102 mEq/L (ref 96–112)
Creatinine, Ser: 0.71 mg/dL (ref 0.50–1.10)
GFR calc Af Amer: 88 mL/min — ABNORMAL LOW (ref >90)
Potassium: 4.2 mEq/L (ref 3.5–5.1)
Sodium: 137 mEq/L (ref 135–145)

## 2011-06-02 LAB — RETICULOCYTES
RBC.: 3.59 MIL/uL — ABNORMAL LOW (ref 3.87–5.11)
Retic Count, Absolute: 68.2 10*3/uL (ref 19.0–186.0)
Retic Ct Pct: 1.9 % (ref 0.4–3.1)

## 2011-06-02 LAB — EXPECTORATED SPUTUM ASSESSMENT W GRAM STAIN, RFLX TO RESP C

## 2011-06-02 LAB — CBC
HCT: 28 % — ABNORMAL LOW (ref 36.0–46.0)
MCHC: 30 g/dL (ref 30.0–36.0)
Platelets: 345 10*3/uL (ref 150–400)
RDW: 19.3 % — ABNORMAL HIGH (ref 11.5–15.5)
WBC: 10.3 10*3/uL (ref 4.0–10.5)

## 2011-06-02 LAB — CARDIAC PANEL(CRET KIN+CKTOT+MB+TROPI)
CK, MB: 2.4 ng/mL (ref 0.3–4.0)
Relative Index: INVALID (ref 0.0–2.5)
Total CK: 44 U/L (ref 7–177)
Total CK: 49 U/L (ref 7–177)

## 2011-06-02 LAB — PRO B NATRIURETIC PEPTIDE: Pro B Natriuretic peptide (BNP): 2652 pg/mL — ABNORMAL HIGH (ref 0–450)

## 2011-06-02 LAB — DIFFERENTIAL
Basophils Absolute: 0 10*3/uL (ref 0.0–0.1)
Basophils Relative: 0 % (ref 0–1)
Lymphocytes Relative: 10 % — ABNORMAL LOW (ref 12–46)
Monocytes Absolute: 0.8 10*3/uL (ref 0.1–1.0)
Neutro Abs: 8.4 10*3/uL — ABNORMAL HIGH (ref 1.7–7.7)
Neutrophils Relative %: 82 % — ABNORMAL HIGH (ref 43–77)

## 2011-06-02 LAB — PROTIME-INR
INR: 2.09 — ABNORMAL HIGH (ref 0.00–1.49)
Prothrombin Time: 23.8 seconds — ABNORMAL HIGH (ref 11.6–15.2)

## 2011-06-02 LAB — IRON AND TIBC: TIBC: 368 ug/dL (ref 250–470)

## 2011-06-02 LAB — VITAMIN B12: Vitamin B-12: 420 pg/mL (ref 211–911)

## 2011-06-02 LAB — D-DIMER, QUANTITATIVE: D-Dimer, Quant: <0.22 ug/mL-FEU (ref 0.00–0.48)

## 2011-06-02 LAB — URINE MICROSCOPIC-ADD ON

## 2011-06-02 LAB — FERRITIN: Ferritin: 19 ng/mL (ref 10–291)

## 2011-06-02 MED ORDER — ENSURE COMPLETE PO LIQD
237.0000 mL | Freq: Three times a day (TID) | ORAL | Status: DC
  Administered 2011-06-03 (×2): 237 mL via ORAL
  Administered 2011-06-03: 07:00:00 via ORAL
  Administered 2011-06-04 – 2011-06-05 (×3): 237 mL via ORAL
  Administered 2011-06-05: 07:00:00 via ORAL
  Administered 2011-06-06 – 2011-06-07 (×6): 237 mL via ORAL
  Administered 2011-06-08: 07:00:00 via ORAL
  Administered 2011-06-08 – 2011-06-10 (×7): 237 mL via ORAL

## 2011-06-02 MED ORDER — WARFARIN - PHARMACIST DOSING INPATIENT
Freq: Every day | Status: DC
  Administered 2011-06-07: 1
  Administered 2011-06-09: 18:00:00

## 2011-06-02 MED ORDER — IPRATROPIUM BROMIDE 0.02 % IN SOLN
0.5000 mg | Freq: Once | RESPIRATORY_TRACT | Status: AC
  Administered 2011-06-02: 0.5 mg via RESPIRATORY_TRACT
  Filled 2011-06-02: qty 2.5

## 2011-06-02 MED ORDER — LEVALBUTEROL HCL 0.63 MG/3ML IN NEBU
0.6300 mg | INHALATION_SOLUTION | Freq: Four times a day (QID) | RESPIRATORY_TRACT | Status: DC
  Administered 2011-06-02 – 2011-06-05 (×11): 0.63 mg via RESPIRATORY_TRACT
  Filled 2011-06-02 (×16): qty 3

## 2011-06-02 MED ORDER — SOTALOL HCL 80 MG PO TABS
160.0000 mg | ORAL_TABLET | Freq: Two times a day (BID) | ORAL | Status: DC
  Administered 2011-06-02 – 2011-06-10 (×17): 160 mg via ORAL
  Filled 2011-06-02 (×17): qty 2

## 2011-06-02 MED ORDER — DILTIAZEM HCL ER COATED BEADS 240 MG PO CP24
240.0000 mg | ORAL_CAPSULE | Freq: Every day | ORAL | Status: DC
  Administered 2011-06-02 – 2011-06-04 (×3): 240 mg via ORAL
  Filled 2011-06-02 (×3): qty 1

## 2011-06-02 MED ORDER — CALCIUM CARBONATE-VITAMIN D 500-200 MG-UNIT PO TABS
1.0000 | ORAL_TABLET | Freq: Two times a day (BID) | ORAL | Status: DC
  Administered 2011-06-02 – 2011-06-10 (×17): 1 via ORAL
  Filled 2011-06-02 (×18): qty 1

## 2011-06-02 MED ORDER — ONDANSETRON HCL 4 MG/2ML IJ SOLN
4.0000 mg | Freq: Four times a day (QID) | INTRAMUSCULAR | Status: DC | PRN
  Administered 2011-06-02: 4 mg via INTRAVENOUS
  Filled 2011-06-02: qty 2

## 2011-06-02 MED ORDER — DEXTROSE 5 % IV SOLN
1.0000 g | Freq: Three times a day (TID) | INTRAVENOUS | Status: DC
  Administered 2011-06-02 – 2011-06-03 (×4): 1 g via INTRAVENOUS
  Filled 2011-06-02 (×7): qty 1

## 2011-06-02 MED ORDER — ACETAMINOPHEN 325 MG PO TABS: 650.0000 mg | ORAL_TABLET | ORAL | Status: DC | PRN

## 2011-06-02 MED ORDER — VANCOMYCIN HCL IN DEXTROSE 1-5 GM/200ML-% IV SOLN
1000.0000 mg | INTRAVENOUS | Status: AC
  Administered 2011-06-02: 1000 mg via INTRAVENOUS
  Filled 2011-06-02: qty 200

## 2011-06-02 MED ORDER — PRO-STAT SUGAR FREE PO LIQD
30.0000 mL | Freq: Two times a day (BID) | ORAL | Status: DC
  Administered 2011-06-02 – 2011-06-10 (×17): 30 mL via ORAL
  Filled 2011-06-02 (×18): qty 30

## 2011-06-02 MED ORDER — POLYSACCHARIDE IRON 150 MG PO CAPS
150.0000 mg | ORAL_CAPSULE | Freq: Every day | ORAL | Status: DC
  Administered 2011-06-03 – 2011-06-10 (×8): 150 mg via ORAL
  Filled 2011-06-02 (×17): qty 1

## 2011-06-02 MED ORDER — NITROGLYCERIN 2 % TD OINT
1.0000 [in_us] | TOPICAL_OINTMENT | Freq: Once | TRANSDERMAL | Status: AC
  Administered 2011-06-02: 1 [in_us] via TOPICAL
  Filled 2011-06-02: qty 1

## 2011-06-02 MED ORDER — VANCOMYCIN HCL 1000 MG IV SOLR
750.0000 mg | Freq: Two times a day (BID) | INTRAVENOUS | Status: DC
  Administered 2011-06-03 (×2): 750 mg via INTRAVENOUS
  Filled 2011-06-02 (×3): qty 750

## 2011-06-02 MED ORDER — SOTALOL HCL 80 MG PO TABS
160.0000 mg | ORAL_TABLET | Freq: Two times a day (BID) | ORAL | Status: DC
Start: 2011-06-02 — End: 2011-06-02

## 2011-06-02 MED ORDER — WARFARIN SODIUM 3 MG PO TABS
3.0000 mg | ORAL_TABLET | Freq: Every day | ORAL | Status: DC
  Administered 2011-06-02 – 2011-06-09 (×8): 3 mg via ORAL
  Filled 2011-06-02 (×10): qty 1

## 2011-06-02 MED ORDER — SODIUM CHLORIDE 0.9 % IJ SOLN
3.0000 mL | Freq: Two times a day (BID) | INTRAMUSCULAR | Status: DC
  Administered 2011-06-02 – 2011-06-10 (×17): 3 mL via INTRAVENOUS

## 2011-06-02 MED ORDER — LEVOFLOXACIN IN D5W 750 MG/150ML IV SOLN
750.0000 mg | Freq: Once | INTRAVENOUS | Status: DC
  Filled 2011-06-02: qty 150

## 2011-06-02 MED ORDER — PANTOPRAZOLE SODIUM 40 MG PO TBEC
40.0000 mg | DELAYED_RELEASE_TABLET | Freq: Every day | ORAL | Status: DC
  Administered 2011-06-02 – 2011-06-08 (×7): 40 mg via ORAL
  Filled 2011-06-02 (×7): qty 1

## 2011-06-02 MED ORDER — IPRATROPIUM BROMIDE 0.02 % IN SOLN
RESPIRATORY_TRACT | Status: AC
  Filled 2011-06-02: qty 2.5

## 2011-06-02 MED ORDER — GUAIFENESIN-CODEINE 100-10 MG/5ML PO SOLN: 5.0000 mL | Freq: Four times a day (QID) | ORAL | Status: DC | PRN

## 2011-06-02 MED ORDER — SODIUM CHLORIDE 0.9 % IV SOLN: 250.0000 mL | INTRAVENOUS | Status: DC | PRN

## 2011-06-02 MED ORDER — ALBUTEROL SULFATE (5 MG/ML) 0.5% IN NEBU
2.5000 mg | INHALATION_SOLUTION | Freq: Once | RESPIRATORY_TRACT | Status: AC
  Administered 2011-06-02: 2.5 mg via RESPIRATORY_TRACT
  Filled 2011-06-02: qty 0.5

## 2011-06-02 MED ORDER — IPRATROPIUM BROMIDE 0.02 % IN SOLN
0.5000 mg | Freq: Four times a day (QID) | RESPIRATORY_TRACT | Status: DC
  Administered 2011-06-02 – 2011-06-05 (×11): 0.5 mg via RESPIRATORY_TRACT
  Filled 2011-06-02 (×12): qty 2.5

## 2011-06-02 MED ORDER — ALBUTEROL SULFATE (5 MG/ML) 0.5% IN NEBU
INHALATION_SOLUTION | RESPIRATORY_TRACT | Status: AC
  Filled 2011-06-02: qty 0.5

## 2011-06-02 MED ORDER — BUMETANIDE 0.25 MG/ML IJ SOLN
1.0000 mg | Freq: Every day | INTRAMUSCULAR | Status: DC
  Administered 2011-06-02 – 2011-06-04 (×3): 1 mg via INTRAVENOUS
  Filled 2011-06-02 (×5): qty 4

## 2011-06-02 MED ORDER — SODIUM CHLORIDE 0.9 % IJ SOLN: 3.0000 mL | INTRAMUSCULAR | Status: DC | PRN

## 2011-06-02 MED ORDER — RAMIPRIL 10 MG PO CAPS
10.0000 mg | ORAL_CAPSULE | Freq: Every day | ORAL | Status: DC
  Administered 2011-06-03 – 2011-06-10 (×8): 10 mg via ORAL
  Filled 2011-06-02 (×8): qty 1

## 2011-06-02 MED ORDER — DEXTROSE 5 % IV SOLN
2.0000 g | Freq: Once | INTRAVENOUS | Status: DC
  Filled 2011-06-02: qty 2

## 2011-06-02 MED ORDER — LEVOFLOXACIN IN D5W 750 MG/150ML IV SOLN
750.0000 mg | INTRAVENOUS | Status: DC
  Administered 2011-06-02 – 2011-06-03 (×2): 750 mg via INTRAVENOUS
  Filled 2011-06-02: qty 150

## 2011-06-02 NOTE — Progress Notes (Addendum)
ANTIBIOTIC CONSULT NOTE - INITIAL  Pharmacy Consult for Vanco, azactam, coumadin Indication: pneumonia/afib  Allergies  Allergen Reactions  . Lasix (Furosemide) Swelling  . Penicillins Swelling    Mouth swelling and redness  . Sulfa Antibiotics Rash    Patient Measurements: Height: 5\' 8"  (172.7 cm) Weight: 180 lb 6 oz (81.818 kg) IBW/kg (Calculated) : 63.9  Adjusted Body Weight: 82  Vital Signs: Temp: 99.3 F (37.4 C) (03/19 1000) Temp src: Rectal (03/19 1000) BP: 138/73 mmHg (03/19 1052) Pulse Rate: 65  (03/19 1052) Intake/Output from previous day:   Intake/Output from this shift:    Labs:  Basename 06/02/11 0945  WBC 10.3  HGB 8.4*  PLT 345  LABCREA --  CREATININE 0.71   Estimated Creatinine Clearance: 56.7 ml/min (by C-G formula based on Cr of 0.71). No results found for this basename: VANCOTROUGH:2,VANCOPEAK:2,VANCORANDOM:2,GENTTROUGH:2,GENTPEAK:2,GENTRANDOM:2,TOBRATROUGH:2,TOBRAPEAK:2,TOBRARND:2,AMIKACINPEAK:2,AMIKACINTROU:2,AMIKACIN:2, in the last 72 hours   Microbiology: No results found for this or any previous visit (from the past 720 hour(s)).  Medical History: Past Medical History  Diagnosis Date  . Diastolic heart failure   . Ovarian cancer   . Ovarian cancer   . Hypertension   . Atrial fibrillation   . Tachy-brady syndrome   . CHF (congestive heart failure)     diastolic heart failure  . Colon cancer   . Ovarian cancer     Medications:  Med rec pending  Assessment: Patient woke up this am with dizziness, chest tightness and SOB. Productive cough x 2d. Tmax 99.3. WBC elevated at 10.3. Scr 0.71 with estimated CrCl 55-60.  Cardiology: HF, HTN, Afib, tachy/brady. Patient takes Coumadin at home 3mg  daily. Admit INR 2.09   Goal of Therapy:  Vancomycin trough level 15-20 mcg/ml  Plan:  Vancomycin 1g IV x 1 in ER then 750mg  IV q12h. Aztreonam 1g IV q8 Coumadin 3mg  qday Daily PT/INR

## 2011-06-02 NOTE — Progress Notes (Signed)
  Echocardiogram 2D Echocardiogram has been performed.  Vikas Wegmann, Real Cons 06/02/2011, 5:48 PM

## 2011-06-02 NOTE — ED Provider Notes (Signed)
History     CSN: [id number]  Arrival date & time 06/02/11  0932   First MD Initiated Contact with Patient 06/02/11 [phone]      Chief Complaint  Patient presents with  . Shortness of Breath  . Dizziness  . Fatigue  . Chest Pain    HPI Pt was seen at 0940.   Per pt, c/o gradual onset and persistence of constant lightheadedness that began this morning after waking up.  Pt describes the lightheadedness as "feeling like I was going to pass out."  Has been assoc with cough, SOB, and vague chest "tightness."  EMS gave ntg without relief.  Denies palpitations, no abd pain, no N/V/D, no fevers, no rash, no back pain.   PMD:  Ignacia Bayley FP Past Medical History  Diagnosis Date  . Diastolic heart failure   . Ovarian cancer   . Ovarian cancer   . Hypertension   . Atrial fibrillation   . Tachy-brady syndrome   . CHF (congestive heart failure)     diastolic heart failure  . Colon cancer   . Ovarian cancer     Past Surgical History  Procedure Date  . Abdominal hysterectomy   . Oophorectomy   . Colonoscopy 03/30/2011    Procedure: COLONOSCOPY;  Surgeon: Barrie Folk, MD;  Location: [facility] ENDOSCOPY;  Service: Endoscopy;  Laterality: N/A;  . Esophagogastroduodenoscopy 03/30/2011    Procedure: ESOPHAGOGASTRODUODENOSCOPY (EGD);  Surgeon: Barrie Folk, MD;  Location: [facility] ENDOSCOPY;  Service: Endoscopy;  Laterality: N/A;  . Colon resection 04/03/2011    Procedure: COLON RESECTION LAPAROSCOPIC;  Surgeon: Atilano Ina, MD;  Location: [facility] OR;  Service: General;  Laterality: N/A;  Laparoscopic, turned open sigmoid colon resection, lysis of adhesions x 2.5 hours, rigid proctoscopy.  . Pacemaker placement     Family History  Problem Relation Age of Onset  . Anesthesia problems Neg Hx   . Hypotension Neg Hx   . Malignant hyperthermia Neg Hx   . Pseudochol deficiency Neg Hx   . Cancer Mother     ovarian    History  Substance Use Topics  . Smoking status: Never Smoker   . Smokeless  tobacco: Never Used  . Alcohol Use: No    Review of Systems ROS: Statement: All systems negative except as marked or noted in the HPI; Constitutional: Negative for fever and chills. ; ; Eyes: Negative for eye pain, redness and discharge. ; ; ENMT: Negative for ear pain, hoarseness, nasal congestion, sinus pressure and sore throat. ; ; Cardiovascular: +CP/SOB.  Negative for chest pain, palpitations, diaphoresis, and peripheral edema. ; ; Respiratory: +cough. Negative for wheezing and stridor. ; ; Gastrointestinal: Negative for nausea, vomiting, diarrhea, abdominal pain, blood in stool, hematemesis, jaundice and rectal bleeding. . ; ; Genitourinary: Negative for dysuria, flank pain and hematuria. ; ; Musculoskeletal: Negative for back pain and neck pain. Negative for swelling and trauma.; ; Skin: Negative for pruritus, rash, abrasions, blisters, bruising and skin lesion.; ; Neuro: +lightheadedness.  Negative for headache and neck stiffness. Negative for weakness, altered level of consciousness , altered mental status, extremity weakness, paresthesias, involuntary movement, seizure and syncope.     Allergies  Lasix; Penicillins; and Sulfa antibiotics  Home Medications   Current Outpatient Rx  Name Route Sig Dispense Refill  . ACETAMINOPHEN 325 MG PO TABS Oral Take 2 tablets (650 mg total) by mouth every 6 (six) hours as needed.    Marland Kitchen CALCIUM CARBONATE-VITAMIN D 500-200 MG-UNIT PO TABS Oral Take  1 tablet by mouth 2 (two) times daily.    . DENOSUMAB 60 MG/ML [place] SOLN Subcutaneous Inject 60 mg into the skin every 6 (six) months.     Marland Kitchen DILTIAZEM HCL ER COATED BEADS 240 MG PO CP24 Oral Take 1 capsule (240 mg total) by mouth daily. 30 capsule 0  . STOOL SOFTENER PO Oral Take by mouth daily.    Marland Kitchen FAMOTIDINE 20 MG PO TABS Oral Take 1 tablet (20 mg total) by mouth 2 (two) times daily.    Marland Kitchen ENSURE IMMUNE HEALTH PO LIQD Oral Take 237 mLs by mouth 3 (three) times daily with meals.    Marland Kitchen METRONIDAZOLE 500 MG PO  TABS Oral Take 500 mg by mouth 3 (three) times daily.    . OXYCODONE-ACETAMINOPHEN 5-325 MG PO TABS Oral Take 1 tablet by mouth every 4 (four) hours as needed.    Marland Kitchen MIRALAX PO Oral Take by mouth as needed.    Marland Kitchen POLYSACCHARIDE IRON 150 MG PO CAPS Oral Take 150 mg by mouth every morning.     Marland Kitchen POTASSIUM CHLORIDE CRYS ER 20 MEQ PO TBCR Oral Take 20 mEq by mouth 2 (two) times daily.    Marland Kitchen PROBIOTIC PO Oral Take by mouth 2 (two) times daily.    Marland Kitchen PROMETHAZINE HCL 12.5 MG PO TABS Oral Take 12.5 mg by mouth every 6 (six) hours as needed.    Marland Kitchen RAMIPRIL 10 MG PO CAPS Oral Take 10 mg by mouth daily.      Marland Kitchen SOTALOL HCL 160 MG PO TABS Oral Take 1 tablet (160 mg total) by mouth 2 (two) times daily. 60 tablet 0  . WARFARIN SODIUM 5 MG PO TABS Oral Take 0.5-1 tablets (2.5-5 mg total) by mouth as directed. Take 2.5 mg (half a tablet) on thursdays, all other days take 5 mg (1 tablet) Preadmission dose.  Use the new dose as described below.  This is only for your caretakers history.      2.5 mg Tuesday, Thursday, and Saturday. 5mg  Monda ...    BP 138/85  Pulse 65  Temp(Src) 99.1 F (37.3 C) (Oral)  Resp 16  Ht 5\' 8"  (1.727 m)  Wt 180 lb 6 oz (81.818 kg)  BMI 27.43 kg/m2  SpO2 95%  Physical Exam 0945: Physical examination:  Nursing notes reviewed; Vital signs and O2 SAT reviewed;  Constitutional: Well developed, Well nourished, In no acute distress; Head:  Normocephalic, atraumatic; Eyes: EOMI, PERRL, No scleral icterus; ENMT: Mouth and pharynx normal, Mucous membranes dry; Neck: Supple, Full range of motion, No lymphadenopathy; Cardiovascular: Irregular irregular rate and rhythm, No gallop; Respiratory: Breath sounds coarse & equal bilaterally, No wheezes, Normal respiratory effort/excursion; Chest: Nontender, Movement normal; Abdomen: Soft, Nontender, Nondistended, Normal bowel sounds; Genitourinary: No CVA tenderness; Extremities: Pulses normal, No tenderness, No edema, No calf edema or asymmetry.; Neuro:  AA&Ox3, Major CN grossly intact. Speech clear, no facial droop. No gross focal motor or sensory deficits in extremities.; Skin: Color pale, Warm, Dry, no rash.   ED Course  Procedures   MDM  MDM Reviewed: previous chart, nursing note and vitals Reviewed previous: labs and ECG Interpretation: ECG, labs and x-ray    Date: 06/02/2011  Rate: 65  Rhythm: atrial fibrillation  QRS Axis: left  Intervals: normal  ST/T Wave abnormalities: normal  Conduction Disutrbances:right bundle branch block and left anterior fascicular block  Narrative Interpretation:  LVH with repol abnl  Old EKG Reviewed: changes noted; previous EKG dated 04/08/2011 with sinus tachycardia  Results for orders placed during the hospital encounter of 06/02/11  CBC      Component Value Range   WBC 10.3  4.0 - 10.5 (K/uL)   RBC 3.56 (*) 3.87 - 5.11 (MIL/uL)   Hemoglobin 8.4 (*) 12.0 - 15.0 (g/dL)   HCT 16.1 (*) 09.6 - 46.0 (%)   MCV 78.7  78.0 - 100.0 (fL)   MCH 23.6 (*) 26.0 - 34.0 (pg)   MCHC 30.0  30.0 - 36.0 (g/dL)   RDW 04.5 (*) 40.9 - 15.5 (%)   Platelets 345  150 - 400 (K/uL)  DIFFERENTIAL      Component Value Range   Neutrophils Relative 82 (*) 43 - 77 (%)   Neutro Abs 8.4 (*) 1.7 - 7.7 (K/uL)   Lymphocytes Relative 10 (*) 12 - 46 (%)   Lymphs Abs 1.0  0.7 - 4.0 (K/uL)   Monocytes Relative 8  3 - 12 (%)   Monocytes Absolute 0.8  0.1 - 1.0 (K/uL)   Eosinophils Relative 0  0 - 5 (%)   Eosinophils Absolute 0.0  0.0 - 0.7 (K/uL)   Basophils Relative 0  0 - 1 (%)   Basophils Absolute 0.0  0.0 - 0.1 (K/uL)  BASIC METABOLIC PANEL      Component Value Range   Sodium 137  135 - 145 (mEq/L)   Potassium 4.2  3.5 - 5.1 (mEq/L)   Chloride 102  96 - 112 (mEq/L)   CO2 27  19 - 32 (mEq/L)   Glucose, Bld 194 (*) 70 - 99 (mg/dL)   BUN 23  6 - 23 (mg/dL)   Creatinine, Ser 8.11  0.50 - 1.10 (mg/dL)   Calcium 8.8  8.4 - 91.4 (mg/dL)   GFR calc non Af Amer 76 (*) >90 (mL/min)   GFR calc Af Amer 88 (*) >90 (mL/min)    PROTIME-INR      Component Value Range   Prothrombin Time 23.8 (*) 11.6 - 15.2 (seconds)   INR 2.09 (*) 0.00 - 1.49   TROPONIN I      Component Value Range   Troponin I <0.30  <0.30 (ng/mL)  URINALYSIS, ROUTINE W REFLEX MICROSCOPIC      Component Value Range   Color, Urine YELLOW  YELLOW    APPearance CLEAR  CLEAR    Specific Gravity, Urine 1.027  1.005 - 1.030    pH 5.5  5.0 - 8.0    Glucose, UA NEGATIVE  NEGATIVE (mg/dL)   Hgb urine dipstick NEGATIVE  NEGATIVE    Bilirubin Urine NEGATIVE  NEGATIVE    Ketones, ur NEGATIVE  NEGATIVE (mg/dL)   Protein, ur 30 (*) NEGATIVE (mg/dL)   Urobilinogen, UA 1.0  0.0 - 1.0 (mg/dL)   Nitrite NEGATIVE  NEGATIVE    Leukocytes, UA NEGATIVE  NEGATIVE   PRO B NATRIURETIC PEPTIDE      Component Value Range   Pro B Natriuretic peptide (BNP) 2652.0 (*) 0 - 450 (pg/mL)  D-DIMER, QUANTITATIVE      Component Value Range   D-Dimer, Quant <0.22  0.00 - 0.48 (ug/mL-FEU)  URINE MICROSCOPIC-ADD ON      Component Value Range   Squamous Epithelial / LPF RARE  RARE    WBC, UA 0-2  <3 (WBC/hpf)   RBC / HPF 0-2  <3 (RBC/hpf)   Bacteria, UA RARE  RARE    Results for MARLENNE, RIDGE (MRN [id number]) as of 06/02/2011 11:10  Ref. Range 04/11/2011 05:00 04/12/2011 08:42 04/13/2011 06:45 06/02/2011  09:45  HGB Latest Range: 12.0-15.0 g/dL 9.6 (L) 40.9 (L) 9.3 (L) 8.4 (L)  HCT Latest Range: 36.0-46.0 % 32.0 (L) 35.0 (L) 30.5 (L) 28.0 (L)   Results for TOSHI, ISHII (MRN [id number]) as of 06/02/2011 11:10  Ref. Range 03/27/2011 23:16 03/29/2011 00:00 06/02/2011 09:41  Pro B Natriuretic peptide (BNP) Latest Range: 0-450 pg/mL 4190.0 (H) 2526.0 (H) 2652.0 (H)    Dg Chest Port 1 View 06/02/2011  *RADIOLOGY REPORT*  Clinical Data: Chest tightness.  Shortness of breath.  Cough. Pacemaker.  Hypertension.  PORTABLE CHEST - 1 VIEW  Comparison: 03/31/2011  Findings: Pulmonary vascular indistinctness noted with interstitial accentuation and faint right infrahilar and left lower lobe  airspace opacity.  Mild cardiomegaly noted with atherosclerotic calcification of the aortic arch.  Dual lead pacer remains in place.  IMPRESSION:  1.  Cardiomegaly with vascular indistinctness and interstitial opacity favoring interstitial pulmonary edema.  There may be faint airspace edema in the right infrahilar and left lower lobe regions. Superimposed pneumonia is not completely excluded.  Original Report Authenticated By: Dellia Cloud, M.D.     11:09 AM:  On arrival, pt's O2 Sats 88% R/A, increased to 95% on O2 2L N/C.  No hx COPD/asthma.  BNP elevated, but not much higher than previous.  CXR concerning for pneumonia.  Will tx for HCAP with IV abx and ntg for CHF (pt allergic to lasix).  Dx testing d/w pt and family.  Questions answered.  Verb understanding, agreeable to admit.  11:38 AM:  T/C to [facility] Dr. Isidoro Donning, case discussed, including:  HPI, pertinent PM/SHx, VS/PE, dx testing, ED course and treatment:  Agreeable to admit, requests to obtain tele bed to team 5.        Laray Anger, DO 06/04/11 1358

## 2011-06-02 NOTE — Progress Notes (Signed)
Patient's HR increased to the 150s nonsustained, MD paged and notified, nurse is awaiting call back from MD__D. Manson Passey RN

## 2011-06-02 NOTE — ED Notes (Signed)
Pt states that she woke up this morning and was feeling dizzy and felt like she was going to pass out. At that time she also had some tightness in her chest and some mild shortness of breath. She is alert and oriented. Pt received 2 sprays of sl nitro with ems and she stated to ems that this did help relieve the pain. She has been having a productive cough over the past few days.

## 2011-06-02 NOTE — ED Notes (Signed)
Pt undressed, in gown, on monitor, continuous pulse oximetry, blood pressure cuff and oxygen [place] (2L); EKG performed by me

## 2011-06-02 NOTE — Progress Notes (Signed)
Utilization Review Completed.[name] T3/19/2013   

## 2011-06-02 NOTE — H&P (Signed)
History and Physical       Hospital Admission Note Date: 06/02/2011  Patient name: [name] Medical record number: [id number] Date of birth: 20-Sep-1922 Age: 78 y.o. Gender: female PCP: Rudi Heap, MD, MD  Attending physician: Cathren Harsh, MD  Primary cardiologist: Dr. Everette Rank  Chief Complaint:    HPI: Patient is a 78 year old female with history of diastolic CHF, awaiting cancer, hypertension, atrial fibrillation on Coumadin, pacemaker for tachybradycardia syndrome, recent open sigmoid colectomy for sigmoid colon cancer in January 2013 presented from home with shortness of breath and feeling congested today. History was obtained from the patient who states that she was in her usual state of health until yesterday, woke up with 'smothering feeling' and shortness of breath. She try to check her weight, however felt dizzy and lightheaded and sat down. She denied any syncopal episode. She denied any chest pain, fevers or chills, nausea or vomiting. She did admit to having productive cough in the last few days. She denies any worsening orthopnea or PND.   Review of Systems:  Constitutional: Denies fever, chills, diaphoresis, appetite change and fatigue.  HEENT: Denies photophobia, eye pain, redness, hearing loss, ear pain, congestion, sore throat, rhinorrhea, sneezing, mouth sores, trouble swallowing, neck pain, neck stiffness and tinnitus.   Respiratory: Please see history of present illness  Cardiovascular: Please see history of present illness  Gastrointestinal: Denies nausea, vomiting, abdominal pain, diarrhea, constipation, blood in stool and abdominal distention.  Genitourinary: Denies dysuria, urgency, frequency, hematuria, flank pain and difficulty urinating.  Musculoskeletal: Denies myalgias, back pain, joint swelling, arthralgias and gait problem.  Skin: Denies pallor, rash and wound.  Neurological: Denies seizures,  syncope, numbness and headaches.  Hematological: Denies adenopathy. Easy bruising, personal or family bleeding history  Psychiatric/Behavioral: Denies suicidal ideation, mood changes, confusion, nervousness, sleep disturbance and agitation  Past Medical History: Past Medical History  Diagnosis Date  . Diastolic heart failure   . Ovarian cancer   . Ovarian cancer   . Hypertension   . Atrial fibrillation   . Tachy-brady syndrome   . CHF (congestive heart failure)     diastolic heart failure  . Colon cancer   . Ovarian cancer    Past Surgical History  Procedure Date  . Abdominal hysterectomy   . Oophorectomy   . Colonoscopy 03/30/2011    Procedure: COLONOSCOPY;  Surgeon: Barrie Folk, MD;  Location: [facility] ENDOSCOPY;  Service: Endoscopy;  Laterality: N/A;  . Esophagogastroduodenoscopy 03/30/2011    Procedure: ESOPHAGOGASTRODUODENOSCOPY (EGD);  Surgeon: Barrie Folk, MD;  Location: [facility] ENDOSCOPY;  Service: Endoscopy;  Laterality: N/A;  . Colon resection 04/03/2011    Procedure: COLON RESECTION LAPAROSCOPIC;  Surgeon: Atilano Ina, MD;  Location: [facility] OR;  Service: General;  Laterality: N/A;  Laparoscopic, turned open sigmoid colon resection, lysis of adhesions x 2.5 hours, rigid proctoscopy.  . Pacemaker placement     Medications: Prior to Admission medications   Medication Sig Start Date End Date Taking? Authorizing Provider  calcium-vitamin D (OSCAL WITH D) 500-200 MG-UNIT per tablet Take 1 tablet by mouth 2 (two) times daily.   Yes Historical Provider, MD  denosumab (PROLIA) 60 MG/ML SOLN Inject 60 mg into the skin every 6 (six) months.    Yes Historical Provider, MD  diltiazem (CARDIZEM CD) 240 MG 24 hr capsule Take 240 mg by mouth daily.   Yes Historical Provider, MD  feeding supplement (ENSURE IMMUNE HEALTH) LIQD Take 237 mLs by mouth 3 (three) times daily with meals.  Yes Historical Provider, MD  feeding supplement (PRO-STAT SUGAR FREE 64) LIQD Take 30 mLs by mouth 2 (two) times  daily.   Yes Historical Provider, MD  pantoprazole (PROTONIX) 40 MG tablet Take 40 mg by mouth daily.   Yes Historical Provider, MD  polysaccharide iron (NIFEREX) 150 MG CAPS capsule Take 150 mg by mouth every morning.    Yes Historical Provider, MD  ramipril (ALTACE) 10 MG capsule Take 10 mg by mouth daily.     Yes Historical Provider, MD  sotalol (BETAPACE) 160 MG tablet Take 160 mg by mouth 2 (two) times daily.   Yes Historical Provider, MD  warfarin (COUMADIN) 3 MG tablet Take 3 mg by mouth daily.   Yes Historical Provider, MD    Allergies:   Allergies  Allergen Reactions  . Lasix (Furosemide) Swelling  . Penicillins Swelling    Mouth swelling and redness  . Sulfa Antibiotics Rash    Social History:  reports that she has never smoked. She has never used smokeless tobacco. She reports that she does not drink alcohol or use illicit drugs.  Family History: Family History  Problem Relation Age of Onset  . Anesthesia problems Neg Hx   . Hypotension Neg Hx   . Malignant hyperthermia Neg Hx   . Pseudochol deficiency Neg Hx   . Cancer Mother     ovarian    Physical Exam: Blood pressure 139/74, pulse 65, temperature 99.3 F (37.4 C), temperature source Rectal, resp. rate 18, height 5\' 8"  (1.727 m), weight 81.818 kg (180 lb 6 oz), SpO2 95.00%. General: Alert, awake, oriented x3, in no acute distress. HEENT: anicteric sclera, pink conjunctiva, pupils equal and reactive to light and accomodation Neck: supple, no masses or lymphadenopathy, no goiter, no bruits  Heart: Regular rate and rhythm, without murmurs, rubs or gallops. Lungs: Bibasilar crackles Abdomen: Soft, nontender, nondistended, positive bowel sounds, no masses. Extremities: No clubbing, cyanosis or edema with positive pedal pulses. Neuro: Grossly intact, no focal neurological deficits, strength 5/5 upper and lower extremities bilaterally Psych: alert and oriented x 3, normal mood and affect Skin: no rashes or lesions,  warm and dry   LABS on Admission:  Basic Metabolic Panel:  Lab 06/02/11 4782  NA 137  K 4.2  CL 102  CO2 27  GLUCOSE 194*  BUN 23  CREATININE 0.71  CALCIUM 8.8  MG --  PHOS --   CBC:  Lab 06/02/11 0945  WBC 10.3  NEUTROABS 8.4*  HGB 8.4*  HCT 28.0*  MCV 78.7  PLT 345   Cardiac Enzymes:  Lab 06/02/11 0941  CKTOTAL --  CKMB --  CKMBINDEX --  TROPONINI <0.30     Radiological Exams on Admission: Dg Chest Port 1 View  06/02/2011  *RADIOLOGY REPORT*  Clinical Data: Chest tightness.  Shortness of breath.  Cough. Pacemaker.  Hypertension.  PORTABLE CHEST - 1 VIEW  Comparison: 03/31/2011  Findings: Pulmonary vascular indistinctness noted with interstitial accentuation and faint right infrahilar and left lower lobe airspace opacity.  Mild cardiomegaly noted with atherosclerotic calcification of the aortic arch.  Dual lead pacer remains in place.  IMPRESSION:  1.  Cardiomegaly with vascular indistinctness and interstitial opacity favoring interstitial pulmonary edema.  There may be faint airspace edema in the right infrahilar and left lower lobe regions. Superimposed pneumonia is not completely excluded.  Original Report Authenticated By: Dellia Cloud, M.D.    Assessment/Plan Present on Admission:   .Diastolic CHF, acute on chronic: - Patient will be admitted  to telemetry floor, rule out ACS. BNP elevated at 2652, d-dimer negative chest x-ray consistent with interstitial edema - Patient is allergic to Lasix, I will give one dose of Bumex, consulted cardiology ( patient follows Dr. Eldridge Dace), 2D ECHO - Daily weights, strict I.'s and O.'s, PT eval (patient has been in and out of hospitals, rehab/SNF in the last 3 months) - Continue cardizem, sotalol, ACEI, on coumadin   .Pneumonia/ HCAP:  - Placed on O2 supplementation, nebs, IV antibiotics, narrow antibiotics in 24-48 hours depending on cultures and clinical stability   .Hypertension:  - stable  .Atrial  fibrillation: - Rate controlled, continue Cardizem, sotalol, Coumadin   .Anemia:  - H&H slightly low, continue iron supplementation, anemia profile, stool occult test   .Cancer of sigmoid colon s/p sigmoid colectomy for T3N0 lesion on 04/03/11  DVT prophylaxis: On Coumadin  CODE STATUS: Discussed in detail with the patient, she opted to be DO NOT RESUSCITATE  Further plan will depend as patient's clinical course evolves and further radiologic and laboratory data become available.   @Time  Spent on Admission: 1 hour  Ronelle Michie M.D. [facility] 06/02/2011, 1:26 PM

## 2011-06-02 NOTE — ED Notes (Signed)
Spoke with pharmacy re: Levaquin & Azactam admin with pt Penicillin Allergy, Levaquin safe to admin., will talk with Clarene Duke, MD re: Madlyn Frankel of Azactam

## 2011-06-02 NOTE — Consult Note (Signed)
Admit date: 06/02/2011 Referring Physician  Dr. Isidoro Donning Primary Physician  Dr. Christell Constant Primary Cardiologist  Dr. Eldridge Dace Reason for Consultation  CHF  HPI: This is an 78 year old female with history of diastolic CHF,  hypertension, atrial fibrillation on Coumadin, pacemaker for tachybradycardia syndrome, recent open sigmoid colectomy for sigmoid colon cancer in January 2013 presented from home with shortness of breath and feeling congested today. She was in her usual state of health until yesterday, woke up with 'smothering feeling' and shortness of breath. She try to check her weight, however felt dizzy and lightheaded and sat down. She denied any syncopal episode. She denied any chest pain, fevers or chills, nausea or vomiting. She did admit to having productive cough in the last few days. She denies any worsening orthopnea or PND. She was found to be in CHF and we are now asked to consult.        PMH:   Past Medical History  Diagnosis Date  . Diastolic heart failure   . Ovarian cancer   . Hypertension   . Atrial fibrillation   . Tachy-brady syndrome   . CHF (congestive heart failure)     diastolic heart failure  . Colon cancer   . Ovarian cancer      PSH:   Past Surgical History  Procedure Date  . Abdominal hysterectomy   . Oophorectomy   . Colonoscopy 03/30/2011    Procedure: COLONOSCOPY;  Surgeon: Barrie Folk, MD;  Location[facility] ENDOSCOPY;  Service: Endoscopy;  Laterality: N/A;  . Esophagogastroduodenoscopy 03/30/2011    Procedure: ESOPHAGOGASTRODUODENOSCOPY (EGD);  Surgeon: Barrie Folk, MD;  Location: [facility] ENDOSCOPY;  Service: Endoscopy;  Laterality: N/A;  . Colon resection 04/03/2011    Procedure: COLON RESECTION LAPAROSCOPIC;  Surgeon: Atilano Ina, MD;  Location: [facility] OR;  Service: General;  Laterality: N/A;  Laparoscopic, turned open sigmoid colon resection, lysis of adhesions x 2.5 hours, rigid proctoscopy.  . Pacemaker placement     Allergies:  Lasix; Penicillins; and Sulfa  antibiotics Prior to Admit Meds:   Prescriptions prior to admission  Medication Sig Dispense Refill  . calcium-vitamin D (OSCAL WITH D) 500-200 MG-UNIT per tablet Take 1 tablet by mouth 2 (two) times daily.      Marland Kitchen denosumab (PROLIA) 60 MG/ML SOLN Inject 60 mg into the skin every 6 (six) months.       . diltiazem (CARDIZEM CD) 240 MG 24 hr capsule Take 240 mg by mouth daily.      . feeding supplement (ENSURE IMMUNE HEALTH) LIQD Take 237 mLs by mouth 3 (three) times daily with meals.      . feeding supplement (PRO-STAT SUGAR FREE 64) LIQD Take 30 mLs by mouth 2 (two) times daily.      . pantoprazole (PROTONIX) 40 MG tablet Take 40 mg by mouth daily.      . polysaccharide iron (NIFEREX) 150 MG CAPS capsule Take 150 mg by mouth every morning.       . ramipril (ALTACE) 10 MG capsule Take 10 mg by mouth daily.        . sotalol (BETAPACE) 160 MG tablet Take 160 mg by mouth 2 (two) times daily.      Marland Kitchen warfarin (COUMADIN) 3 MG tablet Take 3 mg by mouth daily.       Fam HX:    Family History  Problem Relation Age of Onset  . Anesthesia problems Neg Hx   . Hypotension Neg Hx   . Malignant hyperthermia Neg Hx   .  Pseudochol deficiency Neg Hx   . Cancer Mother     ovarian   Social HX:    History   Social History  . Marital Status: Widowed    Spouse Name: N/A    Number of Children: N/A  . Years of Education: N/A   Occupational History  . Not on file.   Social History Main Topics  . Smoking status: Never Smoker   . Smokeless tobacco: Never Used  . Alcohol Use: No  . Drug Use: No  . Sexually Active: No   Other Topics Concern  . Not on file   Social History Narrative  . No narrative on file     ROS:  All 11 ROS were addressed and are negative except what is stated in the HPI  Physical Exam: Blood pressure 146/72, pulse 91, temperature 99.1 F (37.3 C), temperature source Oral, resp. rate 18, height 5\' 8"  (1.727 m), weight 81.818 kg (180 lb 6 oz), SpO2 96.00%.    General: Well  developed, well nourished, in no acute distress Head: Eyes PERRLA, No xanthomas.   Normal cephalic and atramatic  Lungs:   Scattered wheezes. Heart:   HRRR S1 S2 Pulses are 2+ & equal.            No carotid bruit. No JVD.  No abdominal bruits. No femoral bruits. Abdomen: Bowel sounds are positive, abdomen soft and non-tender without masses  Extremities:   No clubbing, cyanosis or edema.  DP +1 Neuro: Alert and oriented X 3. Psych:  Good affect, responds appropriately    Labs:   Lab Results  Component Value Date   WBC 10.3 06/02/2011   HGB 8.4* 06/02/2011   HCT 28.0* 06/02/2011   MCV 78.7 06/02/2011   PLT 345 06/02/2011    Lab 06/02/11 0945  NA 137  K 4.2  CL 102  CO2 27  BUN 23  CREATININE 0.71  CALCIUM 8.8  PROT --  BILITOT --  ALKPHOS --  ALT --  AST --  GLUCOSE 194*   No results found for this basename: PTT   Lab Results  Component Value Date   INR 2.09* 06/02/2011   INR 1.70* 04/13/2011   INR 1.77* 04/12/2011   Lab Results  Component Value Date   CKTOTAL 66 03/28/2011   CKMB 2.8 03/28/2011   TROPONINI <0.30 06/02/2011         Radiology:  Dg Chest Port 1 View  06/02/2011  *RADIOLOGY REPORT*  Clinical Data: Chest tightness.  Shortness of breath.  Cough. Pacemaker.  Hypertension.  PORTABLE CHEST - 1 VIEW  Comparison: 03/31/2011  Findings: Pulmonary vascular indistinctness noted with interstitial accentuation and faint right infrahilar and left lower lobe airspace opacity.  Mild cardiomegaly noted with atherosclerotic calcification of the aortic arch.  Dual lead pacer remains in place.  IMPRESSION:  1.  Cardiomegaly with vascular indistinctness and interstitial opacity favoring interstitial pulmonary edema.  There may be faint airspace edema in the right infrahilar and left lower lobe regions. Superimposed pneumonia is not completely excluded.  Original Report Authenticated By: Dellia Cloud, M.D.    EKG:  Undetermined rhythm - possibly junctional rhythm vs. afib  with LBBB  ASSESSMENT:  1.  Acute on chronic diastolic CHF possibly exacerbated by significant anemia/URI 2.  Atrial fibrillation - unclear by EKG if she is in afib or junctional rhythm 3.  Systemic anticoagulation with therapeutic INR 4.  Hypertension 5.  Tachybrady syndrome 6.  Anemia 7.  Probable Pneumonia/bronchitis  PLAN:   1.  Bumex for CHF since she is allergic to Lasix 2.  Echo pending 3.  Continue coumadin per pharmacy 4.  Further w/u per Dr. Eldridge Dace 5.  Anemia workup - iron panel, ferritin, heme check stools, B12, folate, consider GI consult  Quintella Reichert, MD  06/02/2011  2:29 PM

## 2011-06-03 LAB — URINE CULTURE
Colony Count: NO GROWTH
Culture: NO GROWTH

## 2011-06-03 LAB — CBC
HCT: 27.1 % — ABNORMAL LOW (ref 36.0–46.0)
Hemoglobin: 8.2 g/dL — ABNORMAL LOW (ref 12.0–15.0)
MCH: 23.6 pg — ABNORMAL LOW (ref 26.0–34.0)
MCHC: 30.3 g/dL (ref 30.0–36.0)
RBC: 3.47 MIL/uL — ABNORMAL LOW (ref 3.87–5.11)

## 2011-06-03 LAB — CARDIAC PANEL(CRET KIN+CKTOT+MB+TROPI)
CK, MB: 2.7 ng/mL (ref 0.3–4.0)
CK, MB: 2.7 ng/mL (ref 0.3–4.0)
Relative Index: INVALID (ref 0.0–2.5)
Relative Index: INVALID (ref 0.0–2.5)
Total CK: 48 U/L (ref 7–177)
Total CK: 50 U/L (ref 7–177)
Troponin I: <0.3 ng/mL (ref <0.30)

## 2011-06-03 LAB — BASIC METABOLIC PANEL
BUN: 21 mg/dL (ref 6–23)
CO2: 28 mEq/L (ref 19–32)
Chloride: 101 mEq/L (ref 96–112)
GFR calc non Af Amer: 74 mL/min — ABNORMAL LOW (ref >90)
Glucose, Bld: 135 mg/dL — ABNORMAL HIGH (ref 70–99)
Potassium: 4.1 mEq/L (ref 3.5–5.1)
Sodium: 138 mEq/L (ref 135–145)

## 2011-06-03 LAB — PROTIME-INR: Prothrombin Time: 24.1 seconds — ABNORMAL HIGH (ref 11.6–15.2)

## 2011-06-03 MED ORDER — IRON DEXTRAN 50 MG/ML IJ SOLN
1000.0000 mg | Freq: Once | INTRAMUSCULAR | Status: AC
  Administered 2011-06-03: 1000 mg via INTRAVENOUS
  Filled 2011-06-03 (×2): qty 20

## 2011-06-03 MED ORDER — SODIUM CHLORIDE 0.9 % IV SOLN
25.0000 mg | Freq: Once | INTRAVENOUS | Status: AC
  Administered 2011-06-03: 25 mg via INTRAVENOUS
  Filled 2011-06-03: qty 0.5

## 2011-06-03 NOTE — Progress Notes (Signed)
Pt had 8 beats of VTach.  Non sustain.   Donnamarie Poag  PA notified,   No new order.  Amanda Pea, RN

## 2011-06-03 NOTE — Progress Notes (Signed)
Pharmacy Consult - IV Iron  Asked to dose IV iron to target HgB of 10 Current HgB = 8.2  Total Iron deficit = 1000 mg  Plan: 1) Iron test dose of 25 mg 2) If no reactions from test dose, then  1 dose of 1000 mg  Thank you.  Okey Regal, PharmD

## 2011-06-03 NOTE — Evaluation (Signed)
Physical Therapy Evaluation Patient Details Name: [name] MRN: [id number] DOB: 1922/11/29 Today's Date: 06/03/2011  Problem List:  Patient Active Problem List  Diagnoses  . Pacemaker  . Edema  . Hypertension  . Atrial fibrillation  . IBS (irritable bowel syndrome)  . Osteoporosis  . Atrial flutter with rapid ventricular response  . Ovarian cancer  . Diastolic CHF, acute on chronic  . Anemia  . Cancer of sigmoid colon s/p sigmoid colectomy for T3N0 lesion on 1/18  . Pneumonia    Past Medical History:  Past Medical History  Diagnosis Date  . Diastolic heart failure   . Ovarian cancer   . Ovarian cancer   . Hypertension   . Atrial fibrillation   . Tachy-brady syndrome   . CHF (congestive heart failure)     diastolic heart failure  . Colon cancer   . Ovarian cancer   . Pacemaker 2008  . Arthritis   . Shortness of breath   . Pneumonia   . Anemia    Past Surgical History:  Past Surgical History  Procedure Date  . Abdominal hysterectomy   . Oophorectomy   . Colonoscopy 03/30/2011    Procedure: COLONOSCOPY;  Surgeon: Barrie Folk, MD;  Location: [facility] ENDOSCOPY;  Service: Endoscopy;  Laterality: N/A;  . Esophagogastroduodenoscopy 03/30/2011    Procedure: ESOPHAGOGASTRODUODENOSCOPY (EGD);  Surgeon: Barrie Folk, MD;  Location: [facility] ENDOSCOPY;  Service: Endoscopy;  Laterality: N/A;  . Colon resection 04/03/2011    Procedure: COLON RESECTION LAPAROSCOPIC;  Surgeon: Atilano Ina, MD;  Location: [facility] OR;  Service: General;  Laterality: N/A;  Laparoscopic, turned open sigmoid colon resection, lysis of adhesions x 2.5 hours, rigid proctoscopy.  . Pacemaker placement   . Insert / replace / remove pacemaker     PT Assessment/Plan/Recommendation Clinical Impression Statement: Pt is readmitted for Diastolic CHF, acute on chronic and a-fib with recent multiple admissions to hospital due to colon resection and subsequent GI infection.  Pt reports spending 5-6 weeks at short-term SNF  and was home for ~10 days prior to readmission. Based on the limited balance assessment we could complete today (due to fatigue and need for restroom), anticipate pt will be ablet to return home with HHPT and continued support of many family members and friends. PT Recommendation/Assessment: Patient will need skilled PT in the acute care venue PT Problem List: Decreased strength;Decreased range of motion;Decreased activity tolerance;Decreased mobility;Cardiopulmonary status limiting activity Problem List Comments: Pt with very wet cough and "wheezy" respirations despite SaO2 WNL on 2L O2. Barriers to Discharge: Decreased caregiver support (? if family can provide 24/7 assist if needed) PT Therapy Diagnosis : Difficulty walking;Generalized weakness PT Frequency: Min 3X/week PT Treatment/Interventions: DME instruction;Gait training;Functional mobility training;Therapeutic activities;Therapeutic exercise;Patient/family education Recommendations for Other Services: OT consult Follow Up Recommendations: Home health PT;Supervision - Intermittent Equipment Recommended: None recommended by PT PT Goals  Acute Rehab PT Goals PT Goal Formulation: With patient Time For Goal Achievement: 7 days Pt will go Sit to Stand: with modified independence PT Goal: Sit to Stand - Progress: Goal set today Pt will go Stand to Sit: with modified independence PT Goal: Stand to Sit - Progress: Goal set today Pt will Transfer Bed to Chair/Chair to Bed: with modified independence PT Transfer Goal: Bed to Chair/Chair to Bed - Progress: Goal set today Pt will Ambulate: 51 - 150 feet;with modified independence;with least restrictive assistive device;Other (comment) (SaO2 >92 %) PT Goal: Ambulate - Progress: Goal set today Pt will Perform Home  Exercise Program: Independently PT Goal: Perform Home Exercise Program - Progress: Goal set today  PT Evaluation Precautions/Restrictions  Precautions Precaution Comments:  none Restrictions Weight Bearing Restrictions: No Prior Functioning  Home Living Lives With: Alone Receives Help From: Family;Personal care attendant (2 sons/wives, daughters, neighbors, friends) Type of Home: House Home Layout: One level Home Access: Ramped entrance Bathroom Shower/Tub: Engineer, manufacturing systems: Handicapped height Home Adaptive Equipment: Straight cane;Quad cane;Bedside commode/3-in-1;Grab bars around toilet;Shower chair with back Prior Function Level of Independence: Independent with gait;Requires assistive device for independence;Needs assistance with ADLs Driving: No (not since her surgery) Vocation: Retired Comments: uses straight cane all the time; has been doing sponge bath for last several weeks with assist of HH aide; prior to recent illnesses was driving to get her own groceries (has been in/out of hospitial and [facility] since 03/08/11) Cognition Cognition Arousal/Alertness: Awake/alert Overall Cognitive Status: Appears within functional limits for tasks assessed Orientation Level: Oriented to person;Oriented to place;Oriented to situation;Disoriented to time (incorrect day/date) Sensation/Coordination Sensation Light Touch: Impaired Detail Light Touch Impaired Details: Impaired RUE;Impaired LUE;Impaired RLE;Impaired LLE Additional Comments: pt reports numbness bil hands and feet s/p chemo 10+ yrs ago Extremity Assessment RUE Assessment RUE Assessment: Within Functional Limits LUE Assessment LUE Assessment: Within Functional Limits RLE Assessment RLE Assessment: Exceptions to [facility] RLE AROM (degrees) RLE Overall AROM Comments: WFL x ankle DF to neutral only with tight heel cord RLE Strength RLE Overall Strength Comments: knee extension 4/5; knee flexion 3+/5 LLE Assessment LLE Assessment: Exceptions to WFL LLE AROM (degrees) LLE Overall AROM Comments: WFL x ankle DF to neutral only with tight heel cord LLE Strength LLE Overall Strength  Comments: knee extension 4+/5, knee flexion 4/5 Mobility (including Balance) Bed Mobility Bed Mobility: Yes Supine to Sit: 6: Modified independent (Device/Increase time);HOB elevated (Comment degrees) (HOB 20) Sitting - Scoot to Edge of Bed: 7: Independent Sit to Supine: 7: Independent;HOB flat Transfers Transfers: Yes Sit to Stand: 5: Supervision;With upper extremity assist;From bed;From chair/3-in-1;With armrests Sit to Stand Details (indicate cue type and reason): Supervision for safety and due to lines Stand to Sit: 5: Supervision;To bed;To chair/3-in-1;With upper extremity assist;With armrests Stand to Sit Details: Supervision for safety and due to lines Stand Pivot Transfers: 5: Supervision Stand Pivot Transfer Details (indicate cue type and reason): to/from [facility] Ambulation/Gait Ambulation/Gait: No (pt fatigued after transfers and slightly "wheezy" c resp)  Posture/Postural Control Posture/Postural Control: No significant limitations Balance Balance Assessed: Yes Static Sitting Balance Static Sitting - Balance Support: No upper extremity supported;Feet unsupported Static Sitting - Level of Assistance: 7: Independent Static Sitting - Comment/# of Minutes: able to weight shift and perform LE ROM and strength testing with no LOB Static Standing Balance Static Standing - Balance Support: No upper extremity supported Static Standing - Level of Assistance: 5: Stand by assistance (further balance deferred due to need for BSC and fatigue) Exercise    End of Session PT - End of Session Equipment Utilized During Treatment: Gait belt Activity Tolerance: Patient limited by fatigue (pulmonary and lack of sleep/general fatigue) Patient left: in bed;with call bell in reach;with family/visitor present General Behavior During Session: [facility] for tasks performed  Jovanni Eckhart 06/03/2011, 11:08 AM  Pager [phone]

## 2011-06-03 NOTE — Progress Notes (Signed)
ANTIBIOTIC CONSULT NOTE - INITIAL  Pharmacy Consult for Vanco, azactam, coumadin Indication: pneumonia/afib  Allergies  Allergen Reactions  . Lasix (Furosemide) Swelling  . Penicillins Swelling    Mouth swelling and redness  . Sulfa Antibiotics Rash    Patient Measurements: Height: 5\' 6"  (167.6 cm) Weight: 182 lb 3.2 oz (82.645 kg) (b scale) IBW/kg (Calculated) : 59.3  Adjusted Body Weight: 82  Vital Signs: Temp: 98.6 F (37 C) (03/20 0945) Temp src: Oral (03/20 0945) BP: 125/72 mmHg (03/20 0945) Pulse Rate: 68  (03/20 0945) Intake/Output from previous day: 03/19 0701 - 03/20 0700 In: 835 [P.O.:480; I.V.:3; IV Piggyback:352] Out: 1550 [Urine:1550] Intake/Output from this shift: Total I/O In: 123 [P.O.:120; I.V.:3] Out: -   Labs:  Basename 06/03/11 0121 06/02/11 0945  WBC 8.2 10.3  HGB 8.2* 8.4*  PLT 299 345  LABCREA -- --  CREATININE 0.76 0.71   Estimated Creatinine Clearance: 54.7 ml/min (by C-G formula based on Cr of 0.76). No results found for this basename: VANCOTROUGH:2,VANCOPEAK:2,VANCORANDOM:2,GENTTROUGH:2,GENTPEAK:2,GENTRANDOM:2,TOBRATROUGH:2,TOBRAPEAK:2,TOBRARND:2,AMIKACINPEAK:2,AMIKACINTROU:2,AMIKACIN:2, in the last 72 hours   Microbiology: Recent Results (from the past 720 hour(s))  URINE CULTURE     Status: Normal   Collection Time   06/02/11 10:00 AM      Component Value Range Status Comment   Specimen Description URINE, CATHETERIZED   Final    Special Requests NONE   Final    Culture  Setup Time [id number]   Final    Colony Count NO GROWTH   Final    Culture NO GROWTH   Final    Report Status 06/03/2011 FINAL   Final   CULTURE, SPUTUM-ASSESSMENT     Status: Normal   Collection Time   06/02/11  5:13 PM      Component Value Range Status Comment   Specimen Description SPUTUM   Final    Special Requests NONE   Final    Sputum evaluation     Final    Value: THIS SPECIMEN IS ACCEPTABLE. RESPIRATORY CULTURE REPORT TO FOLLOW.   Report Status  06/02/2011 FINAL   Final   CULTURE, RESPIRATORY     Status: Normal (Preliminary result)   Collection Time   06/02/11  5:13 PM      Component Value Range Status Comment   Specimen Description SPUTUM   Final    Special Requests NONE   Final    Gram Stain     Final    Value: FEW WBC PRESENT,BOTH PMN AND MONONUCLEAR     RARE SQUAMOUS EPITHELIAL CELLS PRESENT     RARE GRAM POSITIVE COCCI IN PAIRS   Culture PENDING   Incomplete    Report Status PENDING   Incomplete     Medical History: Past Medical History  Diagnosis Date  . Diastolic heart failure   . Ovarian cancer   . Ovarian cancer   . Hypertension   . Atrial fibrillation   . Tachy-brady syndrome   . CHF (congestive heart failure)     diastolic heart failure  . Colon cancer   . Ovarian cancer   . Pacemaker 2008  . Arthritis   . Shortness of breath   . Pneumonia   . Anemia     Medications:  Med rec pending  Assessment: Patient admitted for CHF and possible PNA. Remains afebrile and WBC is now normal. Started on empiric vanc/aztreonam/levaquin. Renal function remains stable.   Cardiology: HF, HTN, Afib, tachy/brady. Patient takes Coumadin at home 3mg  daily. INR 2.12 today   Goal of  Therapy:  Vancomycin trough level 15-20 mcg/ml  Plan:  Vancomycin 750mg  IV q12h. Aztreonam 1g IV q8 Coumadin 3mg  qday INR MWF

## 2011-06-03 NOTE — Progress Notes (Signed)
SUBJECTIVE:  Feels better.  No palpitations despite several short runs of SVT during the night.  No bleeding that she has noticed.  OBJECTIVE:   Vitals:   Filed Vitals:   06/03/11 0945 06/03/11 1030 06/03/11 1357 06/03/11 1430  BP: 125/72  112/74   Pulse: 68 67 64   Temp: 98.6 F (37 C)  98.2 F (36.8 C)   TempSrc: Oral  Oral   Resp: 20  20   Height:      Weight:      SpO2: 93% 96% 100% 97%   I&O's:   Intake/Output Summary (Last 24 hours) at 06/03/11 1443 Last data filed at 06/03/11 1403  Gross per 24 hour  Intake   1218 ml  Output   2600 ml  Net  -1382 ml   TELEMETRY: Reviewed telemetry pt in a paced rhythm:     PHYSICAL EXAM General: Well developed, well nourished, in no acute distress Head: Marland Kitchen   Normal cephalic and atraumatic  Lungs:   Clear bilaterally to auscultation and percussion. Heart:   HRRR S1 S2 Abdomen:abdomen soft and non-tender Msk:  . Normal strength and tone for age. Extremities:   No  edema.   Neuro: Alert and oriented X 3. Psych:  Good affect, responds appropriately   LABS: Basic Metabolic Panel:  Basename 06/03/11 0121 06/02/11 0945  NA 138 137  K 4.1 4.2  CL 101 102  CO2 28 27  GLUCOSE 135* 194*  BUN 21 23  CREATININE 0.76 0.71  CALCIUM 8.7 8.8  MG -- --  PHOS -- --   Liver Function Tests: No results found for this basename: AST:2,ALT:2,ALKPHOS:2,BILITOT:2,PROT:2,ALBUMIN:2 in the last 72 hours No results found for this basename: LIPASE:2,AMYLASE:2 in the last 72 hours CBC:  Basename 06/03/11 0121 06/02/11 0945  WBC 8.2 10.3  NEUTROABS -- 8.4*  HGB 8.2* 8.4*  HCT 27.1* 28.0*  MCV 78.1 78.7  PLT 299 345   Cardiac Enzymes:  Basename 06/03/11 0830 06/03/11 0120 06/02/11 1904  CKTOTAL 50 48 49  CKMB 2.7 2.7 2.8  CKMBINDEX -- -- --  TROPONINI <0.30 <0.30 <0.30   BNP: No components found with this basename: POCBNP:3 D-Dimer:  Basename 06/02/11 0945  DDIMER <0.22   Hemoglobin A1C: No results found for this basename:  HGBA1C in the last 72 hours Fasting Lipid Panel: No results found for this basename: CHOL,HDL,LDLCALC,TRIG,CHOLHDL,LDLDIRECT in the last 72 hours Thyroid Function Tests: No results found for this basename: TSH,T4TOTAL,FREET3,T3FREE,THYROIDAB in the last 72 hours Anemia Panel:  Basename 06/02/11 1453  VITAMINB12 420  FOLATE 17.4  FERRITIN 19  TIBC 368  IRON 18*  RETICCTPCT 1.9   Coag Panel:   Lab Results  Component Value Date   INR 2.12* 06/03/2011   INR 2.09* 06/02/2011   INR 1.70* 04/13/2011    RADIOLOGY: Dg Chest Port 1 View  06/02/2011  *RADIOLOGY REPORT*  Clinical Data: Chest tightness.  Shortness of breath.  Cough. Pacemaker.  Hypertension.  PORTABLE CHEST - 1 VIEW  Comparison: 03/31/2011  Findings: Pulmonary vascular indistinctness noted with interstitial accentuation and faint right infrahilar and left lower lobe airspace opacity.  Mild cardiomegaly noted with atherosclerotic calcification of the aortic arch.  Dual lead pacer remains in place.  IMPRESSION:  1.  Cardiomegaly with vascular indistinctness and interstitial opacity favoring interstitial pulmonary edema.  There may be faint airspace edema in the right infrahilar and left lower lobe regions. Superimposed pneumonia is not completely excluded.  Original Report Authenticated By: Dellia Cloud, M.D.  ASSESSMENT: CHF, acute systolic  And diastolic.  Newly decreased LVEF and moderate mitral regurgitation by echocardiogram.  Wall motion abnormality suspicious for nonischemic Takatsubo cardiomyopathy.  Cannot assess ECG findings due to paced rhythm.  CHF sx likely in part also to anemia.    PLAN:  1) Need to find source of anemia.  Had a GI w/u a few months ago showing colon CA.    2) Medical therapy for decreased LVEF.  Already on ACE-I. Consider adding beta blocker when CHF resolves.  Cardiac enxzymes completely negative so I do not think this cardiomyopathy is from a recent ischemic event.  Would reassess LV  function in a few weeks and see if LV has improved.  No symptoms of ischemia. Continue diuresis.  Corky Crafts., MD  06/03/2011  2:43 PM

## 2011-06-03 NOTE — Progress Notes (Signed)
0900 Cardiac Rehab We received order. Noted activity order is for BR with BRP. Please advise when you would like pt to begin ambulating.

## 2011-06-03 NOTE — Progress Notes (Signed)
Subjective: Pt feels that she may have had some anxiety over all the medications that she's being given. States that she is reluctant to take all these antibiotics as antibiotics have caused her significant diarrhea and upset stomach in the past. She denies any fever or chills and states that her cough has been minimal and non-productive.  Interval History: Pt has multiple runs of SVT overnight.  Objective: Filed Vitals:   06/03/11 0945 06/03/11 1030 06/03/11 1357 06/03/11 1430  BP: 125/72  112/74   Pulse: 68 67 64   Temp: 98.6 F (37 C)  98.2 F (36.8 C)   TempSrc: Oral  Oral   Resp: 20  20   Height:      Weight:      SpO2: 93% 96% 100% 97%   Weight change:   Intake/Output Summary (Last 24 hours) at 06/03/11 1627 Last data filed at 06/03/11 1546  Gross per 24 hour  Intake   1566 ml  Output   2700 ml  Net  -1134 ml    General: Alert, awake, oriented x3, in no acute distress.  HEENT: [place]/AT PEERL, EOMI Neck: Trachea midline,  no masses, no thyromegal,y no JVD, no carotid bruit OROPHARYNX:  Moist, No exudate/ erythema/lesions.  Heart: Regular rate and rhythm, without murmurs, rubs, gallops, PMI non-displaced, no heaves or thrills on palpation.  Lungs: Clear to auscultation, no wheezing or rhonchi noted. No increased vocal fremitus resonant to percussion  Abdomen: Soft, nontender, nondistended, positive bowel sounds, no masses no hepatosplenomegaly noted..  Neuro: No focal neurological deficits noted. Musculoskeletal: No warm swelling or erythema around joints, no spinal tenderness noted. Psychiatric: Patient alert and oriented x3, good insight and cognition, good recent to remote recall.       Lab Results:  Basename 06/03/11 0121 06/02/11 0945  NA 138 137  K 4.1 4.2  CL 101 102  CO2 28 27  GLUCOSE 135* 194*  BUN 21 23  CREATININE 0.76 0.71  CALCIUM 8.7 8.8  MG -- --  PHOS -- --   No results found for this basename:  AST:2,ALT:2,ALKPHOS:2,BILITOT:2,PROT:2,ALBUMIN:2 in the last 72 hours No results found for this basename: LIPASE:2,AMYLASE:2 in the last 72 hours  Basename 06/03/11 0121 06/02/11 0945  WBC 8.2 10.3  NEUTROABS -- 8.4*  HGB 8.2* 8.4*  HCT 27.1* 28.0*  MCV 78.1 78.7  PLT 299 345    Basename 06/03/11 0830 06/03/11 0120 06/02/11 1904  CKTOTAL 50 48 49  CKMB 2.7 2.7 2.8  CKMBINDEX -- -- --  TROPONINI <0.30 <0.30 <0.30   No components found with this basename: POCBNP:3  Basename 06/02/11 0945  DDIMER <0.22   No results found for this basename: HGBA1C:2 in the last 72 hours No results found for this basename: CHOL:2,HDL:2,LDLCALC:2,TRIG:2,CHOLHDL:2,LDLDIRECT:2 in the last 72 hours No results found for this basename: TSH,T4TOTAL,FREET3,T3FREE,THYROIDAB in the last 72 hours  Basename 06/02/11 1453  VITAMINB12 420  FOLATE 17.4  FERRITIN 19  TIBC 368  IRON 18*  RETICCTPCT 1.9    Micro Results: Recent Results (from the past 240 hour(s))  URINE CULTURE     Status: Normal   Collection Time   06/02/11 10:00 AM      Component Value Range Status Comment   Specimen Description URINE, CATHETERIZED   Final    Special Requests NONE   Final    Culture  Setup Time [id number]   Final    Colony Count NO GROWTH   Final    Culture NO GROWTH   Final  Report Status 06/03/2011 FINAL   Final   CULTURE, SPUTUM-ASSESSMENT     Status: Normal   Collection Time   06/02/11  5:13 PM      Component Value Range Status Comment   Specimen Description SPUTUM   Final    Special Requests NONE   Final    Sputum evaluation     Final    Value: THIS SPECIMEN IS ACCEPTABLE. RESPIRATORY CULTURE REPORT TO FOLLOW.   Report Status 06/02/2011 FINAL   Final   CULTURE, RESPIRATORY     Status: Normal (Preliminary result)   Collection Time   06/02/11  5:13 PM      Component Value Range Status Comment   Specimen Description SPUTUM   Final    Special Requests NONE   Final    Gram Stain     Final    Value: FEW WBC  PRESENT,BOTH PMN AND MONONUCLEAR     RARE SQUAMOUS EPITHELIAL CELLS PRESENT     RARE GRAM POSITIVE COCCI IN PAIRS   Culture PENDING   Incomplete    Report Status PENDING   Incomplete     Studies/Results: Dg Chest Port 1 View  06/02/2011  *RADIOLOGY REPORT*  Clinical Data: Chest tightness.  Shortness of breath.  Cough. Pacemaker.  Hypertension.  PORTABLE CHEST - 1 VIEW  Comparison: 03/31/2011  Findings: Pulmonary vascular indistinctness noted with interstitial accentuation and faint right infrahilar and left lower lobe airspace opacity.  Mild cardiomegaly noted with atherosclerotic calcification of the aortic arch.  Dual lead pacer remains in place.  IMPRESSION:  1.  Cardiomegaly with vascular indistinctness and interstitial opacity favoring interstitial pulmonary edema.  There may be faint airspace edema in the right infrahilar and left lower lobe regions. Superimposed pneumonia is not completely excluded.  Original Report Authenticated By: Dellia Cloud, M.D.    Medications: I have reviewed the patient's current medications. Scheduled Meds:   . bumetanide  1 mg Intravenous Daily  . calcium-vitamin D  1 tablet Oral BID WC  . diltiazem  240 mg Oral Daily  . feeding supplement  237 mL Oral TID WC  . feeding supplement  30 mL Oral BID WC  . ipratropium  0.5 mg Nebulization Q6H  . levalbuterol  0.63 mg Nebulization Q6H  . pantoprazole  40 mg Oral Q1200  . polysaccharide iron  150 mg Oral Q breakfast  . ramipril  10 mg Oral Daily  . sodium chloride  3 mL Intravenous Q12H  . sotalol  160 mg Oral Q12H  . warfarin  3 mg Oral q1800  . Warfarin - Pharmacist Dosing Inpatient   Does not apply q1800  . DISCONTD: aztreonam  1 g Intravenous Q8H  . DISCONTD: levofloxacin (LEVAQUIN) IV  750 mg Intravenous Q24H  . DISCONTD: vancomycin  750 mg Intravenous Q12H   Continuous Infusions:  PRN Meds:.sodium chloride, acetaminophen, guaiFENesin-codeine, ondansetron (ZOFRAN) IV, sodium  chloride Assessment/Plan: Patient Active Hospital Problem List: Diastolic CHF, acute on chronic (03/27/2011)   Assessment: ECHO results from this admission pending however ECHO in 1/13 showed normal LVF. Pt seems to be improving with diuresis. Will continue.   Hypertension (05/31/2010)   Assessment: BP well controlled   Atrial fibrillation (05/31/2010)   Assessment: Pt has a paced rhythm which is controlled. On coumadin at therapeutic INR.   Anemia (03/27/2011)   Assessment: Pt has an Iron deficient anemia which is likely related to use of coumadin in the setting of recent colonic malignancy and surgery. Will send all stool  for guiac. Also give IV iron and continue with oral iron.   Cancer of sigmoid colon s/p sigmoid colectomy for T3N0 lesion on 1/18 (04/08/2011)   Assessment: Pt to F/U with general surgery    Pneumonia (06/02/2011)   Assessment: Pt has no clinical signs and history that is pathognomonic of pneumonia. Pt wants to hold antibiotics for now. In light of pt's clinical presentation and course so far, I will D/C antibiotics and observe patient clinically while continuing diuresis.  Discussed all of the above with patient and her daughter-in-law.   LOS: 1 day

## 2011-06-03 NOTE — Progress Notes (Signed)
During change of shift, patient reports SOB and is diaphoretic, patient VS are stable. MD notified and orders given for labs; will continue to monitor patient___________________________________________________D. Manson Passey RN

## 2011-06-04 ENCOUNTER — Encounter (INDEPENDENT_AMBULATORY_CARE_PROVIDER_SITE_OTHER): Payer: Medicare Other | Admitting: General Surgery

## 2011-06-04 LAB — BASIC METABOLIC PANEL
BUN: 25 mg/dL — ABNORMAL HIGH (ref 6–23)
CO2: 27 mEq/L (ref 19–32)
Calcium: 8.8 mg/dL (ref 8.4–10.5)
Chloride: 100 mEq/L (ref 96–112)
Creatinine, Ser: 0.71 mg/dL (ref 0.50–1.10)
Glucose, Bld: 193 mg/dL — ABNORMAL HIGH (ref 70–99)

## 2011-06-04 LAB — CBC
HCT: 25.9 % — ABNORMAL LOW (ref 36.0–46.0)
MCH: 24.2 pg — ABNORMAL LOW (ref 26.0–34.0)
MCHC: 30.9 g/dL (ref 30.0–36.0)
MCV: 78.5 fL (ref 78.0–100.0)
Platelets: 284 10*3/uL (ref 150–400)
RDW: 19.3 % — ABNORMAL HIGH (ref 11.5–15.5)

## 2011-06-04 LAB — LEGIONELLA ANTIGEN, URINE

## 2011-06-04 MED ORDER — BUMETANIDE 0.25 MG/ML IJ SOLN
1.0000 mg | Freq: Two times a day (BID) | INTRAMUSCULAR | Status: DC
  Administered 2011-06-04: 23:00:00 via INTRAVENOUS
  Administered 2011-06-05 – 2011-06-07 (×6): 1 mg via INTRAVENOUS
  Filled 2011-06-04 (×8): qty 4

## 2011-06-04 MED ORDER — DILTIAZEM HCL ER COATED BEADS 300 MG PO CP24
300.0000 mg | ORAL_CAPSULE | Freq: Every day | ORAL | Status: DC
  Administered 2011-06-05 – 2011-06-10 (×6): 300 mg via ORAL
  Filled 2011-06-04 (×6): qty 1

## 2011-06-04 MED ORDER — POTASSIUM CHLORIDE CRYS ER 20 MEQ PO TBCR
40.0000 meq | EXTENDED_RELEASE_TABLET | Freq: Every day | ORAL | Status: DC
  Administered 2011-06-04 – 2011-06-10 (×7): 40 meq via ORAL
  Filled 2011-06-04 (×7): qty 2

## 2011-06-04 NOTE — Progress Notes (Signed)
Physical Therapy Treatment Patient Details Name: [name] MRN: [id number] DOB: 11-19-1922 Today's Date: 06/04/2011  PT Assessment/Plan  PT - Assessment/Plan Comments on Treatment Session: Making good improvements.  SaO2 remained >=93% on RA and pt's gait, although guarded, appeared safe using rails/furniture (like pt does at home). PT Plan: Discharge plan remains appropriate Equipment Recommended: None recommended by PT PT Goals  Acute Rehab PT Goals PT Goal: Sit to Stand - Progress: Progressing toward goal PT Goal: Stand to Sit - Progress: Progressing toward goal PT Transfer Goal: Bed to Chair/Chair to Bed - Progress: Progressing toward goal PT Goal: Ambulate - Progress: Progressing toward goal  PT Treatment Precautions/Restrictions  Precautions Precaution Comments: none Restrictions Weight Bearing Restrictions: No Mobility (including Balance) Bed Mobility Bed Mobility: No Transfers Transfers: Yes Sit to Stand: 5: Supervision Sit to Stand Details (indicate cue type and reason): reinforced useing armests Stand to Sit: 5: Supervision Ambulation/Gait Ambulation/Gait: Yes Ambulation/Gait Assistance: 5: Supervision Ambulation/Gait Assistance Details (indicate cue type and reason): with and without RW.  Observed pt "cruising" from surface to surface as she does at home.  Gait safe, but guarded with flexed posture. Ambulation Distance (Feet): 170 Feet (80 with RW and the rest using rails and furniture.) Assistive device: Rolling walker;None Gait Pattern: Within Functional Limits;Decreased step length - right;Decreased step length - left;Decreased stride length Stairs: No  Posture/Postural Control Posture/Postural Control: No significant limitations Static Standing Balance Static Standing - Balance Support: No upper extremity supported Static Standing - Level of Assistance: 5: Stand by assistance Exercise    End of Session PT - End of Session Activity Tolerance: Patient  limited by fatigue Patient left: in chair;with call bell in reach;with family/visitor present Nurse Communication: Mobility status for ambulation General Behavior During Session: [facility] for tasks performed Cognition: [facility] for tasks performed  Jodey Burbano, Eliseo Gum 06/04/2011, 11:27 AM  06/04/2011  Ashe Bing, PT [phone] (pager)

## 2011-06-04 NOTE — Progress Notes (Signed)
Subjective: Pt feels much better today. She denies any fever or chills and states that her cough has been minimal and non-productive.  Objective: Filed Vitals:   06/04/11 0259 06/04/11 0500 06/04/11 1015 06/04/11 1432  BP:  127/78  100/59  Pulse:  65  66  Temp:  98.4 F (36.9 C)  97.9 F (36.6 C)  TempSrc:  Oral    Resp:  18  18  Height:      Weight:  85 kg (187 lb 6.3 oz)    SpO2: 90% 95% 93% 95%   Weight change: 3.183 kg (7 lb 0.3 oz)  Intake/Output Summary (Last 24 hours) at 06/04/11 2054 Last data filed at 06/04/11 1808  Gross per 24 hour  Intake   1517 ml  Output   1401 ml  Net    116 ml    General: Alert, awake, oriented x3, in no acute distress.  HEENT: [place]/AT PEERL, EOMI Neck: Trachea midline,  no masses, no thyromegal,y no JVD, no carotid bruit OROPHARYNX:  Moist, No exudate/ erythema/lesions.  Heart: Regular rate and rhythm, without murmurs, rubs, gallops, PMI non-displaced, no heaves or thrills on palpation.  Lungs: Clear to auscultation, no wheezing or rhonchi noted. No increased vocal fremitus resonant to percussion  Abdomen: Soft, nontender, nondistended, positive bowel sounds, no masses no hepatosplenomegaly noted..    Lab Results:  Basename 06/04/11 0659 06/03/11 0121  NA 136 138  K 3.6 4.1  CL 100 101  CO2 27 28  GLUCOSE 193* 135*  BUN 25* 21  CREATININE 0.71 0.76  CALCIUM 8.8 8.7  MG -- --  PHOS -- --   No results found for this basename: AST:2,ALT:2,ALKPHOS:2,BILITOT:2,PROT:2,ALBUMIN:2 in the last 72 hours No results found for this basename: LIPASE:2,AMYLASE:2 in the last 72 hours  Basename 06/04/11 0659 06/03/11 0121 06/02/11 0945  WBC 6.4 8.2 --  NEUTROABS -- -- 8.4*  HGB 8.0* 8.2* --  HCT 25.9* 27.1* --  MCV 78.5 78.1 --  PLT 284 299 --    Basename 06/03/11 0830 06/03/11 0120 06/02/11 1904  CKTOTAL 50 48 49  CKMB 2.7 2.7 2.8  CKMBINDEX -- -- --  TROPONINI <0.30 <0.30 <0.30   No components found with this basename:  POCBNP:3  Basename 06/02/11 0945  DDIMER <0.22   No results found for this basename: HGBA1C:2 in the last 72 hours No results found for this basename: CHOL:2,HDL:2,LDLCALC:2,TRIG:2,CHOLHDL:2,LDLDIRECT:2 in the last 72 hours No results found for this basename: TSH,T4TOTAL,FREET3,T3FREE,THYROIDAB in the last 72 hours  Basename 06/02/11 1453  VITAMINB12 420  FOLATE 17.4  FERRITIN 19  TIBC 368  IRON 18*  RETICCTPCT 1.9    Micro Results: Recent Results (from the past 240 hour(s))  URINE CULTURE     Status: Normal   Collection Time   06/02/11 10:00 AM      Component Value Range Status Comment   Specimen Description URINE, CATHETERIZED   Final    Special Requests NONE   Final    Culture  Setup Time [id number]   Final    Colony Count NO GROWTH   Final    Culture NO GROWTH   Final    Report Status 06/03/2011 FINAL   Final   CULTURE, SPUTUM-ASSESSMENT     Status: Normal   Collection Time   06/02/11  5:13 PM      Component Value Range Status Comment   Specimen Description SPUTUM   Final    Special Requests NONE   Final    Sputum evaluation  Final    Value: THIS SPECIMEN IS ACCEPTABLE. RESPIRATORY CULTURE REPORT TO FOLLOW.   Report Status 06/02/2011 FINAL   Final   CULTURE, RESPIRATORY     Status: Normal (Preliminary result)   Collection Time   06/02/11  5:13 PM      Component Value Range Status Comment   Specimen Description SPUTUM   Final    Special Requests NONE   Final    Gram Stain     Final    Value: FEW WBC PRESENT,BOTH PMN AND MONONUCLEAR     RARE SQUAMOUS EPITHELIAL CELLS PRESENT     RARE GRAM POSITIVE COCCI IN PAIRS   Culture NORMAL OROPHARYNGEAL FLORA   Final    Report Status PENDING   Incomplete     Studies/Results: Dg Chest Port 1 View  06/02/2011  *RADIOLOGY REPORT*  Clinical Data: Chest tightness.  Shortness of breath.  Cough. Pacemaker.  Hypertension.  PORTABLE CHEST - 1 VIEW  Comparison: 03/31/2011  Findings: Pulmonary vascular indistinctness noted with  interstitial accentuation and faint right infrahilar and left lower lobe airspace opacity.  Mild cardiomegaly noted with atherosclerotic calcification of the aortic arch.  Dual lead pacer remains in place.  IMPRESSION:  1.  Cardiomegaly with vascular indistinctness and interstitial opacity favoring interstitial pulmonary edema.  There may be faint airspace edema in the right infrahilar and left lower lobe regions. Superimposed pneumonia is not completely excluded.  Original Report Authenticated By: Dellia Cloud, M.D.    Medications: I have reviewed the patient's current medications. Scheduled Meds:    . bumetanide  1 mg Intravenous BID BM  . calcium-vitamin D  1 tablet Oral BID WC  . diltiazem  300 mg Oral Daily  . feeding supplement  237 mL Oral TID WC  . feeding supplement  30 mL Oral BID WC  . ipratropium  0.5 mg Nebulization Q6H  . iron dextran (INFED/DEXFERRUM) infusion  1,000 mg Intravenous Once  . levalbuterol  0.63 mg Nebulization Q6H  . pantoprazole  40 mg Oral Q1200  . polysaccharide iron  150 mg Oral Q breakfast  . potassium chloride  40 mEq Oral Daily  . ramipril  10 mg Oral Daily  . sodium chloride  3 mL Intravenous Q12H  . sotalol  160 mg Oral Q12H  . warfarin  3 mg Oral q1800  . Warfarin - Pharmacist Dosing Inpatient   Does not apply q1800  . DISCONTD: bumetanide  1 mg Intravenous Daily  . DISCONTD: diltiazem  240 mg Oral Daily   Continuous Infusions:  PRN Meds:.sodium chloride, acetaminophen, guaiFENesin-codeine, ondansetron (ZOFRAN) IV, sodium chloride Assessment/Plan: Patient Active Hospital Problem List: Diastolic CHF, acute on chronic (03/27/2011)   Assessment: Clinically compensated   Hypertension (05/31/2010)   Assessment: BP well controlled   Atrial fibrillation (05/31/2010)   Assessment: Pt has a paced rhythm which is controlled. On coumadin at therapeutic INR.   Anemia (03/27/2011)   Assessment: Pt has an Iron deficient anemia which is likely  related to use of coumadin in the setting of recent colonic malignancy and surgery. Will send all stool for guiac. Also give IV iron and continue with oral iron.   Cancer of sigmoid colon s/p sigmoid colectomy for T3N0 lesion on 1/18 (04/08/2011)   Assessment: Pt to F/U with general surgery    Pneumonia (06/02/2011)   Assessment: Pt continues to have no further clinical signs despite stopping antibiotics. Will continue to monitor.     LOS: 2 days

## 2011-06-04 NOTE — Progress Notes (Signed)
CARDIAC REHAB PHASE I   PRE:  Rate/Rhythm: 65 paced  BP:  Supine:   Sitting: 104/52  Standing:    SaO2: 97%3L   93%RA  MODE:  Ambulation: 128 ft   POST:  Rate/Rhythem: 65  BP:  Supine:   Sitting: 120/60  Standing:    SaO2: 98%hall and room 1328-1358  Pt walked 128 ft on RA with rolling walker and asst x 2. Gait fairly steady. Tired easily. Sats good on RA. Left off oxygen since sats at 98%RA. To recliner with call bell.  [name]

## 2011-06-04 NOTE — Progress Notes (Signed)
SUBJECTIVE:  Feels better.  No palpitations despite several  runs of SVT during the night.  No bleeding that she has noticed.  She decided to stop antibiotics but still has a cough.  OBJECTIVE:   Vitals:   Filed Vitals:   06/04/11 0259 06/04/11 0500 06/04/11 1015 06/04/11 1432  BP:  127/78  100/59  Pulse:  65  66  Temp:  98.4 F (36.9 C)  97.9 F (36.6 C)  TempSrc:  Oral    Resp:  18  18  Height:      Weight:  85 kg (187 lb 6.3 oz)    SpO2: 90% 95% 93% 95%   I&O's:    Intake/Output Summary (Last 24 hours) at 06/04/11 1913 Last data filed at 06/04/11 1808  Gross per 24 hour  Intake   2037 ml  Output   1401 ml  Net    636 ml   TELEMETRY: Reviewed telemetry pt in a paced rhythm:     PHYSICAL EXAM General: Well developed, well nourished, in no acute distress Head: Marland Kitchen   Normal cephalic and atraumatic  Lungs:   Clear bilaterally to auscultation and percussion. Heart:   HRRR S1 S2 Abdomen:abdomen soft and non-tender Msk:  . Normal strength and tone for age. Extremities:   No  edema.   Neuro: Alert and oriented X 3. Psych:  Good affect, responds appropriately   LABS: Basic Metabolic Panel:  Basename 06/04/11 0659 06/03/11 0121  NA 136 138  K 3.6 4.1  CL 100 101  CO2 27 28  GLUCOSE 193* 135*  BUN 25* 21  CREATININE 0.71 0.76  CALCIUM 8.8 8.7  MG -- --  PHOS -- --   Liver Function Tests: No results found for this basename: AST:2,ALT:2,ALKPHOS:2,BILITOT:2,PROT:2,ALBUMIN:2 in the last 72 hours No results found for this basename: LIPASE:2,AMYLASE:2 in the last 72 hours CBC:  Basename 06/04/11 0659 06/03/11 0121 06/02/11 0945  WBC 6.4 8.2 --  NEUTROABS -- -- 8.4*  HGB 8.0* 8.2* --  HCT 25.9* 27.1* --  MCV 78.5 78.1 --  PLT 284 299 --   Cardiac Enzymes:  Basename 06/03/11 0830 06/03/11 0120 06/02/11 1904  CKTOTAL 50 48 49  CKMB 2.7 2.7 2.8  CKMBINDEX -- -- --  TROPONINI <0.30 <0.30 <0.30   BNP: No components found with this basename:  POCBNP:3 D-Dimer:  Basename 06/02/11 0945  DDIMER <0.22   Hemoglobin A1C: No results found for this basename: HGBA1C in the last 72 hours Fasting Lipid Panel: No results found for this basename: CHOL,HDL,LDLCALC,TRIG,CHOLHDL,LDLDIRECT in the last 72 hours Thyroid Function Tests: No results found for this basename: TSH,T4TOTAL,FREET3,T3FREE,THYROIDAB in the last 72 hours Anemia Panel:  Basename 06/02/11 1453  VITAMINB12 420  FOLATE 17.4  FERRITIN 19  TIBC 368  IRON 18*  RETICCTPCT 1.9   Coag Panel:   Lab Results  Component Value Date   INR 2.12* 06/03/2011   INR 2.09* 06/02/2011   INR 1.70* 04/13/2011    RADIOLOGY: Dg Chest Port 1 View  06/02/2011  *RADIOLOGY REPORT*  Clinical Data: Chest tightness.  Shortness of breath.  Cough. Pacemaker.  Hypertension.  PORTABLE CHEST - 1 VIEW  Comparison: 03/31/2011  Findings: Pulmonary vascular indistinctness noted with interstitial accentuation and faint right infrahilar and left lower lobe airspace opacity.  Mild cardiomegaly noted with atherosclerotic calcification of the aortic arch.  Dual lead pacer remains in place.  IMPRESSION:  1.  Cardiomegaly with vascular indistinctness and interstitial opacity favoring interstitial pulmonary edema.  There may be  faint airspace edema in the right infrahilar and left lower lobe regions. Superimposed pneumonia is not completely excluded.  Original Report Authenticated By: Dellia Cloud, M.D.      ASSESSMENT: CHF, acute systolic  And diastolic.  Newly decreased LVEF and moderate mitral regurgitation by echocardiogram.  Wall motion abnormality suspicious for nonischemic Takatsubo cardiomyopathy.  Cannot assess ECG findings due to paced rhythm.  CHF sx likely in part also to anemia.    PLAN:  1) Need to find source of anemia.  Had a GI w/u a few months ago showing colon CA.    2) Medical therapy for decreased LVEF.  Already on ACE-I. Consider adding beta blocker when CHF resolves.  Cardiac enzymes  negative so I do not think this cardiomyopathy is from a recent ischemic event.  Would reassess LV function in a few weeks and see if LV has improved.  No symptoms of ischemia. Continue diuresis.  She has had positive fluid balance over the last 24 hours.  WIll increase Bumex.  Corky Crafts., MD  06/04/2011  7:13 PM

## 2011-06-05 LAB — PREPARE RBC (CROSSMATCH)

## 2011-06-05 LAB — GLUCOSE, CAPILLARY: Glucose-Capillary: 120 mg/dL — ABNORMAL HIGH (ref 70–99)

## 2011-06-05 LAB — CBC
Hemoglobin: 8 g/dL — ABNORMAL LOW (ref 12.0–15.0)
MCH: 23.7 pg — ABNORMAL LOW (ref 26.0–34.0)
MCHC: 30.1 g/dL (ref 30.0–36.0)
RDW: 19.4 % — ABNORMAL HIGH (ref 11.5–15.5)

## 2011-06-05 LAB — RETICULOCYTES
Retic Count, Absolute: 95.5 10*3/uL (ref 19.0–186.0)
Retic Ct Pct: 2.8 % (ref 0.4–3.1)

## 2011-06-05 LAB — BASIC METABOLIC PANEL
BUN: 24 mg/dL — ABNORMAL HIGH (ref 6–23)
Creatinine, Ser: 0.86 mg/dL (ref 0.50–1.10)
GFR calc Af Amer: 69 mL/min — ABNORMAL LOW (ref >90)
GFR calc non Af Amer: 59 mL/min — ABNORMAL LOW (ref >90)
Glucose, Bld: 102 mg/dL — ABNORMAL HIGH (ref 70–99)
Potassium: 3.4 mEq/L — ABNORMAL LOW (ref 3.5–5.1)

## 2011-06-05 LAB — PROTIME-INR: Prothrombin Time: 24.6 seconds — ABNORMAL HIGH (ref 11.6–15.2)

## 2011-06-05 LAB — MAGNESIUM: Magnesium: 2.1 mg/dL (ref 1.5–2.5)

## 2011-06-05 MED ORDER — IPRATROPIUM BROMIDE 0.02 % IN SOLN
0.5000 mg | Freq: Four times a day (QID) | RESPIRATORY_TRACT | Status: DC | PRN
  Administered 2011-06-08 – 2011-06-10 (×2): 0.5 mg via RESPIRATORY_TRACT
  Filled 2011-06-05: qty 2.5

## 2011-06-05 MED ORDER — POTASSIUM CHLORIDE CRYS ER 20 MEQ PO TBCR
40.0000 meq | EXTENDED_RELEASE_TABLET | ORAL | Status: AC
  Administered 2011-06-05 (×2): 40 meq via ORAL
  Filled 2011-06-05 (×2): qty 2

## 2011-06-05 MED ORDER — LEVALBUTEROL HCL 0.63 MG/3ML IN NEBU
0.6300 mg | INHALATION_SOLUTION | Freq: Four times a day (QID) | RESPIRATORY_TRACT | Status: DC | PRN
  Administered 2011-06-08 – 2011-06-10 (×2): 0.63 mg via RESPIRATORY_TRACT
  Filled 2011-06-05: qty 3

## 2011-06-05 NOTE — Progress Notes (Addendum)
Noted long episode of VT (?). Please specify ambulation for today.

## 2011-06-05 NOTE — Progress Notes (Signed)
Pt with  10 BEATS OF VT %  asymptomatic.   Benedetto Coons notified,  Will order mag for in th morning.

## 2011-06-05 NOTE — Progress Notes (Signed)
SUBJECTIVE:  Feels better.  No palpitations despite several  runs of SVT during the night.  No bleeding that she has noticed.  She decided to stop antibiotics but still has a cough.  OBJECTIVE:   Vitals:   Filed Vitals:   06/04/11 2230 06/05/11 0611 06/05/11 0736 06/05/11 1415  BP: 122/70 119/64  115/59  Pulse: 60 64  65  Temp:  97.1 F (36.2 C)  94.9 F (34.9 C)  TempSrc:  Oral  Axillary  Resp:  18  18  Height:      Weight:  81.693 kg (180 lb 1.6 oz)    SpO2:  94% 94% 95%   I&O's:    Intake/Output Summary (Last 24 hours) at 06/05/11 1826 Last data filed at 06/05/11 1529  Gross per 24 hour  Intake    560 ml  Output   2350 ml  Net  -1790 ml   TELEMETRY: Reviewed telemetry pt in a paced rhythm, some AFib with RVR:     PHYSICAL EXAM General: Well developed, well nourished, in no acute distress Head: Marland Kitchen   Normal cephalic and atraumatic  Lungs:   Clear bilaterally to auscultation and percussion. Heart:   HRRR S1 S2 Abdomen:abdomen soft and non-tender Msk:  . Normal strength and tone for age. Extremities:   No  edema.   Neuro: Alert and oriented X 3. Psych:  Good affect, responds appropriately   LABS: Basic Metabolic Panel:  Basename 06/05/11 0527 06/04/11 0659  NA 139 136  K 3.4* 3.6  CL 98 100  CO2 32 27  GLUCOSE 102* 193*  BUN 24* 25*  CREATININE 0.86 0.71  CALCIUM 9.0 8.8  MG 2.1 --  PHOS -- --   Liver Function Tests: No results found for this basename: AST:2,ALT:2,ALKPHOS:2,BILITOT:2,PROT:2,ALBUMIN:2 in the last 72 hours No results found for this basename: LIPASE:2,AMYLASE:2 in the last 72 hours CBC:  Basename 06/05/11 0527 06/04/11 0659  WBC 6.0 6.4  NEUTROABS -- --  HGB 8.0* 8.0*  HCT 26.6* 25.9*  MCV 78.7 78.5  PLT 296 284   Cardiac Enzymes:  Basename 06/03/11 0830 06/03/11 0120 06/02/11 1904  CKTOTAL 50 48 49  CKMB 2.7 2.7 2.8  CKMBINDEX -- -- --  TROPONINI <0.30 <0.30 <0.30   BNP: No components found with this basename:  POCBNP:3 D-Dimer: No results found for this basename: DDIMER:2 in the last 72 hours Hemoglobin A1C: No results found for this basename: HGBA1C in the last 72 hours Fasting Lipid Panel: No results found for this basename: CHOL,HDL,LDLCALC,TRIG,CHOLHDL,LDLDIRECT in the last 72 hours Thyroid Function Tests: No results found for this basename: TSH,T4TOTAL,FREET3,T3FREE,THYROIDAB in the last 72 hours Anemia Panel: No results found for this basename: VITAMINB12,FOLATE,FERRITIN,TIBC,IRON,RETICCTPCT in the last 72 hours Coag Panel:   Lab Results  Component Value Date   INR 2.18* 06/05/2011   INR 2.12* 06/03/2011   INR 2.09* 06/02/2011    RADIOLOGY: Dg Chest Port 1 View  06/02/2011  *RADIOLOGY REPORT*  Clinical Data: Chest tightness.  Shortness of breath.  Cough. Pacemaker.  Hypertension.  PORTABLE CHEST - 1 VIEW  Comparison: 03/31/2011  Findings: Pulmonary vascular indistinctness noted with interstitial accentuation and faint right infrahilar and left lower lobe airspace opacity.  Mild cardiomegaly noted with atherosclerotic calcification of the aortic arch.  Dual lead pacer remains in place.  IMPRESSION:  1.  Cardiomegaly with vascular indistinctness and interstitial opacity favoring interstitial pulmonary edema.  There may be faint airspace edema in the right infrahilar and left lower lobe regions. Superimposed pneumonia  is not completely excluded.  Original Report Authenticated By: Dellia Cloud, M.D.      ASSESSMENT: CHF, acute systolic  And diastolic.  Newly decreased LVEF and moderate mitral regurgitation by echocardiogram.  Wall motion abnormality suspicious for nonischemic Takatsubo cardiomyopathy.  Cannot assess ECG findings due to paced rhythm.  CHF sx likely in part also to anemia.  AFib.  PLAN:  1) Need to find source of anemia.  Had a GI w/u a few months ago showing colon CA.  Awaiting stool hemoccult result.    Will add reticulocyte count.  Patient's family is interested in a  hematology consult.  ?? Transfusion since runs of AFib are likely stimulated in part by stress from anemia.  Keep INR around 2.0.  Wean oxygen.  2) Medical therapy for decreased LVEF.  Already on ACE-I. Consider adding beta blocker when CHF resolves.  Cardiac enzymes negative so I do not think this cardiomyopathy is from a recent ischemic event.  Would reassess LV function in a few weeks and see if LV has improved.  No symptoms of ischemia. Continue diuresis.  She has had negative fluid balance with increased Bumex.  Replace K.  Corky Crafts., MD  06/05/2011  6:26 PM

## 2011-06-05 NOTE — Progress Notes (Signed)
CARDIAC REHAB PHASE I   PRE:  Rate/Rhythm: 65 Pacing  BP:  Supine:   Sitting: 120/60  Standing:    SaO2: 98 RA  MODE:  Ambulation: 192 ft   POST:  Rate/Rhythem: 69  BP:  Supine: 108/60  Sitting:   Standing:    SaO2: 92-93 RA 1435-1500 Assisted X 1 and used walker to ambulate. Gait steady with walker. She c/o of weakness and being tired from no sleep. VS stable Able to walk 192 ft. To bed after walk with call light in reach.  Beatrix Fetters

## 2011-06-05 NOTE — Progress Notes (Signed)
ANTICOAGULATION CONSULT NOTE - Follow Up Consult  Pharmacy Consult for coumadin Indication: atrial fibrillation  Vital Signs: Temp: 97.1 F (36.2 C) (03/22 0611) Temp src: Oral (03/22 0611) BP: 119/64 mmHg (03/22 0611) Pulse Rate: 64  (03/22 0611)  Labs:  [name] 06/05/11 0527 06/04/11 0659 06/03/11 0830 06/03/11 0121 06/03/11 0120 06/02/11 1904 06/02/11 0945  HGB 8.0* 8.0* -- -- -- -- --  HCT 26.6* 25.9* -- 27.1* -- -- --  PLT 296 284 -- 299 -- -- --  APTT -- -- -- -- -- -- --  LABPROT 24.6* -- -- 24.1* -- -- 23.8*  INR 2.18* -- -- 2.12* -- -- 2.09*  HEPARINUNFRC -- -- -- -- -- -- --  CREATININE 0.86 0.71 -- 0.76 -- -- --  CKTOTAL -- -- 50 -- 48 49 --  CKMB -- -- 2.7 -- 2.7 2.8 --  TROPONINI -- -- <0.30 -- <0.30 <0.30 --   Estimated Creatinine Clearance: 50.6 ml/min (by C-G formula based on Cr of 0.86).  Assessment: 88 yof on chronic coumadin for atrial fibrillation. INR today is 2.18. Only checking INRs on MWF since pt has been stable on her home dose of 3mg  daily. Pt remains anemic s/p iron dextran and daily iron supplementation. No bleeding noted.  Goal of Therapy:  INR 2-3   Plan:  1. Continue coumadin 3mg  PO daily 2. Continue to check INRs only on MWF 3. Monitor for s&s of bleeding 4. F/u anemia work-up  Amol Domanski, Drake Leach 06/05/2011,8:54 AM

## 2011-06-05 NOTE — Progress Notes (Signed)
Subjective: Patient states that she feels well today. Her only complaint is being awakened at night to have a breathing treatment. Patient feels that she can sleep through the night then she'll feel much more rested during the day. The patient 20 beat run of V. tach this morning however appear to have occurred after she received Xopenex. Patient has continued to be afebrile Objective: Filed Vitals:   06/04/11 2230 06/05/11 0611 06/05/11 0736 06/05/11 1415  BP: 122/70 119/64  115/59  Pulse: 60 64  65  Temp:  97.1 F (36.2 C)  94.9 F (34.9 C)  TempSrc:  Oral  Axillary  Resp:  18  18  Height:      Weight:  81.693 kg (180 lb 1.6 oz)    SpO2:  94% 94% 95%   Weight change: -3.307 kg (-7 lb 4.7 oz)  Intake/Output Summary (Last 24 hours) at 06/05/11 2017 Last data filed at 06/05/11 1829  Gross per 24 hour  Intake   1040 ml  Output   2725 ml  Net  -1685 ml    General: Alert, awake, oriented x3, in no acute distress.  HEENT: [place]/AT PEERL, EOMI Neck: Trachea midline,  no masses, no thyromegal,y no JVD, no carotid bruit OROPHARYNX:  Moist, No exudate/ erythema/lesions.  Heart: Regular rate and rhythm, without murmurs, rubs, gallops, PMI non-displaced, no heaves or thrills on palpation.  Lungs: Clear to auscultation, no wheezing or rhonchi noted.  Abdomen: Soft, nontender, nondistended, positive bowel sounds, no masses no hepatosplenomegaly noted..  Neuro: No focal neurological deficits noted cranial nerves II through XII grossly intact. DTRs 2+ bilaterally upper and lower extremities. Strength functional in bilateral upper and lower extremities.  Lab Results:  Basename 06/05/11 0527 06/04/11 0659  NA 139 136  K 3.4* 3.6  CL 98 100  CO2 32 27  GLUCOSE 102* 193*  BUN 24* 25*  CREATININE 0.86 0.71  CALCIUM 9.0 8.8  MG 2.1 --  PHOS -- --   No results found for this basename: AST:2,ALT:2,ALKPHOS:2,BILITOT:2,PROT:2,ALBUMIN:2 in the last 72 hours No results found for this basename:  LIPASE:2,AMYLASE:2 in the last 72 hours  Basename 06/05/11 0527 06/04/11 0659  WBC 6.0 6.4  NEUTROABS -- --  HGB 8.0* 8.0*  HCT 26.6* 25.9*  MCV 78.7 78.5  PLT 296 284    Basename 06/03/11 0830 06/03/11 0120  CKTOTAL 50 48  CKMB 2.7 2.7  CKMBINDEX -- --  TROPONINI <0.30 <0.30   No components found with this basename: POCBNP:3 No results found for this basename: DDIMER:2 in the last 72 hours No results found for this basename: HGBA1C:2 in the last 72 hours No results found for this basename: CHOL:2,HDL:2,LDLCALC:2,TRIG:2,CHOLHDL:2,LDLDIRECT:2 in the last 72 hours No results found for this basename: TSH,T4TOTAL,FREET3,T3FREE,THYROIDAB in the last 72 hours No results found for this basename: VITAMINB12:2,FOLATE:2,FERRITIN:2,TIBC:2,IRON:2,RETICCTPCT:2 in the last 72 hours  Micro Results: Recent Results (from the past 240 hour(s))  URINE CULTURE     Status: Normal   Collection Time   06/02/11 10:00 AM      Component Value Range Status Comment   Specimen Description URINE, CATHETERIZED   Final    Special Requests NONE   Final    Culture  Setup Time [id number]   Final    Colony Count NO GROWTH   Final    Culture NO GROWTH   Final    Report Status 06/03/2011 FINAL   Final   CULTURE, SPUTUM-ASSESSMENT     Status: Normal   Collection Time  06/02/11  5:13 PM      Component Value Range Status Comment   Specimen Description SPUTUM   Final    Special Requests NONE   Final    Sputum evaluation     Final    Value: THIS SPECIMEN IS ACCEPTABLE. RESPIRATORY CULTURE REPORT TO FOLLOW.   Report Status 06/02/2011 FINAL   Final   CULTURE, RESPIRATORY     Status: Normal (Preliminary result)   Collection Time   06/02/11  5:13 PM      Component Value Range Status Comment   Specimen Description SPUTUM   Final    Special Requests NONE   Final    Gram Stain     Final    Value: FEW WBC PRESENT,BOTH PMN AND MONONUCLEAR     RARE SQUAMOUS EPITHELIAL CELLS PRESENT     RARE GRAM POSITIVE COCCI IN  PAIRS   Culture NORMAL OROPHARYNGEAL FLORA   Final    Report Status PENDING   Incomplete     Studies/Results: Dg Chest Port 1 View  06/02/2011  *RADIOLOGY REPORT*  Clinical Data: Chest tightness.  Shortness of breath.  Cough. Pacemaker.  Hypertension.  PORTABLE CHEST - 1 VIEW  Comparison: 03/31/2011  Findings: Pulmonary vascular indistinctness noted with interstitial accentuation and faint right infrahilar and left lower lobe airspace opacity.  Mild cardiomegaly noted with atherosclerotic calcification of the aortic arch.  Dual lead pacer remains in place.  IMPRESSION:  1.  Cardiomegaly with vascular indistinctness and interstitial opacity favoring interstitial pulmonary edema.  There may be faint airspace edema in the right infrahilar and left lower lobe regions. Superimposed pneumonia is not completely excluded.  Original Report Authenticated By: Dellia Cloud, M.D.    Medications: I have reviewed the patient's current medications. Scheduled Meds:   . bumetanide  1 mg Intravenous BID BM  . calcium-vitamin D  1 tablet Oral BID WC  . diltiazem  300 mg Oral Daily  . feeding supplement  237 mL Oral TID WC  . feeding supplement  30 mL Oral BID WC  . ipratropium  0.5 mg Nebulization Q6H  . levalbuterol  0.63 mg Nebulization Q6H  . pantoprazole  40 mg Oral Q1200  . polysaccharide iron  150 mg Oral Q breakfast  . potassium chloride  40 mEq Oral Daily  . potassium chloride  40 mEq Oral Q2H  . ramipril  10 mg Oral Daily  . sodium chloride  3 mL Intravenous Q12H  . sotalol  160 mg Oral Q12H  . warfarin  3 mg Oral q1800  . Warfarin - Pharmacist Dosing Inpatient   Does not apply q1800   Continuous Infusions:  PRN Meds:.sodium chloride, acetaminophen, guaiFENesin-codeine, ondansetron (ZOFRAN) IV, sodium chloride Assessment/Plan: Patient Active Hospital Problem List: Diastolic CHF, acute on chronic (03/27/2011)   Assessment: Appreciate cardiology input regarding newly decrease LV ejection  fraction and suspicion for nonischemic Takatsubo cardiomyopathy. We'll continue diuresis at the direction of cardiology.   Hypertension (05/31/2010)   Assessment: Blood pressures are well controlled.    Atrial fibrillation (05/31/2010)   Assessment: Heart rate is controlled has been having some runs of atrial fibrillation. I agree that the patient would benefit from likely one unit of transfused packed red blood cells in the setting of what seems to be symptomatic anemia.    Anemia (03/27/2011)   Assessment: Certainly agree that it is important for this patient to have an anemia work up however is not necessary to pursue workup in the hospital. The patient  has already had the interventions for which hospitalization required. The goal of hospitalization is to maintain her oxygen carrying capacity and perfusion volume. I will defer to her primary care physician for the Neutrexin outpatient up to and including a hematology workup if that is warranted in her estimation. The patient has a baseline reticulocytes, done prior to her iron infusion. I will go having a reticulocyte count prior to the stricture the blood transfusion to assess for marrow response to the iron.    Cancer of sigmoid colon s/p sigmoid colectomy for T3N0 lesion on 1/18 (04/08/2011)   Assessment: The patient had a removal of a cancerous polyp I most recently. The patient is followed by Gen. surgery for their postsurgical followup. It is unclear as to the recommendations by gastroenterology however again I will defer to primary care physician to set up the surveillance management of this patient with colon cancer.     Pneumonia (06/02/2011)   Assessment: I think the diagnosis of pneumonia this patient can be dispensed if she has had no clinical signs and symptoms of pneumonia despite the absence of antibiotics.      LOS: 3 days

## 2011-06-05 NOTE — Progress Notes (Signed)
Pt had 180 beats of VT unabel to tell if it is true reading.  Pt is asymptomatic.  BP 110/57, HR67 .  Dr.Turner and T. Claiborne Billings notified.  Dr. Mayford Knife will have a follow in the am,  Amanda Pea, RN.

## 2011-06-06 ENCOUNTER — Inpatient Hospital Stay (HOSPITAL_COMMUNITY): Payer: Medicare Other

## 2011-06-06 DIAGNOSIS — I4891 Unspecified atrial fibrillation: Secondary | ICD-10-CM

## 2011-06-06 DIAGNOSIS — R0989 Other specified symptoms and signs involving the circulatory and respiratory systems: Secondary | ICD-10-CM

## 2011-06-06 LAB — BASIC METABOLIC PANEL
BUN: 26 mg/dL — ABNORMAL HIGH (ref 6–23)
Calcium: 9.7 mg/dL (ref 8.4–10.5)
Creatinine, Ser: 0.83 mg/dL (ref 0.50–1.10)
GFR calc Af Amer: 72 mL/min — ABNORMAL LOW (ref >90)
GFR calc non Af Amer: 62 mL/min — ABNORMAL LOW (ref >90)

## 2011-06-06 LAB — CULTURE, RESPIRATORY W GRAM STAIN

## 2011-06-06 LAB — TYPE AND SCREEN: ABO/RH(D): O POS

## 2011-06-06 LAB — CBC
HCT: 33.3 % — ABNORMAL LOW (ref 36.0–46.0)
Hemoglobin: 10.1 g/dL — ABNORMAL LOW (ref 12.0–15.0)
MCH: 24.8 pg — ABNORMAL LOW (ref 26.0–34.0)
MCHC: 30.3 g/dL (ref 30.0–36.0)
RDW: 19.4 % — ABNORMAL HIGH (ref 11.5–15.5)

## 2011-06-06 LAB — DIFFERENTIAL
Basophils Absolute: 0.1 10*3/uL (ref 0.0–0.1)
Basophils Relative: 1 % (ref 0–1)
Eosinophils Absolute: 0.1 10*3/uL (ref 0.0–0.7)
Monocytes Absolute: 1.2 10*3/uL — ABNORMAL HIGH (ref 0.1–1.0)
Monocytes Relative: 14 % — ABNORMAL HIGH (ref 3–12)

## 2011-06-06 NOTE — Progress Notes (Signed)
Subjective:  Patient admitted with exacerbation of CHF and anemia, feeling better after transfusion last night.  Diuresing on IV bumex.  No chest pain. Objective:  Vital Signs in the last 24 hours: Temp:  [94.9 F (34.9 C)-98.2 F (36.8 C)] 97.5 F (36.4 C) (03/23 0335) Pulse Rate:  [64-65] 65  (03/23 0335) Resp:  [18] 18  (03/23 0335) BP: (113-126)/(59-78) 126/78 mmHg (03/23 0335) SpO2:  [94 %-95 %] 94 % (03/22 2039) Weight:  [81.239 kg (179 lb 1.6 oz)] 81.239 kg (179 lb 1.6 oz) (03/23 0557)  Intake/Output from previous day: 03/22 0701 - 03/23 0700 In: 1930 [P.O.:1580; Blood:350] Out: 2125 [Urine:2125] Intake/Output from this shift: Total I/O In: 240 [P.O.:240] Out: -      . bumetanide  1 mg Intravenous BID BM  . calcium-vitamin D  1 tablet Oral BID WC  . diltiazem  300 mg Oral Daily  . feeding supplement  237 mL Oral TID WC  . feeding supplement  30 mL Oral BID WC  . pantoprazole  40 mg Oral Q1200  . polysaccharide iron  150 mg Oral Q breakfast  . potassium chloride  40 mEq Oral Daily  . potassium chloride  40 mEq Oral Q2H  . ramipril  10 mg Oral Daily  . sodium chloride  3 mL Intravenous Q12H  . sotalol  160 mg Oral Q12H  . warfarin  3 mg Oral q1800  . Warfarin - Pharmacist Dosing Inpatient   Does not apply q1800  . DISCONTD: ipratropium  0.5 mg Nebulization Q6H  . DISCONTD: levalbuterol  0.63 mg Nebulization Q6H      Physical Exam: The patient appears to be in no distress.  Head and neck exam reveals that the pupils are equal and reactive.  The extraocular movements are full.  There is no scleral icterus.  Mouth and pharynx are benign.  No lymphadenopathy.  No carotid bruits.  The jugular venous pressure is normal.  Thyroid is not enlarged or tender.  Chest reveals bilateral rales  Heart reveals no abnormal lift or heave.  First and second heart sounds are normal.  There is no murmur gallop rub or click.  Pacemaker pocket in left upper chest.  The abdomen  is soft and nontender.  Bowel sounds are normoactive.  There is no hepatosplenomegaly or mass.  There are no abdominal bruits.  Extremities reveal no phlebitis or edema.  Pedal pulses are good.  There is no cyanosis or clubbing.  Neurologic exam is normal strength and no lateralizing weakness.  No sensory deficits.  Integument reveals no rash  Lab Results:  Basename 06/06/11 0644 06/05/11 0527  WBC 8.7 6.0  HGB 10.1* 8.0*  PLT 357 296    Basename 06/06/11 0644 06/05/11 0527  NA 141 139  K 3.9 3.4*  CL 100 98  CO2 32 32  GLUCOSE 149* 102*  BUN 26* 24*  CREATININE 0.83 0.86   No results found for this basename: TROPONINI:2,CK,MB:2 in the last 72 hours Hepatic Function Panel No results found for this basename: PROT,ALBUMIN,AST,ALT,ALKPHOS,BILITOT,BILIDIR,IBILI in the last 72 hours No results found for this basename: CHOL in the last 72 hours No results found for this basename: PROTIME in the last 72 hours  Imaging: Imaging results have been reviewed. No recent studies  Cardiac Studies: Telemetry shows runs of SVT and underlying V pacer. Assessment/Plan:  Patient Active Hospital Problem List: Diastolic CHF, acute on chronic (03/27/2011)   Assessment: Improved since admission.   Plan: Continue bumex  Atrial fibrillation (05/31/2010)   Assessment: Rate excessive at times, but improved since transfusion was completed.   Plan: Continue cardizem and sotolol.    LOS: 4 days    Cassell Clement 06/06/2011, 10:57 AM

## 2011-06-06 NOTE — Progress Notes (Signed)
Subjective: Patient states that she feels much more energetic since having received blood last night. The patient has been maintaining oxygenation at 93% on room air. Objective: Filed Vitals:   06/06/11 0220 06/06/11 0320 06/06/11 0335 06/06/11 0557  BP: 124/72 125/78 126/78   Pulse: 65 65 65   Temp: 97.8 F (36.6 C) 97.8 F (36.6 C) 97.5 F (36.4 C)   TempSrc: Oral Oral Oral   Resp: 18 18 18    Height:      Weight:    81.239 kg (179 lb 1.6 oz)  SpO2:       Weight change: -0.454 kg (-1 lb)  Intake/Output Summary (Last 24 hours) at 06/06/11 9604 Last data filed at 06/06/11 0608  Gross per 24 hour  Intake   1930 ml  Output   2125 ml  Net   -195 ml    General: Alert, awake, oriented x3, in no acute distress.  HEENT: /AT [place]ER[place], EOMI Neck: Trachea midline,  no masses, no thyromegal,y no JVD, no carotid bruit OROPHARYNX:  Moist, No exudate/ erythema/lesions.  Heart: Regular rate and rhythm, without murmurs, rubs, gallops, PMI non-displaced, no heaves or thrills on palpation.  Lungs: Mild transmitted upper airway transmitted sounds. No crackles, no wheezing or rhonchi noted.  Abdomen: Soft, nontender, nondistended, positive bowel sounds, no masses no hepatosplenomegaly noted..  Neuro: No focal neurological deficits noted cranial nerves II through XII grossly intact. DTRs 2+ bilaterally upper and lower extremities. Strength functional in bilateral upper and lower extremities.  Lab Results:  Basename 06/06/11 0644 06/05/11 0527  NA 141 139  K 3.9 3.4*  CL 100 98  CO2 32 32  GLUCOSE 149* 102*  BUN 26* 24*  CREATININE 0.83 0.86  CALCIUM 9.7 9.0  MG -- 2.1  PHOS -- --   No results found for this basename: AST:2,ALT:2,ALKPHOS:2,BILITOT:2,PROT:2,ALBUMIN:2 in the last 72 hours No results found for this basename: LIPASE:2,AMYLASE:2 in the last 72 hours  Basename 06/06/11 0644 06/05/11 0527  WBC 8.7 6.0  NEUTROABS 5.3 --  HGB 10.1* 8.0*  HCT 33.3* 26.6*  MCV 81.6 78.7  PLT  357 296    Basename 06/03/11 0830  CKTOTAL 50  CKMB 2.7  CKMBINDEX --  TROPONINI <0.30   No components found with this basename: POCBNP:3 No results found for this basename: DDIMER:2 in the last 72 hours No results found for this basename: HGBA1C:2 in the last 72 hours No results found for this basename: CHOL:2,HDL:2,LDLCALC:2,TRIG:2,CHOLHDL:2,LDLDIRECT:2 in the last 72 hours No results found for this basename: TSH,T4TOTAL,FREET3,T3FREE,THYROIDAB in the last 72 hours  Basename 06/05/11 2120  VITAMINB12 --  FOLATE --  FERRITIN --  TIBC --  IRON --  RETICCTPCT 2.8    Micro Results: Recent Results (from the past 240 hour(s))  URINE CULTURE     Status: Normal   Collection Time   06/02/11 10:00 AM      Component Value Range Status Comment   Specimen Description URINE, CATHETERIZED   Final    Special Requests NONE   Final    Culture  Setup Time [id number]   Final    Colony Count NO GROWTH   Final    Culture NO GROWTH   Final    Report Status 06/03/2011 FINAL   Final   CULTURE, SPUTUM-ASSESSMENT     Status: Normal   Collection Time   06/02/11  5:13 PM      Component Value Range Status Comment   Specimen Description SPUTUM   Final  Special Requests NONE   Final    Sputum evaluation     Final    Value: THIS SPECIMEN IS ACCEPTABLE. RESPIRATORY CULTURE REPORT TO FOLLOW.   Report Status 06/02/2011 FINAL   Final   CULTURE, RESPIRATORY     Status: Normal (Preliminary result)   Collection Time   06/02/11  5:13 PM      Component Value Range Status Comment   Specimen Description SPUTUM   Final    Special Requests NONE   Final    Gram Stain     Final    Value: FEW WBC PRESENT,BOTH PMN AND MONONUCLEAR     RARE SQUAMOUS EPITHELIAL CELLS PRESENT     RARE GRAM POSITIVE COCCI IN PAIRS   Culture NORMAL OROPHARYNGEAL FLORA   Final    Report Status PENDING   Incomplete     Studies/Results: Dg Chest Port 1 View  06/02/2011  *RADIOLOGY REPORT*  Clinical Data: Chest tightness.   Shortness of breath.  Cough. Pacemaker.  Hypertension.  PORTABLE CHEST - 1 VIEW  Comparison: 03/31/2011  Findings: Pulmonary vascular indistinctness noted with interstitial accentuation and faint right infrahilar and left lower lobe airspace opacity.  Mild cardiomegaly noted with atherosclerotic calcification of the aortic arch.  Dual lead pacer remains in place.  IMPRESSION:  1.  Cardiomegaly with vascular indistinctness and interstitial opacity favoring interstitial pulmonary edema.  There may be faint airspace edema in the right infrahilar and left lower lobe regions. Superimposed pneumonia is not completely excluded.  Original Report Authenticated By: Dellia Cloud, M.D.    Medications: I have reviewed the patient's current medications. Scheduled Meds:    . bumetanide  1 mg Intravenous BID BM  . calcium-vitamin D  1 tablet Oral BID WC  . diltiazem  300 mg Oral Daily  . feeding supplement  237 mL Oral TID WC  . feeding supplement  30 mL Oral BID WC  . pantoprazole  40 mg Oral Q1200  . polysaccharide iron  150 mg Oral Q breakfast  . potassium chloride  40 mEq Oral Daily  . potassium chloride  40 mEq Oral Q2H  . ramipril  10 mg Oral Daily  . sodium chloride  3 mL Intravenous Q12H  . sotalol  160 mg Oral Q12H  . warfarin  3 mg Oral q1800  . Warfarin - Pharmacist Dosing Inpatient   Does not apply q1800  . DISCONTD: ipratropium  0.5 mg Nebulization Q6H  . DISCONTD: levalbuterol  0.63 mg Nebulization Q6H   Continuous Infusions:  PRN Meds:.sodium chloride, acetaminophen, guaiFENesin-codeine, ipratropium, levalbuterol, ondansetron (ZOFRAN) IV, sodium chloride Assessment/Plan: Patient Active Hospital Problem List: Diastolic CHF, acute on chronic (03/27/2011)   Assessment: Appreciate cardiology input regarding newly decrease LV ejection fraction and suspicion for nonischemic Takatsubo cardiomyopathy. We'll continue diuresis at the direction of cardiology. ? Transition to oral diuretics.    Hypertension (05/31/2010)   Assessment: Blood pressures are well controlled.    Atrial fibrillation (05/31/2010)   Assessment: Heart rate is controlled has been having some runs of atrial fibrillation.  Anemia (03/27/2011)   Assessment: Certainly agree that it is important for this patient to have an anemia work up however is not necessary to pursue workup in the hospital. The patient has already had the interventions for which hospitalization required. The goal of hospitalization is to maintain her oxygen carrying capacity and perfusion volume. I will defer to her primary care physician for the Neutrexin outpatient up to and including a hematology workup if that  is warranted in her estimation. The patient has a baseline reticulocytes, done prior to her iron infusion. I will go having a reticulocyte count prior to the stricture the blood transfusion to assess for marrow response to the iron.    Cancer of sigmoid colon s/p sigmoid colectomy for T3N0 lesion on 1/18 (04/08/2011)   Assessment: The patient had a removal of a cancerous polyp I most recently. The patient is followed by Gen. surgery for their postsurgical followup. It is unclear as to the recommendations by gastroenterology however again I will defer to primary care physician to set up the surveillance management of this patient with colon cancer.     Pneumonia (06/02/2011)   Assessment: I think the diagnosis of pneumonia this patient can be dispensed if she has had no clinical signs and symptoms of pneumonia despite the absence of antibiotics.   Disposition: Hopefully home in the next 48 hours.    LOS: 4 days

## 2011-06-07 LAB — OCCULT BLOOD X 1 CARD TO LAB, STOOL: Fecal Occult Bld: POSITIVE

## 2011-06-07 MED ORDER — BUMETANIDE 1 MG PO TABS
1.0000 mg | ORAL_TABLET | Freq: Every day | ORAL | Status: DC
  Administered 2011-06-08 – 2011-06-10 (×3): 1 mg via ORAL
  Filled 2011-06-07 (×4): qty 1

## 2011-06-07 NOTE — Progress Notes (Signed)
Subjective: Patient states that she feels great. He has had a nice weight loss and she has been diuresed. The patient has no complaints of chest pain or any other complaints at this time. Objective: Filed Vitals:   06/06/11 2108 06/07/11 0536 06/07/11 1016 06/07/11 1414  BP: 150/83 152/81 148/78 113/64  Pulse: 66 65  65  Temp: 98.4 F (36.9 C) 99 F (37.2 C)  97.2 F (36.2 C)  TempSrc: Oral Oral  Oral  Resp: 18 20  19   Height:      Weight:  80.423 kg (177 lb 4.8 oz)    SpO2: 92% 92%  91%   Weight change: -0.816 kg (-1 lb 12.8 oz)  Intake/Output Summary (Last 24 hours) at 06/07/11 1518 Last data filed at 06/07/11 1251  Gross per 24 hour  Intake   1067 ml  Output   2105 ml  Net  -1038 ml    General: Alert, awake, oriented x3, in no acute distress.  HEENT: [place]/AT PEERL, EOMI Neck: Trachea midline,  no masses, no thyromegal,y no JVD, no carotid bruit OROPHARYNX:  Moist, No exudate/ erythema/lesions.  Heart: Regular rate and rhythm, without murmurs, rubs, gallops, PMI non-displaced, no heaves or thrills on palpation.  Lungs: Mild transmitted upper airway transmitted sounds. No crackles, no wheezing or rhonchi noted.  Abdomen: Soft, nontender, nondistended, positive bowel sounds, no masses no hepatosplenomegaly noted..  Neuro: No focal neurological deficits noted cranial nerves II through XII grossly intact. DTRs 2+ bilaterally upper and lower extremities. Strength functional in bilateral upper and lower extremities.  Lab Results:  Basename 06/06/11 0644 06/05/11 0527  NA 141 139  K 3.9 3.4*  CL 100 98  CO2 32 32  GLUCOSE 149* 102*  BUN 26* 24*  CREATININE 0.83 0.86  CALCIUM 9.7 9.0  MG -- 2.1  PHOS -- --   No results found for this basename: AST:2,ALT:2,ALKPHOS:2,BILITOT:2,PROT:2,ALBUMIN:2 in the last 72 hours No results found for this basename: LIPASE:2,AMYLASE:2 in the last 72 hours  Basename 06/06/11 0644 06/05/11 0527  WBC 8.7 6.0  NEUTROABS 5.3 --  HGB 10.1*  8.0*  HCT 33.3* 26.6*  MCV 81.6 78.7  PLT 357 296   No results found for this basename: CKTOTAL:3,CKMB:3,CKMBINDEX:3,TROPONINI:3 in the last 72 hours No components found with this basename: POCBNP:3 No results found for this basename: DDIMER:2 in the last 72 hours No results found for this basename: HGBA1C:2 in the last 72 hours No results found for this basename: CHOL:2,HDL:2,LDLCALC:2,TRIG:2,CHOLHDL:2,LDLDIRECT:2 in the last 72 hours No results found for this basename: TSH,T4TOTAL,FREET3,T3FREE,THYROIDAB in the last 72 hours  Basename 06/05/11 2120  VITAMINB12 --  FOLATE --  FERRITIN --  TIBC --  IRON --  RETICCTPCT 2.8    Micro Results: Recent Results (from the past 240 hour(s))  URINE CULTURE     Status: Normal   Collection Time   06/02/11 10:00 AM      Component Value Range Status Comment   Specimen Description URINE, CATHETERIZED   Final    Special Requests NONE   Final    Culture  Setup Time [id number]   Final    Colony Count NO GROWTH   Final    Culture NO GROWTH   Final    Report Status 06/03/2011 FINAL   Final   CULTURE, SPUTUM-ASSESSMENT     Status: Normal   Collection Time   06/02/11  5:13 PM      Component Value Range Status Comment   Specimen Description SPUTUM   Final  Special Requests NONE   Final    Sputum evaluation     Final    Value: THIS SPECIMEN IS ACCEPTABLE. RESPIRATORY CULTURE REPORT TO FOLLOW.   Report Status 06/02/2011 FINAL   Final   CULTURE, RESPIRATORY     Status: Normal   Collection Time   06/02/11  5:13 PM      Component Value Range Status Comment   Specimen Description SPUTUM   Final    Special Requests NONE   Final    Gram Stain     Final    Value: FEW WBC PRESENT,BOTH PMN AND MONONUCLEAR     RARE SQUAMOUS EPITHELIAL CELLS PRESENT     RARE GRAM POSITIVE COCCI IN PAIRS   Culture FEW STREPTOCOCCUS,BETA HEMOLYIC NOT GROUP A   Final    Report Status 06/06/2011 FINAL   Final     Studies/Results: Dg Chest Port 1 View  06/02/2011   *RADIOLOGY REPORT*  Clinical Data: Chest tightness.  Shortness of breath.  Cough. Pacemaker.  Hypertension.  PORTABLE CHEST - 1 VIEW  Comparison: 03/31/2011  Findings: Pulmonary vascular indistinctness noted with interstitial accentuation and faint right infrahilar and left lower lobe airspace opacity.  Mild cardiomegaly noted with atherosclerotic calcification of the aortic arch.  Dual lead pacer remains in place.  IMPRESSION:  1.  Cardiomegaly with vascular indistinctness and interstitial opacity favoring interstitial pulmonary edema.  There may be faint airspace edema in the right infrahilar and left lower lobe regions. Superimposed pneumonia is not completely excluded.  Original Report Authenticated By: Dellia Cloud, M.D.    Medications: I have reviewed the patient's current medications. Scheduled Meds:    . bumetanide  1 mg Oral Daily  . calcium-vitamin D  1 tablet Oral BID WC  . diltiazem  300 mg Oral Daily  . feeding supplement  237 mL Oral TID WC  . feeding supplement  30 mL Oral BID WC  . pantoprazole  40 mg Oral Q1200  . polysaccharide iron  150 mg Oral Q breakfast  . potassium chloride  40 mEq Oral Daily  . ramipril  10 mg Oral Daily  . sodium chloride  3 mL Intravenous Q12H  . sotalol  160 mg Oral Q12H  . warfarin  3 mg Oral q1800  . Warfarin - Pharmacist Dosing Inpatient   Does not apply q1800  . DISCONTD: bumetanide  1 mg Intravenous BID BM   Continuous Infusions:  PRN Meds:.sodium chloride, acetaminophen, guaiFENesin-codeine, ipratropium, levalbuterol, ondansetron (ZOFRAN) IV, sodium chloride Assessment/Plan: Patient Active Hospital Problem List: Diastolic CHF, acute on chronic (03/27/2011)   Assessment: I discussed the patient's condition with Dr. Clement Sayres cardiology. I will DC the IV Bumex and transition patient over to Bumex 1 mg by mouth daily.                                      Hypertension (05/31/2010)   Assessment: Blood pressures are well controlled.      Atrial fibrillation (05/31/2010)   Assessment: Heart rate is controlled has been having some runs of atrial fibrillation.  Anemia (03/27/2011)   Assessment: Certainly agree that it is important for this patient to have an anemia work up however is not necessary to pursue workup in the hospital. The patient has already had the interventions for which hospitalization required. The goal of hospitalization is to maintain her oxygen carrying capacity and perfusion volume. I will defer  to her primary care physician for the Neutrexin outpatient up to and including a hematology workup if that is warranted in her estimation. The patient has a baseline reticulocytes, done prior to her iron infusion. I will go having a reticulocyte count prior to the stricture the blood transfusion to assess for marrow response to the iron.    Cancer of sigmoid colon s/p sigmoid colectomy for T3N0 lesion on 1/18 (04/08/2011)   Assessment: The patient had a removal of a cancerous polyp I most recently. The patient is followed by Gen. surgery for their postsurgical followup. It is unclear as to the recommendations by gastroenterology however again I will defer to primary care physician to set up the surveillance management of this patient with colon cancer.     Pneumonia (06/02/2011)   Assessment: I think the diagnosis of pneumonia this patient can be dispensed if she has had no clinical signs and symptoms of pneumonia despite the absence of antibiotics.   Disposition: Hopefully home in the next 48 hours.    LOS: 5 days

## 2011-06-07 NOTE — Progress Notes (Signed)
Subjective:  Patient admitted with exacerbation of CHF and anemia, feeling better after transfusion last night.  Diuresing on IV bumex.  No chest pain. Objective:  Vital Signs in the last 24 hours: Temp:  [97.7 F (36.5 C)-99 F (37.2 C)] 99 F (37.2 C) (03/24 0536) Pulse Rate:  [65-66] 65  (03/24 0536) Resp:  [18-20] 20  (03/24 0536) BP: (125-152)/(64-83) 148/78 mmHg (03/24 1016) SpO2:  [92 %-93 %] 92 % (03/24 0536) Weight:  [80.423 kg (177 lb 4.8 oz)] 80.423 kg (177 lb 4.8 oz) (03/24 0536)  Intake/Output from previous day: 03/23 0701 - 03/24 0700 In: 947 [P.O.:947] Out: 1530 [Urine:1530] Intake/Output from this shift: Total I/O In: 240 [P.O.:240] Out: 425 [Urine:425]     . bumetanide  1 mg Intravenous BID BM  . calcium-vitamin D  1 tablet Oral BID WC  . diltiazem  300 mg Oral Daily  . feeding supplement  237 mL Oral TID WC  . feeding supplement  30 mL Oral BID WC  . pantoprazole  40 mg Oral Q1200  . polysaccharide iron  150 mg Oral Q breakfast  . potassium chloride  40 mEq Oral Daily  . ramipril  10 mg Oral Daily  . sodium chloride  3 mL Intravenous Q12H  . sotalol  160 mg Oral Q12H  . warfarin  3 mg Oral q1800  . Warfarin - Pharmacist Dosing Inpatient   Does not apply q1800      Physical Exam: The patient appears to be in no distress.  Head and neck exam reveals that the pupils are equal and reactive.  The extraocular movements are full.  There is no scleral icterus.  Mouth and pharynx are benign.  No lymphadenopathy.  No carotid bruits.  The jugular venous pressure is normal.  Thyroid is not enlarged or tender.  Chest reveals bilateral rales  Heart reveals no abnormal lift or heave.  First and second heart sounds are normal.  There is no murmur gallop rub or click.  Pacemaker pocket in left upper chest. Pulse irregular,less rapid.  The abdomen is soft and nontender.  Bowel sounds are normoactive.  There is no hepatosplenomegaly or mass.  There are no  abdominal bruits.  Extremities reveal no phlebitis or edema.  Pedal pulses are good.  There is no cyanosis or clubbing.  Neurologic exam is normal strength and no lateralizing weakness.  No sensory deficits.  Integument reveals no rash  Lab Results:  Basename 06/06/11 0644 06/05/11 0527  WBC 8.7 6.0  HGB 10.1* 8.0*  PLT 357 296    Basename 06/06/11 0644 06/05/11 0527  NA 141 139  K 3.9 3.4*  CL 100 98  CO2 32 32  GLUCOSE 149* 102*  BUN 26* 24*  CREATININE 0.83 0.86   No results found for this basename: TROPONINI:2,CK,MB:2 in the last 72 hours Hepatic Function Panel No results found for this basename: PROT,ALBUMIN,AST,ALT,ALKPHOS,BILITOT,BILIDIR,IBILI in the last 72 hours No results found for this basename: CHOL in the last 72 hours No results found for this basename: PROTIME in the last 72 hours  Imaging: Imaging results have been reviewed. No recent studies  Cardiac Studies: Telemetry shows atrial fib and underlying V pacer. Assessment/Plan:  Patient Active Hospital Problem List: Diastolic CHF, acute on chronic (03/27/2011)   Assessment: Improved since admission.   Plan: Continue bumex.  Switch to oral route.  Atrial fibrillation (05/31/2010)   Assessment: Rate excessive at times, but improved since transfusion was completed.   Plan: Continue cardizem  and sotolol.    LOS: 5 days    Cassell Clement 06/07/2011, 10:47 AM

## 2011-06-07 NOTE — Plan of Care (Signed)
Problem: Phase I Progression Outcomes Goal: EF % per last Echo/documented,Core Reminder form on chart Outcome: Completed/Met Date Met:  06/07/11 EF 30-35 % 06-02-11

## 2011-06-08 LAB — PROTIME-INR
INR: 2.16 — ABNORMAL HIGH (ref 0.00–1.49)
Prothrombin Time: 24.5 seconds — ABNORMAL HIGH (ref 11.6–15.2)

## 2011-06-08 MED ORDER — AZITHROMYCIN 250 MG PO TABS
250.0000 mg | ORAL_TABLET | Freq: Every day | ORAL | Status: DC
Start: 2011-06-09 — End: 2011-06-10
  Administered 2011-06-09 – 2011-06-10 (×2): 250 mg via ORAL
  Filled 2011-06-08 (×2): qty 1

## 2011-06-08 MED ORDER — AZITHROMYCIN 500 MG PO TABS
500.0000 mg | ORAL_TABLET | Freq: Every day | ORAL | Status: AC
  Administered 2011-06-08: 500 mg via ORAL
  Filled 2011-06-08: qty 1

## 2011-06-08 NOTE — Progress Notes (Signed)
06/08/2011 [facility], Bosie Clos [facility] Case Management Note [phone]    CARE MANAGEMENT NOTE 06/08/2011  Patient:  YOSHINO, BROCCOLI   Account Number:  [id number]  Date Initiated:  06/03/2011  Documentation initiated by:  Fransico Michael  Subjective/Objective Assessment:   admitted on 06/02/11 with c/o shortness of breath and congestion     Action/Plan:   prior to admission, patient lived at home with support from family or friends.   Anticipated DC Date:  06/08/2011   Anticipated DC Plan:  HOME/SELF CARE      DC Planning Services  CM consult      Choice offered to / List presented to:             Status of service:  Completed, signed off Medicare Important Message given?   (If response is "NO", the following Medicare IM given date fields will be blank) Date Medicare IM given:   Date Additional Medicare IM given:    Discharge Disposition:  HOME/SELF CARE  Per UR Regulation:  Reviewed for med. necessity/level of care/duration of stay  If discussed at Long Length of Stay Meetings, dates discussed:    Comments:  PCP: Monica Becton  ContactElenor Legato [phone]  06/08/11-1547-J.Elenore Wanninger,RN,BSN  [phone]      Patient reports having Home care PT and RN through [facility]. Anticipated discharge to home within next 24-48 hours. CM will continue to monitor for discharge/home needs.

## 2011-06-08 NOTE — Progress Notes (Signed)
Physical Therapy Treatment Patient Details Name: TYKIERA RAVEN MRN: [id number] DOB: [birth year] Today's Date: 06/08/2011  PT Assessment/Plan  PT - Assessment/Plan Comments on Treatment Session: Ready to D/C home from a mobility stand point.  Son has worked it out for pt to have help most of the day.  Recommend HHPT home safety eval and treat. PT Plan: Discharge plan remains appropriate Follow Up Recommendations: Supervision - Intermittent;Home health PT Equipment Recommended: None recommended by PT PT Goals  Acute Rehab PT Goals PT Goal: Sit to Stand - Progress: Met PT Goal: Stand to Sit - Progress: Met PT Transfer Goal: Bed to Chair/Chair to Bed - Progress: Met PT Goal: Ambulate - Progress: Met  PT Treatment Precautions/Restrictions  Precautions Precautions: Fall Precaution Comments: none Restrictions Weight Bearing Restrictions: No Mobility (including Balance) Bed Mobility Bed Mobility: Yes Supine to Sit: 7: Independent Sitting - Scoot to Edge of Bed: 7: Independent Sit to Supine: 7: Independent Transfers Transfers: Yes Sit to Stand: 6: Modified independent (Device/Increase time) Sit to Stand Details (indicate cue type and reason): used cane approp. in the transfer process. Stand to Sit: 6: Modified independent (Device/Increase time) Ambulation/Gait Ambulation/Gait: Yes Ambulation/Gait Assistance: 6: Modified independent (Device/Increase time) Ambulation/Gait Assistance Details (indicate cue type and reason): with cane. slow cadence, safe use of cane Ambulation Distance (Feet): 180 Feet Assistive device: Straight cane Gait Pattern: Within Functional Limits Stairs: No Wheelchair Mobility Wheelchair Mobility: No  Posture/Postural Control Posture/Postural Control: No significant limitations Static Sitting Balance Static Sitting - Balance Support: No upper extremity supported;Feet supported;Feet unsupported Static Sitting - Level of Assistance: 7: Independent Static  Standing Balance Static Standing - Balance Support: No upper extremity supported;During functional activity Static Standing - Level of Assistance: 6: Modified independent (Device/Increase time) Exercise    End of Session PT - End of Session Activity Tolerance: Patient tolerated treatment well Patient left: in bed;with call bell in reach;with family/visitor present Nurse Communication: Mobility status for ambulation General Behavior During Session: [facility] for tasks performed Cognition: [facility] for tasks performed  Eliany Mccarter, Eliseo Gum 06/08/2011, 5:44 PM  06/08/2011  [place] Bing, PT [phone] (pager)

## 2011-06-08 NOTE — Progress Notes (Signed)
CARDIAC REHAB PHASE I   PRE:  Rate/Rhythm: 65 Pacing  BP:  Supine:   Sitting: 120/70  Standing:    SaO2: 93 RA  MODE:  Ambulation: 260 ft   POST:  Rate/Rhythem: 65  BP:  Supine:   Sitting: 104/60  Standing:    SaO2: 94 RA [id number] Tolerated ambulation using walker and assist  X 1. Gait steady with walker. Walked 260 ft without c/o. To recliner after walk with call light in reach. Started CHF education with pt.  [name]

## 2011-06-08 NOTE — Progress Notes (Signed)
Subjective: "Lungs felt a little tight this morning" Had a breathing treatment. No CP, no syncope, no melena, no bleeding.   Objective:  Vital Signs in the last 24 hours: Temp:  [97.2 F (36.2 C)-98.3 F (36.8 C)] 98.3 F (36.8 C) (03/25 0453) Pulse Rate:  [65] 65  (03/25 0453) Resp:  [19-20] 20  (03/25 0453) BP: (113-148)/(64-78) 135/77 mmHg (03/25 0453) SpO2:  [91 %-95 %] 95 % (03/25 0822) Weight:  [78.79 kg (173 lb 11.2 oz)] 78.79 kg (173 lb 11.2 oz) (03/25 0453)  Intake/Output from previous day: 03/24 0701 - 03/25 0700 In: 843 [P.O.:840; I.V.:3] Out: 3375 [Urine:3375]   Physical Exam: General: Elderly well nourished, in no acute distress. [place]:  Normocephalic and atraumatic. Lungs: LLL crackles, exp wheeze mild Heart: Normal S1 and S2.  No murmur, rubs or gallops.  Abdomen: soft, non-tender, positive bowel sounds. Extremities: Trace edema., warm Neurologic: Alert and oriented x 3.    Lab Results:  [facility] 06/06/11 0644  WBC 8.7  HGB 10.1*  PLT 357    Basename 06/06/11 0644  NA 141  K 3.9  CL 100  CO2 32  GLUCOSE 149*  BUN 26*  CREATININE 0.83   FOBT +  Imaging: Dg Chest 2 View  06/06/2011  *RADIOLOGY REPORT*  Clinical Data: Cough.  Congestive heart failure.  CHEST - 2 VIEW  Comparison: CT chest 03/31/2011.  Plain films of the chest 06/02/2011, 03/27/2011 and 05/29/2004.  Findings: There is cardiomegaly.  Pulmonary edema seen on the prior study has resolved.  Coarsening of the pulmonary interstitium is noted.  The patient has a tiny left pleural effusion.  IMPRESSION:  1.  Interval resolution of pulmonary edema with a tiny residual left effusion noted. 2.  Cardiomegaly and chronic coarsening of the pulmonary interstitium.  Original Report Authenticated By: Bernadene Bell. Maricela Curet, M.D.   Personally viewed.   Telemetry: Briief flutter HR 120's, now V paced  Personally viewed.   Assessment/Plan:  Combined Systolic and Diastolic CHF, acute on chronic  - currently feeling well after breathing treatment  - mild LLL crackles  - improved CXR from 3/23  - Continue with bumex 1mg  QD  - One could consider change from diltiazem to metoprolol secondary to decreased EF. Will defer to Dr. Eldridge Dace. She is technically on Sotalol.   - Last EF 30-35% ? Takatsubo  Aflutter  - brief episode this am around 940.   - On Sotalol 160 BID as well as diltiazem 300 QD  - Decreased burden since transfusion  - Monitor. No medication change  Chronic anticoagulation  - INR 2.1 tx on Warfarin  Anemia  - responded well to PRBC  - FOBT + x 2  - Consider GI consultation/ surgery consultation  Ponciano Shealy 06/08/2011, 9:59 AM

## 2011-06-08 NOTE — Progress Notes (Signed)
ANTICOAGULATION CONSULT NOTE - Follow Up Consult  Pharmacy Consult for coumadin Indication: atrial fibrillation  Vital Signs: Temp: 98.3 F (36.8 C) (03/25 0453) Temp src: Axillary (03/25 0453) BP: 135/77 mmHg (03/25 0453) Pulse Rate: 65  (03/25 0453)  Labs:  Basename 06/08/11 0645 06/06/11 0644  HGB -- 10.1*  HCT -- 33.3*  PLT -- 357  APTT -- --  LABPROT 24.5* --  INR 2.16* --  HEPARINUNFRC -- --  CREATININE -- 0.83   Estimated Creatinine Clearance: 51.5 ml/min (by C-G formula based on Cr of 0.83).  Assessment: 88 yof on chronic coumadin for atrial fibrillation. INR today is 2.16 and within desired goal range.  Only checking INRs on MWF since pt has been stable on her home dose of 3mg  daily. Pt anemic s/p iron dextran and daily iron supplementation with improvement in her H/H today. No bleeding noted.  Goal of Therapy:  INR 2-3   Plan:  1. Continue coumadin 3mg  PO daily 2. Continue to check INRs only on MWF 3. Monitor for s&s of bleeding 4. F/u anemia work-up  Nadara Mustard, PharmD., MS Clinical Pharmacist Pager:  [phone] 06/08/2011,10:20 AM

## 2011-06-08 NOTE — Progress Notes (Signed)
Subjective: Patient states that she does not feel as well today she did yesterday. She also states that she has been coughing up some yellow-colored sputum. The patient has no fevers but did have some tightness in her chest last night.   Objective: Filed Vitals:   06/08/11 0453 06/08/11 0822 06/08/11 1100 06/08/11 1500  BP: 135/77  130/80 104/66  Pulse: 65  122 65  Temp: 98.3 F (36.8 C)   98.6 F (37 C)  TempSrc: Axillary   Oral  Resp: 20   20  Height:      Weight: 78.79 kg (173 lb 11.2 oz)     SpO2: 91% 95%  94%   Weight change: -1.633 kg (-3 lb 9.6 oz)  Intake/Output Summary (Last 24 hours) at 06/08/11 1916 Last data filed at 06/08/11 1700  Gross per 24 hour  Intake   1206 ml  Output   1702 ml  Net   -496 ml    General: Alert, awake, oriented x3, in no acute distress.  HEENT: [place]/AT PEERL, EOMI Neck: Trachea midline,  no masses, no thyromegal,y no JVD, no carotid bruit OROPHARYNX:  Moist, No exudate/ erythema/lesions.  Heart: Regular rate and rhythm, without murmurs, rubs, gallops, PMI non-displaced, no heaves or thrills on palpation.  Lungs: Mild transmitted upper airway transmitted sounds. No crackles, no wheezing or rhonchi noted.  Abdomen: Soft, nontender, nondistended, positive bowel sounds, no masses no hepatosplenomegaly noted..   Lab Results:  Basename 06/06/11 0644  NA 141  K 3.9  CL 100  CO2 32  GLUCOSE 149*  BUN 26*  CREATININE 0.83  CALCIUM 9.7  MG --  PHOS --   No results found for this basename: AST:2,ALT:2,ALKPHOS:2,BILITOT:2,PROT:2,ALBUMIN:2 in the last 72 hours No results found for this basename: LIPASE:2,AMYLASE:2 in the last 72 hours  Basename 06/06/11 0644  WBC 8.7  NEUTROABS 5.3  HGB 10.1*  HCT 33.3*  MCV 81.6  PLT 357   No results found for this basename: CKTOTAL:3,CKMB:3,CKMBINDEX:3,TROPONINI:3 in the last 72 hours No components found with this basename: POCBNP:3 No results found for this basename: DDIMER:2 in the last 72  hours No results found for this basename: HGBA1C:2 in the last 72 hours No results found for this basename: CHOL:2,HDL:2,LDLCALC:2,TRIG:2,CHOLHDL:2,LDLDIRECT:2 in the last 72 hours No results found for this basename: TSH,T4TOTAL,FREET3,T3FREE,THYROIDAB in the last 72 hours  Basename 06/05/11 2120  VITAMINB12 --  FOLATE --  FERRITIN --  TIBC --  IRON --  RETICCTPCT 2.8    Micro Results: Recent Results (from the past 240 hour(s))  URINE CULTURE     Status: Normal   Collection Time   06/02/11 10:00 AM      Component Value Range Status Comment   Specimen Description URINE, CATHETERIZED   Final    Special Requests NONE   Final    Culture  Setup Time [id number]   Final    Colony Count NO GROWTH   Final    Culture NO GROWTH   Final    Report Status 06/03/2011 FINAL   Final   CULTURE, SPUTUM-ASSESSMENT     Status: Normal   Collection Time   06/02/11  5:13 PM      Component Value Range Status Comment   Specimen Description SPUTUM   Final    Special Requests NONE   Final    Sputum evaluation     Final    Value: THIS SPECIMEN IS ACCEPTABLE. RESPIRATORY CULTURE REPORT TO FOLLOW.   Report Status 06/02/2011 FINAL   Final  CULTURE, RESPIRATORY     Status: Normal   Collection Time   06/02/11  5:13 PM      Component Value Range Status Comment   Specimen Description SPUTUM   Final    Special Requests NONE   Final    Gram Stain     Final    Value: FEW WBC PRESENT,BOTH PMN AND MONONUCLEAR     RARE SQUAMOUS EPITHELIAL CELLS PRESENT     RARE GRAM POSITIVE COCCI IN PAIRS   Culture FEW STREPTOCOCCUS,BETA HEMOLYIC NOT GROUP A   Final    Report Status 06/06/2011 FINAL   Final     Studies/Results: Dg Chest Port 1 View  06/02/2011  *RADIOLOGY REPORT*  Clinical Data: Chest tightness.  Shortness of breath.  Cough. Pacemaker.  Hypertension.  PORTABLE CHEST - 1 VIEW  Comparison: 03/31/2011  Findings: Pulmonary vascular indistinctness noted with interstitial accentuation and faint right infrahilar  and left lower lobe airspace opacity.  Mild cardiomegaly noted with atherosclerotic calcification of the aortic arch.  Dual lead pacer remains in place.  IMPRESSION:  1.  Cardiomegaly with vascular indistinctness and interstitial opacity favoring interstitial pulmonary edema.  There may be faint airspace edema in the right infrahilar and left lower lobe regions. Superimposed pneumonia is not completely excluded.  Original Report Authenticated By: Dellia Cloud, M.D.    Medications: I have reviewed the patient's current medications. Scheduled Meds:    . azithromycin  500 mg Oral Daily   Followed by  . azithromycin  250 mg Oral Daily  . bumetanide  1 mg Oral Daily  . calcium-vitamin D  1 tablet Oral BID WC  . diltiazem  300 mg Oral Daily  . feeding supplement  237 mL Oral TID WC  . feeding supplement  30 mL Oral BID WC  . pantoprazole  40 mg Oral Q1200  . polysaccharide iron  150 mg Oral Q breakfast  . potassium chloride  40 mEq Oral Daily  . ramipril  10 mg Oral Daily  . sodium chloride  3 mL Intravenous Q12H  . sotalol  160 mg Oral Q12H  . warfarin  3 mg Oral q1800  . Warfarin - Pharmacist Dosing Inpatient   Does not apply q1800   Continuous Infusions:  PRN Meds:.sodium chloride, acetaminophen, guaiFENesin-codeine, ipratropium, levalbuterol, ondansetron (ZOFRAN) IV, sodium chloride Assessment/Plan: Patient Active Hospital Problem List: Diastolic CHF, acute on chronic (03/27/2011)   Assessment: I discussed the patient's condition with Dr. Patty Sermons of cardiology. I will DC the IV Bumex and transition patient over to Bumex 1 mg by mouth daily.                                      Hypertension (05/31/2010)   Assessment: Blood pressures are well controlled.    Atrial fibrillation (05/31/2010)   Assessment: Heart rate is controlled has been having some runs of atrial fibrillation.  Anemia (03/27/2011)   Assessment: Certainly agree that it is important for this patient to have an  anemia work up however is not necessary to pursue workup in the hospital. The patient has already had the interventions for which hospitalization required. The goal of hospitalization is to maintain her oxygen carrying capacity and perfusion volume. I will defer to her primary care physician for the Neutrexin outpatient up to and including a hematology workup if that is warranted in her estimation. The patient has a baseline reticulocytes, done  prior to her iron infusion. I will go having a reticulocyte count prior to the stricture the blood transfusion to assess for marrow response to the iron.    Cancer of sigmoid colon s/p sigmoid colectomy for T3N0 lesion on 1/18 (04/08/2011)   Assessment: The patient had a removal of a cancerous polyp I most recently. The patient is followed by Gen. surgery for their postsurgical followup. It is unclear as to the recommendations by gastroenterology however again I will defer to primary care physician to set up the surveillance management of this patient with colon cancer.      Acute tracheobronchitis (06/08/2011)   Assessment: The patient had some tightness in her chest which resolved with beta agonists. She also has some clear to yellowish sputum this morning. In light of this I going to start the patient on a azithromycin observe for the next 24 hours she has no derangement in her clinical condition that she'll be oh to be discharged home  Disposition: Hopefully home in the next 24 hours.    LOS: 6 days

## 2011-06-09 ENCOUNTER — Inpatient Hospital Stay (HOSPITAL_COMMUNITY): Payer: Medicare Other

## 2011-06-09 ENCOUNTER — Other Ambulatory Visit: Payer: Self-pay

## 2011-06-09 DIAGNOSIS — J209 Acute bronchitis, unspecified: Secondary | ICD-10-CM | POA: Diagnosis present

## 2011-06-09 LAB — BASIC METABOLIC PANEL
Calcium: 9.5 mg/dL (ref 8.4–10.5)
GFR calc Af Amer: 67 mL/min — ABNORMAL LOW (ref >90)
GFR calc non Af Amer: 58 mL/min — ABNORMAL LOW (ref >90)
Glucose, Bld: 168 mg/dL — ABNORMAL HIGH (ref 70–99)
Potassium: 4.1 mEq/L (ref 3.5–5.1)
Sodium: 137 mEq/L (ref 135–145)

## 2011-06-09 LAB — DIFFERENTIAL
Basophils Absolute: 0 10*3/uL (ref 0.0–0.1)
Basophils Relative: 1 % (ref 0–1)
Eosinophils Absolute: 0.1 10*3/uL (ref 0.0–0.7)
Eosinophils Relative: 2 % (ref 0–5)
Lymphs Abs: 1.5 10*3/uL (ref 0.7–4.0)
Neutrophils Relative %: 67 % (ref 43–77)

## 2011-06-09 LAB — CBC
MCH: 25.3 pg — ABNORMAL LOW (ref 26.0–34.0)
MCV: 84.5 fL (ref 78.0–100.0)
Platelets: 310 10*3/uL (ref 150–400)
RBC: 4.07 MIL/uL (ref 3.87–5.11)
RDW: 22.2 % — ABNORMAL HIGH (ref 11.5–15.5)

## 2011-06-09 MED ORDER — DOCUSATE SODIUM 100 MG PO CAPS
100.0000 mg | ORAL_CAPSULE | Freq: Every day | ORAL | Status: DC
  Administered 2011-06-09 – 2011-06-10 (×2): 100 mg via ORAL
  Filled 2011-06-09 (×2): qty 1

## 2011-06-09 MED ORDER — PRAMOXINE HCL 1 % RE OINT
TOPICAL_OINTMENT | Freq: Three times a day (TID) | RECTAL | Status: DC | PRN
  Filled 2011-06-09: qty 30

## 2011-06-09 NOTE — Progress Notes (Signed)
Pt ambulated in hallway on RA.  O2 sats remained in the mid to upper 90's, with lowest being 94%.   Nino Glow RN

## 2011-06-09 NOTE — Progress Notes (Addendum)
SUBJECTIVE:  Feels much better today but had several episodes of aflutter with RVR this am  OBJECTIVE:   Vitals:   Filed Vitals:   06/08/11 1100 06/08/11 1500 06/08/11 2028 06/09/11 0425  BP: 130/80 104/66 112/55 125/73  Pulse: 65 65 65 64  Temp:  98.6 F (37 C) 97.7 F (36.5 C) 97.9 F (36.6 C)  TempSrc:  Oral Oral Oral  Resp:  20 18 18   Height:      Weight:    79.3 kg (174 lb 13.2 oz)  SpO2:  94% 95% 96%   I&O's:   Intake/Output Summary (Last 24 hours) at 06/09/11 0919 Last data filed at 06/09/11 1610  Gross per 24 hour  Intake    963 ml  Output   1202 ml  Net   -239 ml   TELEMETRY: Reviewed telemetry pt in NSR with Vpacing and runs of aflutter with RVR     PHYSICAL EXAM General: Well developed, well nourished, in no acute distress Head: Eyes PERRLA, No xanthomas.   Normal cephalic and atramatic  Lungs: few scattered wheezes. Heart:   HRRR S1 S2 Pulses are 2+ & equal.            No carotid bruit. No JVD.  No abdominal bruits. No femoral bruits. Abdomen: Bowel sounds are positive, abdomen soft and non-tender without masses or                  Hernia's noted. Msk:  Back normal, normal gait. Normal strength and tone for age. Extremities:   No clubbing, cyanosis or edema.  DP +1 Neuro: Alert and oriented X 3. Psych:  Good affect, responds appropriately   LABS: Coag Panel:   Lab Results  Component Value Date   INR 2.16* 06/08/2011   INR 2.18* 06/05/2011   INR 2.12* 06/03/2011    RADIOLOGY: Dg Chest 2 View  06/06/2011  *RADIOLOGY REPORT*  Clinical Data: Cough.  Congestive heart failure.  CHEST - 2 VIEW  Comparison: CT chest 03/31/2011.  Plain films of the chest 06/02/2011, 03/27/2011 and 05/29/2004.  Findings: There is cardiomegaly.  Pulmonary edema seen on the prior study has resolved.  Coarsening of the pulmonary interstitium is noted.  The patient has a tiny left pleural effusion.  IMPRESSION:  1.  Interval resolution of pulmonary edema with a tiny residual left  effusion noted. 2.  Cardiomegaly and chronic coarsening of the pulmonary interstitium.  Original Report Authenticated By: Bernadene Bell. Maricela Curet, M.D.   Dg Chest Port 1 View  06/02/2011  *RADIOLOGY REPORT*  Clinical Data: Chest tightness.  Shortness of breath.  Cough. Pacemaker.  Hypertension.  PORTABLE CHEST - 1 VIEW  Comparison: 03/31/2011  Findings: Pulmonary vascular indistinctness noted with interstitial accentuation and faint right infrahilar and left lower lobe airspace opacity.  Mild cardiomegaly noted with atherosclerotic calcification of the aortic arch.  Dual lead pacer remains in place.  IMPRESSION:  1.  Cardiomegaly with vascular indistinctness and interstitial opacity favoring interstitial pulmonary edema.  There may be faint airspace edema in the right infrahilar and left lower lobe regions. Superimposed pneumonia is not completely excluded.  Original Report Authenticated By: Dellia Cloud, M.D.      ASSESSMENT:  1.  Acute on chronic systolic/diastolic CHF much improved - still with some mild wheezing 2.  Atrial flutter on Sotolol but having increased breakthrough of arrhythmias 3.  Chronic systemic anticoagulation 4.  Anemia  PLAN:   1.  Continue PO Bumex 2.  Would hold on  changing from Cardizem to beta blocker today due to some wheezing on exam 3.  EP consult for recurrent aflutter with RVR - ? Switch to Jessica Priest, MD  06/09/2011  9:19 AM

## 2011-06-09 NOTE — Progress Notes (Signed)
Subjective: Patient is a lovely 78 year old female with a history of atrial fibrillation on Coumadin, chronic diastolic heart failure and tachybradycardia syndrome with a pacemaker. Patient was also recently diagnosed with colon cancer and underwent a polypectomy without any need for radiation or chemotherapy. This is followed by Gen. Surgery.   The patient was admitted with acute on chronic diastolic heart failure and atrial fibrillation with rapid ventricular response. Initially a diagnosis of patient's thought to have a pneumonia however in retrospect clinical course and radiologic findings did not support pneumonia and death the antibiotics were stopped after 24 hours. From an infectious disease standpoint the patient has had a fairly uneventful course off of the antibiotics. She has not had an elevated white blood cell count nor has she had any fevers. The patient does however appear to have an acute bronchitis which was developed in the last 48 hour with some wheezing and clear to yellowish sputum production. Thus the patient was started on a azithromycin, in addition to when necessary albuterol. No steroids have been initiated as the patient's oxygen saturations have been normal on room air appear  A repeat CBC today did not show an elevation of white blood cell count and a repeat chest x-ray showed no evidence of acute pulmonary process additionally the patient has had no fevers during this hospitalization.  Overnight the patient had arrived of atrial fibrillation with rapid ventricular response. I asked cardiology to come back and see the patient and they feel the patient needs to stay in the hospital for further evaluation by electrophysiology specialist here. Thus the patient's hospitalization has been prolonged in light of this new development. However from a standpoint of fluid balance appear should appears to be doing well and it is quite possible that her intermittent runs of rapid ventricular  response could be transiently putting the patient into pulmonary edema.  Objective: Filed Vitals:   06/08/11 2028 06/09/11 0425 06/09/11 1058 06/09/11 1500  BP: 112/55 125/73 131/73 116/76  Pulse: 65 64 65 65  Temp: 97.7 F (36.5 C) 97.9 F (36.6 C)  98.6 F (37 C)  TempSrc: Oral Oral  Oral  Resp: 18 18  18   Height:      Weight:  79.3 kg (174 lb 13.2 oz)    SpO2: 95% 96%  95%   Weight change: 0.51 kg (1 lb 2 oz)  Intake/Output Summary (Last 24 hours) at 06/09/11 1727 Last data filed at 06/09/11 1609  Gross per 24 hour  Intake    723 ml  Output   1102 ml  Net   -379 ml    General: Alert, awake, oriented x3, in no acute distress.  HEENT: [place]/AT [place]ER[place], EOMI Neck: Trachea midline,  no masses, no thyromegal,y no JVD, no carotid bruit OROPHARYNX:  Moist, No exudate/ erythema/lesions.  Heart: Regular rate and rhythm, without murmurs, rubs, gallops, PMI non-displaced, no heaves or thrills on palpation.  Lungs: Mild transmitted upper airway transmitted sounds. No crackles, no wheezing or rhonchi noted.  Abdomen: Soft, nontender, nondistended, positive bowel sounds, no masses no hepatosplenomegaly noted..   Lab Results:  Basename 06/09/11 0955  NA 137  K 4.1  CL 96  CO2 35*  GLUCOSE 168*  BUN 29*  CREATININE 0.88  CALCIUM 9.5  MG --  PHOS --   No results found for this basename: AST:2,ALT:2,ALKPHOS:2,BILITOT:2,PROT:2,ALBUMIN:2 in the last 72 hours No results found for this basename: LIPASE:2,AMYLASE:2 in the last 72 hours  Basename 06/09/11 0955  WBC 8.0  NEUTROABS  5.3  HGB 10.3*  HCT 34.4*  MCV 84.5  PLT 310   No results found for this basename: CKTOTAL:3,CKMB:3,CKMBINDEX:3,TROPONINI:3 in the last 72 hours No components found with this basename: POCBNP:3 No results found for this basename: DDIMER:2 in the last 72 hours No results found for this basename: HGBA1C:2 in the last 72 hours No results found for this basename:  CHOL:2,HDL:2,LDLCALC:2,TRIG:2,CHOLHDL:2,LDLDIRECT:2 in the last 72 hours No results found for this basename: TSH,T4TOTAL,FREET3,T3FREE,THYROIDAB in the last 72 hours No results found for this basename: VITAMINB12:2,FOLATE:2,FERRITIN:2,TIBC:2,IRON:2,RETICCTPCT:2 in the last 72 hours  Micro Results: Recent Results (from the past 240 hour(s))  URINE CULTURE     Status: Normal   Collection Time   06/02/11 10:00 AM      Component Value Range Status Comment   Specimen Description URINE, CATHETERIZED   Final    Special Requests NONE   Final    Culture  Setup Time [id number]   Final    Colony Count NO GROWTH   Final    Culture NO GROWTH   Final    Report Status 06/03/2011 FINAL   Final   CULTURE, SPUTUM-ASSESSMENT     Status: Normal   Collection Time   06/02/11  5:13 PM      Component Value Range Status Comment   Specimen Description SPUTUM   Final    Special Requests NONE   Final    Sputum evaluation     Final    Value: THIS SPECIMEN IS ACCEPTABLE. RESPIRATORY CULTURE REPORT TO FOLLOW.   Report Status 06/02/2011 FINAL   Final   CULTURE, RESPIRATORY     Status: Normal   Collection Time   06/02/11  5:13 PM      Component Value Range Status Comment   Specimen Description SPUTUM   Final    Special Requests NONE   Final    Gram Stain     Final    Value: FEW WBC PRESENT,BOTH PMN AND MONONUCLEAR     RARE SQUAMOUS EPITHELIAL CELLS PRESENT     RARE GRAM POSITIVE COCCI IN PAIRS   Culture FEW STREPTOCOCCUS,BETA HEMOLYIC NOT GROUP A   Final    Report Status 06/06/2011 FINAL   Final     Studies/Results: Dg Chest Port 1 View  06/02/2011  *RADIOLOGY REPORT*  Clinical Data: Chest tightness.  Shortness of breath.  Cough. Pacemaker.  Hypertension.  PORTABLE CHEST - 1 VIEW  Comparison: 03/31/2011  Findings: Pulmonary vascular indistinctness noted with interstitial accentuation and faint right infrahilar and left lower lobe airspace opacity.  Mild cardiomegaly noted with atherosclerotic calcification of  the aortic arch.  Dual lead pacer remains in place.  IMPRESSION:  1.  Cardiomegaly with vascular indistinctness and interstitial opacity favoring interstitial pulmonary edema.  There may be faint airspace edema in the right infrahilar and left lower lobe regions. Superimposed pneumonia is not completely excluded.  Original Report Authenticated By: Dellia Cloud, M.D.    Medications: I have reviewed the patient's current medications. Scheduled Meds:    . azithromycin  250 mg Oral Daily  . bumetanide  1 mg Oral Daily  . calcium-vitamin D  1 tablet Oral BID WC  . diltiazem  300 mg Oral Daily  . docusate sodium  100 mg Oral Daily  . feeding supplement  237 mL Oral TID WC  . feeding supplement  30 mL Oral BID WC  . pantoprazole  40 mg Oral Q1200  . polysaccharide iron  150 mg Oral Q breakfast  . potassium  chloride  40 mEq Oral Daily  . ramipril  10 mg Oral Daily  . sodium chloride  3 mL Intravenous Q12H  . sotalol  160 mg Oral Q12H  . warfarin  3 mg Oral q1800  . Warfarin - Pharmacist Dosing Inpatient   Does not apply q1800   Continuous Infusions:  PRN Meds:.sodium chloride, acetaminophen, guaiFENesin-codeine, ipratropium, levalbuterol, ondansetron (ZOFRAN) IV, pramoxine-mineral oil-zinc, sodium chloride Assessment/Plan: Patient Active Hospital Problem List: Diastolic CHF, acute on chronic (03/27/2011)   Assessment: Clinically the patient appears to be in good fluid balance. She has been placed on Bumex 1 mg by mouth daily as she has an allergy to Lasix. The patient has had an appropriate weight loss in terms of fluid during his hospitalization. However I would continue to keep track of the patient's weights now that she's on oral diuretic.                                      Hypertension (05/31/2010)   Assessment: Blood pressures are well controlled.    Atrial fibrillation (05/31/2010)   Assessment: The patient's heart rates have been well controlled up until last night when she  had a significant run of rapid ventricular response with atrial fibrillation. The patient was seen by Dr. Carolanne Grumbling from cardiology who recommended the patient be seen by electrophysiology specialist for further evaluation before being discharged home.  Anemia (03/27/2011)   Assessment: The patient has an iron deficiency anemia for which she has received a transfusion of one unit of packed red blood cells as being hospitalized. Her hemoglobin has remained stable at about 10.1-10.3. Certainly agree that it is important for this patient to have an anemia work up however is not necessary to pursue workup in the hospital. I will defer to her primary care physician for followup with Gen. surgery and further workup to and including a hematology workup if that is warranted in her estimation. The patient has a baseline reticulocytes, done prior to her iron infusion.    Cancer of sigmoid colon s/p sigmoid colectomy for T3N0 lesion on 1/18 (04/08/2011)   Assessment: The patient had a removal of a cancerous polyp I most recently. The patient is followed by Gen. surgery for their postsurgical followup. It is unclear as to the recommendations by gastroenterology however again I will defer to primary care physician to set up the surveillance management of this patient with colon cancer.      Acute tracheobronchitis (06/08/2011)   Assessment: The patient had some tightness in her chest which resolved with beta agonists. She also has some clear to yellowish sputum this morning. Patient is on day #2 of azithromycin and seems to be doing well. I will also continue her nebulized albuterol on a when necessary basis. Sputum cultures are pending  Disposition: Hopefully home in the next 24 hours.    LOS: 7 days

## 2011-06-09 NOTE — Progress Notes (Signed)
ANTICOAGULATION CONSULT NOTE - Follow Up Consult  Pharmacy Consult for coumadin Indication: atrial fibrillation  Vital Signs: Temp: 97.9 F (36.6 C) (03/26 0425) Temp src: Oral (03/26 0425) BP: 125/73 mmHg (03/26 0425) Pulse Rate: 64  (03/26 0425)  Labs:  Alvira Philips 06/08/11 0645 06/06/11 0644  HGB -- 10.1*  HCT -- 33.3*  PLT -- 357  APTT -- --  LABPROT 24.5* --  INR 2.16* --  HEPARINUNFRC -- --  CREATININE -- 0.83   Estimated Creatinine Clearance: 51.7 ml/min (by C-G formula based on Cr of 0.83).  Assessment: 88 yof on chronic coumadin for atrial fibrillation. INR today is 2.16 and within desired goal range.  Only checking INRs on MWF since pt has been stable on her home dose of 3mg  daily. Pt anemic s/p iron dextran and daily iron supplementation with improvement in her H/H today. Heme positive stools last night.  Stools have been consistently positive the last few days.  Goal of Therapy:  INR 2-3   Plan:  1. Continue coumadin 3mg  PO daily 2. Continue to check INRs only on MWF 3. Monitor for s&s of bleeding 4. F/u anemia work-up  Nadara Mustard, PharmD., MS Clinical Pharmacist Pager:  [phone] 06/09/2011,8:44 AM

## 2011-06-09 NOTE — Progress Notes (Signed)
Cardiac Rehab 1400 Came to walk with pt. Just walked with NT. We will  Continue to  Follow. Keegen Heffern DunlapRN

## 2011-06-10 ENCOUNTER — Telehealth (INDEPENDENT_AMBULATORY_CARE_PROVIDER_SITE_OTHER): Payer: Self-pay | Admitting: General Surgery

## 2011-06-10 DIAGNOSIS — I4892 Unspecified atrial flutter: Secondary | ICD-10-CM

## 2011-06-10 DIAGNOSIS — Z950 Presence of cardiac pacemaker: Secondary | ICD-10-CM

## 2011-06-10 DIAGNOSIS — I495 Sick sinus syndrome: Secondary | ICD-10-CM

## 2011-06-10 LAB — PROTIME-INR
INR: 2.16 — ABNORMAL HIGH (ref 0.00–1.49)
Prothrombin Time: 24.5 seconds — ABNORMAL HIGH (ref 11.6–15.2)

## 2011-06-10 MED ORDER — POTASSIUM CHLORIDE CRYS ER 20 MEQ PO TBCR: 20.0000 meq | EXTENDED_RELEASE_TABLET | Freq: Every day | ORAL | Status: DC

## 2011-06-10 MED ORDER — ALBUTEROL SULFATE HFA 108 (90 BASE) MCG/ACT IN AERS
2.0000 | INHALATION_SPRAY | RESPIRATORY_TRACT | Status: DC | PRN
  Administered 2011-06-10: 2 via RESPIRATORY_TRACT
  Filled 2011-06-10: qty 6.7

## 2011-06-10 MED ORDER — AZITHROMYCIN 500 MG PO TABS: 500.0000 mg | ORAL_TABLET | Freq: Every day | ORAL | Status: AC

## 2011-06-10 MED ORDER — BUMETANIDE 1 MG PO TABS: 1.0000 mg | ORAL_TABLET | Freq: Every day | ORAL | Status: DC

## 2011-06-10 MED ORDER — ALBUTEROL SULFATE HFA 108 (90 BASE) MCG/ACT IN AERS: 2.0000 | INHALATION_SPRAY | Freq: Four times a day (QID) | RESPIRATORY_TRACT | Status: DC | PRN

## 2011-06-10 NOTE — Discharge Summary (Signed)
Admit date: 06/02/2011 Discharge date: 06/10/2011  Primary Care Physician:  Rudi Heap, MD, MD   Discharge Diagnoses:   Active Hospital Problems  Diagnoses Date Noted   . Cancer of sigmoid colon s/p sigmoid colectomy for T3N0 lesion on 1/18 04/08/2011   . Anemia 03/27/2011   . Atrial flutter with rapid ventricular response 03/27/2011   . Hypertension 05/31/2010   . Atrial fibrillation 05/31/2010   . Pacemaker 05/31/2010     Resolved Hospital Problems  Diagnoses Date Noted Date Resolved  . Diastolic CHF, acute on chronic 03/27/2011 06/10/2011  . Acute bronchitis 06/09/2011 06/10/2011     DISCHARGE MEDICATION: Medication List  As of 06/10/2011  2:49 PM   TAKE these medications         albuterol 108 (90 BASE) MCG/ACT inhaler   Commonly known as: PROVENTIL HFA;VENTOLIN HFA   Inhale 2 puffs into the lungs every 6 (six) hours as needed for wheezing.      azithromycin 500 MG tablet   Commonly known as: ZITHROMAX   Take 1 tablet (500 mg total) by mouth daily.      bumetanide 1 MG tablet   Commonly known as: BUMEX   Take 1 tablet (1 mg total) by mouth daily.      calcium-vitamin D 500-200 MG-UNIT per tablet   Commonly known as: OSCAL WITH D   Take 1 tablet by mouth 2 (two) times daily.      diltiazem 240 MG 24 hr capsule   Commonly known as: CARDIZEM CD   Take 240 mg by mouth daily.      feeding supplement Liqd   Take 30 mLs by mouth 2 (two) times daily.      feeding supplement Liqd   Take 237 mLs by mouth 3 (three) times daily with meals.      pantoprazole 40 MG tablet   Commonly known as: PROTONIX   Take 40 mg by mouth daily.      polysaccharide iron 150 MG Caps capsule   Commonly known as: NIFEREX   Take 150 mg by mouth every morning.      potassium chloride SA 20 MEQ tablet   Commonly known as: K-DUR,KLOR-CON   Take 1 tablet (20 mEq total) by mouth daily.      PROLIA 60 MG/ML Soln injection   Generic drug: denosumab   Inject 60 mg into the skin every 6  (six) months.      ramipril 10 MG capsule   Commonly known as: ALTACE   Take 10 mg by mouth daily.      sotalol 160 MG tablet   Commonly known as: BETAPACE   Take 160 mg by mouth 2 (two) times daily.      warfarin 3 MG tablet   Commonly known as: COUMADIN   Take 3 mg by mouth daily.              Consults: Treatment Team:  Corky Crafts, MD   SIGNIFICANT DIAGNOSTIC STUDIES:  Dg Chest 2 View  06/09/2011  *RADIOLOGY REPORT*  Clinical Data: Cough.  CHEST - 2 VIEW  Comparison: 06/06/2011  Findings: There is a left chest wall pacer device with leads in the right atrial appendage and right ventricle.  The heart size appears normal.  There is no pleural effusion or pulmonary edema.  There is no airspace consolidation identified.  The lungs are hyperinflated and there are coarsened interstitial markings noted bilaterally.  IMPRESSION:  1.  No acute cardiopulmonary abnormalities. 2.  Chronic interstitial changes.  Original Report Authenticated By: Rosealee Albee, M.D.   Dg Chest 2 View  06/06/2011  *RADIOLOGY REPORT*  Clinical Data: Cough.  Congestive heart failure.  CHEST - 2 VIEW  Comparison: CT chest 03/31/2011.  Plain films of the chest 06/02/2011, 03/27/2011 and 05/29/2004.  Findings: There is cardiomegaly.  Pulmonary edema seen on the prior study has resolved.  Coarsening of the pulmonary interstitium is noted.  The patient has a tiny left pleural effusion.  IMPRESSION:  1.  Interval resolution of pulmonary edema with a tiny residual left effusion noted. 2.  Cardiomegaly and chronic coarsening of the pulmonary interstitium.  Original Report Authenticated By: Bernadene Bell. Maricela Curet, M.D.   Dg Chest Port 1 View  06/02/2011  *RADIOLOGY REPORT*  Clinical Data: Chest tightness.  Shortness of breath.  Cough. Pacemaker.  Hypertension.  PORTABLE CHEST - 1 VIEW  Comparison: 03/31/2011  Findings: Pulmonary vascular indistinctness noted with interstitial accentuation and faint right  infrahilar and left lower lobe airspace opacity.  Mild cardiomegaly noted with atherosclerotic calcification of the aortic arch.  Dual lead pacer remains in place.  IMPRESSION:  1.  Cardiomegaly with vascular indistinctness and interstitial opacity favoring interstitial pulmonary edema.  There may be faint airspace edema in the right infrahilar and left lower lobe regions. Superimposed pneumonia is not completely excluded.  Original Report Authenticated By: Dellia Cloud, M.D.     ECHO: - Left ventricle: The cavity size was normal. Systolic function was moderately to severely reduced. The estimated ejection fraction was in the range of 30% to 35%. - Mitral valve: Calcified annulus. Mildly thickened leaflets  Moderate regurgitation.  Recent Results (from the past 240 hour(s))  URINE CULTURE     Status: Normal   Collection Time   06/02/11 10:00 AM      Component Value Range Status Comment   Specimen Description URINE, CATHETERIZED   Final    Special Requests NONE   Final    Culture  Setup Time [id number]   Final    Colony Count NO GROWTH   Final    Culture NO GROWTH   Final    Report Status 06/03/2011 FINAL   Final   CULTURE, SPUTUM-ASSESSMENT     Status: Normal   Collection Time   06/02/11  5:13 PM      Component Value Range Status Comment   Specimen Description SPUTUM   Final    Special Requests NONE   Final    Sputum evaluation     Final    Value: THIS SPECIMEN IS ACCEPTABLE. RESPIRATORY CULTURE REPORT TO FOLLOW.   Report Status 06/02/2011 FINAL   Final   CULTURE, RESPIRATORY     Status: Normal   Collection Time   06/02/11  5:13 PM      Component Value Range Status Comment   Specimen Description SPUTUM   Final    Special Requests NONE   Final    Gram Stain     Final    Value: FEW WBC PRESENT,BOTH PMN AND MONONUCLEAR     RARE SQUAMOUS EPITHELIAL CELLS PRESENT     RARE GRAM POSITIVE COCCI IN PAIRS   Culture FEW STREPTOCOCCUS,BETA HEMOLYIC NOT GROUP A   Final    Report  Status 06/06/2011 FINAL   Final     BRIEF ADMITTING H & P: 78 year old female with history of diastolic CHF, awaiting cancer, hypertension, atrial fibrillation on Coumadin, pacemaker for tachybradycardia syndrome, recent open sigmoid colectomy for sigmoid colon cancer in  January 2013 presented from home with shortness of breath and feeling congested today. History was obtained from the patient who states that she was in her usual state of health until yesterday, woke up with 'smothering feeling' and shortness of breath. She try to check her weight, however felt dizzy and lightheaded and sat down. She denied any syncopal episode. She denied any chest pain, fevers or chills, nausea or vomiting. She did admit to having productive cough in the last few days. She denies any worsening orthopnea or PND.   Active Hospital Problems  Diagnoses Date Noted   . Cancer of sigmoid colon s/p sigmoid colectomy for T3N0 lesion on 1/18: The patient is followed by Gen. surgery for their postsurgical followup. It is unclear as to the recommendations by gastroenterology however again I will defer to primary care physician to set up the surveillance management of this patient with colon cancer  04/08/2011   . Anemia: iron deficiency anemia for which she has received a transfusion of one unit of packed red blood cells as being hospitalized. Her hemoglobin has remained stable at about 10.1-10.3. Certainly agree that it is important for this patient to have an anemia work up however is not necessary to pursue workup in the hospital. I will defer to her primary care physician for followup with Gen. surgery and further workup to and including a hematology workup if that is warranted in her estimation  03/27/2011   . Atrial flutter/ a. fibwith rapid ventricular response: cardiology recommended for the patient to stay in the hospital for further evaluation by electrophysiology specialist here. Tachy/brady- device has reached ERI.  We have reprogrammed to promote AV conduction today. I will plan to see in the office in 2 weeks and then replace her generator in 4-6 weeks 03/27/2011   . Hypertension: Stable follow up with PCP. 05/31/2010   . Pacemaker: See atrial flutter. 05/31/2010     Resolved Hospital Problems  Diagnoses Date Noted Date Resolved  . Diastolic CHF, acute on chronic: Initially on admission she seem to be fluid overloaded. She was started on bumex IV she diurese well. She has is about 5.0L negative totol during her hospital stay. Her weight decrease from 82.64 on admission to 78.8. She will go home on Ace inhibitor, diuretic and sotalol. 03/27/2011 06/10/2011  . Acute bronchitis: Initially a diagnosis of patient's thought to have a pneumonia however in retrospect clinical course and radiologic findings did not support pneumonia and death the antibiotics were stopped after 24 hours. From an infectious disease standpoint the patient has had a fairly uneventful course off of the antibiotics. She has not had an elevated white blood cell count nor has she had any fevers. The patient does however appear to have an acute bronchitis which was developed in the last 48 hour with some wheezing and clear to yellowish sputum production. Thus the patient was started on a azithromycin, in addition to when necessary albuterol. No steroids have been initiated as the patient's oxygen saturations have been normal on room air appear 06/09/2011 06/10/2011     Disposition and Follow-up:   Discharge Orders    Future Appointments: Provider: Department: Dept Phone: Center:   06/29/2011 9:45 AM Hillis Range, MD [facility] [phone] [facility]     Future Orders Please Complete By Expires   Diet - low sodium heart healthy      Increase activity slowly        Follow-up Information    Follow up with Rudi Heap,  MD in 1 week.      Follow up with Hillis Range, MD in 4 weeks.   Contact information:   [address],  [address] [place] [address] [phone]           DISCHARGE EXAM:  General: Alert, awake, oriented x3, in no acute distress.  HEENT: [place]/AT PEERL, EOMI  Neck: Trachea midline, no masses, no thyromegal,y no JVD, no carotid bruit  OROPHARYNX: Moist, No exudate/ erythema/lesions.  Heart: Regular rate and rhythm, without murmurs, rubs, gallops, PMI non-displaced, no heaves or thrills on palpation.  Lungs: Mild transmitted upper airway transmitted sounds. No crackles, no wheezing or rhonchi noted.  Abdomen: Soft, nontender, nondistended, positive bowel sounds, no masses no hepatosplenomegaly noted..   Blood pressure 97/60, pulse 65, temperature 97.8 F (36.6 C), temperature source Oral, resp. rate 18, height 5\' 6"  (1.676 m), weight 78.8 kg (173 lb 11.6 oz), SpO2 96.00%.   Basename 06/09/11 0955  NA 137  K 4.1  CL 96  CO2 35*  GLUCOSE 168*  BUN 29*  CREATININE 0.88  CALCIUM 9.5  MG --  PHOS --   No results found for this basename: AST:2,ALT:2,ALKPHOS:2,BILITOT:2,PROT:2,ALBUMIN:2 in the last 72 hours No results found for this basename: LIPASE:2,AMYLASE:2 in the last 72 hours  Basename 06/09/11 0955  WBC 8.0  NEUTROABS 5.3  HGB 10.3*  HCT 34.4*  MCV 84.5  PLT 310    Signed: Marinda Elk M.D. 06/10/2011, 2:49 PM

## 2011-06-10 NOTE — Discharge Instructions (Signed)

## 2011-06-10 NOTE — Progress Notes (Signed)
CARDIAC REHAB PHASE I   PRE:  Rate/Rhythm: 65 Pacing  BP:  Supine:   Sitting: 100/60  Standing:    SaO2: 93-94 RA  MODE:  Ambulation: 380 ft   POST:  Rate/Rhythem: 69  BP:  Supine:   Sitting: 104/70  Standing:    SaO2: 96 RA 1125-1155 Assisted X 1 and used walker to ambulate. Gait steady with walker. No c/o of cp or SOB with walking. RA sats in the 90's Back to recliner after walk. Gave pt some walking guidelines to use at home.  Beatrix Fetters

## 2011-06-10 NOTE — Progress Notes (Signed)
ANTICOAGULATION CONSULT NOTE - Follow Up Consult  Pharmacy Consult for coumadin Indication: atrial fibrillation  Allergies  Allergen Reactions  . Lasix (Furosemide) Swelling  . Penicillins Swelling    Mouth swelling and redness  . Sulfa Antibiotics Rash    Patient Measurements: Height: 5\' 6"  (167.6 cm) Weight: 173 lb 11.6 oz (78.8 kg) (b scale) IBW/kg (Calculated) : 59.3  Heparin Dosing Weight:   Vital Signs: Temp: 97.8 F (36.6 C) (03/27 1340) Temp src: Oral (03/27 1340) BP: 97/60 mmHg (03/27 1340) Pulse Rate: 65  (03/27 1340)  Labs:  Basename 06/10/11 0639 06/09/11 0955 06/08/11 0645  HGB -- 10.3* --  HCT -- 34.4* --  PLT -- 310 --  APTT -- -- --  LABPROT 24.5* -- 24.5*  INR 2.16* -- 2.16*  HEPARINUNFRC -- -- --  CREATININE -- 0.88 --  CKTOTAL -- -- --  CKMB -- -- --  TROPONINI -- -- --   Estimated Creatinine Clearance: 48.6 ml/min (by C-G formula based on Cr of 0.88).   Medications:  Scheduled:    . azithromycin  250 mg Oral Daily  . bumetanide  1 mg Oral Daily  . calcium-vitamin D  1 tablet Oral BID WC  . diltiazem  300 mg Oral Daily  . docusate sodium  100 mg Oral Daily  . feeding supplement  237 mL Oral TID WC  . feeding supplement  30 mL Oral BID WC  . pantoprazole  40 mg Oral Q1200  . polysaccharide iron  150 mg Oral Q breakfast  . potassium chloride  40 mEq Oral Daily  . ramipril  10 mg Oral Daily  . sodium chloride  3 mL Intravenous Q12H  . sotalol  160 mg Oral Q12H  . warfarin  3 mg Oral q1800  . Warfarin - Pharmacist Dosing Inpatient   Does not apply q1800    Assessment: 88 yof on chronic coumadin for atrial fibrillation. INR today is 2.16 and within desired goal range. Only checking INRs on MWF since pt has been stable on her home dose of 3mg  daily. Pt anemic s/p iron dextran and daily iron supplementation. Heme positive stools noted 3/25. Stools have been consistently positive the last few days. NO CBC today  Goal of Therapy:  INR  2-3   Plan:  1. INR therapeutic, continue coumadin 3mg  daily and INR MWF.  Dannielle Huh 06/10/2011,2:50 PM

## 2011-06-10 NOTE — Telephone Encounter (Signed)
FYI only:  Pt's daughter, Sharol Given at [phone], calling to let Dr. [name] be aware his patient from January (colon Ca) is back in the hospital at [facility], room [phone].  Daughter is at home in [place], but wanted Dr. [name] to be aware and maybe visit her.  She has had a "rough time" since he discharged her.  She fought off C.diff while at rehab and was only home a day or so before becoming short of breath and being readmitted to her hospital.

## 2011-06-10 NOTE — Progress Notes (Signed)
Physical Therapy Treatment Patient Details Name: [name] MRN: [id number] DOB: [birth year] Today's Date: 06/10/2011  PT Assessment/Plan  PT - Assessment/Plan Comments on Treatment Session: Ready to D/C home from a mobility stand point.  Son has worked it out for pt to have help most of the day.  Recommend HHPT home safety eval and treat. PT Plan: Discharge plan remains appropriate Follow Up Recommendations: Home health PT Equipment Recommended: None recommended by PT PT Goals  Acute Rehab PT Goals PT Goal: Sit to Stand - Progress: Met PT Goal: Stand to Sit - Progress: Met PT Transfer Goal: Bed to Chair/Chair to Bed - Progress: Met PT Goal: Ambulate - Progress: Met  PT Treatment Precautions/Restrictions  Precautions Precautions: Fall Precaution Comments: none Restrictions Weight Bearing Restrictions: No Mobility (including Balance) Bed Mobility Bed Mobility: No Transfers Transfers: Yes Sit to Stand: 6: Modified independent (Device/Increase time) Sit to Stand Details (indicate cue type and reason): safe use of armrest and cane Stand to Sit: 6: Modified independent (Device/Increase time) Ambulation/Gait Ambulation/Gait: Yes Ambulation/Gait Assistance: 6: Modified independent (Device/Increase time) Ambulation/Gait Assistance Details (indicate cue type and reason): appropriately safe use of the standard cane Ambulation Distance (Feet): 150 Feet Assistive device: Straight cane Gait Pattern: Within Functional Limits Stairs: Yes Stairs Assistance: 6: Modified independent (Device/Increase time) Stair Management Technique: One rail Right;Step to pattern;Seated/boosting;With cane Number of Stairs: 3   Posture/Postural Control Posture/Postural Control: No significant limitations Balance Balance Assessed: No Exercise    End of Session PT - End of Session Activity Tolerance: Patient tolerated treatment well Patient left: in chair;with call bell in reach Nurse Communication:  Mobility status for ambulation General Behavior During Session: [facility] for tasks performed Cognition: [facility] for tasks performed  Atoya Andrew, Eliseo Gum 06/10/2011, 3:44 PM  06/10/2011  [place] Bing, PT [phone] (pager)

## 2011-06-10 NOTE — Consult Note (Signed)
ELECTROPHYSIOLOGY CONSULT NOTE    Patient ID: TAHEERAH GULDIN MRN: [id number], DOB/AGE: 1922/06/20 78 y.o.  Admit date: 06/02/2011 Date of Consult: 06-10-2011  Primary Physician: Rudi Heap, MD, MD Primary Cardiologist: Everette Rank, MD  Reason for Consultation: atrial arrhythmias  HPI:  [name] is a 78 year old female with a history of ovarian cancer, hypertension, atrial fibrillation with tachy brady syndrome s/p pacemaker and diastolic heart failure.  She has been in and out of the hospital since December 23 of last year with various problems but most recently was admitted 3-19 for acute on chronic diastolic heart failure.  On telemetry she has been noted to have periods of atrial flutter with RVR.  EP has been asked to evaluate for treatment options.  She states that she was first diagnosed with tachy brady syndrome in 2006 at the time of her surgery for ovarian cancer.  She recalls a cardioversion around that time for atrial fibrillation. Dr Ty Hilts implanted a pacemaker at that time and she has done relatively well, living independently at home with only modest shortness of breath.  Since being hospitalized in December of last year and ongoing, she has had increased shortness of breath and exercise intolerance.    Interrogation of her pacemaker is outlined below, but in brief, demonstrates that she has reached elective replacement indicator and had reverted to VVI 65 which has resulted in ventricular pacing.  This has been reprogrammed today to DDDR and she has intrinsic ventricular conduction.  She has had atrial arrhythmias 23% of the time since last interrogation (October 2012-present).  Her activity level has significantly declined since January of this year.    Currently, she is on Sotalol 160mg  bid and Diltiazem 300mg  daily for rhythm control. She is unclear in her history as to how long she has been on these medications however echart suggests that she has been on sotalol since  at least 2009.   Past Medical History  Diagnosis Date  . Diastolic heart failure   . Ovarian cancer   . Hypertension   . Atrial fibrillation   . Tachy-brady syndrome   . CHF (congestive heart failure)     diastolic heart failure  . Colon cancer   . Pacemaker- Medtronic 2006  . Arthritis   . Shortness of breath   . Pneumonia   . Anemia      Surgical History:  Past Surgical History  Procedure Date  . Abdominal hysterectomy   . Oophorectomy   . Colonoscopy 03/30/2011    Procedure: COLONOSCOPY;  Surgeon: Barrie Folk, MD;  Location: [facility] ENDOSCOPY;  Service: Endoscopy;  Laterality: N/A;  . Esophagogastroduodenoscopy 03/30/2011    Procedure: ESOPHAGOGASTRODUODENOSCOPY (EGD);  Surgeon: Barrie Folk, MD;  Location: [facility] ENDOSCOPY;  Service: Endoscopy;  Laterality: N/A;  . Colon resection 04/03/2011    Procedure: COLON RESECTION LAPAROSCOPIC;  Surgeon: Atilano Ina, MD;  Location: [facility] OR;  Service: General;  Laterality: N/A;  Laparoscopic, turned open sigmoid colon resection, lysis of adhesions x 2.5 hours, rigid proctoscopy.  . Pacemaker placement      Prescriptions prior to admission  Medication Sig Dispense Refill  . calcium-vitamin D (OSCAL WITH D) 500-200 MG-UNIT per tablet Take 1 tablet by mouth 2 (two) times daily.      Marland Kitchen denosumab (PROLIA) 60 MG/ML SOLN Inject 60 mg into the skin every 6 (six) months.       . diltiazem (CARDIZEM CD) 240 MG 24 hr capsule Take 240 mg by  mouth daily.      . feeding supplement (ENSURE IMMUNE HEALTH) LIQD Take 237 mLs by mouth 3 (three) times daily with meals.      . feeding supplement (PRO-STAT SUGAR FREE 64) LIQD Take 30 mLs by mouth 2 (two) times daily.      . pantoprazole (PROTONIX) 40 MG tablet Take 40 mg by mouth daily.      . polysaccharide iron (NIFEREX) 150 MG CAPS capsule Take 150 mg by mouth every morning.       . ramipril (ALTACE) 10 MG capsule Take 10 mg by mouth daily.        . sotalol (BETAPACE) 160 MG tablet Take 160 mg by mouth 2 (two)  times daily.      Marland Kitchen warfarin (COUMADIN) 3 MG tablet Take 3 mg by mouth daily.        Inpatient Medications:    . azithromycin  250 mg Oral Daily  . bumetanide  1 mg Oral Daily  . calcium-vitamin D  1 tablet Oral BID WC  . diltiazem  300 mg Oral Daily  . docusate sodium  100 mg Oral Daily  . feeding supplement  237 mL Oral TID WC  . feeding supplement  30 mL Oral BID WC  . pantoprazole  40 mg Oral Q1200  . polysaccharide iron  150 mg Oral Q breakfast  . potassium chloride  40 mEq Oral Daily  . ramipril  10 mg Oral Daily  . sotalol  160 mg Oral Q12H  . warfarin  3 mg Oral q1800    Allergies:  Allergies  Allergen Reactions  . Lasix (Furosemide) Swelling  . Penicillins Swelling    Mouth swelling and redness  . Sulfa Antibiotics Rash    History   Social History  . Marital Status: Widowed    Spouse Name: N/A    Number of Children: 3  . Years of Education: N/A   Social History Main Topics  . Smoking status: Never Smoker   . Smokeless tobacco: Never Used  . Alcohol Use: No  . Drug Use: No  . Sexually Active: No   Other Topics Concern  . Not on file   Social History Narrative  . No narrative on file     Family History  Problem Relation Age of Onset  . Anesthesia problems Neg Hx   . Hypotension Neg Hx   . Malignant hyperthermia Neg Hx   . Pseudochol deficiency Neg Hx   . Cancer Mother     ovarian    Physical Exam: Filed Vitals:   06/09/11 2132 06/10/11 0313 06/10/11 0426 06/10/11 1340  BP: 119/72  145/72 97/60  Pulse: 63  65 65  Temp: 97.3 F (36.3 C)  97.6 F (36.4 C) 97.8 F (36.6 C)  TempSrc:    Oral  Resp: 18  18 18   Height:      Weight:   173 lb 11.6 oz (78.8 kg)   SpO2: 94% 97% 99% 96%    GEN- The patient is elderly appearing, alert and oriented x 3 today.   Head- normocephalic, atraumatic Eyes-  Sclera clear, conjunctiva pink Ears- hearing intact Oropharynx- clear Neck- supple  Lungs- Clear to ausculation bilaterally, normal work of  breathing Chest- pacemaker pocket is well healed Heart- Regular rate and rhythm  GI- soft, NT, ND, + BS Extremities- no clubbing, cyanosis, or edema MS-  Age appropriate atrophy Skin- no rash or lesion Psych- euthymic mood, full affect Neuro- strength and sensation are intact  Pacemaker interrogation- reviewed in detail today,  See PACEART report  Labs:   Lab Results  Component Value Date   WBC 8.0 06/09/2011   HGB 10.3* 06/09/2011   HCT 34.4* 06/09/2011   MCV 84.5 06/09/2011   PLT 310 06/09/2011    Lab 06/09/11 0955  NA 137  K 4.1  CL 96  CO2 35*  BUN 29*  CREATININE 0.88  CALCIUM 9.5  PROT --  BILITOT --  ALKPHOS --  ALT --  AST --  GLUCOSE 168*    Radiology/Studies: Dg Chest 2 View 06/09/2011  *RADIOLOGY REPORT*  Clinical Data: Cough.  CHEST - 2 VIEW  Comparison: 06/06/2011  Findings: There is a left chest wall pacer device with leads in the right atrial appendage and right ventricle.  The heart size appears normal.  There is no pleural effusion or pulmonary edema.  There is no airspace consolidation identified.  The lungs are hyperinflated and there are coarsened interstitial markings noted bilaterally.  IMPRESSION:  1.  No acute cardiopulmonary abnormalities. 2.  Chronic interstitial changes.  Original Report Authenticated By: Rosealee Albee, M.D.   EKG: Ventricular pacing at 65bpm   TELEMETRY: V pacing at 65 with brief periods of atrial arrhythmias with RVR  ECHO: 06-02-2011- EF 30-35%, moderate MR, Takatsubo cardiomyopathy.  LA- 50mm  Device Interrogation:  Device Manufacturer: Medtronic Model Number: [id number]  DOI: May 28, 2004 Implanting physician: Marlowe Aschoff  Device following physician: Everette Rank  Battery Voltage: 2.81 (ERI)       RA Lead (5076) RV Lead (5092)    Amplitude 1.9 9.6  Impedence [id number]  Threshold 1.0V @ 0.101msec 1.25V @ 0.8msec  HV impedence     Episodes:  High Atrial rates: 217, 23.3% since last interrogation.  Most appear to be  atrial flutter with RVR    High Ventricular rates: 10- all atrial flutter with RVR   Programmed parameters:  Huston Foley parameters:  Mode: MVP (R) Lower Rate: 65 Upper rate: 120  PAV: 180   SAV: 150   Changes this session: Atrial therapies programmed off since at ERI, device reprogrammed to MVP (R) from VVI 65.  Assessment and Plan:  1.  Tachy/brady- device has reached ERI.  We have reprogrammed to promote AV conduction today.  I will plan to see in the office in 2 weeks and then replace her generator in 4-6 weeks  2. Afib/ atrial flutter- given reprogramming of her device, I am hopeful that her arrhythmia burden would improve.  Continue current medicine regimen.  If further afib/ atrial flutter, then switch to amiodarone at that point.  OK to discharge from an EP standpoint I will see in the office 06/29/11. Will see as needed while here. Carla Fearing Rhett Najera,MD 06/10/2011 2:17 PM

## 2011-06-29 ENCOUNTER — Encounter: Payer: Medicare Other | Admitting: Internal Medicine

## 2011-07-09 ENCOUNTER — Encounter (INDEPENDENT_AMBULATORY_CARE_PROVIDER_SITE_OTHER): Payer: Self-pay

## 2011-07-15 ENCOUNTER — Encounter: Payer: Self-pay | Admitting: Internal Medicine

## 2011-07-29 ENCOUNTER — Encounter: Payer: Self-pay | Admitting: *Deleted

## 2011-07-29 ENCOUNTER — Ambulatory Visit (INDEPENDENT_AMBULATORY_CARE_PROVIDER_SITE_OTHER): Payer: Medicare Other | Admitting: Internal Medicine

## 2011-07-29 ENCOUNTER — Encounter: Payer: Self-pay | Admitting: Internal Medicine

## 2011-07-29 VITALS — BP 104/64 | HR 65 | Ht 68.0 in | Wt 175.8 lb

## 2011-07-29 DIAGNOSIS — I4891 Unspecified atrial fibrillation: Secondary | ICD-10-CM

## 2011-07-29 DIAGNOSIS — I10 Essential (primary) hypertension: Secondary | ICD-10-CM

## 2011-07-29 DIAGNOSIS — I495 Sick sinus syndrome: Secondary | ICD-10-CM

## 2011-07-29 NOTE — Patient Instructions (Signed)
Pacemaker generator change out on 08/04/11

## 2011-07-30 ENCOUNTER — Encounter (HOSPITAL_COMMUNITY): Payer: Self-pay | Admitting: Pharmacy Technician

## 2011-07-30 ENCOUNTER — Encounter: Payer: Self-pay | Admitting: Internal Medicine

## 2011-07-30 DIAGNOSIS — I495 Sick sinus syndrome: Secondary | ICD-10-CM | POA: Insufficient documentation

## 2011-07-30 LAB — CBC WITH DIFFERENTIAL/PLATELET
Basophils Absolute: 0.1 10*3/uL (ref 0.0–0.1)
Basophils Relative: 0.9 % (ref 0.0–3.0)
Eosinophils Absolute: 0.1 10*3/uL (ref 0.0–0.7)
Hemoglobin: 10.9 g/dL — ABNORMAL LOW (ref 12.0–15.0)
Lymphocytes Relative: 31.6 % (ref 12.0–46.0)
MCHC: 32.4 g/dL (ref 30.0–36.0)
Monocytes Relative: 10.2 % (ref 3.0–12.0)
Neutro Abs: 3.2 10*3/uL (ref 1.4–7.7)
Neutrophils Relative %: 56 % (ref 43.0–77.0)
RBC: 3.8 Mil/uL — ABNORMAL LOW (ref 3.87–5.11)

## 2011-07-30 LAB — BASIC METABOLIC PANEL
Calcium: 9.4 mg/dL (ref 8.4–10.5)
Creatinine, Ser: 1.1 mg/dL (ref 0.4–1.2)
GFR: 47.92 mL/min — ABNORMAL LOW (ref >60.00)
Sodium: 141 mEq/L (ref 135–145)

## 2011-07-30 LAB — PROTIME-INR: INR: 1.8 ratio — ABNORMAL HIGH (ref 0.8–1.0)

## 2011-07-30 NOTE — Progress Notes (Signed)
Bennie Pierini, FNP, FNP: Primary Cardiologist:  Dr Louretta Shorten [name] is a 78 y.o. female with a h/o tachycardia/ bradycardia sp PPM (MDT) by Dr Amil Amen in 2008 who presents today to establish care in the Electrophysiology device clinic.  The patient reports doing very well since having a pacemaker implanted and remains very active despite her age.   Today, she  denies symptoms of palpitations, chest pain, shortness of breath, orthopnea, PND, lower extremity edema, dizziness, presyncope, syncope, or neurologic sequela.  Her pacemaker has reached ERI battery status.  The patientis tolerating medications without difficulties and is otherwise without complaint today.   Past Medical History  Diagnosis Date  . Diastolic heart failure   . Ovarian cancer   . Hypertension   . Atrial fibrillation   . Tachy-brady syndrome 2008    s/p PPM implantation by Dr Amil Amen  . Colon cancer   . Arthritis   . Anemia    Past Surgical History  Procedure Date  . Abdominal hysterectomy   . Oophorectomy   . Colonoscopy 03/30/2011    Procedure: COLONOSCOPY;  Surgeon: Barrie Folk, MD;  Location: [facility] ENDOSCOPY;  Service: Endoscopy;  Laterality: N/A;  . Esophagogastroduodenoscopy 03/30/2011    Procedure: ESOPHAGOGASTRODUODENOSCOPY (EGD);  Surgeon: Barrie Folk, MD;  Location: [facility] ENDOSCOPY;  Service: Endoscopy;  Laterality: N/A;  . Colon resection 04/03/2011    Procedure: COLON RESECTION LAPAROSCOPIC;  Surgeon: Atilano Ina, MD;  Location: [facility] OR;  Service: General;  Laterality: N/A;  Laparoscopic, turned open sigmoid colon resection, lysis of adhesions x 2.5 hours, rigid proctoscopy.  . Pacemaker placement 2008    MDT EnRhythm implanted by Dr Amil Amen    History   Social History  . Marital Status: Widowed    Spouse Name: N/A    Number of Children: N/A  . Years of Education: N/A   Occupational History  . Not on file.   Social History Main Topics  . Smoking status: Never Smoker   . Smokeless  tobacco: Never Used  . Alcohol Use: No  . Drug Use: No  . Sexually Active: No   Other Topics Concern  . Not on file   Social History Narrative  . No narrative on file    Family History  Problem Relation Age of Onset  . Anesthesia problems Neg Hx   . Hypotension Neg Hx   . Malignant hyperthermia Neg Hx   . Pseudochol deficiency Neg Hx   . Cancer Mother     ovarian    Allergies  Allergen Reactions  . Lasix (Furosemide) Swelling  . Penicillins Swelling    Mouth swelling and redness  . Sulfa Antibiotics Rash    Current Outpatient Prescriptions  Medication Sig Dispense Refill  . bumetanide (BUMEX) 1 MG tablet Take 1 tablet (1 mg total) by mouth daily.  30 tablet  0  . calcium-vitamin D (OSCAL WITH D) 500-200 MG-UNIT per tablet Take 1 tablet by mouth 2 (two) times daily.      Marland Kitchen denosumab (PROLIA) 60 MG/ML SOLN Inject 60 mg into the skin every 6 (six) months.       . diltiazem (CARDIZEM CD) 240 MG 24 hr capsule Take 240 mg by mouth daily.      . feeding supplement (ENSURE IMMUNE HEALTH) LIQD Take 237 mLs by mouth 3 (three) times daily with meals.       . feeding supplement (PRO-STAT SUGAR FREE 64) LIQD Take 30 mLs by mouth 2 (two) times daily.      Marland Kitchen  pantoprazole (PROTONIX) 40 MG tablet Take 40 mg by mouth daily.      . polysaccharide iron (NIFEREX) 150 MG CAPS capsule Take 150 mg by mouth every morning.       . potassium chloride SA (K-DUR,KLOR-CON) 20 MEQ tablet Take 1 tablet (20 mEq total) by mouth daily.  15 tablet  0  . ramipril (ALTACE) 10 MG capsule Take 10 mg by mouth daily.        . sotalol (BETAPACE) 160 MG tablet Take 160 mg by mouth 2 (two) times daily.      Marland Kitchen warfarin (COUMADIN) 3 MG tablet Take 3 mg by mouth daily.      Marland Kitchen DISCONTD: albuterol (PROVENTIL HFA;VENTOLIN HFA) 108 (90 BASE) MCG/ACT inhaler Inhale 2 puffs into the lungs every 6 (six) hours as needed for wheezing.  1 Inhaler  2  . albuterol (PROVENTIL HFA;VENTOLIN HFA) 108 (90 BASE) MCG/ACT inhaler Inhale 2  puffs into the lungs every 6 (six) hours as needed. For shortness of breath/wheezing      . DISCONTD: famotidine (PEPCID) 20 MG tablet Take 1 tablet (20 mg total) by mouth 2 (two) times daily.      Marland Kitchen DISCONTD: promethazine (PHENERGAN) 12.5 MG tablet Take 12.5 mg by mouth every 6 (six) hours as needed.        ROS- all systems are reviewed and negative except as per HPI  Physical Exam: Filed Vitals:   07/29/11 1613  BP: 104/64  Pulse: 65  Height: 5\' 8"  (1.727 m)  Weight: 175 lb 12.8 oz (79.742 kg)    GEN- The patient is elderly appearing, alert and oriented x 3 today.   Head- normocephalic, atraumatic Eyes-  Sclera clear, conjunctiva pink Ears- hearing intact Oropharynx- clear Neck- supple, no JVP Lymph- no cervical lymphadenopathy Lungs- Clear to ausculation bilaterally, normal work of breathing Chest- pacemaker pocket is well healed Heart- Regular rate and rhythm, no murmurs, rubs or gallops, PMI not laterally displaced GI- soft, NT, ND, + BS Extremities- no clubbing, cyanosis, or edema MS- no significant deformity or atrophy Skin- no rash or lesion Psych- euthymic mood, full affect Neuro- strength and sensation are intact  Pacemaker interrogation- reviewed in detail today,  See PACEART report  ekg today reveals atrial pacing, PR 240 msec, RBBB, LAHB  Assessment and Plan:

## 2011-07-30 NOTE — Assessment & Plan Note (Signed)
She requires frequent atrial pacing.  She has trifascular block also. Her pacemaker has reached ERI.  Risks, benefits, and alternatives to PPM pulse generator replacement were discussed at length with the patient who wishes to proceed.  We will plan ppm generator change at the next available time. She will hold coumadin for 2 days prior to generator change.

## 2011-07-30 NOTE — Assessment & Plan Note (Signed)
Presently in sinus rhythm Continue long term anticoagulation with coumadin.

## 2011-07-30 NOTE — Assessment & Plan Note (Signed)
Stable No change required today  

## 2011-08-03 ENCOUNTER — Telehealth: Payer: Self-pay | Admitting: Internal Medicine

## 2011-08-03 ENCOUNTER — Other Ambulatory Visit: Payer: Self-pay | Admitting: *Deleted

## 2011-08-03 DIAGNOSIS — I495 Sick sinus syndrome: Secondary | ICD-10-CM

## 2011-08-03 MED ORDER — VANCOMYCIN HCL IN DEXTROSE 1-5 GM/200ML-% IV SOLN
1000.0000 mg | INTRAVENOUS | Status: DC
  Filled 2011-08-03 (×2): qty 200

## 2011-08-03 MED ORDER — CHLORHEXIDINE GLUCONATE 4 % EX LIQD
60.0000 mL | Freq: Once | CUTANEOUS | Status: DC
  Filled 2011-08-03: qty 60

## 2011-08-03 MED ORDER — SODIUM CHLORIDE 0.9 % IR SOLN
80.0000 mg | Status: DC
  Filled 2011-08-03: qty 2

## 2011-08-03 NOTE — Telephone Encounter (Signed)
Okay to take your medications with water the morning of your procedure

## 2011-08-03 NOTE — Telephone Encounter (Signed)
Pt has a lot of meds 9 or 10 to take in the am, has procedure tomorrow , is she not to take them?

## 2011-08-04 ENCOUNTER — Encounter (HOSPITAL_COMMUNITY)
Admission: RE | Disposition: A | Discharge status: Home / Self Care | Payer: Self-pay | Source: Ambulatory Visit | Attending: Internal Medicine

## 2011-08-04 ENCOUNTER — Ambulatory Visit (HOSPITAL_COMMUNITY)
Admission: RE | Admit: 2011-08-04 | Discharge: 2011-08-04 | Disposition: A | Discharge status: Home / Self Care | Payer: Medicare Other | Source: Ambulatory Visit | Attending: Internal Medicine | Admitting: Internal Medicine

## 2011-08-04 DIAGNOSIS — M129 Arthropathy, unspecified: Secondary | ICD-10-CM | POA: Insufficient documentation

## 2011-08-04 DIAGNOSIS — D649 Anemia, unspecified: Secondary | ICD-10-CM | POA: Insufficient documentation

## 2011-08-04 DIAGNOSIS — Z85038 Personal history of other malignant neoplasm of large intestine: Secondary | ICD-10-CM | POA: Insufficient documentation

## 2011-08-04 DIAGNOSIS — I495 Sick sinus syndrome: Secondary | ICD-10-CM

## 2011-08-04 DIAGNOSIS — Z45018 Encounter for adjustment and management of other part of cardiac pacemaker: Secondary | ICD-10-CM | POA: Insufficient documentation

## 2011-08-04 DIAGNOSIS — Z950 Presence of cardiac pacemaker: Secondary | ICD-10-CM | POA: Diagnosis present

## 2011-08-04 DIAGNOSIS — I4891 Unspecified atrial fibrillation: Secondary | ICD-10-CM | POA: Diagnosis present

## 2011-08-04 DIAGNOSIS — I5030 Unspecified diastolic (congestive) heart failure: Secondary | ICD-10-CM | POA: Insufficient documentation

## 2011-08-04 DIAGNOSIS — Z8543 Personal history of malignant neoplasm of ovary: Secondary | ICD-10-CM | POA: Insufficient documentation

## 2011-08-04 DIAGNOSIS — I10 Essential (primary) hypertension: Secondary | ICD-10-CM | POA: Insufficient documentation

## 2011-08-04 HISTORY — PX: PACEMAKER GENERATOR CHANGE: SHX5481

## 2011-08-04 SURGERY — PACEMAKER GENERATOR CHANGE
Anesthesia: LOCAL

## 2011-08-04 MED ORDER — HEPARIN (PORCINE) IN NACL 2-0.9 UNIT/ML-% IJ SOLN
INTRAMUSCULAR | Status: AC
Start: 2011-08-04 — End: 2011-08-04
  Filled 2011-08-04: qty 1000

## 2011-08-04 MED ORDER — MUPIROCIN 2 % EX OINT
TOPICAL_OINTMENT | CUTANEOUS | Status: AC
  Filled 2011-08-04: qty 22

## 2011-08-04 MED ORDER — SODIUM CHLORIDE 0.45 % IV SOLN
INTRAVENOUS | Status: DC
  Administered 2011-08-04: 09:00:00 via INTRAVENOUS

## 2011-08-04 MED ORDER — MUPIROCIN 2 % EX OINT
TOPICAL_OINTMENT | Freq: Two times a day (BID) | CUTANEOUS | Status: DC
  Filled 2011-08-04: qty 22

## 2011-08-04 MED ORDER — LIDOCAINE HCL (PF) 1 % IJ SOLN
INTRAMUSCULAR | Status: AC
  Filled 2011-08-04: qty 60

## 2011-08-04 NOTE — H&P (View-Only) (Signed)
MARTIN,Rachel MARGARET, FNP, FNP: Primary Cardiologist:  Dr Varanasi  [name] is a 78 y.o. female with a h/o tachycardia/ bradycardia sp PPM (MDT) by Dr Edmunds in 2008 who presents today to establish care in the Electrophysiology device clinic.  The patient reports doing very well since having a pacemaker implanted and remains very active despite her age.   Today, she  denies symptoms of palpitations, chest pain, shortness of breath, orthopnea, PND, lower extremity edema, dizziness, presyncope, syncope, or neurologic sequela.  Her pacemaker has reached ERI battery status.  The patientis tolerating medications without difficulties and is otherwise without complaint today.   Past Medical History  Diagnosis Date  . Diastolic heart failure   . Ovarian cancer   . Hypertension   . Atrial fibrillation   . Tachy-brady syndrome 2008    s/p PPM implantation by Dr Edmunds  . Colon cancer   . Arthritis   . Anemia    Past Surgical History  Procedure Date  . Abdominal hysterectomy   . Oophorectomy   . Colonoscopy 03/30/2011    Procedure: COLONOSCOPY;  Surgeon: John C Hayes, MD;  Location: [facility] ENDOSCOPY;  Service: Endoscopy;  Laterality: N/A;  . Esophagogastroduodenoscopy 03/30/2011    Procedure: ESOPHAGOGASTRODUODENOSCOPY (EGD);  Surgeon: John C Hayes, MD;  Location: [facility] ENDOSCOPY;  Service: Endoscopy;  Laterality: N/A;  . Colon resection 04/03/2011    Procedure: COLON RESECTION LAPAROSCOPIC;  Surgeon: Eric M Wilson, MD;  Location: [facility] OR;  Service: General;  Laterality: N/A;  Laparoscopic, turned open sigmoid colon resection, lysis of adhesions x 2.5 hours, rigid proctoscopy.  . Pacemaker placement 2008    MDT EnRhythm implanted by Dr Edmunds    History   Social History  . Marital Status: Widowed    Spouse Name: N/A    Number of Children: N/A  . Years of Education: N/A   Occupational History  . Not on file.   Social History Main Topics  . Smoking status: Never Smoker   . Smokeless  tobacco: Never Used  . Alcohol Use: No  . Drug Use: No  . Sexually Active: No   Other Topics Concern  . Not on file   Social History Narrative  . No narrative on file    Family History  Problem Relation Age of Onset  . Anesthesia problems Neg Hx   . Hypotension Neg Hx   . Malignant hyperthermia Neg Hx   . Pseudochol deficiency Neg Hx   . Cancer Mother     ovarian    Allergies  Allergen Reactions  . Lasix (Furosemide) Swelling  . Penicillins Swelling    Mouth swelling and redness  . Sulfa Antibiotics Rash    Current Outpatient Prescriptions  Medication Sig Dispense Refill  . bumetanide (BUMEX) 1 MG tablet Take 1 tablet (1 mg total) by mouth daily.  30 tablet  0  . calcium-vitamin D (OSCAL WITH D) 500-200 MG-UNIT per tablet Take 1 tablet by mouth 2 (two) times daily.      . denosumab (PROLIA) 60 MG/ML SOLN Inject 60 mg into the skin every 6 (six) months.       . diltiazem (CARDIZEM CD) 240 MG 24 hr capsule Take 240 mg by mouth daily.      . feeding supplement (ENSURE IMMUNE HEALTH) LIQD Take 237 mLs by mouth 3 (three) times daily with meals.       . feeding supplement (PRO-STAT SUGAR FREE 64) LIQD Take 30 mLs by mouth 2 (two) times daily.      .   pantoprazole (PROTONIX) 40 MG tablet Take 40 mg by mouth daily.      . polysaccharide iron (NIFEREX) 150 MG CAPS capsule Take 150 mg by mouth every morning.       . potassium chloride SA (K-DUR,KLOR-CON) 20 MEQ tablet Take 1 tablet (20 mEq total) by mouth daily.  15 tablet  0  . ramipril (ALTACE) 10 MG capsule Take 10 mg by mouth daily.        . sotalol (BETAPACE) 160 MG tablet Take 160 mg by mouth 2 (two) times daily.      . warfarin (COUMADIN) 3 MG tablet Take 3 mg by mouth daily.      . DISCONTD: albuterol (PROVENTIL HFA;VENTOLIN HFA) 108 (90 BASE) MCG/ACT inhaler Inhale 2 puffs into the lungs every 6 (six) hours as needed for wheezing.  1 Inhaler  2  . albuterol (PROVENTIL HFA;VENTOLIN HFA) 108 (90 BASE) MCG/ACT inhaler Inhale 2  puffs into the lungs every 6 (six) hours as needed. For shortness of breath/wheezing      . DISCONTD: famotidine (PEPCID) 20 MG tablet Take 1 tablet (20 mg total) by mouth 2 (two) times daily.      . DISCONTD: promethazine (PHENERGAN) 12.5 MG tablet Take 12.5 mg by mouth every 6 (six) hours as needed.        ROS- all systems are reviewed and negative except as per HPI  Physical Exam: Filed Vitals:   07/29/11 1613  BP: 104/64  Pulse: 65  Height: 5' 8" (1.727 m)  Weight: 175 lb 12.8 oz (79.742 kg)    GEN- The patient is elderly appearing, alert and oriented x 3 today.   Head- normocephalic, atraumatic Eyes-  Sclera clear, conjunctiva pink Ears- hearing intact Oropharynx- clear Neck- supple, no JVP Lymph- no cervical lymphadenopathy Lungs- Clear to ausculation bilaterally, normal work of breathing Chest- pacemaker pocket is well healed Heart- Regular rate and rhythm, no murmurs, rubs or gallops, PMI not laterally displaced GI- soft, NT, ND, + BS Extremities- no clubbing, cyanosis, or edema MS- no significant deformity or atrophy Skin- no rash or lesion Psych- euthymic mood, full affect Neuro- strength and sensation are intact  Pacemaker interrogation- reviewed in detail today,  See PACEART report  ekg today reveals atrial pacing, PR 240 msec, RBBB, LAHB  Assessment and Plan:   

## 2011-08-04 NOTE — Discharge Instructions (Signed)
Pacemaker Battery Change A pacemaker battery usually lasts 4 to 12 years. Once or twice per year, you will be asked to visit your caregiver to have a full evaluation of your pacemaker. When a battery needs to be replaced, the entire pacemaker is actually replaced so that you can benefit from new circuitry and any new features that have recently been added to pacemakers. Most often, this procedure is very simple because the leads are already in place. After giving medicine to numb the skin, your health care provider makes a cut to reopen the pocket holding the pacemaker and disconnects the old device from its leads. The leads are routinely tested at this time. If they are working okay, the new pacemaker may simply be connected to the existing leads. If there is any problem with the old lead system, it may be wise to replace the lead system while inserting the new pacemaker. There are many things that affect how long a pacemaker battery will last:  Age of the pacemaker.   Number of leads (1, 2 or 3).   Pacemaker work load. If the pacemaker is helping the heart more often, then the battery will not last as long as if the pacemaker does not need to help the heart.   Resistance of the leads. The greater the resistance, the greater the drain on the battery. This can increase as the leads get older or if one or more of the leads does not have the best contact with the heart.   Power (voltage) settings.   The health of the person's heart. If the health of the heart gets worse, then the pacemaker may have to work more often and the setting changed to accommodate these changes.  Your health care provider will be alerted to the fact that it is time to replace the battery during follow-up exams. He or she will check your pacemaker using a small table-top computer, called a programmer, and a wand. The wand is about the same size as a remote control. Your provider puts the wand on your body in the area where the  pacemaker is located. Information from the pacemaker is received about how well your heart is working and the status of the battery. It is not painful, and it usually takes just a few minutes. You will have plenty of time before the battery is fully used up to plan for replacement.  LET YOUR CAREGIVER KNOW ABOUT:   Symptoms of chest pain, trouble breathing, palpitations, lightheadedness, or feelings of an abnormal or irregular heart beat.   Allergies.   Medications taken including herbs, eye drops, over the counter medications, and creams   Use of steroids (by mouth or creams).   Possible pregnancy, if applicable.   Previous problems with anesthetics or Novocaine.   History of blood clots (thrombophlebitis).   History of bleeding or blood problems.   Surgery since your last pacemaker placement.   Other health problems.  RISKS AND COMPLICATIONS These are very uncommon but include:  Bleeding.   Bruising of the skin around where the incision was made.   Pain at the site of the incision.   Pulling apart of the skin at the incision site.   Infection.   Allergic reaction to anesthetics or medicines used during the procedure.  Diabetics may have a temporary increase in their blood sugar after any surgical procedure.  BEFORE THE PROCEDURE  Wash all of the skin around the area of the chest where the pacemaker is located.   Try to remove any loose, scaling skin. Unless advised otherwise, avoid using aspirin, ibuprofen, or naproxen for 3-4 days before the procedure. Ask your caregiver for help with any other medication adjustments before the pacemaker is replaced. Unless advised otherwise, do not eat or drink after midnight on the night before the procedure EXCEPT for drinking water and taking your medications as you normally would. AFTER THE PROCEDURE   A heart monitor and the pacemaker programmer will be used to make sure that the new pacemaker is working properly.   You can go home  after the procedure.   Your caregiver will advise you if you need to have any stitches. They will be removed 5-7 days after the procedure.  HOME CARE INSTRUCTIONS   Keep the incision clean and dry.   Unless advised otherwise, you may shower after carefully covering the incision with plastic wrap that is taped to your chest.   For the first week after the replacement, avoid stretching motions that pull at the incision site and avoid heavy exercise with the arm on the same side as the incision.   Only take over-the-counter or prescription medicines for pain, discomfort, or fever as directed by your caregiver.   Your caregiver will tell you when you will need to next test your pacemaker by telephone or when to return to the office for re-exam and/or removal of stitches, if necessary.  SEEK MEDICAL CARE IF:   You have unusual pain at the incision site that is not adequately helped by over-the-counter or prescription medicine.   There is drainage or pus from the incision site.   You develop red streaking that extends above or below the incision site.   You feel brief intermittent palpitations, lightheadedness or any symptoms that you feel might be related to your heart.  SEEK IMMEDIATE MEDICAL CARE IF:   You experience chest pain that is different than the pain at the incision site.   You experience:   Shortness of breath.   Palpitations.   Irregular heart beat.   Lightheadedness that does not go away quickly.   Fainting.   You develop a fever.   You have pain that gets worse even though you are taking pain medicine.  MAKE SURE YOU:   Understand these instructions.   Will watch your condition.   Will get help right away if you are not doing well or get worse.  Document Released: 06/10/2006 Document Revised: 02/19/2011 Document Reviewed: 09/13/2006 [employer] Patient Information 2012 [employer]. 

## 2011-08-04 NOTE — Op Note (Signed)
SURGEON:  Hillis Range, MD     PREPROCEDURE DIAGNOSES:   1. Sick sinus syndrome.   2. Paroxsymal Atrial fibrillation.     POSTPROCEDURE DIAGNOSES:   1. Sick sinus syndrome.   2. Paroxsymal Atrial fibrillation.     PROCEDURES:   1. Pacemaker pulse generator replacement.   2. Skin pocket revision.     INTRODUCTION:  [name] is a 78 y.o. female with a history of sick sinus syndrome. He has done well since his pacemaker was implanted.  He has recently reached ERI battery status.  He presents today for pacemaker pulse generator replacement.       DESCRIPTION OF THE PROCEDURE:  Informed written consent was obtained, and the patient was brought to the electrophysiology lab in the fasting state.  The patient's pacemaker was interrogated today and found to be at elective replacement indicator battery status.  The patient was adequately sedated with intravenous Versed as outlined in the nursing report.  The patient's left chest was prepped and draped in the usual sterile fashion by the EP lab staff.  The skin overlying the existing pacemaker was infiltrated with lidocaine for local analgesia.  A 4-cm incision was made over the pacemaker pocket.  Using a combination of sharp and blunt dissection, the pacemaker was exposed and removed from the body.  The device was disconnected from the leads. A single silk suture was identified and removed which had secured the device to the pectoralis fascia.  There was no foreign matter or debris within the pocket.  The atrial lead was confirmed to be a Medtronic model [id number] (serial number [id number]) lead implanted on 05/28/2004.  The right ventricular lead was confirmed to be a Medtronic model [id number] (serial number [id number]) lead implanted on the same date as the atrial lead (above).  Both leads were examined and their integrity was confirmed to be intact.  Atrial lead P-waves measured 2.6 mV with impedance of 475 ohms and a threshold of 0.2 V at 0.5 msec.  Right  ventricular lead R-waves measured 9.5 mV with impedance of 570 ohms and a threshold of 0.7 V at 0.5 msec.  Both leads were connected to a Medtronic adapt L model ADDDR 1 (serial number [id number]) pacemaker.  The pocket was revised to accommodate this new device.  Electrocautery was required to assure hemostasis.  The pocket was irrigated with copious gentamicin solution. The pacemaker was then placed into the pocket.  The pocket was then closed in 2 layers with 2-0 Vicryl suture over the subcutaneous and subcuticular layers.  Steri-Strips and a sterile dressing were then applied.  There were no early apparent complications.     CONCLUSIONS:   1. Successful pacemaker pulse generator replacement for elective replacement indicator battery status   2. No early apparent complications.     Hillis Range, MD 08/04/2011 12:08 PM

## 2011-08-04 NOTE — Interval H&P Note (Signed)
History and Physical Interval Note:  08/04/2011 10:57 AM  [name]  has presented today for surgery, with the diagnosis of End of life  The various methods of treatment have been discussed with the patient and family. After consideration of risks, benefits and other options for treatment, the patient has consented to  Procedure(s) (LRB): PACEMAKER GENERATOR CHANGE (N/A) as a surgical intervention .  The patients' history has been reviewed, patient examined, no change in status, stable for surgery.  I have reviewed the patients' chart and labs.  Questions were answered to the patient's satisfaction.     Hillis Range

## 2011-08-07 ENCOUNTER — Encounter: Payer: Self-pay | Admitting: *Deleted

## 2011-08-13 ENCOUNTER — Telehealth: Payer: Self-pay | Admitting: Internal Medicine

## 2011-08-13 NOTE — Telephone Encounter (Signed)
Please return call to patient regarding pacer implant f/u questions, she can be reached at [phone].

## 2011-08-13 NOTE — Telephone Encounter (Signed)
Spoke with patient and let her know I would have scheduling call her to arrange a wound check and a follow up appointment in 3 months with Dr Johney Frame in [place].  She still had the large bandage over her site and I have asked her to remove and let her know to leave the steri strips on.  She can shower now [name] is going to arrange follow up and call

## 2011-08-20 ENCOUNTER — Encounter: Payer: Self-pay | Admitting: Internal Medicine

## 2011-08-20 ENCOUNTER — Ambulatory Visit (INDEPENDENT_AMBULATORY_CARE_PROVIDER_SITE_OTHER): Payer: Medicare Other | Admitting: *Deleted

## 2011-08-20 ENCOUNTER — Ambulatory Visit: Payer: Medicare Other

## 2011-08-20 DIAGNOSIS — I495 Sick sinus syndrome: Secondary | ICD-10-CM

## 2011-08-20 LAB — PACEMAKER DEVICE OBSERVATION
BATTERY VOLTAGE: 2.8 V
RV LEAD AMPLITUDE: 11.2 mv
VENTRICULAR PACING PM: 0

## 2011-08-20 NOTE — Progress Notes (Signed)
Wound check-PPM 

## 2011-09-28 ENCOUNTER — Other Ambulatory Visit: Payer: Self-pay | Admitting: *Deleted

## 2011-09-28 MED ORDER — SOTALOL HCL 160 MG PO TABS: 160.0000 mg | ORAL_TABLET | Freq: Two times a day (BID) | ORAL | Status: DC

## 2011-11-04 ENCOUNTER — Encounter: Payer: Self-pay | Admitting: Internal Medicine

## 2011-11-04 ENCOUNTER — Ambulatory Visit (INDEPENDENT_AMBULATORY_CARE_PROVIDER_SITE_OTHER): Payer: Medicare Other | Admitting: Internal Medicine

## 2011-11-04 VITALS — BP 102/65 | HR 65 | Ht 68.0 in | Wt 178.0 lb

## 2011-11-04 DIAGNOSIS — I4892 Unspecified atrial flutter: Secondary | ICD-10-CM

## 2011-11-04 DIAGNOSIS — I495 Sick sinus syndrome: Secondary | ICD-10-CM

## 2011-11-04 DIAGNOSIS — I4891 Unspecified atrial fibrillation: Secondary | ICD-10-CM

## 2011-11-04 LAB — PACEMAKER DEVICE OBSERVATION
AL THRESHOLD: 0.625 V
BAMS-0001: 155 {beats}/min
RV LEAD THRESHOLD: 1 V

## 2011-11-04 NOTE — Assessment & Plan Note (Signed)
Doing well s/p generator change  Normal pacemaker function See Pace Art report No changes today  She wishes to have her device followed in our [facility] device clinic which is much closer to her that Dr Hoyle Barr office in [place]. I have informed her that I am happy to follow her pacemaker but have encouraged her to continue to follow with Dr Eldridge Dace for her routine cardiology care.

## 2011-11-04 NOTE — Assessment & Plan Note (Signed)
Stable No changes today  Continue coumadin long term.

## 2011-11-04 NOTE — Progress Notes (Signed)
Bennie Pierini, FNP: Primary Cardiologist:  Dr Louretta Shorten [name] is a 78 y.o. female with a h/o tachycardia/ bradycardia sp PPM (MDT) by Dr Amil Amen in 2008 who presents today for follow-up in the Electrophysiology device clinic.  The patient reports doing very well since having a pacemaker implanted and remains very active despite her age.  Her generator change 5/13 was uneventful.   Today, she  denies symptoms of palpitations, chest pain, shortness of breath, orthopnea, PND, lower extremity edema, dizziness, presyncope, syncope, or neurologic sequela. The patientis tolerating medications without difficulties and is otherwise without complaint today.   Past Medical History  Diagnosis Date  . Diastolic heart failure   . Ovarian cancer   . Hypertension   . Atrial fibrillation   . Tachy-brady syndrome 2008    s/p PPM implantation by Dr Amil Amen  . Colon cancer   . Arthritis   . Anemia    Past Surgical History  Procedure Date  . Abdominal hysterectomy   . Oophorectomy   . Colonoscopy 03/30/2011    Procedure: COLONOSCOPY;  Surgeon: Barrie Folk, MD;  Location: [facility] ENDOSCOPY;  Service: Endoscopy;  Laterality: N/A;  . Esophagogastroduodenoscopy 03/30/2011    Procedure: ESOPHAGOGASTRODUODENOSCOPY (EGD);  Surgeon: Barrie Folk, MD;  Location: [facility] ENDOSCOPY;  Service: Endoscopy;  Laterality: N/A;  . Colon resection 04/03/2011    Procedure: COLON RESECTION LAPAROSCOPIC;  Surgeon: Atilano Ina, MD;  Location: [facility] OR;  Service: General;  Laterality: N/A;  Laparoscopic, turned open sigmoid colon resection, lysis of adhesions x 2.5 hours, rigid proctoscopy.  . Pacemaker placement 2008    MDT EnRhythm implanted by Dr Amil Amen, generator change (MDT Adapta L) by Dr Johney Frame 08/04/11    History   Social History  . Marital Status: Widowed    Spouse Name: N/A    Number of Children: N/A  . Years of Education: N/A   Occupational History  . Not on file.   Social History Main Topics  . Smoking  status: Never Smoker   . Smokeless tobacco: Never Used  . Alcohol Use: No  . Drug Use: No  . Sexually Active: No   Other Topics Concern  . Not on file   Social History Narrative  . No narrative on file    Family History  Problem Relation Age of Onset  . Anesthesia problems Neg Hx   . Hypotension Neg Hx   . Malignant hyperthermia Neg Hx   . Pseudochol deficiency Neg Hx   . Cancer Mother     ovarian    Allergies  Allergen Reactions  . Lasix (Furosemide) Swelling  . Penicillins Swelling    Mouth swelling and redness  . Sulfa Antibiotics Rash    Current Outpatient Prescriptions  Medication Sig Dispense Refill  . albuterol (PROVENTIL HFA;VENTOLIN HFA) 108 (90 BASE) MCG/ACT inhaler Inhale 2 puffs into the lungs every 6 (six) hours as needed. For shortness of breath/wheezing      . bumetanide (BUMEX) 1 MG tablet Take 1 tablet (1 mg total) by mouth daily.  30 tablet  0  . calcium-vitamin D (OSCAL WITH D) 500-200 MG-UNIT per tablet Take 1 tablet by mouth 2 (two) times daily.      Marland Kitchen denosumab (PROLIA) 60 MG/ML SOLN Inject 60 mg into the skin every 6 (six) months.       . diltiazem (CARDIZEM CD) 240 MG 24 hr capsule Take 240 mg by mouth daily.      . feeding supplement (ENSURE IMMUNE HEALTH)  LIQD Take 237 mLs by mouth 3 (three) times daily with meals.       . feeding supplement (PRO-STAT SUGAR FREE 64) LIQD Take 30 mLs by mouth 2 (two) times daily.      . pantoprazole (PROTONIX) 40 MG tablet Take 40 mg by mouth daily.      . polysaccharide iron (NIFEREX) 150 MG CAPS capsule Take 150 mg by mouth every morning.       . potassium chloride SA (K-DUR,KLOR-CON) 20 MEQ tablet Take 1 tablet (20 mEq total) by mouth daily.  15 tablet  0  . ramipril (ALTACE) 10 MG capsule Take 10 mg by mouth daily.        . sotalol (BETAPACE) 160 MG tablet Take 1 tablet (160 mg total) by mouth 2 (two) times daily.  60 tablet  6  . warfarin (COUMADIN) 3 MG tablet Take 3 mg by mouth daily.      Marland Kitchen DISCONTD:  famotidine (PEPCID) 20 MG tablet Take 1 tablet (20 mg total) by mouth 2 (two) times daily.      Marland Kitchen DISCONTD: promethazine (PHENERGAN) 12.5 MG tablet Take 12.5 mg by mouth every 6 (six) hours as needed.        Physical Exam: Filed Vitals:   11/04/11 1059  BP: 102/65  Pulse: 65  Height: 5\' 8"  (1.727 m)  Weight: 178 lb (80.74 kg)    GEN- The patient is elderly appearing, alert and oriented x 3 today.   Head- normocephalic, atraumatic Eyes-  Sclera clear, conjunctiva pink Ears- hearing intact Oropharynx- clear Neck- supple, no JVP Lymph- no cervical lymphadenopathy Lungs- Clear to ausculation bilaterally, normal work of breathing Chest- pacemaker pocket is well healed Heart- Regular rate and rhythm, no murmurs, rubs or gallops, PMI not laterally displaced GI- soft, NT, ND, + BS Extremities- no clubbing, cyanosis, or edema MS- no significant deformity or atrophy  Pacemaker interrogation- reviewed in detail today,  See PACEART report  Assessment and Plan:

## 2011-11-04 NOTE — Patient Instructions (Addendum)
Your physician recommends that you schedule a follow-up appointment in: May 2014 with [facility]. You will receive a reminder letter in the mail in about 6 months reminding you to call and schedule your appointment. If you don't receive this letter, please contact our office. Your physician recommends that you continue on your current medications as directed. Please refer to the Current Medication list given to you today.

## 2011-12-22 ENCOUNTER — Inpatient Hospital Stay (HOSPITAL_COMMUNITY)
Admission: EM | Admit: 2011-12-22 | Discharge: 2011-12-24 | DRG: 812 | Disposition: A | Discharge status: Home / Self Care | Payer: Medicare Other | Attending: Internal Medicine | Admitting: Internal Medicine

## 2011-12-22 ENCOUNTER — Encounter (HOSPITAL_COMMUNITY): Payer: Self-pay

## 2011-12-22 DIAGNOSIS — K639 Disease of intestine, unspecified: Secondary | ICD-10-CM | POA: Diagnosis present

## 2011-12-22 DIAGNOSIS — I4892 Unspecified atrial flutter: Secondary | ICD-10-CM

## 2011-12-22 DIAGNOSIS — D509 Iron deficiency anemia, unspecified: Principal | ICD-10-CM | POA: Diagnosis present

## 2011-12-22 DIAGNOSIS — Z79899 Other long term (current) drug therapy: Secondary | ICD-10-CM

## 2011-12-22 DIAGNOSIS — Z8543 Personal history of malignant neoplasm of ovary: Secondary | ICD-10-CM

## 2011-12-22 DIAGNOSIS — I509 Heart failure, unspecified: Secondary | ICD-10-CM | POA: Diagnosis present

## 2011-12-22 DIAGNOSIS — Z6829 Body mass index (BMI) 29.0-29.9, adult: Secondary | ICD-10-CM

## 2011-12-22 DIAGNOSIS — M1990 Unspecified osteoarthritis, unspecified site: Secondary | ICD-10-CM | POA: Diagnosis present

## 2011-12-22 DIAGNOSIS — R609 Edema, unspecified: Secondary | ICD-10-CM

## 2011-12-22 DIAGNOSIS — I10 Essential (primary) hypertension: Secondary | ICD-10-CM | POA: Diagnosis present

## 2011-12-22 DIAGNOSIS — C569 Malignant neoplasm of unspecified ovary: Secondary | ICD-10-CM

## 2011-12-22 DIAGNOSIS — Z880 Allergy status to penicillin: Secondary | ICD-10-CM

## 2011-12-22 DIAGNOSIS — Z91040 Latex allergy status: Secondary | ICD-10-CM

## 2011-12-22 DIAGNOSIS — Z7901 Long term (current) use of anticoagulants: Secondary | ICD-10-CM

## 2011-12-22 DIAGNOSIS — K589 Irritable bowel syndrome without diarrhea: Secondary | ICD-10-CM

## 2011-12-22 DIAGNOSIS — K5900 Constipation, unspecified: Secondary | ICD-10-CM | POA: Diagnosis present

## 2011-12-22 DIAGNOSIS — Z9049 Acquired absence of other specified parts of digestive tract: Secondary | ICD-10-CM

## 2011-12-22 DIAGNOSIS — K5730 Diverticulosis of large intestine without perforation or abscess without bleeding: Secondary | ICD-10-CM | POA: Diagnosis present

## 2011-12-22 DIAGNOSIS — I4891 Unspecified atrial fibrillation: Secondary | ICD-10-CM | POA: Diagnosis present

## 2011-12-22 DIAGNOSIS — Z950 Presence of cardiac pacemaker: Secondary | ICD-10-CM | POA: Diagnosis present

## 2011-12-22 DIAGNOSIS — I5030 Unspecified diastolic (congestive) heart failure: Secondary | ICD-10-CM | POA: Diagnosis present

## 2011-12-22 DIAGNOSIS — I495 Sick sinus syndrome: Secondary | ICD-10-CM | POA: Diagnosis present

## 2011-12-22 DIAGNOSIS — D649 Anemia, unspecified: Secondary | ICD-10-CM | POA: Diagnosis present

## 2011-12-22 DIAGNOSIS — Z90710 Acquired absence of both cervix and uterus: Secondary | ICD-10-CM

## 2011-12-22 DIAGNOSIS — E669 Obesity, unspecified: Secondary | ICD-10-CM | POA: Diagnosis present

## 2011-12-22 DIAGNOSIS — Z9079 Acquired absence of other genital organ(s): Secondary | ICD-10-CM

## 2011-12-22 DIAGNOSIS — K6389 Other specified diseases of intestine: Secondary | ICD-10-CM

## 2011-12-22 DIAGNOSIS — Z882 Allergy status to sulfonamides status: Secondary | ICD-10-CM

## 2011-12-22 DIAGNOSIS — C187 Malignant neoplasm of sigmoid colon: Secondary | ICD-10-CM

## 2011-12-22 LAB — PROTIME-INR
INR: 2.2 — ABNORMAL HIGH (ref 0.00–1.49)
Prothrombin Time: 23.5 seconds — ABNORMAL HIGH (ref 11.6–15.2)

## 2011-12-22 LAB — URINALYSIS, ROUTINE W REFLEX MICROSCOPIC
Glucose, UA: NEGATIVE mg/dL
Leukocytes, UA: NEGATIVE
Nitrite: NEGATIVE
Protein, ur: NEGATIVE mg/dL

## 2011-12-22 LAB — CBC WITH DIFFERENTIAL/PLATELET
Basophils Absolute: 0 10*3/uL (ref 0.0–0.1)
Eosinophils Relative: 1 % (ref 0–5)
Lymphocytes Relative: 21 % (ref 12–46)
Neutro Abs: 4 10*3/uL (ref 1.7–7.7)
Platelets: 320 10*3/uL (ref 150–400)
RDW: 16.5 % — ABNORMAL HIGH (ref 11.5–15.5)
WBC: 5.7 10*3/uL (ref 4.0–10.5)

## 2011-12-22 LAB — RETICULOCYTES
RBC.: 3.5 MIL/uL — ABNORMAL LOW (ref 3.87–5.11)
Retic Ct Pct: 1.7 % (ref 0.4–3.1)

## 2011-12-22 LAB — BASIC METABOLIC PANEL
CO2: 28 mEq/L (ref 19–32)
Calcium: 9.4 mg/dL (ref 8.4–10.5)
Chloride: 103 mEq/L (ref 96–112)
Sodium: 140 mEq/L (ref 135–145)

## 2011-12-22 MED ORDER — DILTIAZEM HCL ER COATED BEADS 240 MG PO CP24
240.0000 mg | ORAL_CAPSULE | Freq: Every day | ORAL | Status: DC
  Administered 2011-12-23 – 2011-12-24 (×2): 240 mg via ORAL
  Filled 2011-12-22 (×2): qty 1

## 2011-12-22 MED ORDER — MAGNESIUM HYDROXIDE 400 MG/5ML PO SUSP
15.0000 mL | Freq: Every day | ORAL | Status: DC
  Administered 2011-12-22 – 2011-12-23 (×2): 15 mL via ORAL
  Filled 2011-12-22 (×2): qty 30

## 2011-12-22 MED ORDER — RAMIPRIL 10 MG PO CAPS
10.0000 mg | ORAL_CAPSULE | Freq: Every day | ORAL | Status: DC
  Administered 2011-12-23 – 2011-12-24 (×2): 10 mg via ORAL
  Filled 2011-12-22 (×2): qty 1

## 2011-12-22 MED ORDER — SOTALOL HCL 80 MG PO TABS
160.0000 mg | ORAL_TABLET | Freq: Two times a day (BID) | ORAL | Status: DC
  Administered 2011-12-22 – 2011-12-24 (×4): 160 mg via ORAL
  Filled 2011-12-22 (×6): qty 2

## 2011-12-22 MED ORDER — POLYSACCHARIDE IRON COMPLEX 150 MG PO CAPS
150.0000 mg | ORAL_CAPSULE | ORAL | Status: DC
  Administered 2011-12-23 – 2011-12-24 (×2): 150 mg via ORAL
  Filled 2011-12-22 (×2): qty 1

## 2011-12-22 MED ORDER — PANTOPRAZOLE SODIUM 40 MG PO TBEC
40.0000 mg | DELAYED_RELEASE_TABLET | Freq: Every day | ORAL | Status: DC
  Administered 2011-12-23 – 2011-12-24 (×2): 40 mg via ORAL
  Filled 2011-12-22 (×2): qty 1

## 2011-12-22 MED ORDER — CALCIUM CARBONATE-VITAMIN D 500-200 MG-UNIT PO TABS
1.0000 | ORAL_TABLET | Freq: Two times a day (BID) | ORAL | Status: DC
  Administered 2011-12-22 – 2011-12-24 (×4): 1 via ORAL
  Filled 2011-12-22 (×4): qty 1

## 2011-12-22 MED ORDER — WARFARIN - PHYSICIAN DOSING INPATIENT: Freq: Every day | Status: DC

## 2011-12-22 MED ORDER — ENSURE COMPLETE PO LIQD
237.0000 mL | Freq: Every day | ORAL | Status: DC
  Administered 2011-12-23 – 2011-12-24 (×2): 237 mL via ORAL

## 2011-12-22 MED ORDER — WARFARIN SODIUM 3 MG PO TABS: 3.0000 mg | ORAL_TABLET | Freq: Every day | ORAL | Status: DC

## 2011-12-22 MED ORDER — SODIUM CHLORIDE 0.9 % IJ SOLN
3.0000 mL | Freq: Two times a day (BID) | INTRAMUSCULAR | Status: DC
  Administered 2011-12-22 – 2011-12-24 (×4): 3 mL via INTRAVENOUS
  Filled 2011-12-22 (×4): qty 3

## 2011-12-22 MED ORDER — POTASSIUM CHLORIDE CRYS ER 20 MEQ PO TBCR
20.0000 meq | EXTENDED_RELEASE_TABLET | Freq: Every day | ORAL | Status: DC
  Administered 2011-12-23 – 2011-12-24 (×2): 20 meq via ORAL
  Filled 2011-12-22 (×3): qty 1

## 2011-12-22 MED ORDER — ONDANSETRON HCL 4 MG PO TABS: 4.0000 mg | ORAL_TABLET | Freq: Four times a day (QID) | ORAL | Status: DC | PRN

## 2011-12-22 MED ORDER — ALBUTEROL SULFATE HFA 108 (90 BASE) MCG/ACT IN AERS: 2.0000 | INHALATION_SPRAY | Freq: Four times a day (QID) | RESPIRATORY_TRACT | Status: DC | PRN

## 2011-12-22 MED ORDER — BUMETANIDE 1 MG PO TABS
1.0000 mg | ORAL_TABLET | Freq: Every day | ORAL | Status: DC
  Administered 2011-12-23 – 2011-12-24 (×2): 1 mg via ORAL
  Filled 2011-12-22 (×2): qty 1

## 2011-12-22 MED ORDER — ONDANSETRON HCL 4 MG/2ML IJ SOLN: 4.0000 mg | Freq: Four times a day (QID) | INTRAMUSCULAR | Status: DC | PRN

## 2011-12-22 NOTE — ED Notes (Signed)
Pt reports went to PCP this morning for routine blood work and was told her hemoglobin was 6.8.   Reports pt has history of anemia.  Denies seeing dark or bloody stools.  Denies any dizziness, SOB, or fatigue.

## 2011-12-22 NOTE — ED Provider Notes (Cosign Needed)
History  This chart was scribed for Ward Givens, MD by Ladona Ridgel Day. This patient was seen in room APA16A/APA16A and the patient's care was started at 1038.  CSN: [id number]  Arrival date & time 12/22/11  1038   First MD Initiated Contact with Patient 12/22/11 1100     Chief Complaint  Patient presents with  . hemoglobin low    Patient is a 78 y.o. female presenting with shortness of breath. The history is provided by the patient. No language interpreter was used.  Shortness of Breath  Episode onset: No SOB but pt reports with low hbg and hx of anemia. Onset quality: chronic anemia. The problem occurs continuously. The problem has been unchanged. The problem is moderate. Nothing relieves the symptoms. Nothing aggravates the symptoms. Pertinent negatives include no chest pain, no fever and no shortness of breath. Recently, medical care has been given by the PCP. Services received include tests performed.   [name] is a 78 y.o. female who presents to the Emergency Department with hx of anemia after reportedly low hemoglobin of 6.8 from PCP for regular blood work. She reports she went to her doctor's office today for her regularly scheduled appointment . She states overall she feels fine but was told to come here for her low hemoglobin. She denies any light headedness/dizziness, bloody stools, SOB, CP. She has chronic anemia all her life and has taken iron supplements. She had colon CA and surgery 4 months ago.   Dr Christell Constant  in [facility] Cardiology Dr Tillie Rung GI Dr Corinda Gubler  Past Medical History  Diagnosis Date  . Diastolic heart failure   . Ovarian cancer   . Hypertension   . Atrial fibrillation   . Tachy-brady syndrome 2008    s/p PPM implantation by Dr Amil Amen  . Colon cancer   . Arthritis   . Anemia     Past Surgical History  Procedure Date  . Abdominal hysterectomy   . Oophorectomy   . Colonoscopy 03/30/2011    Procedure: COLONOSCOPY;  Surgeon: Barrie Folk, MD;  Location: [facility]  ENDOSCOPY;  Service: Endoscopy;  Laterality: N/A;  . Esophagogastroduodenoscopy 03/30/2011    Procedure: ESOPHAGOGASTRODUODENOSCOPY (EGD);  Surgeon: Barrie Folk, MD;  Location: [facility] ENDOSCOPY;  Service: Endoscopy;  Laterality: N/A;  . Colon resection 04/03/2011    Procedure: COLON RESECTION LAPAROSCOPIC;  Surgeon: Atilano Ina, MD;  Location: [facility] OR;  Service: General;  Laterality: N/A;  Laparoscopic, turned open sigmoid colon resection, lysis of adhesions x 2.5 hours, rigid proctoscopy.  . Pacemaker placement 2008    MDT EnRhythm implanted by Dr Amil Amen, generator change (MDT Adapta L) by Dr Johney Frame 08/04/11    Family History  Problem Relation Age of Onset  . Anesthesia problems Neg Hx   . Hypotension Neg Hx   . Malignant hyperthermia Neg Hx   . Pseudochol deficiency Neg Hx   . Cancer Mother     ovarian    History  Substance Use Topics  . Smoking status: Never Smoker   . Smokeless tobacco: Never Used  . Alcohol Use: No  Lives alone Lives at home  OB History    Grav Para Term Preterm Abortions TAB SAB Ect Mult Living                  Review of Systems  Constitutional: Negative for fever and chills.       Hbg of 6.8 per PCP, pt denies SOB  Respiratory: Negative for shortness of breath.  Cardiovascular: Negative for chest pain.  Gastrointestinal: Negative for nausea, vomiting and abdominal pain.  Neurological: Negative for dizziness, weakness and light-headedness.  All other systems reviewed and are negative.    Allergies  Lasix; Penicillins; and Sulfa antibiotics  Home Medications   Current Outpatient Rx  Name Route Sig Dispense Refill  . BUMETANIDE 1 MG PO TABS Oral Take 1 tablet (1 mg total) by mouth daily. 30 tablet 0  . CALCIUM CARBONATE-VITAMIN D 500-200 MG-UNIT PO TABS Oral Take 1 tablet by mouth 2 (two) times daily.    Marland Kitchen DILTIAZEM HCL ER COATED BEADS 240 MG PO CP24 Oral Take 240 mg by mouth daily.    Marland Kitchen ENSURE IMMUNE HEALTH PO LIQD Oral Take 237 mLs by mouth  daily.     Marland Kitchen MAGNESIUM HYDROXIDE 400 MG/5ML PO SUSP Oral Take 15 mLs by mouth at bedtime.    Marland Kitchen PANTOPRAZOLE SODIUM 40 MG PO TBEC Oral Take 40 mg by mouth daily.    Marland Kitchen POLYSACCHARIDE IRON 150 MG PO CAPS Oral Take 150 mg by mouth every morning.     Marland Kitchen POTASSIUM CHLORIDE CRYS ER 20 MEQ PO TBCR Oral Take 1 tablet (20 mEq total) by mouth daily. 15 tablet 0  . RAMIPRIL 10 MG PO CAPS Oral Take 10 mg by mouth daily.      Marland Kitchen SOTALOL HCL 160 MG PO TABS Oral Take 1 tablet (160 mg total) by mouth 2 (two) times daily. 60 tablet 6  . WARFARIN SODIUM 3 MG PO TABS Oral Take 3-6 mg by mouth daily. On Mon, Wed, Frid takes 5 mg, on Tues, Thur, Sat, Sun 10 mg.    . ALBUTEROL SULFATE HFA 108 (90 BASE) MCG/ACT IN AERS Inhalation Inhale 2 puffs into the lungs every 6 (six) hours as needed. For shortness of breath/wheezing    . DENOSUMAB 60 MG/ML [place] SOLN Subcutaneous Inject 60 mg into the skin every 6 (six) months.       Triage Vitals: BP 119/70  Pulse 73  Temp 97.9 F (36.6 C) (Oral)  Resp 18  Ht 5\' 7"  (1.702 m)  Wt 178 lb (80.74 kg)  BMI 27.88 kg/m2  SpO2 100%  Vital signs normal    Physical Exam  Nursing note and vitals reviewed. Constitutional: She is oriented to person, place, and time. She appears well-developed and well-nourished.  Non-toxic appearance. She does not appear ill. No distress.  HENT:  Head: Normocephalic and atraumatic.  Right Ear: External ear normal.  Left Ear: External ear normal.  Nose: Nose normal. No mucosal edema or rhinorrhea.  Mouth/Throat: Oropharynx is clear and moist and mucous membranes are normal. No dental abscesses or uvula swelling.  Eyes: EOM are normal. Pupils are equal, round, and reactive to light.       conjunctiva are pale  Neck: Normal range of motion and full passive range of motion without pain. Neck supple.  Cardiovascular: Normal rate, regular rhythm and normal heart sounds.  Exam reveals no gallop and no friction rub.   No murmur heard. Pulmonary/Chest:  Effort normal and breath sounds normal. No respiratory distress. She has no wheezes. She has no rhonchi. She has no rales. She exhibits no tenderness and no crepitus.  Abdominal: Soft. Normal appearance and bowel sounds are normal. She exhibits no distension. There is no tenderness. There is no rebound and no guarding.       Active bowel sounds  Genitourinary:       Tags of old hemorrhoids present. No stool in  vault, small amount brown stool.   Musculoskeletal: Normal range of motion. She exhibits no edema and no tenderness.       Moves all extremities well.   Neurological: She is alert and oriented to person, place, and time. She has normal strength. No cranial nerve deficit.  Skin: Skin is warm, dry and intact. No rash noted. No erythema. No pallor.  Psychiatric: She has a normal mood and affect. Her speech is normal and behavior is normal. Her mood appears not anxious.    ED Course  Procedures (including critical care time) DIAGNOSTIC STUDIES: Oxygen Saturation is 100% on room air, normal by my interpretation.    COORDINATION OF CARE: At 1215 PM Discussed treatment plan   Pt prepared for transfusion  14:50 Dr Karilyn Cota will come admit patient.   Results for orders placed during the hospital encounter of 12/22/11  CBC WITH DIFFERENTIAL      Component Value Range   WBC 5.7  4.0 - 10.5 K/uL   RBC 3.48 (*) 3.87 - 5.11 MIL/uL   Hemoglobin 7.1 (*) 12.0 - 15.0 g/dL   HCT 95.2 (*) 84.1 - 32.4 %   MCV 72.4 (*) 78.0 - 100.0 fL   MCH 20.4 (*) 26.0 - 34.0 pg   MCHC 28.2 (*) 30.0 - 36.0 g/dL   RDW 40.1 (*) 02.7 - 25.3 %   Platelets 320  150 - 400 K/uL   Neutrophils Relative 70  43 - 77 %   Neutro Abs 4.0  1.7 - 7.7 K/uL   Lymphocytes Relative 21  12 - 46 %   Lymphs Abs 1.2  0.7 - 4.0 K/uL   Monocytes Relative 8  3 - 12 %   Monocytes Absolute 0.5  0.1 - 1.0 K/uL   Eosinophils Relative 1  0 - 5 %   Eosinophils Absolute 0.1  0.0 - 0.7 K/uL   Basophils Relative 0  0 - 1 %   Basophils  Absolute 0.0  0.0 - 0.1 K/uL  BASIC METABOLIC PANEL      Component Value Range   Sodium 140  135 - 145 mEq/L   Potassium 3.9  3.5 - 5.1 mEq/L   Chloride 103  96 - 112 mEq/L   CO2 28  19 - 32 mEq/L   Glucose, Bld 104 (*) 70 - 99 mg/dL   BUN 29 (*) 6 - 23 mg/dL   Creatinine, Ser 6.64  0.50 - 1.10 mg/dL   Calcium 9.4  8.4 - 40.3 mg/dL   GFR calc non Af Amer 49 (*) >90 mL/min   GFR calc Af Amer 56 (*) >90 mL/min  URINALYSIS, ROUTINE W REFLEX MICROSCOPIC      Component Value Range   Color, Urine YELLOW  YELLOW   APPearance CLEAR  CLEAR   Specific Gravity, Urine 1.010  1.005 - 1.030   pH 6.5  5.0 - 8.0   Glucose, UA NEGATIVE  NEGATIVE mg/dL   Hgb urine dipstick NEGATIVE  NEGATIVE   Bilirubin Urine NEGATIVE  NEGATIVE   Ketones, ur NEGATIVE  NEGATIVE mg/dL   Protein, ur NEGATIVE  NEGATIVE mg/dL   Urobilinogen, UA 0.2  0.0 - 1.0 mg/dL   Nitrite NEGATIVE  NEGATIVE   Leukocytes, UA NEGATIVE  NEGATIVE  TYPE AND SCREEN      Component Value Range   ABO/RH(D) O POS     Antibody Screen NEG     Sample Expiration 12/25/2011     Unit Number [id number]     Blood  Component Type RED CELLS,LR     Unit division 00     Status of Unit ALLOCATED     Transfusion Status OK TO TRANSFUSE     Crossmatch Result Compatible     Unit Number [id number]     Blood Component Type RED CELLS,LR     Unit division 00     Status of Unit ISSUED     Transfusion Status OK TO TRANSFUSE     Crossmatch Result Compatible    PREPARE RBC (CROSSMATCH)      Component Value Range   Order Confirmation ORDER PROCESSED BY BLOOD BANK    ABO/RH      Component Value Range   ABO/RH(D) O POS    APTT      Component Value Range   aPTT 31  24 - 37 seconds  PROTIME-INR      Component Value Range   Prothrombin Time 23.5 (*) 11.6 - 15.2 seconds   INR 2.20 (*) 0.00 - 1.49   Hemoccult +  Laboratory interpretation all normal except anemia, therapeutic INR     Date: 12/22/2011  Rate: 60  Rhythm: atrial paced rhythm   QRS Axis: normal  Intervals: normal  ST/T Wave abnormalities: normal  Conduction Disutrbances:right bundle branch block  Narrative Interpretation:   Old EKG Reviewed: unchanged from 06/09/2011   1. Anemia     Plan admission  Devoria Albe, MD, FACEP    MDM  I personally performed the services described in this documentation, which was scribed in my presence. The recorded information has been reviewed and considered.  Devoria Albe, MD, Armando Gang          Ward Givens, MD 12/22/11 [phone]

## 2011-12-22 NOTE — H&P (Signed)
[facility] History and Physical  [name]:[id number] DOB: 1923-02-10 DOA: 12/22/2011  Referring physician: Dr. Lynelle Doctor, ER physician. PCP: Bennie Pierini, FNP    Chief Complaint: Weakness.  HPI: [name] is a 78 y.o. female has been complaining of the above symptoms for the last 3-4 days. She went to her primary care physician's office for routine checkup and blood work showed she had a hemoglobin of 7. She is now being referred for a mission. She denies any hematemesis, rectal bleeding, hematuria.   Review of Systems:  Apart from history of present illness other systems negative.  Past Medical History  Diagnosis Date  . Diastolic heart failure   . Ovarian cancer   . Hypertension   . Atrial fibrillation   . Tachy-brady syndrome 2008    s/p PPM implantation by Dr Amil Amen  . Colon cancer   . Arthritis   . Anemia    Past Surgical History  Procedure Date  . Abdominal hysterectomy   . Oophorectomy   . Colonoscopy 03/30/2011    Procedure: COLONOSCOPY;  Surgeon: Barrie Folk, MD;  Location: [facility] ENDOSCOPY;  Service: Endoscopy;  Laterality: N/A;  . Esophagogastroduodenoscopy 03/30/2011    Procedure: ESOPHAGOGASTRODUODENOSCOPY (EGD);  Surgeon: Barrie Folk, MD;  Location: [facility] ENDOSCOPY;  Service: Endoscopy;  Laterality: N/A;  . Colon resection 04/03/2011    Procedure: COLON RESECTION LAPAROSCOPIC;  Surgeon: Atilano Ina, MD;  Location: [facility] OR;  Service: General;  Laterality: N/A;  Laparoscopic, turned open sigmoid colon resection, lysis of adhesions x 2.5 hours, rigid proctoscopy.  . Pacemaker placement 2008    MDT EnRhythm implanted by Dr Amil Amen, generator change (MDT Adapta L) by Dr Johney Frame 08/04/11   Social History:  Patient lives alone. She is a widower. She does not smoke cigarettes. She does not drink alcohol. She is fairly independent.  Allergies  Allergen Reactions  . Lasix (Furosemide) Swelling  . Penicillins Nausea And Vomiting and Swelling    Lip  swelling and redness  . Sulfa Antibiotics Rash    Family History  Problem Relation Age of Onset  . Anesthesia problems Neg Hx   . Hypotension Neg Hx   . Malignant hyperthermia Neg Hx   . Pseudochol deficiency Neg Hx   . Cancer Mother     ovarian      Prior to Admission medications   Medication Sig Start Date End Date Taking? Authorizing Provider  bumetanide (BUMEX) 1 MG tablet Take 1 tablet (1 mg total) by mouth daily. 06/10/11 06/09/12 Yes Marinda Elk, MD  calcium-vitamin D (OSCAL WITH D) 500-200 MG-UNIT per tablet Take 1 tablet by mouth 2 (two) times daily.   Yes Historical Provider, MD  diltiazem (CARDIZEM CD) 240 MG 24 hr capsule Take 240 mg by mouth daily.   Yes Historical Provider, MD  feeding supplement (ENSURE IMMUNE HEALTH) LIQD Take 237 mLs by mouth daily.    Yes Historical Provider, MD  magnesium hydroxide (MILK OF MAGNESIA) 400 MG/5ML suspension Take 15 mLs by mouth at bedtime.   Yes Historical Provider, MD  pantoprazole (PROTONIX) 40 MG tablet Take 40 mg by mouth daily.   Yes Historical Provider, MD  polysaccharide iron (NIFEREX) 150 MG CAPS capsule Take 150 mg by mouth every morning.    Yes Historical Provider, MD  potassium chloride SA (K-DUR,KLOR-CON) 20 MEQ tablet Take 1 tablet (20 mEq total) by mouth daily. 06/10/11 06/09/12 Yes Marinda Elk, MD  ramipril (ALTACE) 10 MG capsule Take 10 mg by  mouth daily.     Yes Historical Provider, MD  sotalol (BETAPACE) 160 MG tablet Take 1 tablet (160 mg total) by mouth 2 (two) times daily. 09/28/11  Yes Hillis Range, MD  warfarin (COUMADIN) 3 MG tablet Take 3-6 mg by mouth daily. On Mon, Wed, Frid takes 5 mg, on Tues, Thur, Sat, Sun 10 mg.   Yes Historical Provider, MD  albuterol (PROVENTIL HFA;VENTOLIN HFA) 108 (90 BASE) MCG/ACT inhaler Inhale 2 puffs into the lungs every 6 (six) hours as needed. For shortness of breath/wheezing 06/10/11 06/09/12  Marinda Elk, MD  denosumab (PROLIA) 60 MG/ML SOLN Inject 60 mg into  the skin every 6 (six) months.     Historical Provider, MD   Physical Exam: Filed Vitals:   12/22/11 1253 12/22/11 1405 12/22/11 1435 12/22/11 1500  BP: 100/65 112/55 115/56 120/98  Pulse: 60  60 60  Temp:   98.1 F (36.7 C) 97.9 F (36.6 C)  TempSrc:      Resp: 20 18 18 18   Height:      Weight:      SpO2: 96% 98%       General:  She does look systemically well. She does not look particularly pale. There is no jaundice.  Eyes: Within normal limits.  ENT: No abnormalities.  Neck: No lymphadenopathy.  Cardiovascular: Heart sounds are present and irregularly irregular consistent with atrial fibrillation.  Respiratory: Lung fields are clear.  Abdomen: Soft, nontender, no hepatosplenomegaly. No masses.  Skin: No rash.  Musculoskeletal: Age-related osteoarthritis.  Psychiatric: Appropriate affect.  Neurologic: Alert and orientated without any focal neurological signs.  Labs on Admission:  Basic Metabolic Panel:  Lab 12/22/11 1610  NA 140  K 3.9  CL 103  CO2 28  GLUCOSE 104*  BUN 29*  CREATININE 1.01  CALCIUM 9.4  MG --  PHOS --       CBC:  Lab 12/22/11 1105  WBC 5.7  NEUTROABS 4.0  HGB 7.1*  HCT 25.2*  MCV 72.4*  PLT 320      BNP (last 3 results)  Basename 06/03/11 0120 06/02/11 0941 03/29/11  PROBNP 5920.0* 2652.0* 2526.0*      Radiological Exams on Admission:     Assessment/Plan   1. Microcytic symptomatic anemia. No indication of obvious bleeding. 2. Chronic atrial fibrillation on chronic anticoagulation. INR therapeutic. 3. Recent history of carcinoma of the sigmoid colon, status post sigmoid colectomy in January 2013. 4. Tachybradycardia syndrome, status post pacemaker. 5. Hypertension. 6. Obesity. Plan: 1. Admit to telemetry. 2. 2 units blood transfusion. 3. Monitor hemoglobin closely. 4. Gastroenterology consultation. She may need endoscopic procedure.  Code Status: Full code.   Family Communication: Plan discuss  with patient at the bedside.   Disposition Plan: Home when medically stable. Time spent: 45 minutes.  Wilson Singer [facility] Pager [phone].  If 7PM-7AM, please contact night-coverage [email or web address] Password [facility] 12/22/2011, 3:32 PM

## 2011-12-23 DIAGNOSIS — K921 Melena: Secondary | ICD-10-CM

## 2011-12-23 DIAGNOSIS — D509 Iron deficiency anemia, unspecified: Secondary | ICD-10-CM

## 2011-12-23 DIAGNOSIS — Z85038 Personal history of other malignant neoplasm of large intestine: Secondary | ICD-10-CM

## 2011-12-23 DIAGNOSIS — I10 Essential (primary) hypertension: Secondary | ICD-10-CM

## 2011-12-23 DIAGNOSIS — I4891 Unspecified atrial fibrillation: Secondary | ICD-10-CM

## 2011-12-23 DIAGNOSIS — D649 Anemia, unspecified: Secondary | ICD-10-CM

## 2011-12-23 LAB — TYPE AND SCREEN
ABO/RH(D): O POS
Unit division: 0

## 2011-12-23 LAB — COMPREHENSIVE METABOLIC PANEL
Albumin: 3.3 g/dL — ABNORMAL LOW (ref 3.5–5.2)
Alkaline Phosphatase: 45 U/L (ref 39–117)
BUN: 22 mg/dL (ref 6–23)
Calcium: 8.8 mg/dL (ref 8.4–10.5)
GFR calc Af Amer: 58 mL/min — ABNORMAL LOW (ref >90)
Glucose, Bld: 83 mg/dL (ref 70–99)
Potassium: 4.1 mEq/L (ref 3.5–5.1)
Sodium: 138 mEq/L (ref 135–145)
Total Protein: 6.5 g/dL (ref 6.0–8.3)

## 2011-12-23 LAB — CBC
HCT: 29 % — ABNORMAL LOW (ref 36.0–46.0)
Hemoglobin: 8.9 g/dL — ABNORMAL LOW (ref 12.0–15.0)
MCV: 75.1 fL — ABNORMAL LOW (ref 78.0–100.0)
RBC: 3.86 MIL/uL — ABNORMAL LOW (ref 3.87–5.11)
RDW: 17.8 % — ABNORMAL HIGH (ref 11.5–15.5)
WBC: 4.5 10*3/uL (ref 4.0–10.5)

## 2011-12-23 LAB — FERRITIN: Ferritin: <1 ng/mL — ABNORMAL LOW (ref 10–291)

## 2011-12-23 LAB — VITAMIN B12: Vitamin B-12: 261 pg/mL (ref 211–911)

## 2011-12-23 LAB — OCCULT BLOOD, POC DEVICE: Fecal Occult Bld: POSITIVE

## 2011-12-23 LAB — IRON AND TIBC: Saturation Ratios: 3 % — ABNORMAL LOW (ref 20–55)

## 2011-12-23 LAB — PROTIME-INR: INR: 2.09 — ABNORMAL HIGH (ref 0.00–1.49)

## 2011-12-23 MED ORDER — SODIUM CHLORIDE 0.9 % IJ SOLN
INTRAMUSCULAR | Status: AC
  Administered 2011-12-23: 3 mL
  Filled 2011-12-23: qty 3

## 2011-12-23 MED ORDER — VITAMIN K1 10 MG/ML IJ SOLN
2.0000 mg | Freq: Once | INTRAMUSCULAR | Status: AC
  Administered 2011-12-23: 2 mg via SUBCUTANEOUS
  Filled 2011-12-23: qty 1

## 2011-12-23 MED ORDER — PEG 3350-KCL-NA BICARB-NACL 420 G PO SOLR
ORAL | Status: AC
  Filled 2011-12-23: qty 4000

## 2011-12-23 MED ORDER — PEG 3350-KCL-NA BICARB-NACL 420 G PO SOLR
4000.0000 mL | Freq: Once | ORAL | Status: AC
  Administered 2011-12-24: 4000 mL via ORAL
  Filled 2011-12-23: qty 4000

## 2011-12-23 MED ORDER — SODIUM CHLORIDE 0.9 % IV SOLN
INTRAVENOUS | Status: DC
  Administered 2011-12-24: 05:00:00 via INTRAVENOUS

## 2011-12-23 NOTE — Progress Notes (Signed)
UR Chart Review Completed  

## 2011-12-23 NOTE — Progress Notes (Signed)
Nutrition Education   Pt requested to see RD r/t Vitamin K intake. Questions and concerns  were addressed. No additional follow-up indicated.  #[phone]

## 2011-12-23 NOTE — Consult Note (Signed)
Reason for Consult:anemia Referring Physician: Hospitalist  [name] is an 78 y.o. female.  HPI: 78 yr old female admitted thru the ED yesterday. She saw her PCP Gennette Pac PA, at [facility].    She was seen for annual physical and noted to be anemic.  She was referred to the ED for further evaluation. Admission hemoglobin noted to be 7.1. She denies any symptoms related to her anemia.  Hx of sigmoid adenocarcinoma with a resection in January of this year by Dr. Gaynelle Adu. The colonoscopy was not complete due to tortuous colon.  Hx of atrial fibrillation and has a pacemaker.  Appetite has been good. No weight loss in the past 2-3 months.  She has a BM daily. Her stools are dark brown. Presently taking Iron.  performed, which demonstrated a mass in her sigmoid colon.  She received 2 units of PRBCs since admission.  Fecal occult blood was positive. Hx of ovarian cancer 9-10 yrs ago.  Past Medical History  Diagnosis Date  . Diastolic heart failure   . Ovarian cancer   . Hypertension   . Atrial fibrillation   . Tachy-brady syndrome 2008    s/p PPM implantation by Dr Amil Amen  . Colon cancer   . Arthritis   . Anemia     Past Surgical History  Procedure Date  . Abdominal hysterectomy   . Oophorectomy   . Colonoscopy 03/30/2011    Procedure: COLONOSCOPY;  Surgeon: Barrie Folk, MD;  Location: [facility] ENDOSCOPY;  Service: Endoscopy;  Laterality: N/A;  . Esophagogastroduodenoscopy 03/30/2011    Procedure: ESOPHAGOGASTRODUODENOSCOPY (EGD);  Surgeon: Barrie Folk, MD;  Location: [facility] ENDOSCOPY;  Service: Endoscopy;  Laterality: N/A;  . Colon resection 04/03/2011    Procedure: COLON RESECTION LAPAROSCOPIC;  Surgeon: Atilano Ina, MD;  Location: [facility] OR;  Service: General;  Laterality: N/A;  Laparoscopic, turned open sigmoid colon resection, lysis of adhesions x 2.5 hours, rigid proctoscopy.  . Pacemaker placement 2008    MDT EnRhythm implanted by Dr Amil Amen, generator change (MDT Adapta L)  by Dr Johney Frame 08/04/11    Family History  Problem Relation Age of Onset  . Anesthesia problems Neg Hx   . Hypotension Neg Hx   . Malignant hyperthermia Neg Hx   . Pseudochol deficiency Neg Hx   . Cancer Mother     ovarian    Social History:  reports that she has never smoked. She has never used smokeless tobacco. She reports that she does not drink alcohol or use illicit drugs.  Allergies:  Allergies  Allergen Reactions  . Lasix (Furosemide) Swelling  . Penicillins Nausea And Vomiting and Swelling    Lip swelling and redness  . Sulfa Antibiotics Rash    Medications: I have reviewed the patient's current medications.  Results for orders placed during the hospital encounter of 12/22/11 (from the past 48 hour(s))  URINALYSIS, ROUTINE W REFLEX MICROSCOPIC     Status: Normal   Collection Time   12/22/11 11:04 AM      Component Value Range Comment   Color, Urine YELLOW  YELLOW    APPearance CLEAR  CLEAR    Specific Gravity, Urine 1.010  1.005 - 1.030    pH 6.5  5.0 - 8.0    Glucose, UA NEGATIVE  NEGATIVE mg/dL    Hgb urine dipstick NEGATIVE  NEGATIVE    Bilirubin Urine NEGATIVE  NEGATIVE    Ketones, ur NEGATIVE  NEGATIVE mg/dL    Protein, ur NEGATIVE  NEGATIVE mg/dL    Urobilinogen, UA 0.2  0.0 - 1.0 mg/dL    Nitrite NEGATIVE  NEGATIVE    Leukocytes, UA NEGATIVE  NEGATIVE MICROSCOPIC NOT DONE ON URINES WITH NEGATIVE PROTEIN, BLOOD, LEUKOCYTES, NITRITE, OR GLUCOSE <1000 mg/dL.  CBC WITH DIFFERENTIAL     Status: Abnormal   Collection Time   12/22/11 11:05 AM      Component Value Range Comment   WBC 5.7  4.0 - 10.5 K/uL    RBC 3.48 (*) 3.87 - 5.11 MIL/uL    Hemoglobin 7.1 (*) 12.0 - 15.0 g/dL    HCT 16.1 (*) 09.6 - 46.0 %    MCV 72.4 (*) 78.0 - 100.0 fL    MCH 20.4 (*) 26.0 - 34.0 pg    MCHC 28.2 (*) 30.0 - 36.0 g/dL    RDW 04.5 (*) 40.9 - 15.5 %    Platelets 320  150 - 400 K/uL    Neutrophils Relative 70  43 - 77 %    Neutro Abs 4.0  1.7 - 7.7 K/uL    Lymphocytes  Relative 21  12 - 46 %    Lymphs Abs 1.2  0.7 - 4.0 K/uL    Monocytes Relative 8  3 - 12 %    Monocytes Absolute 0.5  0.1 - 1.0 K/uL    Eosinophils Relative 1  0 - 5 %    Eosinophils Absolute 0.1  0.0 - 0.7 K/uL    Basophils Relative 0  0 - 1 %    Basophils Absolute 0.0  0.0 - 0.1 K/uL   BASIC METABOLIC PANEL     Status: Abnormal   Collection Time   12/22/11 11:05 AM      Component Value Range Comment   Sodium 140  135 - 145 mEq/L    Potassium 3.9  3.5 - 5.1 mEq/L    Chloride 103  96 - 112 mEq/L    CO2 28  19 - 32 mEq/L    Glucose, Bld 104 (*) 70 - 99 mg/dL    BUN 29 (*) 6 - 23 mg/dL    Creatinine, Ser 8.11  0.50 - 1.10 mg/dL    Calcium 9.4  8.4 - 91.4 mg/dL    GFR calc non Af Amer 49 (*) >90 mL/min    GFR calc Af Amer 56 (*) >90 mL/min   ABO/RH     Status: Normal   Collection Time   12/22/11 11:05 AM      Component Value Range Comment   ABO/RH(D) O POS     VITAMIN B12     Status: Normal   Collection Time   12/22/11 11:05 AM      Component Value Range Comment   Vitamin B-12 261  211 - 911 pg/mL   FOLATE     Status: Normal   Collection Time   12/22/11 11:05 AM      Component Value Range Comment   Folate >20.0     IRON AND TIBC     Status: Abnormal   Collection Time   12/22/11 11:05 AM      Component Value Range Comment   Iron 17 (*) 42 - 135 ug/dL    TIBC 782 (*) 956 - 213 ug/dL    Saturation Ratios 3 (*) 20 - 55 %    UIBC 491 (*) 125 - 400 ug/dL   FERRITIN     Status: Abnormal   Collection Time   12/22/11 11:05 AM  Component Value Range Comment   Ferritin <1 (*) 10 - 291 ng/mL   RETICULOCYTES     Status: Abnormal   Collection Time   12/22/11 11:05 AM      Component Value Range Comment   Retic Ct Pct 1.7  0.4 - 3.1 %    RBC. 3.50 (*) 3.87 - 5.11 MIL/uL    Retic Count, Manual 59.5  19.0 - 186.0 K/uL   TYPE AND SCREEN     Status: Normal   Collection Time   12/22/11 11:44 AM      Component Value Range Comment   ABO/RH(D) O POS      Antibody Screen NEG       Sample Expiration 12/25/2011      Unit Number [id number]      Blood Component Type RED CELLS,LR      Unit division 00      Status of Unit ISSUED,FINAL      Transfusion Status OK TO TRANSFUSE      Crossmatch Result Compatible      Unit Number [id number]      Blood Component Type RED CELLS,LR      Unit division 00      Status of Unit ISSUED,FINAL      Transfusion Status OK TO TRANSFUSE      Crossmatch Result Compatible     PREPARE RBC (CROSSMATCH)     Status: Normal   Collection Time   12/22/11 11:44 AM      Component Value Range Comment   Order Confirmation ORDER PROCESSED BY BLOOD BANK     APTT     Status: Normal   Collection Time   12/22/11 11:44 AM      Component Value Range Comment   aPTT 31  24 - 37 seconds   PROTIME-INR     Status: Abnormal   Collection Time   12/22/11 11:44 AM      Component Value Range Comment   Prothrombin Time 23.5 (*) 11.6 - 15.2 seconds    INR 2.20 (*) 0.00 - 1.49   OCCULT BLOOD, POC DEVICE     Status: Normal   Collection Time   12/22/11 12:14 PM      Component Value Range Comment   Fecal Occult Bld POSITIVE     OCCULT BLOOD, POC DEVICE     Status: Normal   Collection Time   12/22/11  2:56 PM      Component Value Range Comment   Fecal Occult Bld POSITIVE     COMPREHENSIVE METABOLIC PANEL     Status: Abnormal   Collection Time   12/23/11  5:07 AM      Component Value Range Comment   Sodium 138  135 - 145 mEq/L    Potassium 4.1  3.5 - 5.1 mEq/L    Chloride 104  96 - 112 mEq/L    CO2 28  19 - 32 mEq/L    Glucose, Bld 83  70 - 99 mg/dL    BUN 22  6 - 23 mg/dL    Creatinine, Ser 9.62  0.50 - 1.10 mg/dL    Calcium 8.8  8.4 - 95.2 mg/dL    Total Protein 6.5  6.0 - 8.3 g/dL    Albumin 3.3 (*) 3.5 - 5.2 g/dL    AST 16  0 - 37 U/L    ALT 8  0 - 35 U/L    Alkaline Phosphatase 45  39 - 117 U/L    Total Bilirubin 0.4  0.3 - 1.2 mg/dL    GFR calc non Af Amer 50 (*) >90 mL/min    GFR calc Af Amer 58 (*) >90 mL/min   CBC     Status: Abnormal    Collection Time   12/23/11  5:07 AM      Component Value Range Comment   WBC 4.5  4.0 - 10.5 K/uL    RBC 3.86 (*) 3.87 - 5.11 MIL/uL    Hemoglobin 8.9 (*) 12.0 - 15.0 g/dL DELTA CHECK NOTED   HCT 29.0 (*) 36.0 - 46.0 %    MCV 75.1 (*) 78.0 - 100.0 fL    MCH 23.1 (*) 26.0 - 34.0 pg    MCHC 30.7  30.0 - 36.0 g/dL    RDW 16.1 (*) 09.6 - 15.5 %    Platelets 280  150 - 400 K/uL   PROTIME-INR     Status: Abnormal   Collection Time   12/23/11  5:07 AM      Component Value Range Comment   Prothrombin Time 22.6 (*) 11.6 - 15.2 seconds    INR 2.09 (*) 0.00 - 1.49     No results found.  Review of Systems  Respiratory: Negative for sputum production.    Blood pressure 117/56, pulse 61, temperature 98 F (36.7 C), temperature source Oral, resp. rate 20, height 5\' 7"  (1.702 m), weight 185 lb 14.4 oz (84.324 kg), SpO2 98.00%. Physical ExamAlert and oriented. Skin warm and dry. Oral mucosa is moist.   . Sclera anicteric, conjunctivae is pink. Thyroid not enlarged. No cervical lymphadenopathy. Lungs clear. Heart regular rate and rhythm.  Abdomen is soft. Bowel sounds are positive. No hepatomegaly. No abdominal masses felt. No tenderness.  No edema to lower extremities.    Assessment/Plan: Probably lower GI bleed. Hx of sigmoid adenocarcinoma with resection in January of this year. Colonoscopy was incomplete. Colonic neoplasm needs to be ruled out. Will discuss with Dr. Karilyn Cota.  Coumadin needs to be held. PT/INR in am. Consider Vitamin K 2mg . I will discuss this with Dr. Lendell Caprice.  SETZER,TERRI W 12/23/2011, 8:33 AM    GI attending note; Patient interviewed and examined. Lab data reviewed. C patient is very pleasant 78 year old independent Caucasian female who has iron deficiency anemia, heme positive stools and she has received 2 units of PRBCs her hemoglobin has gone up from 7.1 to  8.9. She is chronically anticoagulated because of atrial fibrillation. Other than chronic constipation she has no  GI complaints. She denies heartburn dysphagia nausea vomiting abdominal pain melena or rectal bleeding. History is significant for resection of sigmoid colon for T3 N0 MX lesion in January 2013. Preop colonoscopy however was not complete. Her abdominal examination is normal. Given her age we could be dealing with GI angiodysplasia also need to rule out colonic neoplasm and if colonoscopy is negative will need to review upper GI tract. Recommendations; Diagnostic colonoscopy followed by esophagogastroduodenoscopy if indicated. She'll have H&H and INR in a.m. Will also check CEA level. Patient and her son are both agreeable for to proceed with endoscopic evaluation.

## 2011-12-23 NOTE — Care Management Note (Unsigned)
    Page 1 of 1   12/23/2011     3:11:20 PM   CARE MANAGEMENT NOTE 12/23/2011  Patient:  [name], [name]   Account Number:  [id number]  Date Initiated:  12/23/2011  Documentation initiated by:  Rosemary Holms  Subjective/Objective Assessment:   Pt admitted from home where she lives alone. States she has HH Wed-Friday from [facility]. Admitted with anemia.     Action/Plan:   DC with HH. Will f/u with daughter regarding agency   Anticipated DC Date:  12/25/2011   Anticipated DC Plan:  HOME W HOME HEALTH SERVICES      DC Planning Services  CM consult      Choice offered to / List presented to:             Status of service:  In process, will continue to follow Medicare Important Message given?   (If response is "NO", the following Medicare IM given date fields will be blank) Date Medicare IM given:   Date Additional Medicare IM given:    Discharge Disposition:    Per UR Regulation:    If discussed at Long Length of Stay Meetings, dates discussed:    Comments:  12/23/11 1300 Janal Haak Leanord Hawking RN BSN CM

## 2011-12-23 NOTE — Progress Notes (Signed)
Chart reviewed.  Discussed with Ms. Setzer.  Subjective: No complaints. No bleeding. Feels stronger after blood transfusion.  Objective: Vital signs in last 24 hours: Filed Vitals:   12/22/11 2125 12/23/11 0052 12/23/11 0601 12/23/11 1419  BP: 135/78  117/56 130/62  Pulse: 61  61 82  Temp: 98.5 F (36.9 C)  98 F (36.7 C) 98.2 F (36.8 C)  TempSrc: Oral   Oral  Resp: 18  20 18   Height:      Weight:   84.324 kg (185 lb 14.4 oz)   SpO2: 97% 97% 98% 99%   Weight change:   Intake/Output Summary (Last 24 hours) at 12/23/11 1421 Last data filed at 12/23/11 1246  Gross per 24 hour  Intake 2643.1 ml  Output      0 ml  Net 2643.1 ml   General: Alert. Oriented. Lungs clear to auscultation bilaterally without wheeze rhonchi or rales Cardiovascular regular rate rhythm without murmurs gallops rubs Abdomen soft nontender nondistended Extremities no clubbing cyanosis or edema  Lab Results: Basic Metabolic Panel:  Lab 12/23/11 1610 12/22/11 1105  NA 138 140  K 4.1 3.9  CL 104 103  CO2 28 28  GLUCOSE 83 104*  BUN 22 29*  CREATININE 0.98 1.01  CALCIUM 8.8 9.4  MG -- --  PHOS -- --   Liver Function Tests:  Lab 12/23/11 0507  AST 16  ALT 8  ALKPHOS 45  BILITOT 0.4  PROT 6.5  ALBUMIN 3.3*   No results found for this basename: LIPASE:2,AMYLASE:2 in the last 168 hours No results found for this basename: AMMONIA:2 in the last 168 hours CBC:  Lab 12/23/11 0507 12/22/11 1105  WBC 4.5 5.7  NEUTROABS -- 4.0  HGB 8.9* 7.1*  HCT 29.0* 25.2*  MCV 75.1* 72.4*  PLT 280 320   Cardiac Enzymes: No results found for this basename: CKTOTAL:3,CKMB:3,CKMBINDEX:3,TROPONINI:3 in the last 168 hours BNP: No results found for this basename: PROBNP:3 in the last 168 hours D-Dimer: No results found for this basename: DDIMER:2 in the last 168 hours CBG: No results found for this basename: GLUCAP:6 in the last 168 hours Hemoglobin A1C: No results found for this basename: HGBA1C in  the last 168 hours Fasting Lipid Panel: No results found for this basename: CHOL,HDL,LDLCALC,TRIG,CHOLHDL,LDLDIRECT in the last 960 hours Thyroid Function Tests: No results found for this basename: TSH,T4TOTAL,FREET4,T3FREE,THYROIDAB in the last 168 hours Coagulation:  Lab 12/23/11 0507 12/22/11 1144  LABPROT 22.6* 23.5*  INR 2.09* 2.20*   Anemia Panel:  Lab 12/22/11 1105  VITAMINB12 261  FOLATE >20.0  FERRITIN <1*  TIBC 508*  IRON 17*  RETICCTPCT 1.7   Urine Drug Screen: Drugs of Abuse  No results found for this basename: labopia, cocainscrnur, labbenz, amphetmu, thcu, labbarb    Alcohol Level: No results found for this basename: ETH:2 in the last 168 hours Urinalysis:  Lab 12/22/11 1104  COLORURINE YELLOW  LABSPEC 1.010  PHURINE 6.5  GLUCOSEU NEGATIVE  HGBUR NEGATIVE  BILIRUBINUR NEGATIVE  KETONESUR NEGATIVE  PROTEINUR NEGATIVE  UROBILINOGEN 0.2  NITRITE NEGATIVE  LEUKOCYTESUR NEGATIVE   Scheduled Meds:   . bumetanide  1 mg Oral Daily  . calcium-vitamin D  1 tablet Oral BID  . diltiazem  240 mg Oral Daily  . feeding supplement  237 mL Oral Daily  . iron polysaccharides  150 mg Oral BH-q7a  . magnesium hydroxide  15 mL Oral QHS  . pantoprazole  40 mg Oral Daily  . phytonadione  2 mg Subcutaneous  Once  . potassium chloride SA  20 mEq Oral Daily  . ramipril  10 mg Oral Daily  . sodium chloride  3 mL Intravenous Q12H  . sotalol  160 mg Oral BID  . DISCONTD: warfarin  3-6 mg Oral Daily  . DISCONTD: Warfarin - Physician Dosing Inpatient   Does not apply q1800   Continuous Infusions:  PRN Meds:.albuterol, ondansetron (ZOFRAN) IV, ondansetron Assessment/Plan: Active Problems:  Pacemaker-Medtronic  Hypertension  Atrial fibrillation  Anemia  Cancer of sigmoid colon s/p sigmoid colectomy for T3N0 lesion on 1/18  Tachycardia-bradycardia  Agree with vitamin K which I have ordered. Patient has not had any Coumadin since admission. Anticipate endoscopy  tomorrow. Appreciate GI.   LOS: 1 day   Reiana Poteet L 12/23/2011, 2:21 PM

## 2011-12-24 ENCOUNTER — Encounter (HOSPITAL_COMMUNITY): Payer: Self-pay | Admitting: *Deleted

## 2011-12-24 ENCOUNTER — Encounter (HOSPITAL_COMMUNITY)
Admission: EM | Disposition: A | Discharge status: Home / Self Care | Payer: Self-pay | Source: Home / Self Care | Attending: Internal Medicine

## 2011-12-24 DIAGNOSIS — K5730 Diverticulosis of large intestine without perforation or abscess without bleeding: Secondary | ICD-10-CM

## 2011-12-24 DIAGNOSIS — D126 Benign neoplasm of colon, unspecified: Secondary | ICD-10-CM

## 2011-12-24 HISTORY — PX: COLONOSCOPY: SHX5424

## 2011-12-24 LAB — CEA: CEA: 10 ng/mL — ABNORMAL HIGH (ref 0.0–5.0)

## 2011-12-24 LAB — PROTIME-INR: Prothrombin Time: 18.9 seconds — ABNORMAL HIGH (ref 11.6–15.2)

## 2011-12-24 SURGERY — COLONOSCOPY
Anesthesia: Moderate Sedation

## 2011-12-24 MED ORDER — SODIUM CHLORIDE 0.45 % IV SOLN
Freq: Once | INTRAVENOUS | Status: AC
  Administered 2011-12-24: 15:00:00 via INTRAVENOUS

## 2011-12-24 MED ORDER — MEPERIDINE HCL 50 MG/ML IJ SOLN
INTRAMUSCULAR | Status: AC
  Filled 2011-12-24: qty 1

## 2011-12-24 MED ORDER — STERILE WATER FOR IRRIGATION IR SOLN
Status: DC | PRN
  Administered 2011-12-24: 15:00:00

## 2011-12-24 MED ORDER — MIDAZOLAM HCL 5 MG/5ML IJ SOLN
INTRAMUSCULAR | Status: AC
  Filled 2011-12-24: qty 5

## 2011-12-24 MED ORDER — MEPERIDINE HCL 50 MG/ML IJ SOLN
INTRAMUSCULAR | Status: DC | PRN
  Administered 2011-12-24: 15 mg via INTRAVENOUS
  Administered 2011-12-24: 10 mg via INTRAVENOUS

## 2011-12-24 MED ORDER — VITAMIN K1 10 MG/ML IJ SOLN
1.0000 mg | Freq: Once | INTRAMUSCULAR | Status: AC
  Administered 2011-12-24: 1 mg via SUBCUTANEOUS
  Filled 2011-12-24: qty 1

## 2011-12-24 MED ORDER — SODIUM CHLORIDE 0.9 % IJ SOLN
INTRAMUSCULAR | Status: AC
  Filled 2011-12-24: qty 3

## 2011-12-24 MED ORDER — MIDAZOLAM HCL 5 MG/5ML IJ SOLN
INTRAMUSCULAR | Status: DC | PRN
  Administered 2011-12-24: 1.5 mg via INTRAVENOUS
  Administered 2011-12-24: 0.5 mg via INTRAVENOUS

## 2011-12-24 NOTE — Op Note (Signed)
COLONOSCOPY PROCEDURE REPORT  PATIENT:  [name]  MR#:  [id number] Birthdate:  1923-01-24, 78 y.o., female Endoscopist:  Dr. Malissa Hippo, MD Procedure Date: 12/24/2011  Procedure:   Colonoscopy  Indications: Patient is 78 year old Caucasian female who presents with anemia, positive stools and iron studies confirmed iron deficiency. Patient had sigmoid colon resection in January 2013 for stage IIa adenocarcinoma. She could not have a complete exam on her initial colonoscopy prior to surgery. Patient has no GI symptoms other than chronic constipation.  Informed Consent:  The procedure and risks were reviewed with the patient and informed consent was obtained.  Medications:  Demerol 25mg  IV Versed 2 mg IV  Description of procedure:  After a digital rectal exam was performed, that colonoscope was advanced from the anus through the rectum and colon to the area of the cecum, ileocecal valve and appendiceal orifice. The cecum was deeply intubated. These structures were well-seen and photographed for the record. From the level of the cecum and ileocecal valve, the scope was slowly and cautiously withdrawn. The mucosal surfaces were carefully surveyed utilizing scope tip to flexion to facilitate fold flattening as needed. The scope was pulled down into the rectum where a thorough exam including retroflexion was performed.  Findings:   Prep satisfactory. Redundant colon with end to side colonic anastomosis at 22 cm from anal margin. 2 cm broad-based ulcerated lesion across the ileocecal valve. Appearance typical of carcinoma. Multiple biopsies taken. Few scattered diverticula primarily at sigmoid and descending colon. Normal rectal mucosa and anorectal junction.  Therapeutic/Diagnostic Maneuvers Performed:  See above  Complications:  None  Cecal Withdrawal Time:  8 minutes  Impression:  Examination performed to cecum. 2 cm flat ulcerated lesion across the ileocecal valve. Endoscopic  appearance typical of adenocarcinoma. Redundant colon with scattered diverticula involving sigmoid and descending colon. Colonic anastomosis at 22 cm from anal margin.   Recommendations:  Advance diet. Patient will need surgery related for biopsy results before further immunizations made.  REHMAN,NAJEEB U  12/24/2011 4:04 PM  CC: Dr. Bennie Pierini, FNP & Dr. Bonnetta Barry ref. provider found

## 2011-12-24 NOTE — Discharge Summary (Signed)
Physician Discharge Summary  Patient ID: [name] MRN: [id number] DOB/AGE: 1922/06/18 78 y.o.  Admit date: 12/22/2011 Discharge date: 12/24/2011  Discharge Diagnoses:  Active Problems:  Colonic mass, ileocecal valve concerning for neoplasm, pathology pending.  Pacemaker-Medtronic  Hypertension  Atrial fibrillation  Anemia  Cancer of sigmoid colon s/p sigmoid colectomy for T3N0 lesion on 1/18  Tachycardia-bradycardia Chronic anticoagulation  Tests pending at the time of discharge:  Pathology from colonoscopy, biopsy of mass at ileocecal valve.    Medication List     As of 12/24/2011  4:54 PM    STOP taking these medications         warfarin 3 MG tablet   Commonly known as: COUMADIN      TAKE these medications         albuterol 108 (90 BASE) MCG/ACT inhaler   Commonly known as: PROVENTIL HFA;VENTOLIN HFA   Inhale 2 puffs into the lungs every 6 (six) hours as needed. For shortness of breath/wheezing      bumetanide 1 MG tablet   Commonly known as: BUMEX   Take 1 tablet (1 mg total) by mouth daily.      calcium-vitamin D 500-200 MG-UNIT per tablet   Commonly known as: OSCAL WITH D   Take 1 tablet by mouth 2 (two) times daily.      diltiazem 240 MG 24 hr capsule   Commonly known as: CARDIZEM CD   Take 240 mg by mouth daily.      feeding supplement Liqd   Take 237 mLs by mouth daily.      magnesium hydroxide 400 MG/5ML suspension   Commonly known as: MILK OF MAGNESIA   Take 15 mLs by mouth at bedtime.      pantoprazole 40 MG tablet   Commonly known as: PROTONIX   Take 40 mg by mouth daily.      polysaccharide iron 150 MG capsule   Generic drug: iron polysaccharides   Take 150 mg by mouth every morning.      potassium chloride SA 20 MEQ tablet   Commonly known as: K-DUR,KLOR-CON   Take 1 tablet (20 mEq total) by mouth daily.      PROLIA 60 MG/ML Soln injection   Generic drug: denosumab   Inject 60 mg into the skin every 6 (six) months.      ramipril  10 MG capsule   Commonly known as: ALTACE   Take 10 mg by mouth daily.      sotalol 160 MG tablet   Commonly known as: BETAPACE   Take 1 tablet (160 mg total) by mouth 2 (two) times daily.       Followup:  Dr. Karilyn Cota will arrange follow up with Dr. Gaynelle Adu after biopsy results back  Disposition: 01-Home or Self Care  Discharged Condition: stable  Consults:  Rehman  Labs:   Results for orders placed during the hospital encounter of 12/22/11 (from the past 48 hour(s))  COMPREHENSIVE METABOLIC PANEL     Status: Abnormal   Collection Time   12/23/11  5:07 AM      Component Value Range Comment   Sodium 138  135 - 145 mEq/L    Potassium 4.1  3.5 - 5.1 mEq/L    Chloride 104  96 - 112 mEq/L    CO2 28  19 - 32 mEq/L    Glucose, Bld 83  70 - 99 mg/dL    BUN 22  6 - 23 mg/dL    Creatinine, Ser  0.98  0.50 - 1.10 mg/dL    Calcium 8.8  8.4 - 16.1 mg/dL    Total Protein 6.5  6.0 - 8.3 g/dL    Albumin 3.3 (*) 3.5 - 5.2 g/dL    AST 16  0 - 37 U/L    ALT 8  0 - 35 U/L    Alkaline Phosphatase 45  39 - 117 U/L    Total Bilirubin 0.4  0.3 - 1.2 mg/dL    GFR calc non Af Amer 50 (*) >90 mL/min    GFR calc Af Amer 58 (*) >90 mL/min   CBC     Status: Abnormal   Collection Time   12/23/11  5:07 AM      Component Value Range Comment   WBC 4.5  4.0 - 10.5 K/uL    RBC 3.86 (*) 3.87 - 5.11 MIL/uL    Hemoglobin 8.9 (*) 12.0 - 15.0 g/dL DELTA CHECK NOTED   HCT 29.0 (*) 36.0 - 46.0 %    MCV 75.1 (*) 78.0 - 100.0 fL    MCH 23.1 (*) 26.0 - 34.0 pg    MCHC 30.7  30.0 - 36.0 g/dL    RDW 09.6 (*) 04.5 - 15.5 %    Platelets 280  150 - 400 K/uL   PROTIME-INR     Status: Abnormal   Collection Time   12/23/11  5:07 AM      Component Value Range Comment   Prothrombin Time 22.6 (*) 11.6 - 15.2 seconds    INR 2.09 (*) 0.00 - 1.49   HEMOGLOBIN AND HEMATOCRIT, BLOOD     Status: Abnormal   Collection Time   12/24/11  5:06 AM      Component Value Range Comment   Hemoglobin 9.6 (*) 12.0 - 15.0 g/dL     HCT 40.9 (*) 81.1 - 46.0 %   PROTIME-INR     Status: Abnormal   Collection Time   12/24/11  5:06 AM      Component Value Range Comment   Prothrombin Time 21.2 (*) 11.6 - 15.2 seconds    INR 1.92 (*) 0.00 - 1.49   CEA     Status: Abnormal   Collection Time   12/24/11  5:06 AM      Component Value Range Comment   CEA 10.0 (*) 0.0 - 5.0 ng/mL   PROTIME-INR     Status: Abnormal   Collection Time   12/24/11  1:57 PM      Component Value Range Comment   Prothrombin Time 18.9 (*) 11.6 - 15.2 seconds    INR 1.64 (*) 0.00 - 1.49     Diagnostics:  No results found.  Procedures:  Colonoscopy with biopsy 2 cm flat ulcerated lesion across the ileocecal valve. Endoscopic appearance typical of adenocarcinoma.  Redundant colon with scattered diverticula involving sigmoid and descending colon.  Colonic anastomosis at 22 cm from anal margin.  Full Code   Hospital Course: See H&P for complete admission details.  [name] is a pleasant 78 year old white female who was directly admitted for anemia. She went to her primary care provider complaining of weakness. Outpatient labs showed a hemoglobin of 7. She denied any hematemesis, rectal bleeding. She had a colonoscopy in January of this year which showed a sigmoid mass. She subsequently underwent sigmoid colon resection with Dr. Gaynelle Adu. The colonoscopy was incomplete and the scope was unable to be passed to the cecum.  The patient was transfused 2 units of packed red blood  cells, and GI was consulted. Dr. Karilyn Cota recommended repeat colonoscopy, as the previous colonoscopy did not image the entire colon. An ulcerated mass was found across the ileocecal valve concerning for adenocarcinoma. Pathology at this time is pending. Patient's hemoglobin rose appropriately. She is stable for discharge. Dr. Dionicia Abler will followup the pathology and refer patient back to Dr. Gaynelle Adu. Total time of day of discharge greater than 30 minutes.  Discharge Exam:  See  progress note  Signed: Greysyn [name] L 12/24/2011, 4:54 PM

## 2011-12-24 NOTE — Progress Notes (Signed)
Subjective: No complaints.   Objective: Vital signs in last 24 hours: Filed Vitals:   12/23/11 2109 12/24/11 0534 12/24/11 1006 12/24/11 1454  BP: 143/85 135/77 139/81 140/77  Pulse: 80 61 60   Temp: 98 F (36.7 C) 97.3 F (36.3 C) 97.2 F (36.2 C) 99.1 F (37.3 C)  TempSrc: Oral Oral Oral Oral  Resp: 18 18 18 22   Height:      Weight:      SpO2: 100% 95% 97% 97%   Weight change:   Intake/Output Summary (Last 24 hours) at 12/24/11 1500 Last data filed at 12/23/11 1807  Gross per 24 hour  Intake    360 ml  Output      0 ml  Net    360 ml   General: Alert. Oriented. Lungs clear to auscultation bilaterally without wheeze rhonchi or rales Cardiovascular regular rate rhythm without murmurs gallops rubs Abdomen soft nontender nondistended Extremities no clubbing cyanosis or edema  Lab Results: Basic Metabolic Panel:  Lab 12/23/11 9604 12/22/11 1105  NA 138 140  K 4.1 3.9  CL 104 103  CO2 28 28  GLUCOSE 83 104*  BUN 22 29*  CREATININE 0.98 1.01  CALCIUM 8.8 9.4  MG -- --  PHOS -- --   Liver Function Tests:  Lab 12/23/11 0507  AST 16  ALT 8  ALKPHOS 45  BILITOT 0.4  PROT 6.5  ALBUMIN 3.3*   No results found for this basename: LIPASE:2,AMYLASE:2 in the last 168 hours No results found for this basename: AMMONIA:2 in the last 168 hours CBC:  Lab 12/24/11 0506 12/23/11 0507 12/22/11 1105  WBC -- 4.5 5.7  NEUTROABS -- -- 4.0  HGB 9.6* 8.9* --  HCT 31.4* 29.0* --  MCV -- 75.1* 72.4*  PLT -- 280 320   Cardiac Enzymes: No results found for this basename: CKTOTAL:3,CKMB:3,CKMBINDEX:3,TROPONINI:3 in the last 168 hours BNP: No results found for this basename: PROBNP:3 in the last 168 hours D-Dimer: No results found for this basename: DDIMER:2 in the last 168 hours CBG: No results found for this basename: GLUCAP:6 in the last 168 hours Hemoglobin A1C: No results found for this basename: HGBA1C in the last 168 hours Fasting Lipid Panel: No results found  for this basename: CHOL,HDL,LDLCALC,TRIG,CHOLHDL,LDLDIRECT in the last 540 hours Thyroid Function Tests: No results found for this basename: TSH,T4TOTAL,FREET4,T3FREE,THYROIDAB in the last 168 hours Coagulation:  Lab 12/24/11 1357 12/24/11 0506 12/23/11 0507 12/22/11 1144  LABPROT 18.9* 21.2* 22.6* 23.5*  INR 1.64* 1.92* 2.09* 2.20*   Anemia Panel:  Lab 12/22/11 1105  VITAMINB12 261  FOLATE >20.0  FERRITIN <1*  TIBC 508*  IRON 17*  RETICCTPCT 1.7   Urine Drug Screen: Drugs of Abuse  No results found for this basename: labopia,  cocainscrnur,  labbenz,  amphetmu,  thcu,  labbarb    Alcohol Level: No results found for this basename: ETH:2 in the last 168 hours Urinalysis:  Lab 12/22/11 1104  COLORURINE YELLOW  LABSPEC 1.010  PHURINE 6.5  GLUCOSEU NEGATIVE  HGBUR NEGATIVE  BILIRUBINUR NEGATIVE  KETONESUR NEGATIVE  PROTEINUR NEGATIVE  UROBILINOGEN 0.2  NITRITE NEGATIVE  LEUKOCYTESUR NEGATIVE   Scheduled Meds:    . bumetanide  1 mg Oral Daily  . calcium-vitamin D  1 tablet Oral BID  . diltiazem  240 mg Oral Daily  . feeding supplement  237 mL Oral Daily  . magnesium hydroxide  15 mL Oral QHS  . pantoprazole  40 mg Oral Daily  . phytonadione  1 mg Subcutaneous Once  . polyethylene glycol-electrolytes  4,000 mL Oral Once  . potassium chloride SA  20 mEq Oral Daily  . ramipril  10 mg Oral Daily  . sodium chloride  3 mL Intravenous Q12H  . sodium chloride      . sodium chloride      . sotalol  160 mg Oral BID  . DISCONTD: iron polysaccharides  150 mg Oral [id number]   Continuous Infusions:    . sodium chloride 20 mL/hr at 12/24/11 0518   PRN Meds:.albuterol, ondansetron (ZOFRAN) IV, ondansetron Assessment/Plan: Active Problems:  Pacemaker-[employer]  Hypertension  Atrial fibrillation  Anemia  Cancer of sigmoid colon s/p sigmoid colectomy for T3N0 lesion on 1/18  Tachycardia-bradycardia  Await endo findings.   LOS: 2 days   Matalie Romberger  L 12/24/2011, 3:00 PM

## 2011-12-29 ENCOUNTER — Encounter (HOSPITAL_COMMUNITY): Payer: Self-pay | Admitting: Internal Medicine

## 2011-12-29 ENCOUNTER — Encounter (INDEPENDENT_AMBULATORY_CARE_PROVIDER_SITE_OTHER): Payer: Self-pay | Admitting: *Deleted

## 2011-12-31 ENCOUNTER — Ambulatory Visit (INDEPENDENT_AMBULATORY_CARE_PROVIDER_SITE_OTHER): Payer: Medicare Other | Admitting: General Surgery

## 2011-12-31 ENCOUNTER — Encounter (INDEPENDENT_AMBULATORY_CARE_PROVIDER_SITE_OTHER): Payer: Self-pay | Admitting: General Surgery

## 2011-12-31 VITALS — BP 142/78 | HR 72 | Temp 97.3°F | Resp 16 | Ht 64.0 in | Wt 183.0 lb

## 2011-12-31 DIAGNOSIS — Z85038 Personal history of other malignant neoplasm of large intestine: Secondary | ICD-10-CM

## 2011-12-31 DIAGNOSIS — C189 Malignant neoplasm of colon, unspecified: Secondary | ICD-10-CM

## 2011-12-31 NOTE — Patient Instructions (Signed)
Restart coumadin - follow-up with Primary Care doc to monitor coumadin Will get CT scan of chest and abdomen to look to see if cancer has spread beyond colon  If not, then we will work on getting you scheduled for removal of the right part of your colon  Colorectal Cancer Colorectal cancer is an abnormal growth of tissue (tumor) in the colon or rectum that is cancerous (malignant). Unlike noncancerous (benign) tumors, malignant tumors can spread to other parts of your body. The colon is the large bowel or large intestine. The rectum is the last several inches of the colon. CAUSES  The exact cause of colon cancer is unknown.  RISK FACTORS The majority of patients do not have identifiable risk factors, but the following factors may increase your chances of getting colon cancer:  Age. Most colorectal cancers occur in people older than 50 years.  Having abnormal growths (polyps) on the inner wall of the colon or rectum.  Diabetes.  Being African American.  Family history of hereditary nonpolyposis colon cancer. This condition is caused by changes in the genes that are responsible for repairing mismatched DNA.  Family history of familial adenomatous polyposis (FAP). This is a rare, inherited condition in which hundreds of polyps form in the colon and rectum. It is caused by a change in the APC gene. Unless FAP is treated, it usually leads to colorectal cancer by age 36.  Personal history of cancer. A person who has already had colorectal cancer may develop it a second time. Also, women with a history of ovarian, uterine, or breast cancer are at a somewhat higher risk of developing colorectal cancer.  Inflammatory bowel disease, including ulcerative colitis and Crohn's disease.  Being obese or eating a diet that is high in fat (especially animal fat) and low in fiber, fruits, and vegetables.  Smoking. A person who smokes cigarettes may be at increased risk of developing polyps and colorectal  cancer.  Heavy alcohol use. SYMPTOMS Early colorectal cancer often does not cause symptoms. As the cancer grows, symptoms may include:  Diarrhea.  Constipation.  Feeling like the bowel does not empty completely after a bowel movement.  Blood in the stool.  Stools that are narrower than usual.  Abdominal discomfort, pain, bloating, fullness, or cramps.  Unexplained weight loss.  Constant tiredness.  Nausea and vomiting. DIAGNOSIS  Your caregiver will ask about your medical history. He or she may also perform a number of procedures, such as:  A physical exam.  Blood tests. This may include a routine complete blood count and iron level testing. Your caregiver may also check for carcinoembryonic antigen (CEA) and other substances in the blood. Some people who have colorectal cancer have a high CEA level. These levels may be used to follow the activity of your colon cancer.  Chest X-rays, computed tomography (CT) scans, or magnetic resonance imaging (MRI).  Taking a tissue sample (biopsy) from the colon or rectum. The sample is examined under a microscope to look for cancer cells.  Sigmoidoscopy. With this test, your caregiver can see inside your colon. A thin flexible tube (sigmoidoscope) is placed into your rectum. This device has a light source and a tiny video camera in it. Your caregiver uses the sigmoidoscope to look at the last third of your colon.  Colonoscopy. This test is like sigmoidoscopy, but your caregiver looks at the entire colon. This test usually requires medicine that helps you relax (sedative).  Endorectal ultrasound. With this test, your caregiver can see  how deep a rectal tumor has grown and whether the cancer has spread to lymph nodes or other nearby tissues. A tool (probe) is inserted into the rectum. The probe sends out sound waves to the rectum and nearby tissues, and a computer uses the echoes to create a picture. Your cancer will be staged to determine  its severity and extent. Staging is a careful attempt to find out the size of the tumor, whether the cancer has spread, and if so, to what parts of the body. You may need to have more tests to determine the stage of your cancer. The test results will help determine what treatment plan is best for you. STAGES  Stage 0. The cancer is found only in the innermost lining of the colon or rectum.  Stage I. The cancer has grown into the inner wall of the colon or rectum. The cancer has not yet reached the outer wall of the colon.  Stage II. The cancer extends more deeply into or through the wall of the colon or rectum. It may have invaded nearby tissue, but cancer cells have not spread to the lymph nodes.  Stage III. The cancer has spread to nearby lymph nodes but not to other parts of the body.  Stage IV. The cancer has spread to other parts of the body, such as the liver or lungs. Your caregiver may tell you the detailed stage of your cancer. In that case, the stage will include both a number and a letter. TREATMENT  Depending on the type and stage, colorectal cancer may be treated with surgery, radiation therapy, or chemotherapy. Some patients have a combination of these therapies.  Surgery may be done to remove the polyps from your colon. In early stages, your caregiver may be able to do this during a colonoscopy. In later stages, surgery may be done to remove part of your colon.  Radiation therapy uses high-energy rays to kill cancer cells. This is usually recommended for patients with rectal cancer.  Chemotherapy is the use of drugs to kill cancer cells. Caregivers also give chemotherapy to help reduce pain and other problems caused by colorectal cancer. This may be done even if the cancer is not curable. HOME CARE INSTRUCTIONS   Only take over-the-counter or prescription medicines for pain, discomfort, or fever as directed by your caregiver.  Maintain a healthy diet.  Consider joining a  support group. This may help you learn to cope with the stress of having colorectal cancer.  Seek advice to help you manage treatment side effects.  Keep all follow-up appointments as directed by your caregiver.  Inform your cancer specialist if you are admitted to the hospital. SEEK IMMEDIATE MEDICAL CARE IF:   Your diarrhea or constipation does not go away.  You have alternating constipation and diarrhea.  You have blood in your stools.  Your abdominal pain gets worse.  You lose weight without trying.  You notice new fatigue or weakness.  You develop a fever during chemotherapy treatment. Document Released: 03/02/2005 Document Revised: 05/25/2011 Document Reviewed: 02/17/2011 [facility] Patient Information 2013 [place], [place].

## 2012-01-01 ENCOUNTER — Telehealth (INDEPENDENT_AMBULATORY_CARE_PROVIDER_SITE_OTHER): Payer: Self-pay

## 2012-01-01 NOTE — Telephone Encounter (Signed)
The pt's son called because she was seen yesterday and DR [name] said for her to start her Coumadin back.  He told her he was going to call her primary md's office.  Their office hasn't heard anything from him.  Her name is Dr Paulene Floor [phone].  She needs her Coumadin reordered.  Please let the son know.

## 2012-01-01 NOTE — Telephone Encounter (Signed)
Spoke with Dr Ermalene Searing nurse. They had not yet received Dr Tawana Scale note stating it was okay for her to re-start coumadin until we can complete her testing and get surgery set up. She will get that re-started and check her levels. I called patient's son and he is aware.

## 2012-01-01 NOTE — Progress Notes (Signed)
Patient ID: [name], female   DOB: 06-02-1922, 78 y.o.   MRN: [id number]  Chief Complaint  Patient presents with  . Pre-op Exam    eval colon lesion    HPI [name] is a 78 y.o. female.   HPI 78 year old Caucasian female with a history of T3 N0 sigmoid colon cancer in January 2013 comes in because of the recent finding of a cecal lesion that was biopsied which showed adenocarcinoma. 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 At that time she had an incomplete colonoscopy because the lesion was obstructing. She had a very prolonged recovery phase. She actually unfortunately did not followup with me after her last visit in February. She saw her primary care physician recently who ordered routine lab work which demonstrated that the patient was anemic with a hemoglobin around 7. She was directly admitted to [facility] for workup. She underwent transfusion of 2 units of blood. She underwent a colonoscopy which demonstrated a 2 cm broad-based ulcerated lesion near the ileocecal valve. The biopsy was consistent with adenocarcinoma. Her CEA level is 10. She denies any abdominal pain, nausea, vomiting, diarrhea, constipation, melena or hematochezia. She does take milk of magnesia every night. She states that she has a very healthy appetite.  Past Medical History  Diagnosis Date  . Diastolic heart failure   . Ovarian cancer   . Hypertension   . Atrial fibrillation   . Tachy-brady syndrome 2008    s/p PPM implantation by Dr Amil Amen  . Colon cancer   . Arthritis   . Anemia     Past Surgical History  Procedure Date  . Abdominal hysterectomy   . Oophorectomy   . Colonoscopy 03/30/2011    Procedure: COLONOSCOPY;  Surgeon: Barrie Folk, MD;  Location: [facility] ENDOSCOPY;  Service: Endoscopy;  Laterality: N/A;  . Esophagogastroduodenoscopy 03/30/2011    Procedure: ESOPHAGOGASTRODUODENOSCOPY (EGD);  Surgeon: Barrie Folk, MD;  Location: [facility] ENDOSCOPY;   Service: Endoscopy;  Laterality: N/A;  . Colon resection 04/03/2011    Procedure: COLON RESECTION LAPAROSCOPIC;  Surgeon: Atilano Ina, MD;  Location: [facility] OR;  Service: General;  Laterality: N/A;  Laparoscopic, turned open sigmoid colon resection, lysis of adhesions x 2.5 hours, rigid proctoscopy.  . Pacemaker placement 2008    MDT EnRhythm implanted by Dr Amil Amen, generator change (MDT Adapta L) by Dr Johney Frame 08/04/11  . Colonoscopy 12/24/2011    Procedure: COLONOSCOPY;  Surgeon: Malissa Hippo, MD;  Location: [facility] ENDO SUITE;  Service: Endoscopy;  Laterality: N/A;    Family History  Problem Relation Age of Onset  . Anesthesia problems Neg Hx   . Hypotension Neg Hx   . Malignant hyperthermia Neg Hx   . Pseudochol deficiency Neg Hx   . Cancer Mother     ovarian    Social History History  Substance Use Topics  . Smoking status: Never Smoker   . Smokeless tobacco: Never Used  . Alcohol Use: No    Allergies  Allergen Reactions  . Lasix (Furosemide) Swelling  . Penicillins Nausea And Vomiting and Swelling    Lip swelling and redness  . Sulfa Antibiotics Rash    Current Outpatient Prescriptions  Medication Sig Dispense Refill  . albuterol (PROVENTIL HFA;VENTOLIN HFA) 108 (90 BASE) MCG/ACT inhaler Inhale 2 puffs into the lungs every 6 (six) hours as needed. For shortness of breath/wheezing      . bumetanide (BUMEX) 1 MG tablet Take  1 tablet (1 mg total) by mouth daily.  30 tablet  0  . calcium-vitamin D (OSCAL WITH D) 500-200 MG-UNIT per tablet Take 1 tablet by mouth 2 (two) times daily.      Marland Kitchen denosumab (PROLIA) 60 MG/ML SOLN Inject 60 mg into the skin every 6 (six) months.       . diltiazem (CARDIZEM CD) 240 MG 24 hr capsule Take 240 mg by mouth daily.      . feeding supplement (ENSURE IMMUNE HEALTH) LIQD Take 237 mLs by mouth daily.       . magnesium hydroxide (MILK OF MAGNESIA) 400 MG/5ML suspension Take 15 mLs by mouth at bedtime.      . pantoprazole (PROTONIX) 40 MG tablet  Take 40 mg by mouth daily.      . polysaccharide iron (NIFEREX) 150 MG CAPS capsule Take 150 mg by mouth every morning.       . potassium chloride SA (K-DUR,KLOR-CON) 20 MEQ tablet Take 1 tablet (20 mEq total) by mouth daily.  15 tablet  0  . ramipril (ALTACE) 10 MG capsule Take 10 mg by mouth daily.        . sotalol (BETAPACE) 160 MG tablet Take 1 tablet (160 mg total) by mouth 2 (two) times daily.  60 tablet  6  . DISCONTD: famotidine (PEPCID) 20 MG tablet Take 1 tablet (20 mg total) by mouth 2 (two) times daily.      Marland Kitchen DISCONTD: promethazine (PHENERGAN) 12.5 MG tablet Take 12.5 mg by mouth every 6 (six) hours as needed.        Review of Systems Review of Systems  Constitutional: Negative for fever, activity change, appetite change, fatigue and unexpected weight change.  HENT: Negative for hearing loss, nosebleeds and neck pain.   Eyes: Negative for photophobia and visual disturbance.  Respiratory: Negative for chest tightness, shortness of breath and wheezing.   Cardiovascular: Negative for chest pain and palpitations. Leg swelling: some ankle swelling occassionally.       Denies CP, SOB, orthopnea, PND, DOE  Gastrointestinal: Negative for nausea, vomiting, abdominal pain, diarrhea, constipation and blood in stool.  Genitourinary: Negative for dysuria and hematuria.  Musculoskeletal:       Walks with cane  Neurological: Negative for tremors, seizures, syncope, speech difficulty, weakness, light-headedness and headaches.       Denies TIAs,amaruosis fugax  Hematological: Negative for adenopathy. Does not bruise/bleed easily.  Psychiatric/Behavioral: Negative for suicidal ideas, behavioral problems and disturbed wake/sleep cycle.    Blood pressure 142/78, pulse 72, temperature 97.3 F (36.3 C), temperature source Temporal, resp. rate 16, height 5\' 4"  (1.626 m), weight 183 lb (83.008 kg).  Physical Exam Physical Exam  Constitutional: She is oriented to person, place, and time. She  appears well-developed and well-nourished. No distress.       Pleasant, well-groomed  HENT:  Head: Normocephalic and atraumatic.  Right Ear: External ear normal.  Left Ear: External ear normal.  Eyes: Conjunctivae normal are normal. Pupils are equal, round, and reactive to light. No scleral icterus.  Neck: Normal range of motion. Neck supple. No tracheal deviation present. No thyromegaly present.  Cardiovascular: Normal rate, regular rhythm and normal heart sounds.   Pulmonary/Chest: Effort normal and breath sounds normal. No stridor. No respiratory distress. She has no wheezes.    Abdominal: Soft. Normal appearance. There is no tenderness. There is no rigidity and no guarding. No hernia. Hernia confirmed negative in the ventral area.    Musculoskeletal: She exhibits no edema  and no tenderness.  Neurological: She is alert and oriented to person, place, and time. No cranial nerve deficit. She exhibits normal muscle tone.  Skin: Skin is warm and dry. No rash noted. She is not diaphoretic. No erythema.  Psychiatric: She has a normal mood and affect. Her behavior is normal. Judgment and thought content normal.    Data Reviewed My last office note Discharge summary from 03/2011 Echo results Spring 2013 - EF 30-35% systolic function reduced, moderate MR Dr Jenel Lucks procedure note from 07/2011 - pacemaker generator exchange Dr Jenel Lucks office note from 10/2011 Dr Patty Sermons colonoscopy note from 12/2011 Path report from 12/2011 [facility] Discharge Summary 12/2011   Assessment    New Cecal adenocarcinoma H/o T3N0 sigmoid colon cancer 03/2011 Anemia HTN A fib H/o ovarian cancer Tachy-bradycardia syndrome    Plan    Unfortunately it appears the patient has a new cecal adenocarcinoma. I informed the patient and her son of the biopsy results.  We discussed colon cancer.  We discussed the workup. I explained that she will need a CT scan of her chest, abdomen, and pelvis to look for signs  that the cancer has spread beyond the colon. If there is no evidence of metastatic disease, I recommended proceeding with a laparoscopic assisted right hemicolectomy. I explained that given her prior laparotomies and the extensive nature of her adhesive disease that a laparoscopic procedure may not be possible. However we would try to start laparoscopic and evaluate for any adhesive disease.  I discussed the procedure in detail.  The patient was given Agricultural engineer.  We discussed the risks and benefits of surgery including, but not limited to bleeding, infection (such as wound infection, abdominal abscess), injury to surrounding structures, blood clot formation, urinary retention, incisional hernia, anastomotic stricture, anastomotic leak, anesthesia risks, pulmonary & cardiac complications such as pneumonia &/or heart attack or death, need for additional procedures, ileus, & prolonged hospitalization.  We discussed the typical postoperative recovery course, including limitations & restrictions postoperatively. I explained that the likelihood of improvement in their symptoms is fair to good. I explained that she will probably need rehab or SNF after discharge from the hospital.   We have scheduled the patient for a CT scan of her chest, abdomen and pelvis.  Assuming that those CTs showed no evidence of metastatic disease, we will proceed with a laparoscopic assisted right hemicolectomy. The patient will need cardiac clearance prior to surgery. I told her in the interim she can resume her Coumadin. I encouraged her and her son to follow-up with her primary care physician for Coumadin monitoring in the interim  Her followup will be based on the results of her CT scans.  Aalivia Sella. [name], MD, FACS General, Bariatric, & Minimally Invasive Surgery [facility], [place]         [facility] M 01/01/2012, 2:08 PM

## 2012-01-04 ENCOUNTER — Ambulatory Visit (HOSPITAL_COMMUNITY)
Admission: RE | Admit: 2012-01-04 | Discharge: 2012-01-04 | Disposition: A | Discharge status: Home / Self Care | Payer: Medicare Other | Source: Ambulatory Visit | Attending: General Surgery | Admitting: General Surgery

## 2012-01-04 ENCOUNTER — Telehealth (INDEPENDENT_AMBULATORY_CARE_PROVIDER_SITE_OTHER): Payer: Self-pay | Admitting: General Surgery

## 2012-01-04 DIAGNOSIS — R933 Abnormal findings on diagnostic imaging of other parts of digestive tract: Secondary | ICD-10-CM | POA: Insufficient documentation

## 2012-01-04 DIAGNOSIS — R935 Abnormal findings on diagnostic imaging of other abdominal regions, including retroperitoneum: Secondary | ICD-10-CM | POA: Insufficient documentation

## 2012-01-04 DIAGNOSIS — C189 Malignant neoplasm of colon, unspecified: Secondary | ICD-10-CM | POA: Insufficient documentation

## 2012-01-04 MED ORDER — IOHEXOL 300 MG/ML  SOLN
100.0000 mL | Freq: Once | INTRAMUSCULAR | Status: AC | PRN
  Administered 2012-01-04: 100 mL via INTRAVENOUS

## 2012-01-04 NOTE — Telephone Encounter (Signed)
Message copied by Liliana Cline on Mon Jan 04, 2012  1:25 PM ------      Message from: [name], ERIC M      Created: Mon Jan 04, 2012 12:03 PM       pls call pt --ct showed evidence of cancer spread. Can proceed with getting pt lined up for surgery. Need to get cardiac clearance

## 2012-01-04 NOTE — Telephone Encounter (Signed)
CT was negative for metastatic disease. Called patient to make her aware. No answer. Also calling to let them know I have sent a request for clearance from Dr Johney Frame. Will try patient again later.

## 2012-01-06 NOTE — Telephone Encounter (Signed)
Spoke with Loraine Leriche, patient's son, who is the person to contact. He is aware CT did not show any additional areas of concern and aware we will call when we receive cardiac clearance to set up surgery.

## 2012-01-07 ENCOUNTER — Telehealth: Payer: Self-pay | Admitting: Internal Medicine

## 2012-01-07 NOTE — Telephone Encounter (Signed)
New problem:   Status of medical clearance. Did receive note by . However, it was sign by Dr. Eldridge Dace.

## 2012-01-08 NOTE — Telephone Encounter (Signed)
Spoke with Lesly Rubenstein and let her know this should come from his primary cardiologist  She is aware and will obtain from them

## 2012-01-11 ENCOUNTER — Other Ambulatory Visit (INDEPENDENT_AMBULATORY_CARE_PROVIDER_SITE_OTHER): Payer: Self-pay | Admitting: General Surgery

## 2012-01-11 ENCOUNTER — Encounter (INDEPENDENT_AMBULATORY_CARE_PROVIDER_SITE_OTHER): Payer: Self-pay

## 2012-01-14 ENCOUNTER — Telehealth (INDEPENDENT_AMBULATORY_CARE_PROVIDER_SITE_OTHER): Payer: Self-pay | Admitting: General Surgery

## 2012-01-14 NOTE — Telephone Encounter (Signed)
LMOM for patient's son Loraine Leriche to call me back. Need to make him aware we are sending bowel prep RX to pharmacy and need to give him/patient instructions for bowel prep.

## 2012-01-15 ENCOUNTER — Telehealth (INDEPENDENT_AMBULATORY_CARE_PROVIDER_SITE_OTHER): Payer: Self-pay | Admitting: General Surgery

## 2012-01-15 MED ORDER — PEG 3350-KCL-NABCB-NACL-NASULF 236 G PO SOLR: 4.0000 L | Freq: Once | ORAL | Status: DC

## 2012-01-15 NOTE — Addendum Note (Signed)
Addended byLiliana Cline on: 01/15/2012 09:12 AM   Modules accepted: Orders

## 2012-01-15 NOTE — Telephone Encounter (Signed)
Pt's son, Loraine Leriche, returned the call to [place].  Updated him on date surgery has been scheduled for in-patient at [facility] and to pick up the bowel prep.  Discussed in detail the [employer] prep and answered all his questions.

## 2012-01-20 ENCOUNTER — Telehealth (INDEPENDENT_AMBULATORY_CARE_PROVIDER_SITE_OTHER): Payer: Self-pay | Admitting: *Deleted

## 2012-01-20 NOTE — Telephone Encounter (Signed)
The patient's son , Jorja Loa, called and states he and his brother,Mark, are wondering if there is another option for their Mother instead of another surgery. He states that a recent Colonoscopy by Dr.Rehman another polyp was found, she has an appointment with Dr.Wood in [facility] for surgery. She underwent a Colon Surgery in December 2012 , 2 weeks post surgery she developed CHF , then Cellulitis , followed by a stay in [facility] where she per son caught C-Diff.  Jorja Loa says that their Mother is just bouncing back and just question if there is another option verses surgery for her. He is at work and he ask that we call [phone], and they will page him Dr.Rehman was paged.

## 2012-01-20 NOTE — Telephone Encounter (Signed)
Dr.Rehman made aware and he states that he is going to call Tim this afternoon or this evening. Tim was made aware. He ask that he be called on his cell number [phone] , and if before school is out call [phone] and have him paged.

## 2012-01-25 ENCOUNTER — Telehealth (INDEPENDENT_AMBULATORY_CARE_PROVIDER_SITE_OTHER): Payer: Self-pay | Admitting: General Surgery

## 2012-01-25 NOTE — Telephone Encounter (Signed)
Message copied by Liliana Cline on Mon Jan 25, 2012  3:44 PM ------      Message from: Docia Chuck      Created: Mon Jan 25, 2012  9:21 AM      Regarding: Pt  son wants a call       Pt son Jeanann Balinski [phone] had some questions about his mother's surgery and would like someone to call him.            Thanks

## 2012-01-25 NOTE — Telephone Encounter (Signed)
I have called and left message for him to try my cell number.

## 2012-01-26 NOTE — Telephone Encounter (Signed)
Called back with no answer x2. Voicemail "not set up yet". Unable to leave a message.

## 2012-01-27 ENCOUNTER — Telehealth (INDEPENDENT_AMBULATORY_CARE_PROVIDER_SITE_OTHER): Payer: Self-pay | Admitting: General Surgery

## 2012-01-27 NOTE — Telephone Encounter (Signed)
Tried to call patient again and no answer and no way to leave message after he called back and was transferred to our scheduling department and they asked me to call him again.

## 2012-01-27 NOTE — Telephone Encounter (Signed)
Tried to call [name] again today with no answer and no way to leave message.

## 2012-02-02 ENCOUNTER — Telehealth (INDEPENDENT_AMBULATORY_CARE_PROVIDER_SITE_OTHER): Payer: Self-pay | Admitting: General Surgery

## 2012-02-02 NOTE — Telephone Encounter (Signed)
Spoke with family member saying we have been trying to contact pt's son per his request. He is in school today. Asked family member to have Mr Tregre call me on Wed from 8;30-12:30

## 2012-02-04 ENCOUNTER — Telehealth (INDEPENDENT_AMBULATORY_CARE_PROVIDER_SITE_OTHER): Payer: Self-pay | Admitting: General Surgery

## 2012-02-04 NOTE — Telephone Encounter (Signed)
Loraine Leriche, pt's son, called to let Dr. [name] know the surgery for his mother will go as planned on 02/19/12.  Called [facility] pre-surgical screening to let them know to set up her appt again.

## 2012-02-10 ENCOUNTER — Encounter (HOSPITAL_COMMUNITY): Payer: Self-pay | Admitting: Pharmacy Technician

## 2012-02-16 ENCOUNTER — Telehealth (INDEPENDENT_AMBULATORY_CARE_PROVIDER_SITE_OTHER): Payer: Self-pay | Admitting: General Surgery

## 2012-02-16 NOTE — Telephone Encounter (Signed)
Called Dr. [name] to report pt is still taking her Coumadin, including this morning.  Her surgery is in three days.  Orders received to STOP COUMADIN and for STAT INR today and tomorrow; he will decide whether to proceed or not depending on her results.  Called son Loraine Leriche) with this and he will take her to cardiology office for INR now; will have them call [facility] with results.

## 2012-02-16 NOTE — Telephone Encounter (Signed)
Pt's INR called from her MD office:  3.5; paged and updated Dr. [name] with this result.

## 2012-02-16 NOTE — Telephone Encounter (Signed)
Pt's son called to go over the bowel prep and Coumadin.  Advised clear liquids on Wednesday before bowel prep on Thursday.  Hold Coumadin for 5 days before surgery.

## 2012-02-16 NOTE — Telephone Encounter (Signed)
Pt's PCP office called INR results:  3.5

## 2012-02-18 ENCOUNTER — Inpatient Hospital Stay (HOSPITAL_COMMUNITY): Admission: RE | Admit: 2012-02-18 | Payer: Medicare Other | Source: Ambulatory Visit

## 2012-02-18 ENCOUNTER — Encounter (INDEPENDENT_AMBULATORY_CARE_PROVIDER_SITE_OTHER): Payer: Self-pay

## 2012-02-18 ENCOUNTER — Encounter (HOSPITAL_COMMUNITY): Payer: Self-pay | Admitting: *Deleted

## 2012-02-18 ENCOUNTER — Other Ambulatory Visit (INDEPENDENT_AMBULATORY_CARE_PROVIDER_SITE_OTHER): Payer: Self-pay | Admitting: General Surgery

## 2012-02-18 ENCOUNTER — Telehealth (INDEPENDENT_AMBULATORY_CARE_PROVIDER_SITE_OTHER): Payer: Self-pay | Admitting: General Surgery

## 2012-02-18 ENCOUNTER — Other Ambulatory Visit (HOSPITAL_COMMUNITY): Payer: Medicare Other

## 2012-02-18 MED ORDER — CIPROFLOXACIN IN D5W 400 MG/200ML IV SOLN
400.0000 mg | INTRAVENOUS | Status: AC
  Administered 2012-02-19: 400 mg via INTRAVENOUS
  Filled 2012-02-18: qty 200

## 2012-02-18 MED ORDER — CHLORHEXIDINE GLUCONATE 4 % EX LIQD: 1.0000 "application " | Freq: Once | CUTANEOUS | Status: DC

## 2012-02-18 MED ORDER — METRONIDAZOLE IN NACL 5-0.79 MG/ML-% IV SOLN
500.0000 mg | INTRAVENOUS | Status: AC
  Administered 2012-02-19: .5 g via INTRAVENOUS
  Filled 2012-02-18: qty 100

## 2012-02-18 MED ORDER — ALVIMOPAN 12 MG PO CAPS
12.0000 mg | ORAL_CAPSULE | Freq: Once | ORAL | Status: AC
  Administered 2012-02-19: 12 mg via ORAL
  Filled 2012-02-18: qty 1

## 2012-02-18 MED ORDER — HEPARIN SODIUM (PORCINE) 5000 UNIT/ML IJ SOLN
5000.0000 [IU] | Freq: Once | INTRAMUSCULAR | Status: AC
  Administered 2012-02-19: 5000 [IU] via SUBCUTANEOUS
  Filled 2012-02-18: qty 1

## 2012-02-18 NOTE — Telephone Encounter (Signed)
Patient's INR today is 2.0. Per Dr [name] to proceed with surgery. Patient to have preop labs and repeat INR in the am. She is to arrive at 7:00 am. Loraine Leriche, patient's son, made aware. Patient made aware to start bowel prep. Family is aware if INR is too high tomorrow morning surgery may be cancelled. They will proceed with surgery tomorrow.

## 2012-02-18 NOTE — Telephone Encounter (Signed)
Patient's INR yesterday was at 3.2. Patient is going this am to have another INR drawn. Dr Moore's office to call with results. Based on results her surgery may need to be cancelled. Spoke with Mrs Mcglinn and made her aware to not start bowel prep until I call her with an INR result. She understands.

## 2012-02-18 NOTE — Progress Notes (Addendum)
Cardiologist is with [facility]-Dr.Varanasi--last visit was within the past yr;also saw Dr.Allred in [place] about 2-9months ago  Stress test in epic from 2005 Echo results in epic from 2005/2013 Denies ever having a heart cath  [facility]    EKG in epic from 12/22/11 CXR in epic from 06/09/11

## 2012-02-19 ENCOUNTER — Encounter (HOSPITAL_COMMUNITY): Payer: Self-pay | Admitting: Anesthesiology

## 2012-02-19 ENCOUNTER — Inpatient Hospital Stay (HOSPITAL_COMMUNITY)
Admission: RE | Admit: 2012-02-19 | Discharge: 2012-02-25 | DRG: 330 | Disposition: A | Discharge status: Skilled Nursing Facility | Payer: Medicare Other | Source: Ambulatory Visit | Attending: General Surgery | Admitting: General Surgery

## 2012-02-19 ENCOUNTER — Inpatient Hospital Stay (HOSPITAL_COMMUNITY): Payer: Medicare Other | Admitting: Anesthesiology

## 2012-02-19 ENCOUNTER — Encounter (HOSPITAL_COMMUNITY): Payer: Self-pay | Admitting: *Deleted

## 2012-02-19 ENCOUNTER — Encounter (HOSPITAL_COMMUNITY)
Admission: RE | Disposition: A | Discharge status: Skilled Nursing Facility | Payer: Self-pay | Source: Ambulatory Visit | Attending: General Surgery

## 2012-02-19 DIAGNOSIS — D630 Anemia in neoplastic disease: Secondary | ICD-10-CM | POA: Diagnosis present

## 2012-02-19 DIAGNOSIS — Z79899 Other long term (current) drug therapy: Secondary | ICD-10-CM

## 2012-02-19 DIAGNOSIS — Z950 Presence of cardiac pacemaker: Secondary | ICD-10-CM | POA: Diagnosis present

## 2012-02-19 DIAGNOSIS — K589 Irritable bowel syndrome without diarrhea: Secondary | ICD-10-CM | POA: Diagnosis present

## 2012-02-19 DIAGNOSIS — D649 Anemia, unspecified: Secondary | ICD-10-CM | POA: Diagnosis present

## 2012-02-19 DIAGNOSIS — C189 Malignant neoplasm of colon, unspecified: Secondary | ICD-10-CM

## 2012-02-19 DIAGNOSIS — Z7901 Long term (current) use of anticoagulants: Secondary | ICD-10-CM

## 2012-02-19 DIAGNOSIS — Z888 Allergy status to other drugs, medicaments and biological substances status: Secondary | ICD-10-CM

## 2012-02-19 DIAGNOSIS — I10 Essential (primary) hypertension: Secondary | ICD-10-CM

## 2012-02-19 DIAGNOSIS — Z882 Allergy status to sulfonamides status: Secondary | ICD-10-CM

## 2012-02-19 DIAGNOSIS — K660 Peritoneal adhesions (postprocedural) (postinfection): Secondary | ICD-10-CM | POA: Diagnosis present

## 2012-02-19 DIAGNOSIS — K219 Gastro-esophageal reflux disease without esophagitis: Secondary | ICD-10-CM | POA: Diagnosis present

## 2012-02-19 DIAGNOSIS — D62 Acute posthemorrhagic anemia: Secondary | ICD-10-CM | POA: Diagnosis not present

## 2012-02-19 DIAGNOSIS — I4891 Unspecified atrial fibrillation: Secondary | ICD-10-CM | POA: Diagnosis present

## 2012-02-19 DIAGNOSIS — I4892 Unspecified atrial flutter: Secondary | ICD-10-CM | POA: Diagnosis present

## 2012-02-19 DIAGNOSIS — K6389 Other specified diseases of intestine: Secondary | ICD-10-CM

## 2012-02-19 DIAGNOSIS — Z5331 Laparoscopic surgical procedure converted to open procedure: Secondary | ICD-10-CM

## 2012-02-19 DIAGNOSIS — I495 Sick sinus syndrome: Secondary | ICD-10-CM | POA: Diagnosis present

## 2012-02-19 DIAGNOSIS — C182 Malignant neoplasm of ascending colon: Principal | ICD-10-CM | POA: Diagnosis present

## 2012-02-19 DIAGNOSIS — Z8543 Personal history of malignant neoplasm of ovary: Secondary | ICD-10-CM

## 2012-02-19 DIAGNOSIS — Z85038 Personal history of other malignant neoplasm of large intestine: Secondary | ICD-10-CM

## 2012-02-19 DIAGNOSIS — Z880 Allergy status to penicillin: Secondary | ICD-10-CM

## 2012-02-19 DIAGNOSIS — M810 Age-related osteoporosis without current pathological fracture: Secondary | ICD-10-CM | POA: Diagnosis present

## 2012-02-19 HISTORY — DX: Constipation, unspecified: K59.00

## 2012-02-19 HISTORY — DX: Age-related osteoporosis without current pathological fracture: M81.0

## 2012-02-19 HISTORY — DX: Personal history of other infectious and parasitic diseases: Z86.19

## 2012-02-19 HISTORY — DX: Spontaneous ecchymoses: R23.3

## 2012-02-19 HISTORY — PX: LAPAROSCOPIC RIGHT HEMI COLECTOMY: SHX5926

## 2012-02-19 HISTORY — DX: Other skin changes: R23.8

## 2012-02-19 HISTORY — PX: LYSIS OF ADHESION: SHX5961

## 2012-02-19 HISTORY — PX: PARTIAL COLECTOMY: SHX5273

## 2012-02-19 HISTORY — DX: Gastro-esophageal reflux disease without esophagitis: K21.9

## 2012-02-19 HISTORY — DX: Personal history of diseases of the skin and subcutaneous tissue: Z87.2

## 2012-02-19 LAB — CBC WITH DIFFERENTIAL/PLATELET
Basophils Absolute: 0 10*3/uL (ref 0.0–0.1)
Eosinophils Absolute: 0.1 10*3/uL (ref 0.0–0.7)
Hemoglobin: 9.3 g/dL — ABNORMAL LOW (ref 12.0–15.0)
Lymphocytes Relative: 17 % (ref 12–46)
MCH: 21.9 pg — ABNORMAL LOW (ref 26.0–34.0)
MCHC: 30 g/dL (ref 30.0–36.0)
Monocytes Absolute: 0.6 10*3/uL (ref 0.1–1.0)
Neutrophils Relative %: 74 % (ref 43–77)
Platelets: 298 10*3/uL (ref 150–400)
RDW: 20.4 % — ABNORMAL HIGH (ref 11.5–15.5)

## 2012-02-19 LAB — PROTIME-INR
INR: 1.68 — ABNORMAL HIGH (ref 0.00–1.49)
Prothrombin Time: 19.2 seconds — ABNORMAL HIGH (ref 11.6–15.2)

## 2012-02-19 LAB — COMPREHENSIVE METABOLIC PANEL
ALT: 13 U/L (ref 0–35)
Albumin: 3.6 g/dL (ref 3.5–5.2)
Alkaline Phosphatase: 51 U/L (ref 39–117)
Calcium: 9.1 mg/dL (ref 8.4–10.5)
GFR calc Af Amer: 48 mL/min — ABNORMAL LOW (ref >90)
Glucose, Bld: 126 mg/dL — ABNORMAL HIGH (ref 70–99)
Potassium: 4.2 mEq/L (ref 3.5–5.1)
Sodium: 140 mEq/L (ref 135–145)
Total Protein: 7.2 g/dL (ref 6.0–8.3)

## 2012-02-19 LAB — SURGICAL PCR SCREEN: MRSA, PCR: NEGATIVE

## 2012-02-19 LAB — APTT: aPTT: 28 seconds (ref 24–37)

## 2012-02-19 SURGERY — LAPAROSCOPIC RIGHT HEMI COLECTOMY
Anesthesia: General | Site: Abdomen | Laterality: Right | Wound class: Clean Contaminated

## 2012-02-19 MED ORDER — MUPIROCIN 2 % EX OINT
TOPICAL_OINTMENT | CUTANEOUS | Status: AC
  Administered 2012-02-19: 1 via NASAL
  Filled 2012-02-19: qty 22

## 2012-02-19 MED ORDER — DEXTROSE 5 % IV SOLN
INTRAVENOUS | Status: DC | PRN
  Administered 2012-02-19: 10:00:00 via INTRAVENOUS

## 2012-02-19 MED ORDER — ONDANSETRON HCL 4 MG/2ML IJ SOLN
4.0000 mg | Freq: Four times a day (QID) | INTRAMUSCULAR | Status: DC | PRN
  Administered 2012-02-19: 4 mg via INTRAVENOUS

## 2012-02-19 MED ORDER — ACETAMINOPHEN 10 MG/ML IV SOLN
INTRAVENOUS | Status: AC
  Administered 2012-02-19: 1000 mg via INTRAVENOUS
  Filled 2012-02-19: qty 100

## 2012-02-19 MED ORDER — ACETAMINOPHEN 10 MG/ML IV SOLN
INTRAVENOUS | Status: AC
  Filled 2012-02-19: qty 100

## 2012-02-19 MED ORDER — LIDOCAINE HCL (CARDIAC) 20 MG/ML IV SOLN
INTRAVENOUS | Status: DC | PRN
  Administered 2012-02-19: 20 mg via INTRAVENOUS

## 2012-02-19 MED ORDER — BUPIVACAINE-EPINEPHRINE 0.25% -1:200000 IJ SOLN
INTRAMUSCULAR | Status: DC | PRN
  Administered 2012-02-19: 20 mL

## 2012-02-19 MED ORDER — ONDANSETRON HCL 4 MG PO TABS: 4.0000 mg | ORAL_TABLET | Freq: Four times a day (QID) | ORAL | Status: DC | PRN

## 2012-02-19 MED ORDER — PROMETHAZINE HCL 25 MG/ML IJ SOLN
12.5000 mg | Freq: Four times a day (QID) | INTRAMUSCULAR | Status: DC | PRN
  Administered 2012-02-19: 12.5 mg via INTRAVENOUS
  Filled 2012-02-19: qty 1

## 2012-02-19 MED ORDER — LACTATED RINGERS IV SOLN
INTRAVENOUS | Status: DC | PRN
  Administered 2012-02-19: 10:00:00 via INTRAVENOUS

## 2012-02-19 MED ORDER — MORPHINE SULFATE 2 MG/ML IJ SOLN
1.0000 mg | INTRAMUSCULAR | Status: DC | PRN
  Administered 2012-02-19 – 2012-02-21 (×3): 2 mg via INTRAVENOUS
  Filled 2012-02-19 (×3): qty 1

## 2012-02-19 MED ORDER — ACETAMINOPHEN 10 MG/ML IV SOLN
1000.0000 mg | Freq: Four times a day (QID) | INTRAVENOUS | Status: AC
  Administered 2012-02-19 – 2012-02-20 (×4): 1000 mg via INTRAVENOUS
  Filled 2012-02-19 (×4): qty 100

## 2012-02-19 MED ORDER — BUPIVACAINE ON-Q PAIN PUMP (FOR ORDER SET NO CHG)
INJECTION | Status: DC
  Filled 2012-02-19: qty 1

## 2012-02-19 MED ORDER — BUPIVACAINE 0.25 % ON-Q PUMP DUAL CATH 300 ML
300.0000 mL | INJECTION | Status: DC
  Filled 2012-02-19 (×2): qty 300

## 2012-02-19 MED ORDER — MUPIROCIN 2 % EX OINT
TOPICAL_OINTMENT | Freq: Two times a day (BID) | CUTANEOUS | Status: DC
  Administered 2012-02-19: 1 via NASAL
  Administered 2012-02-20 – 2012-02-23 (×5): via NASAL
  Filled 2012-02-19: qty 22

## 2012-02-19 MED ORDER — ROCURONIUM BROMIDE 100 MG/10ML IV SOLN
INTRAVENOUS | Status: DC | PRN
  Administered 2012-02-19: 10 mg via INTRAVENOUS
  Administered 2012-02-19: 40 mg via INTRAVENOUS

## 2012-02-19 MED ORDER — FENTANYL CITRATE 0.05 MG/ML IJ SOLN
INTRAMUSCULAR | Status: AC
  Filled 2012-02-19: qty 2

## 2012-02-19 MED ORDER — 0.9 % SODIUM CHLORIDE (POUR BTL) OPTIME
TOPICAL | Status: DC | PRN
  Administered 2012-02-19 (×2): 1000 mL

## 2012-02-19 MED ORDER — ACETAMINOPHEN 10 MG/ML IV SOLN
1000.0000 mg | Freq: Once | INTRAVENOUS | Status: DC | PRN
  Administered 2012-02-19: 1000 mg via INTRAVENOUS

## 2012-02-19 MED ORDER — DILTIAZEM HCL ER COATED BEADS 240 MG PO CP24
240.0000 mg | ORAL_CAPSULE | Freq: Every day | ORAL | Status: DC
  Administered 2012-02-21 – 2012-02-24 (×4): 240 mg via ORAL
  Filled 2012-02-19 (×6): qty 1

## 2012-02-19 MED ORDER — GLYCOPYRROLATE 0.2 MG/ML IJ SOLN
INTRAMUSCULAR | Status: DC | PRN
  Administered 2012-02-19: .5 mg via INTRAVENOUS

## 2012-02-19 MED ORDER — HYDROMORPHONE HCL PF 1 MG/ML IJ SOLN: 0.2500 mg | INTRAMUSCULAR | Status: DC | PRN

## 2012-02-19 MED ORDER — ALVIMOPAN 12 MG PO CAPS
12.0000 mg | ORAL_CAPSULE | Freq: Two times a day (BID) | ORAL | Status: DC
  Administered 2012-02-20 – 2012-02-23 (×7): 12 mg via ORAL
  Filled 2012-02-19 (×8): qty 1

## 2012-02-19 MED ORDER — ALBUTEROL SULFATE HFA 108 (90 BASE) MCG/ACT IN AERS: 2.0000 | INHALATION_SPRAY | Freq: Four times a day (QID) | RESPIRATORY_TRACT | Status: DC | PRN

## 2012-02-19 MED ORDER — BUMETANIDE 1 MG PO TABS
1.0000 mg | ORAL_TABLET | Freq: Every day | ORAL | Status: DC
  Administered 2012-02-21 – 2012-02-24 (×4): 1 mg via ORAL
  Filled 2012-02-19 (×6): qty 1

## 2012-02-19 MED ORDER — PHENYLEPHRINE HCL 10 MG/ML IJ SOLN
INTRAMUSCULAR | Status: DC | PRN
  Administered 2012-02-19 (×3): 40 ug via INTRAVENOUS

## 2012-02-19 MED ORDER — ARTIFICIAL TEARS OP OINT
TOPICAL_OINTMENT | OPHTHALMIC | Status: DC | PRN
  Administered 2012-02-19: 1 via OPHTHALMIC

## 2012-02-19 MED ORDER — ENOXAPARIN SODIUM 30 MG/0.3ML ~~LOC~~ SOLN
30.0000 mg | SUBCUTANEOUS | Status: DC
  Administered 2012-02-20 – 2012-02-21 (×2): 30 mg via SUBCUTANEOUS
  Filled 2012-02-19 (×3): qty 0.3

## 2012-02-19 MED ORDER — FENTANYL CITRATE 0.05 MG/ML IJ SOLN
INTRAMUSCULAR | Status: DC | PRN
  Administered 2012-02-19: 25 ug via INTRAVENOUS
  Administered 2012-02-19: 100 ug via INTRAVENOUS

## 2012-02-19 MED ORDER — NEOSTIGMINE METHYLSULFATE 1 MG/ML IJ SOLN
INTRAMUSCULAR | Status: DC | PRN
  Administered 2012-02-19: 4 mg via INTRAVENOUS

## 2012-02-19 MED ORDER — SOTALOL HCL 80 MG PO TABS
160.0000 mg | ORAL_TABLET | Freq: Two times a day (BID) | ORAL | Status: DC
  Administered 2012-02-19 – 2012-02-24 (×8): 160 mg via ORAL
  Filled 2012-02-19 (×13): qty 2

## 2012-02-19 MED ORDER — ONDANSETRON HCL 4 MG/2ML IJ SOLN
INTRAMUSCULAR | Status: DC | PRN
  Administered 2012-02-19: 4 mg via INTRAVENOUS

## 2012-02-19 MED ORDER — SODIUM CHLORIDE 0.9 % IR SOLN
Status: DC | PRN
  Administered 2012-02-19: 1000 mL

## 2012-02-19 MED ORDER — KCL IN DEXTROSE-NACL 20-5-0.45 MEQ/L-%-% IV SOLN
INTRAVENOUS | Status: DC
  Administered 2012-02-19: 100 mL/h via INTRAVENOUS
  Administered 2012-02-20: 1000 mL via INTRAVENOUS
  Administered 2012-02-21: 400 mL via INTRAVENOUS
  Administered 2012-02-23: 10 mL/h via INTRAVENOUS
  Filled 2012-02-19 (×10): qty 1000

## 2012-02-19 MED ORDER — BUPIVACAINE-EPINEPHRINE PF 0.25-1:200000 % IJ SOLN
INTRAMUSCULAR | Status: AC
  Filled 2012-02-19: qty 30

## 2012-02-19 MED ORDER — ACETAMINOPHEN 10 MG/ML IV SOLN
INTRAVENOUS | Status: DC | PRN
  Administered 2012-02-19: 1000 mg via INTRAVENOUS

## 2012-02-19 MED ORDER — PANTOPRAZOLE SODIUM 40 MG IV SOLR
40.0000 mg | INTRAVENOUS | Status: DC
  Administered 2012-02-19 – 2012-02-22 (×4): 40 mg via INTRAVENOUS
  Filled 2012-02-19 (×6): qty 40

## 2012-02-19 MED ORDER — SODIUM CHLORIDE 0.9 % IJ SOLN
INTRAMUSCULAR | Status: AC
  Administered 2012-02-19: 10 mL
  Filled 2012-02-19: qty 10

## 2012-02-19 MED ORDER — LACTATED RINGERS IV SOLN: INTRAVENOUS | Status: DC

## 2012-02-19 MED ORDER — PROPOFOL 10 MG/ML IV BOLUS
INTRAVENOUS | Status: DC | PRN
  Administered 2012-02-19: 20 mg via INTRAVENOUS
  Administered 2012-02-19: 80 mg via INTRAVENOUS

## 2012-02-19 MED ORDER — ONDANSETRON HCL 4 MG/2ML IJ SOLN: 4.0000 mg | Freq: Once | INTRAMUSCULAR | Status: DC | PRN

## 2012-02-19 MED ORDER — RAMIPRIL 10 MG PO CAPS
10.0000 mg | ORAL_CAPSULE | Freq: Every day | ORAL | Status: DC
  Administered 2012-02-23 – 2012-02-24 (×2): 10 mg via ORAL
  Filled 2012-02-19 (×7): qty 1

## 2012-02-19 MED ORDER — MIDAZOLAM HCL 2 MG/2ML IJ SOLN
INTRAMUSCULAR | Status: AC
  Filled 2012-02-19: qty 2

## 2012-02-19 MED ORDER — ONDANSETRON HCL 4 MG/2ML IJ SOLN
INTRAMUSCULAR | Status: AC
  Administered 2012-02-19: 4 mg via INTRAVENOUS
  Filled 2012-02-19: qty 2

## 2012-02-19 SURGICAL SUPPLY — 84 items
APPLIER CLIP ROT 10 11.4 M/L (STAPLE)
BLADE SURG 10 STRL SS (BLADE) ×3 IMPLANT
BLADE SURG ROTATE 9660 (MISCELLANEOUS) IMPLANT
CANISTER SUCTION 2500CC (MISCELLANEOUS) ×3 IMPLANT
CATH KIT ON Q 5IN SLV (PAIN MANAGEMENT) ×6 IMPLANT
CATH KIT ON Q 7.5IN SLV (PAIN MANAGEMENT) IMPLANT
CELLS DAT CNTRL 66122 CELL SVR (MISCELLANEOUS) IMPLANT
CHLORAPREP W/TINT 26ML (MISCELLANEOUS) ×3 IMPLANT
CLIP APPLIE ROT 10 11.4 M/L (STAPLE) IMPLANT
CLOTH BEACON ORANGE TIMEOUT ST (SAFETY) ×3 IMPLANT
COVER SURGICAL LIGHT HANDLE (MISCELLANEOUS) ×3 IMPLANT
DECANTER SPIKE VIAL GLASS SM (MISCELLANEOUS) ×6 IMPLANT
DISSECTOR BLUNT TIP ENDO 5MM (MISCELLANEOUS) IMPLANT
DRAPE PROXIMA HALF (DRAPES) IMPLANT
DRAPE UTILITY 15X26 W/TAPE STR (DRAPE) ×6 IMPLANT
DRAPE WARM FLUID 44X44 (DRAPE) ×3 IMPLANT
DRESSING TELFA 8X3 (GAUZE/BANDAGES/DRESSINGS) ×3 IMPLANT
ELECT CAUTERY BLADE 6.4 (BLADE) ×6 IMPLANT
ELECT REM PT RETURN 9FT ADLT (ELECTROSURGICAL) ×3
ELECTRODE REM PT RTRN 9FT ADLT (ELECTROSURGICAL) ×2 IMPLANT
GEL ULTRASOUND 20GR AQUASONIC (MISCELLANEOUS) IMPLANT
GLOVE BIO SURGEON STRL SZ 6 (GLOVE) IMPLANT
GLOVE BIO SURGEON STRL SZ7.5 (GLOVE) ×18 IMPLANT
GLOVE BIOGEL M STRL SZ7.5 (GLOVE) ×6 IMPLANT
GLOVE BIOGEL PI IND STRL 6.5 (GLOVE) ×4 IMPLANT
GLOVE BIOGEL PI IND STRL 7.5 (GLOVE) ×4 IMPLANT
GLOVE BIOGEL PI IND STRL 8 (GLOVE) ×2 IMPLANT
GLOVE BIOGEL PI INDICATOR 6.5 (GLOVE) ×2
GLOVE BIOGEL PI INDICATOR 7.5 (GLOVE) ×2
GLOVE BIOGEL PI INDICATOR 8 (GLOVE) ×1
GLOVE ECLIPSE 6.5 STRL STRAW (GLOVE) ×6 IMPLANT
GOWN PREVENTION PLUS XLARGE (GOWN DISPOSABLE) ×3 IMPLANT
GOWN STRL NON-REIN LRG LVL3 (GOWN DISPOSABLE) ×15 IMPLANT
KIT BASIN OR (CUSTOM PROCEDURE TRAY) ×3 IMPLANT
KIT ROOM TURNOVER OR (KITS) ×3 IMPLANT
LEGGING LITHOTOMY PAIR STRL (DRAPES) IMPLANT
LIGASURE 5MM LAPAROSCOPIC (INSTRUMENTS) IMPLANT
LIGASURE IMPACT 36 18CM CVD LR (INSTRUMENTS) ×3 IMPLANT
NS IRRIG 1000ML POUR BTL (IV SOLUTION) ×6 IMPLANT
PAD ARMBOARD 7.5X6 YLW CONV (MISCELLANEOUS) ×6 IMPLANT
PENCIL BUTTON HOLSTER BLD 10FT (ELECTRODE) ×3 IMPLANT
RELOAD PROXIMATE 75MM BLUE (ENDOMECHANICALS) ×3 IMPLANT
RTRCTR WOUND ALEXIS 18CM MED (MISCELLANEOUS)
SCALPEL HARMONIC ACE (MISCELLANEOUS) IMPLANT
SCISSORS LAP 5X35 DISP (ENDOMECHANICALS) IMPLANT
SET IRRIG TUBING LAPAROSCOPIC (IRRIGATION / IRRIGATOR) ×3 IMPLANT
SLEEVE ENDOPATH XCEL 5M (ENDOMECHANICALS) ×3 IMPLANT
SPECIMEN JAR LARGE (MISCELLANEOUS) ×3 IMPLANT
SPONGE GAUZE 4X4 12PLY (GAUZE/BANDAGES/DRESSINGS) ×3 IMPLANT
SPONGE INTESTINAL PEANUT (DISPOSABLE) ×3 IMPLANT
SPONGE LAP 18X18 X RAY DECT (DISPOSABLE) ×3 IMPLANT
STAPLER GUN LINEAR PROX 60 (STAPLE) ×3 IMPLANT
STAPLER PROXIMATE 75MM BLUE (STAPLE) ×6 IMPLANT
STAPLER VISISTAT 35W (STAPLE) ×3 IMPLANT
SUCTION POOLE TIP (SUCTIONS) ×3 IMPLANT
SURGILUBE 2OZ TUBE FLIPTOP (MISCELLANEOUS) IMPLANT
SUT PDS AB 1 CT  36 (SUTURE)
SUT PDS AB 1 CT 36 (SUTURE) IMPLANT
SUT PDS AB 1 TP1 96 (SUTURE) ×6 IMPLANT
SUT PROLENE 2 0 CT2 30 (SUTURE) IMPLANT
SUT PROLENE 2 0 KS (SUTURE) IMPLANT
SUT SILK 2 0 (SUTURE) ×1
SUT SILK 2 0 SH CR/8 (SUTURE) ×6 IMPLANT
SUT SILK 2-0 18XBRD TIE 12 (SUTURE) ×2 IMPLANT
SUT SILK 3 0 (SUTURE) ×1
SUT SILK 3 0 SH CR/8 (SUTURE) ×6 IMPLANT
SUT SILK 3-0 18XBRD TIE 12 (SUTURE) ×2 IMPLANT
SYS LAPSCP GELPORT 120MM (MISCELLANEOUS)
SYSTEM LAPSCP GELPORT 120MM (MISCELLANEOUS) IMPLANT
TAPE CLOTH SURG 4X10 WHT LF (GAUZE/BANDAGES/DRESSINGS) ×3 IMPLANT
TOWEL OR 17X24 6PK STRL BLUE (TOWEL DISPOSABLE) ×3 IMPLANT
TOWEL OR 17X26 10 PK STRL BLUE (TOWEL DISPOSABLE) ×3 IMPLANT
TRAY FOLEY CATH 14FRSI W/METER (CATHETERS) ×3 IMPLANT
TRAY LAPAROSCOPIC (CUSTOM PROCEDURE TRAY) ×3 IMPLANT
TRAY PROCTOSCOPIC FIBER OPTIC (SET/KITS/TRAYS/PACK) IMPLANT
TROCAR XCEL 12X100 BLDLESS (ENDOMECHANICALS) IMPLANT
TROCAR XCEL BLUNT TIP 100MML (ENDOMECHANICALS) ×3 IMPLANT
TROCAR XCEL NON-BLD 11X100MML (ENDOMECHANICALS) IMPLANT
TROCAR XCEL NON-BLD 5MMX100MML (ENDOMECHANICALS) ×3 IMPLANT
TUBE CONNECTING 12X1/4 (SUCTIONS) ×3 IMPLANT
TUBING FILTER THERMOFLATOR (ELECTROSURGICAL) ×3 IMPLANT
TUNNELER SHEATH ON-Q 16GX12 DP (PAIN MANAGEMENT) ×3 IMPLANT
WATER STERILE IRR 1000ML POUR (IV SOLUTION) IMPLANT
YANKAUER SUCT BULB TIP NO VENT (SUCTIONS) ×6 IMPLANT

## 2012-02-19 NOTE — H&P (Signed)
[name] is an 78 y.o. female.   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 At that time she had an incomplete colonoscopy because the lesion was obstructing. She had a very prolonged recovery phase. She actually unfortunately did not followup with me after her last visit in February. She saw her primary care physician recently who ordered routine lab work which demonstrated that the patient was anemic with a hemoglobin around 7. She was directly admitted to [facility] for workup. She underwent transfusion of 2 units of blood. She underwent a colonoscopy which demonstrated a 2 cm broad-based ulcerated lesion near the ileocecal valve. The biopsy was consistent with adenocarcinoma. Her CEA level is 10. She denies any abdominal pain, nausea, vomiting, diarrhea, constipation, melena or hematochezia. She does take milk of magnesia every night. She states that she has a very healthy appetite.  Pt's last dose of coumadin was Tuesday evening. She forgot to stop taking it sooner. She has had daily INRs since then. It was 3.2 wed and 2 yesterday. She did her bowel prep yesterday.   Past Medical History  Diagnosis Date  . Diastolic heart failure   . Ovarian cancer   . Colon cancer   . Arthritis   . History of Clostridium difficile infection     in Jan 2013  . Pacemaker   . History of cellulitis and abscess   . Hypertension     takes Ramipril,Diltiazem daily  . Atrial fibrillation     takes Coumadin daily  . Tachy-brady syndrome 2008    s/p PPM implantation by Dr Gifford Shave takes Betapace daily  . Osteoporosis     takes Prolia every 6months injections  . Shortness of breath     with exertion;Albuterol prn  . Bruises  easily     takes COumadin daily  . GERD (gastroesophageal reflux disease)     takes Protonix daily  . Constipation     Milk of Mag nightly  . Anemia     takes Iron daily    Past Surgical History  Procedure Date  . Abdominal hysterectomy   . Oophorectomy   . Colonoscopy 03/30/2011    Procedure: COLONOSCOPY;  Surgeon: Barrie Folk, MD;  Location: [facility] ENDOSCOPY;  Service: Endoscopy;  Laterality: N/A;  . Esophagogastroduodenoscopy 03/30/2011    Procedure: ESOPHAGOGASTRODUODENOSCOPY (EGD);  Surgeon: Barrie Folk, MD;  Location: [facility] ENDOSCOPY;  Service: Endoscopy;  Laterality: N/A;  . Colon resection 04/03/2011    Procedure: COLON RESECTION LAPAROSCOPIC;  Surgeon: Atilano Ina, MD;  Location: [facility] OR;  Service: General;  Laterality: N/A;  Laparoscopic, turned open sigmoid colon resection, lysis of adhesions x 2.5 hours, rigid proctoscopy.  . Pacemaker placement 2008    MDT EnRhythm implanted by Dr Amil Amen, generator change (MDT Adapta L) by Dr Johney Frame 08/04/11  . Colonoscopy 12/24/2011    Procedure: COLONOSCOPY;  Surgeon: Malissa Hippo, MD;  Location: [facility] ENDO SUITE;  Service: Endoscopy;  Laterality: N/A;  . Bilateral cataracts   . Battery changed in pacemaker     Family History  Problem Relation Age of Onset  . Anesthesia problems Neg Hx   . Hypotension Neg Hx   . Malignant hyperthermia Neg Hx   . Pseudochol  deficiency Neg Hx   . Cancer Mother     ovarian   Social History:  reports that she has never smoked. She has never used smokeless tobacco. She reports that she does not drink alcohol or use illicit drugs.  Allergies:  Allergies  Allergen Reactions  . Lasix (Furosemide) Swelling  . Penicillins Nausea And Vomiting and Swelling    Lip swelling and redness  . Sulfa Antibiotics Rash    Medications Prior to Admission  Medication Sig Dispense Refill  . bumetanide (BUMEX) 1 MG tablet Take 1 tablet (1 mg total) by mouth daily.  30 tablet  0  . calcium carbonate (OS-CAL - DOSED IN  MG OF ELEMENTAL CALCIUM) 1250 MG tablet Take 1 tablet by mouth 2 (two) times daily.      Marland Kitchen denosumab (PROLIA) 60 MG/ML SOLN Inject 60 mg into the skin every 6 (six) months.       . diltiazem (CARDIZEM CD) 240 MG 24 hr capsule Take 240 mg by mouth daily.      . feeding supplement (ENSURE IMMUNE HEALTH) LIQD Take 237 mLs by mouth daily.       . magnesium hydroxide (MILK OF MAGNESIA) 400 MG/5ML suspension Take 30 mLs by mouth at bedtime.       . pantoprazole (PROTONIX) 40 MG tablet Take 40 mg by mouth daily.      . polysaccharide iron (NIFEREX) 150 MG CAPS capsule Take 150 mg by mouth every morning.       . potassium chloride SA (K-DUR,KLOR-CON) 20 MEQ tablet Take 1 tablet (20 mEq total) by mouth daily.  15 tablet  0  . ramipril (ALTACE) 10 MG capsule Take 10 mg by mouth daily.        . sotalol (BETAPACE) 160 MG tablet Take 1 tablet (160 mg total) by mouth 2 (two) times daily.  60 tablet  6  . warfarin (COUMADIN) 3 MG tablet Take 3-6 mg by mouth daily. Take 1 tab on Monday, Wednesday, and Friday, then 2 tabs all other days      . albuterol (PROVENTIL HFA;VENTOLIN HFA) 108 (90 BASE) MCG/ACT inhaler Inhale 2 puffs into the lungs every 6 (six) hours as needed. For shortness of breath/wheezing        Results for orders placed during the hospital encounter of 02/19/12 (from the past 48 hour(s))  PROTIME-INR     Status: Abnormal   Collection Time   02/19/12  7:15 AM      Component Value Range Comment   Prothrombin Time 19.2 (*) 11.6 - 15.2 seconds    INR 1.68 (*) 0.00 - 1.49   APTT     Status: Normal   Collection Time   02/19/12  7:15 AM      Component Value Range Comment   aPTT 28  24 - 37 seconds   CBC WITH DIFFERENTIAL     Status: Abnormal   Collection Time   02/19/12  7:15 AM      Component Value Range Comment   WBC 6.9  4.0 - 10.5 K/uL    RBC 4.24  3.87 - 5.11 MIL/uL    Hemoglobin 9.3 (*) 12.0 - 15.0 g/dL    HCT 16.1 (*) 09.6 - 46.0 %    MCV 73.1 (*) 78.0 - 100.0 fL    MCH 21.9 (*) 26.0 -  34.0 pg    MCHC 30.0  30.0 - 36.0 g/dL    RDW 04.5 (*) 40.9 - 15.5 %    Platelets  298  150 - 400 K/uL    Neutrophils Relative 74  43 - 77 %    Lymphocytes Relative 17  12 - 46 %    Monocytes Relative 8  3 - 12 %    Eosinophils Relative 1  0 - 5 %    Basophils Relative 0  0 - 1 %    Neutro Abs 5.0  1.7 - 7.7 K/uL    Lymphs Abs 1.2  0.7 - 4.0 K/uL    Monocytes Absolute 0.6  0.1 - 1.0 K/uL    Eosinophils Absolute 0.1  0.0 - 0.7 K/uL    Basophils Absolute 0.0  0.0 - 0.1 K/uL    RBC Morphology POLYCHROMASIA PRESENT   ELLIPTOCYTES  COMPREHENSIVE METABOLIC PANEL     Status: Abnormal   Collection Time   02/19/12  7:15 AM      Component Value Range Comment   Sodium 140  135 - 145 mEq/L    Potassium 4.2  3.5 - 5.1 mEq/L    Chloride 101  96 - 112 mEq/L    CO2 29  19 - 32 mEq/L    Glucose, Bld 126 (*) 70 - 99 mg/dL    BUN 18  6 - 23 mg/dL    Creatinine, Ser 1.47 (*) 0.50 - 1.10 mg/dL    Calcium 9.1  8.4 - 82.9 mg/dL    Total Protein 7.2  6.0 - 8.3 g/dL    Albumin 3.6  3.5 - 5.2 g/dL    AST 20  0 - 37 U/L    ALT 13  0 - 35 U/L    Alkaline Phosphatase 51  39 - 117 U/L    Total Bilirubin 0.6  0.3 - 1.2 mg/dL    GFR calc non Af Amer 42 (*) >90 mL/min    GFR calc Af Amer 48 (*) >90 mL/min   TYPE AND SCREEN     Status: Normal   Collection Time   02/19/12  7:20 AM      Component Value Range Comment   ABO/RH(D) O POS      Antibody Screen NEG      Sample Expiration 02/22/2012      No results found.  Review of Systems  Constitutional: Negative for fever and chills.  Eyes: Negative for blurred vision and double vision.  Respiratory: Negative for shortness of breath.   Cardiovascular: Negative for chest pain, palpitations, orthopnea and PND.  Gastrointestinal: Negative for nausea, vomiting, abdominal pain, diarrhea and blood in stool.  Genitourinary: Negative for dysuria.  Musculoskeletal: Negative for myalgias.  Neurological: Negative for dizziness, tremors, seizures, loss of  consciousness and headaches.  Psychiatric/Behavioral: Negative for substance abuse.    Blood pressure 118/66, pulse 92, temperature 98.1 F (36.7 C), temperature source Oral, resp. rate 18, height 5\' 6"  (1.676 m), weight 170 lb (77.111 kg), SpO2 98.00%. Physical Exam  Vitals reviewed. Constitutional: She is oriented to person, place, and time. She appears well-developed and well-nourished. No distress.  HENT:  Head: Normocephalic and atraumatic.  Right Ear: External ear normal.  Left Ear: External ear normal.  Eyes: Conjunctivae normal are normal. No scleral icterus.  Neck: Neck supple. No tracheal deviation present.  Cardiovascular: Normal rate.   Respiratory: Effort normal. No respiratory distress.  GI: Soft. Bowel sounds are normal. She exhibits no distension. There is no tenderness. There is no rebound.    Musculoskeletal: She exhibits no edema.  Lymphadenopathy:    She has no cervical adenopathy.  Neurological:  She is alert and oriented to person, place, and time.  Skin: Skin is warm and dry. No rash noted. She is not diaphoretic. No erythema. No pallor.  Psychiatric: She has a normal mood and affect. Her behavior is normal. Thought content normal.     Assessment/Plan Cecal adenocarcinoma To OR for Lap assisted Rt hemicolectomy All questions asked and answered.  Pt recd entereg and subcu heparin earlier this am INR 1.68 this am. i'm ok with that level. Pt has T&S. Plan SDU postop  Munachimso Sella. [name], MD, FACS General, Bariatric, & Minimally Invasive Surgery [facility], [place]   [facility] M 02/19/2012, 9:36 AM

## 2012-02-19 NOTE — Anesthesia Procedure Notes (Signed)
Procedure Name: Intubation Date/Time: 02/19/2012 10:18 AM Performed by: Romie Minus K Pre-anesthesia Checklist: Patient identified, Emergency Drugs available, Suction available, Patient being monitored and Timeout performed Patient Re-evaluated:Patient Re-evaluated prior to inductionOxygen Delivery Method: Circle system utilized Preoxygenation: Pre-oxygenation with 100% oxygen Intubation Type: IV induction Ventilation: Mask ventilation without difficulty and Oral airway inserted - appropriate to patient size Laryngoscope Size: Mac and 3 Grade View: Grade II Tube type: Oral Number of attempts: 1 Airway Equipment and Method: Stylet Placement Confirmation: ETT inserted through vocal cords under direct vision,  positive ETCO2 and breath sounds checked- equal and bilateral Secured at: 22 cm Tube secured with: Tape Dental Injury: Teeth and Oropharynx as per pre-operative assessment  Comments: Atraumatic oral intubation by Sharia Reeve, [facility] paramedic. bbse +ETCO2

## 2012-02-19 NOTE — Anesthesia Postprocedure Evaluation (Signed)
  Anesthesia Post-op Note  Patient: [name]  Procedure(s) Performed: Procedure(s) (LRB) with comments: LAPAROSCOPIC RIGHT HEMI COLECTOMY (Right) - attempted laparoscopic assisted right hemi colectomy converted to open. PARTIAL COLECTOMY (Right) LYSIS OF ADHESION (N/A)  Patient Location: PACU  Anesthesia Type:General  Level of Consciousness: awake, alert  and oriented  Airway and Oxygen Therapy: Patient Spontanous Breathing and Patient connected to nasal cannula oxygen  Post-op Pain: mild  Post-op Assessment: Post-op Vital signs reviewed and Patient's Cardiovascular Status Stable  Post-op Vital Signs: stable  Complications: No apparent anesthesia complications

## 2012-02-19 NOTE — Progress Notes (Signed)
PT results called to Dr. [name] at his request. No new orders received.

## 2012-02-19 NOTE — Anesthesia Preprocedure Evaluation (Addendum)
Anesthesia Evaluation  Patient identified by MRN, date of birth, ID band Patient awake    Reviewed: Allergy & Precautions, H&P , NPO status , Patient's Chart, lab work & pertinent test results, reviewed documented beta blocker date and time   History of Anesthesia Complications Negative for: history of anesthetic complications  Airway Mallampati: II TM Distance: >3 FB Neck ROM: Full    Dental  (+) Teeth Intact and Dental Advisory Given   Pulmonary shortness of breath and with exertion,  breath sounds clear to auscultation        Cardiovascular hypertension, Pt. on medications and Pt. on home beta blockers + pacemaker Rhythm:Irregular Rate:Normal     Neuro/Psych    GI/Hepatic GERD-  Medicated and Controlled,  Endo/Other    Renal/GU      Musculoskeletal   Abdominal   Peds  Hematology   Anesthesia Other Findings   Reproductive/Obstetrics                          Anesthesia Physical Anesthesia Plan  ASA: III  Anesthesia Plan: General   Post-op Pain Management:    Induction: Intravenous  Airway Management Planned: Oral ETT  Additional Equipment:   Intra-op Plan:   Post-operative Plan: Extubation in OR  Informed Consent: I have reviewed the patients History and Physical, chart, labs and discussed the procedure including the risks, benefits and alternatives for the proposed anesthesia with the patient or authorized representative who has indicated his/her understanding and acceptance.   Dental advisory given  Plan Discussed with: CRNA and Surgeon  Anesthesia Plan Comments: (Colon Ca with anemia Chronic Afib, Sick sinus syndrome with pacer on coumadin INR 1.68 htn diastolic heart failure EF 55-60% Plan GA  Kipp Brood, MD )      Anesthesia Quick Evaluation

## 2012-02-19 NOTE — Preoperative (Signed)
Beta Blockers   Reason not to administer Beta Blockers:Not Applicable 

## 2012-02-19 NOTE — Transfer of Care (Signed)
Immediate Anesthesia Transfer of Care Note  Patient: [name]  Procedure(s) Performed: Procedure(s) (LRB) with comments: LAPAROSCOPIC RIGHT HEMI COLECTOMY (Right) - attempted laparoscopic assisted right hemi colectomy converted to open. PARTIAL COLECTOMY (Right)  Patient Location: PACU  Anesthesia Type:General  Level of Consciousness: awake, oriented and patient cooperative  Airway & Oxygen Therapy: Patient Spontanous Breathing and Patient connected to face mask oxygen  Post-op Assessment: Report given to PACU RN and Post -op Vital signs reviewed and stable  Post vital signs: Reviewed and stable  Complications: No apparent anesthesia complications

## 2012-02-19 NOTE — Brief Op Note (Signed)
02/19/2012  12:47 PM  PATIENT:  [name]  78 y.o. female  PRE-OPERATIVE DIAGNOSIS:  Cecal colon cancer; h/o T3N0 sigmoid colon cancer  POST-OPERATIVE DIAGNOSIS:  Same + intra-abdominal adhesions  PROCEDURE:  Procedure(s) (LRB) with comments: LAPAROSCOPIC RIGHT HEMI COLECTOMY (Right) - attempted laparoscopic assisted right hemi colectomy converted to open. PARTIAL COLECTOMY (Right) Lysis of adhesions x 45 minutes Placement of On-Q pain catheter x 2  SURGEON:  Surgeon(s) and Role:    * Atilano Ina, MD,FACS - Primary    * Robyne Askew, MD - Assisting  PHYSICIAN ASSISTANT: none  ASSISTANTS: see above   ANESTHESIA:   general  EBL:  Total I/O In: 1200 [I.V.:1200] Out: 575 [Urine:550; Blood:25]  BLOOD ADMINISTERED:none  DRAINS: Urinary Catheter (Foley) and on-q pain catheters   LOCAL MEDICATIONS USED:  MARCAINE     SPECIMEN:  Source of Specimen:  Terminal ileum and rt colon  DISPOSITION OF SPECIMEN:  PATHOLOGY  COUNTS:  YES  TOURNIQUET:  * No tourniquets in log *  DICTATION: .Other Dictation: Dictation Number [id number]  PLAN OF CARE: Admit to inpatient   PATIENT DISPOSITION:  PACU - hemodynamically stable.   Delay start of Pharmacological VTE agent (>24hrs) due to surgical blood loss or risk of bleeding: no

## 2012-02-20 LAB — BASIC METABOLIC PANEL
BUN: 15 mg/dL (ref 6–23)
Chloride: 103 mEq/L (ref 96–112)
GFR calc Af Amer: 54 mL/min — ABNORMAL LOW (ref >90)
Glucose, Bld: 163 mg/dL — ABNORMAL HIGH (ref 70–99)
Potassium: 3.6 mEq/L (ref 3.5–5.1)

## 2012-02-20 LAB — CBC
HCT: 23.3 % — ABNORMAL LOW (ref 36.0–46.0)
HCT: 22.9 % — ABNORMAL LOW (ref 36.0–46.0)
Hemoglobin: 7 g/dL — ABNORMAL LOW (ref 12.0–15.0)
Hemoglobin: 7.1 g/dL — ABNORMAL LOW (ref 12.0–15.0)
MCV: 73.2 fL — ABNORMAL LOW (ref 78.0–100.0)
MCV: 72.7 fL — ABNORMAL LOW (ref 78.0–100.0)
Platelets: 285 10*3/uL (ref 150–400)
RBC: 3.19 MIL/uL — ABNORMAL LOW (ref 3.87–5.11)
RBC: 3.25 MIL/uL — ABNORMAL LOW (ref 3.87–5.11)
RBC: 3.15 MIL/uL — ABNORMAL LOW (ref 3.87–5.11)
RDW: 20.2 % — ABNORMAL HIGH (ref 11.5–15.5)
RDW: 20.4 % — ABNORMAL HIGH (ref 11.5–15.5)
WBC: 11.6 10*3/uL — ABNORMAL HIGH (ref 4.0–10.5)
WBC: 13.9 10*3/uL — ABNORMAL HIGH (ref 4.0–10.5)
WBC: 14.8 10*3/uL — ABNORMAL HIGH (ref 4.0–10.5)

## 2012-02-20 MED ORDER — SODIUM CHLORIDE 0.9 % IV BOLUS (SEPSIS)
500.0000 mL | Freq: Once | INTRAVENOUS | Status: AC
  Administered 2012-02-20: 500 mL via INTRAVENOUS

## 2012-02-20 NOTE — Progress Notes (Signed)
ABL anemia.  Pt with adequate UOP, mental status, HR, and BP.  Will hold on transfusion for now.  May need blood.  Will recheck this afternoon.    Sips of clears OK.  Incentive spirometry.

## 2012-02-20 NOTE — Op Note (Signed)
[name], [name]                  ACCOUNT NO.:  [id number]  MEDICAL RECORD NO.:  [id number]  LOCATION:  3312                         FACILITY:  [facility]  PHYSICIAN:  Jenevieve Sella. [name], MD, FACSDATE OF BIRTH:  Jan 04, 1923  DATE OF PROCEDURE:  02/19/2012 DATE OF DISCHARGE:                              OPERATIVE REPORT   PREOPERATIVE DIAGNOSES: 1. Cecal colon cancer. 2. History of T3 N0 sigmoid colon cancer.  POSTOPERATIVE DIAGNOSES: 1. Cecal colon cancer. 2. History of T3 N0 sigmoid colon cancer 3. Intra-abdominal adhesions.  PROCEDURES: 1. Laparoscopic converted to open right hemicolectomy. 2. Lysis of adhesions x45 minutes 3. Placement of On-Q pain catheter x2.  SURGEON:  Shristi Sella. [name], MD, FACS  ASSISTANT SURGEON:  Ollen Gross. Vernell Morgans, MD FACS  ANESTHESIA:  General.  ESTIMATED BLOOD LOSS:  25 mL.  SPECIMEN:  Terminal ileum and right colon.  FINDINGS:  Upon placing the laparoscope in the abdomen, there was adhesions in the upper and lower midline and it appeared that the right colon had adhesions to the abdominal wall as well.  I felt the safest course of action was to convert to an open procedure.  She had a palpable mass in the cecum.  The liver appeared grossly normal.  There was no other signs of masses.  The patient unfortunately had developed a significant number of recurrent intra-abdominal adhesions.  INDICATIONS FOR PROCEDURE:  The patient is a very pleasant 78 year old lady who underwent an urgent sigmoid colectomy back in January of this year for an obstructing mass which turned out to be a T3 N0 sigmoid colon cancer.  She was unable to have a completion colonoscopy prior to surgery because of the obstructing mass.  Unfortunately, after she recovered, she did not follow up with me for her second postoperative appointment and was never able to be scheduled for the completion colonoscopy.  This past October, the patient was found to be anemic and was admitted to  the hospital where she got transfused with 2 units of blood and had a colonoscopy which demonstrated a 2-cm mass in her cecum which was biopsy-proven to be adenocarcinoma.  Her CEA level was around 10.  She underwent a CT scan of her chest, abdomen, and pelvis which demonstrated no evidence of metastatic disease.  We discussed the risks and benefits of surgery including, but not limited to, bleeding, infection, injury to surrounding structures, need to convert to an open procedure, anastomotic stricture, anastomotic leak, blood clot formation, postoperative ileus, pneumonia, cardiac complications, prolonged hospitalization, recurrence of cancer, incisional hernia, wound infection, and possible death.  I explained to her and her family that she would probably experience the same type of recovery that she had after her sigmoid colectomy.  They elected to proceed to surgery.  DESCRIPTION OF PROCEDURE:  The patient was given a dose of Entereg as well as 5000 units of subcutaneous heparin prior to going to the operating room.  She was brought to the operating room 2 of [facility], placed supine on the operating table.  Sequential compression devices were placed.  General endotracheal anesthesia was established.  A Foley catheter was placed.  Her abdomen  was prepped and draped in the usual standard surgical fashion.  The patient had Multiple abdominal incisions.  She had an old midline incision which was oblique from her total abdominal hysterectomy, and then she had her lower midline incision which was truly in her midline from her sigmoid colectomy.  The surgical time-out was performed.  She received IV Tylenol prior to skin incision.  Local was infiltrated at the base of her umbilicus.  Next, a 1-inch vertical infraumbilical incision was made through her old incision.  The fascia was grasped and lifted anteriorly. Next, the fascia was incised and the abdominal cavity was  entered. Pursestring sutures were placed around the fascial edges with 0 Vicryl and a 12-mm Hasson trocar was placed.  A pneumoperitoneum was smoothly established up to a patient pressure of 15 mmHg.  The laparoscope was advanced.  The patient had evidence of small bowel being a tethered to her upper midline as well as to her lower midline.  I was able to snake the camera around these adhesions and looked in her right abdomen.  The colon appeared pretty much adhered as well.  Because of her age, I felt safest course of action was to get her out of the operating room as quickly as possible and therefore I elected to convert to an open procedure.  Laparoscopic equipment was removed and the pneumoperitoneum was released.  The excision was extended.  We then placed Kochers on each edge of the fascia and then using pair of Metzenbaums, gently lysed the small bowel from the lower midline.  We then turned to the upper midline.  She had some small bowel which was adhered and we ended up taking down some of the peritoneum.  Once the small bowel was taken down, there was evidence of her transverse colon being adhered as well.  We came across several tiny bleeders in the abdominal wall which were either cauterized or suture ligated.  We had to extend the incision superiorly in order to finish taking down the adhesions.  This took about 20 minutes to do. Once we were in her abdomen, a Balfour retractor was placed.  There was a small section in her transverse colon that looked little thin walled, it was repaired with 3 interrupted 3-0 silk sutures in a Lembert style fashion.  She then had an intra-abdominal adhesions.  The Terressa Koyanagi was placed.  We identified the cecum.  We felt the mass.  The liver appeared grossly normal.  The distal terminal ileum was adhered down into the pelvis as well as there was intra-abdominal small bowel adhesions.  We started taking down the intra-abdominal adhesions, starting  at the terminal ileum and working back proximally.  This was done both bluntly as well as with Metzenbaum scissors.  We are able to free about 30 cm of the terminal ileum.  We felt that we had achieved enough lysis adhesions proximally.  We then took down the white line of Toldt laterally with electrocautery as well as blunt dissection.  We were able to rotate the ascending colon medially.  The duodenum was identified and we took a great care to avoid the duodenum.  The patient had no omentum left in her abdomen, it had probably been debulked during her total abdominal hysterectomy for her ovarian cancer.  At this point, we have been lysing adhesions for 45 minutes and we felt that we had adequately freed up the right colon and proximal transverse colon and the distal terminal ileum. A small  rent was made in the mesentery and in the terminal ileum with a Bovie electrocautery.  It was then divided with a linear cutter 75-mm stapler with a blue load.  We then identified where we divided the colon distally.  It was divided just proximal to the middle colic vessel in a similar fashion.  Another load of the GIA stapler with a 75-mm blue load was fired.  We then took down the mesentery in a serial fashion using the Harmonic Scalpel, ensuring that the duodenum was out of the way. The right ureter had been identified and preserved.  We were able to take all the mesentery down to the ileocolonic vascular pedicle.  A Kelly was placed across it proximally.  It was then divided distally with LigaSure.  We then placed two 2-0 silk ties around it.  The specimen was passed off the field.  We then brought the ileum and the transverse colon together in a side-by-side fashion and winded up with a 3-0 silk suture.  Enterotomies were made in the transverse colon and the ileum just in front of the staple line.  One limb of the GIA stapler was placed through each enterotomy and a 75-mm blue load stapler was  used to create a common channel.  The staple line was inspected, it was hemostatic.  The common defect was closed with interrupted 2-0 silk sutures.  We then imbricated the suture line with interrupted 3-0 silk Lembert sutures.  A crotch stitch was placed as well.  The anastomosis was widely patent.  Because of her anatomy, we could not close the mesenteric defect.  The abdominal cavity was irrigated.  I then made a small stab incision in the left upper quadrant and right upper quadrant with an 11 blade.  Then using a blunt tunneler, I tunneled the On-Q pain catheter sheaths, 1 on the left and 1 on the right.  We then closed the midline with running #1 looped PDS, 1 from above and 1 from below with interrupted #1 Novafil serving as internal retention sutures.  The subcutaneous tissue was irrigated and the skin was reapproximated with skin staples.  I then threaded a 5-inch On-Q pain catheters through each of the sheath and removed the sheaths and secured the pain catheter to the skin with Steri-Strips.  Telfa wicks were placed between the skin and the skin staples followed by sterile dressing.  The patient was extubated and taken to the recovery room in stable addition.  All needle, instrument, sponge counts were correct x2.     Neema Sella. [name], MD, FACS     EMW/MEDQ  D:  02/19/2012  T:  02/20/2012  Job:  [id number]  cc:   Lionel December, M.D. Raelyn Mora, MD

## 2012-02-20 NOTE — Progress Notes (Signed)
1 Day Post-Op  Subjective: Patient is sitting up at bedside, states that she is having very little discomfort at present. Appears to be in NAD.  Objective: Vital signs in last 24 hours: Temp:  [97.6 F (36.4 C)-98.7 F (37.1 C)] 97.7 F (36.5 C) (12/07 0812) Pulse Rate:  [83-94] 93  (12/07 0755) Resp:  [9-21] 9  (12/07 0755) BP: (98-140)/(57-81) 98/57 mmHg (12/07 0755) SpO2:  [89 %-100 %] 97 % (12/07 0755) Weight:  [189 lb 6 oz (85.9 kg)-192 lb 7.4 oz (87.3 kg)] 192 lb 7.4 oz (87.3 kg) (12/07 0300) Last BM Date: 02/19/12  Intake/Output from previous day: 12/06 0701 - 12/07 0700 In: 2856.7 [I.V.:2656.7; IV Piggyback:200] Out: 1525 [Urine:1500; Blood:25] Intake/Output this shift: Total I/O In: -  Out: 125 [Urine:125]  General appearance: alert, cooperative, appears stated age and no distress Chest: CTA Cardiac: RRR  Abdomen: soft, minimally tender, OnQ pump is present, Dressing has some drainage mapped out, appears to be stable. Otherwise C/D/I Extremities: warm to touch, no edema or tenderness, +pulses. VS; BP down somme (will give NS bolus, hold cardiazem), afebrile. Labs: WBC has trended upward likely stress response to surgery (will follow) H&H down 2 grams PLT stable INR 1.82  Lab Results:   Basename 02/20/12 0610 02/19/12 0715  WBC 11.6* 6.9  HGB 7.0* 9.3*  HCT 23.3* 31.0*  PLT 260 298   BMET  Basename 02/20/12 0610 02/19/12 0715  NA 137 140  K 3.6 4.2  CL 103 101  CO2 26 29  GLUCOSE 163* 126*  BUN 15 18  CREATININE 1.04 1.15*  CALCIUM 7.5* 9.1   PT/INR  Basename 02/20/12 0610 02/19/12 0715  LABPROT 20.4* 19.2*  INR 1.82* 1.68*   ABG No results found for this basename: PHART:2,PCO2:2,PO2:2,HCO3:2 in the last 72 hours  Studies/Results: No results found.  Anti-infectives: Anti-infectives     Start     Dose/Rate Route Frequency Ordered Stop   02/19/12 0600   metroNIDAZOLE (FLAGYL) IVPB 500 mg        500 mg 100 mL/hr over 60 Minutes Intravenous  On call to O.R. 02/18/12 1324 02/19/12 1020   02/19/12 0600   ciprofloxacin (CIPRO) IVPB 400 mg        400 mg 200 mL/hr over 60 Minutes Intravenous On call to O.R. 02/18/12 1324 02/19/12 1005          Assessment/Plan: s/p Procedure(s) (LRB) with comments: LAPAROSCOPIC RIGHT HEMI COLECTOMY (Right) - attempted laparoscopic assisted right hemi colectomy converted to open. PARTIAL COLECTOMY (Right) LYSIS OF ADHESION (N/A)  Patient Active Problem List  Diagnosis  . Pacemaker-Medtronic  . Edema  . Hypertension  . Atrial fibrillation  . IBS (irritable bowel syndrome)  . Osteoporosis  . Atrial flutter with rapid ventricular response  . Ovarian cancer  . Anemia  . Cancer of sigmoid colon s/p sigmoid colectomy for T3N0 lesion on 1/18  . Tachycardia-bradycardia  . Colonic mass   1. Will continue to monitor H&H ? PRBC (will discuss with Dr. Donell Beers) 2. Continue IVF ABX 3. Continue NPO 4. OOB 5. Follow clinical picture   LOS: 1 day    Blenda Mounts [facility] Surgery Pager [phone]  02/20/2012

## 2012-02-20 NOTE — Progress Notes (Signed)
PT Cancellation Note  Patient Details Name: [name] MRN: [id number] DOB: 1922-04-04   Cancelled Treatment:    Reason Eval/Treat Not Completed: Patient not medically ready. Pressures too low this morning. Will hold and check back as time and schedule allow.    [facility] HELEN 02/20/2012, 8:13 AM Pager: [phone]

## 2012-02-21 LAB — BASIC METABOLIC PANEL
BUN: 11 mg/dL (ref 6–23)
CO2: 25 mEq/L (ref 19–32)
Calcium: 7.5 mg/dL — ABNORMAL LOW (ref 8.4–10.5)
GFR calc non Af Amer: 59 mL/min — ABNORMAL LOW (ref >90)
Glucose, Bld: 145 mg/dL — ABNORMAL HIGH (ref 70–99)
Sodium: 137 mEq/L (ref 135–145)

## 2012-02-21 LAB — CBC
HCT: 22.1 % — ABNORMAL LOW (ref 36.0–46.0)
Hemoglobin: 6.5 g/dL — CL (ref 12.0–15.0)
MCH: 21.7 pg — ABNORMAL LOW (ref 26.0–34.0)
MCHC: 29.4 g/dL — ABNORMAL LOW (ref 30.0–36.0)
RBC: 3 MIL/uL — ABNORMAL LOW (ref 3.87–5.11)

## 2012-02-21 MED ORDER — OXYCODONE-ACETAMINOPHEN 5-325 MG PO TABS
1.0000 | ORAL_TABLET | ORAL | Status: DC | PRN
  Administered 2012-02-22 – 2012-02-23 (×2): 1 via ORAL
  Filled 2012-02-21: qty 2
  Filled 2012-02-21 (×2): qty 1

## 2012-02-21 MED ORDER — ACETAMINOPHEN 325 MG PO TABS
650.0000 mg | ORAL_TABLET | Freq: Four times a day (QID) | ORAL | Status: DC | PRN
  Administered 2012-02-22: 650 mg via ORAL

## 2012-02-21 NOTE — Progress Notes (Signed)
Patient ID: [name], female   DOB: Dec 15, 1922, 78 y.o.   MRN: [id number] 2 Days Post-Op  Subjective: Patient resting comfortably, NAD.  Objective: Vital signs in last 24 hours: Temp:  [97.6 F (36.4 C)-98.5 F (36.9 C)] 97.6 F (36.4 C) (12/08 0424) Pulse Rate:  [88-92] 89  (12/08 0700) Resp:  [16-24] 17  (12/08 0700) BP: (102-128)/(57-66) 122/63 mmHg (12/08 0700) SpO2:  [96 %-98 %] 96 % (12/08 0700) Last BM Date: 02/19/12  Intake/Output from previous day: 12/07 0701 - 12/08 0700 In: 1440 [P.O.:240; I.V.:1200] Out: 1475 [Urine:1475] Intake/Output this shift: Total I/O In: -  Out: 300 [Urine:300]  General appearance: alert, cooperative, appears stated age and no distress Chest: CTA Cardiac: RRR  Abdomen: soft, minimally tender, OnQ pump has been DC'd, Dressing C/D/I staples intact no drainage from surgicall wound  Extremities: warm to touch, no edema or tenderness, +pulses. VSS afebrile. Labs: WBC has trended down, H&H down slightly Plt's stable. (But BP HR stable Color good UOP adequate) BUN and Creatine improved.  Lab Results:   [facility] 02/21/12 0524 02/20/12 1541  WBC 12.2* 14.8*  HGB 6.5* 7.1*  HCT 22.1* 22.9*  PLT 245 253   BMET  Basename 02/21/12 0524 02/20/12 0610  NA 137 137  K 3.9 3.6  CL 106 103  CO2 25 26  GLUCOSE 145* 163*  BUN 11 15  CREATININE 0.86 1.04  CALCIUM 7.5* 7.5*   PT/INR  Basename 02/20/12 0610 02/19/12 0715  LABPROT 20.4* 19.2*  INR 1.82* 1.68*   ABG No results found for this basename: PHART:2,PCO2:2,PO2:2,HCO3:2 in the last 72 hours  Studies/Results: No results found.  Anti-infectives: Anti-infectives     Start     Dose/Rate Route Frequency Ordered Stop   02/19/12 0600   metroNIDAZOLE (FLAGYL) IVPB 500 mg        500 mg 100 mL/hr over 60 Minutes Intravenous On call to O.R. 02/18/12 1324 02/19/12 1020   02/19/12 0600   ciprofloxacin (CIPRO) IVPB 400 mg        400 mg 200 mL/hr over 60 Minutes Intravenous On call to  O.R. 02/18/12 1324 02/19/12 1005          Assessment/Plan: s/p Procedure(s) (LRB) with comments: LAPAROSCOPIC RIGHT HEMI COLECTOMY (Right) - attempted laparoscopic assisted right hemi colectomy converted to open. PARTIAL COLECTOMY (Right) LYSIS OF ADHESION (N/A)  Patient Active Problem List  Diagnosis  . Pacemaker-Medtronic  . Edema  . Hypertension  . Atrial fibrillation  . IBS (irritable bowel syndrome)  . Osteoporosis  . Atrial flutter with rapid ventricular response  . Ovarian cancer  . Anemia  . Cancer of sigmoid colon s/p sigmoid colectomy for T3N0 lesion on 1/18  . Tachycardia-bradycardia  . Colonic mass   1. Will continue to monitor H&H (No blood transfusion for now as VSS, UOP adequate, patient asymptomatic). 2. Continue IVF ABX 3. Clears 4. OOB 5. Follow clinical picture   LOS: 2 days    Golda Acre [facility] Surgery Pager [phone]  02/21/2012

## 2012-02-21 NOTE — Evaluation (Signed)
Physical Therapy Evaluation Patient Details Name: [name] MRN: [id number] DOB: 09/10/1922 Today's Date: 02/21/2012 Time: 7829-5621 PT Time Calculation (min): 12 min  PT Assessment / Plan / Recommendation Clinical Impression  Pt s/p R hemicolectomy. Pt with overall generalized weakness. Evaluation limited secondary to pt fatigued this session declining ambulation. Will continue to reassess for safety at home vs SNF.    PT Assessment  Patient needs continued PT services    Follow Up Recommendations  Home health PT;LTACH;Supervision/Assistance - 24 hour (HH vs SNF pending progress)    Does the patient have the potential to tolerate intense rehabilitation      Barriers to Discharge        Equipment Recommendations  None recommended by PT    Recommendations for Other Services     Frequency Min 3X/week    Precautions / Restrictions Precautions Precautions: Fall Restrictions Weight Bearing Restrictions: No   Pertinent Vitals/Pain Pt complained of abdominal pain 3/10. RN aware.       Mobility  Bed Mobility Bed Mobility: Not assessed Transfers Transfers: Sit to Stand;Stand to Sit Sit to Stand: 4: Min assist;With upper extremity assist;From chair/3-in-1 Stand to Sit: 4: Min assist;With upper extremity assist;To chair/3-in-1 Details for Transfer Assistance: Min assist for stability Ambulation/Gait Ambulation/Gait Assistance: Not tested (comment) (pt declined this session)    Shoulder Instructions     Exercises     PT Diagnosis: Difficulty walking;Acute pain  PT Problem List: Decreased strength;Decreased balance;Decreased mobility;Decreased knowledge of use of DME;Decreased safety awareness;Decreased knowledge of precautions PT Treatment Interventions: DME instruction;Gait training;Stair training;Functional mobility training;Therapeutic activities;Balance training;Patient/family education   PT Goals Acute Rehab PT Goals PT Goal Formulation: With patient Time For  Goal Achievement: 02/28/12 Potential to Achieve Goals: Good Pt will go Sit to Stand: with modified independence PT Goal: Sit to Stand - Progress: Goal set today Pt will go Stand to Sit: with modified independence PT Goal: Stand to Sit - Progress: Goal set today Pt will Ambulate: 51 - 150 feet;with modified independence;with least restrictive assistive device PT Goal: Ambulate - Progress: Goal set today Pt will Go Up / Down Stairs: 1-2 stairs;with supervision;with rail(s) PT Goal: Up/Down Stairs - Progress: Goal set today  Visit Information  Last PT Received On: 02/21/12 Assistance Needed: +1    Subjective Data  Patient Stated Goal: to get better   Prior Functioning  Home Living Lives With: Alone Available Help at Discharge: Family;Personal care attendant;Available PRN/intermittently Type of Home: House Home Access: Stairs to enter [employer] of Steps: 1 Entrance Stairs-Rails: None Home Layout: One level Bathroom Shower/Tub: Forensic scientist: Standard Bathroom Accessibility: Yes How Accessible: Accessible via walker Home Adaptive Equipment: Grab bars around toilet;Grab bars in shower;Shower chair with back;Walker - rolling;Straight cane;Bedside commode/3-in-1 Additional Comments: caretaker comes 3x a week to come help Prior Function Level of Independence: Needs assistance Needs Assistance: Bathing Bath: Minimal Able to Take Stairs?: Yes Driving: Yes Vocation: Retired Musician: No difficulties Dominant Hand: Right    Cognition  Overall Cognitive Status: Appears within functional limits for tasks assessed/performed Arousal/Alertness: Awake/alert Orientation Level: Appears intact for tasks assessed Behavior During Session: [facility] for tasks performed    Extremity/Trunk Assessment Right Lower Extremity Assessment RLE ROM/Strength/Tone: Deficits RLE ROM/Strength/Tone Deficits: grossly 4/5 Left Lower Extremity  Assessment LLE ROM/Strength/Tone: Deficits LLE ROM/Strength/Tone Deficits: grossly 4/5   Balance Balance Balance Assessed: Yes Dynamic Standing Balance Dynamic Standing - Balance Support: Bilateral upper extremity supported Dynamic Standing - Level of Assistance: 4: Min assist  Dynamic Standing - Balance Activities: Other (comment) (marching) Dynamic Standing - Comments: Pt marching with HHA x 2 with min assist for support  End of Session PT - End of Session Equipment Utilized During Treatment: Gait belt Activity Tolerance: Patient limited by fatigue Patient left: in chair;with call bell/phone within reach Nurse Communication: Mobility status  GP     Milana Kidney 02/21/2012, 5:10 PM  02/21/2012 Milana Kidney DPT PAGER: [phone] OFFICE: [phone]

## 2012-02-21 NOTE — Progress Notes (Signed)
HGB  6.5 HCT 22.1 VS'S denies dizziness. Dr Donell Beers Notified she ordered to have 2 units ready but NOT to transfuse at this time.

## 2012-02-21 NOTE — Progress Notes (Signed)
I have seen and examined the patient and agree with the assessment and plans.  Given her cardiac history, will transfuse two units of PRBC's.  Levaughn Puccinelli A. Magnus Ivan  MD, FACS

## 2012-02-21 NOTE — Progress Notes (Signed)
CRITICAL VALUE ALERT  Critical value received:  HGB 6.5  Date of notification:  08 Dec 13  Time of notification:  0615  Critical value read back:yes  Nurse who received alert:  Corrie Mckusick  MD notified (1st page): Dr Donell Beers  Time of first page: 0621  MD notified (2nd page):  Time of second page:  Responding MD:  Dr. Donell Beers  Time MD responded: [phone]

## 2012-02-22 ENCOUNTER — Encounter (HOSPITAL_COMMUNITY): Payer: Self-pay | Admitting: General Surgery

## 2012-02-22 LAB — CBC
HCT: 26.8 % — ABNORMAL LOW (ref 36.0–46.0)
MCH: 24.1 pg — ABNORMAL LOW (ref 26.0–34.0)
MCV: 75.9 fL — ABNORMAL LOW (ref 78.0–100.0)
RBC: 3.53 MIL/uL — ABNORMAL LOW (ref 3.87–5.11)
RDW: 19.9 % — ABNORMAL HIGH (ref 11.5–15.5)
WBC: 9.9 10*3/uL (ref 4.0–10.5)

## 2012-02-22 LAB — TYPE AND SCREEN
ABO/RH(D): O POS
Unit division: 0

## 2012-02-22 NOTE — Progress Notes (Signed)
Physical Therapy Treatment Patient Details Name: [name] MRN: [id number] DOB: [birth year] Today's Date: 02/22/2012 Time: 4098-1191 PT Time Calculation (min): 27 min  PT Assessment / Plan / Recommendation Comments on Treatment Session  Pt able to ambulate with (A) using RW and overall improvement.  Pt will continue to benefit from further therapy prior to d/c home.    Follow Up Recommendations  SNF     Does the patient have the potential to tolerate intense rehabilitation     Barriers to Discharge        Equipment Recommendations  None recommended by PT    Recommendations for Other Services    Frequency Min 3X/week   Plan Discharge plan remains appropriate;Frequency remains appropriate    Precautions / Restrictions Precautions Precautions: Fall Restrictions Weight Bearing Restrictions: No   Pertinent Vitals/Pain No c/o pain    Mobility  Bed Mobility Bed Mobility: Not assessed Transfers Transfers: Sit to Stand;Stand to Sit Sit to Stand: 4: Min assist;From chair/3-in-1;From toilet Stand to Sit: 4: Min guard;To chair/3-in-1;To toilet Details for Transfer Assistance: (A) to initiate transfer and cues for hand placement.  Pt needed cues for proper position prior to sitting on commode. Ambulation/Gait Ambulation/Gait Assistance: 4: Min assist Ambulation Distance (Feet): 200 Feet Assistive device: Rolling walker Ambulation/Gait Assistance Details: Occasional (A) needed for RW placement Gait Pattern: Step-through pattern;Shuffle;Trunk flexed    Exercises General Exercises - Lower Extremity Quad Sets: Strengthening;Both;10 reps Gluteal Sets: Strengthening;5 reps Long Arc Quad: Strengthening;Both;20 reps   PT Diagnosis:    PT Problem List:   PT Treatment Interventions:     PT Goals Acute Rehab PT Goals PT Goal Formulation: With patient Time For Goal Achievement: 02/28/12 Potential to Achieve Goals: Good Pt will go Sit to Stand: with modified independence PT  Goal: Sit to Stand - Progress: Progressing toward goal Pt will go Stand to Sit: with modified independence PT Goal: Stand to Sit - Progress: Progressing toward goal Pt will Ambulate: 51 - 150 feet;with modified independence;with least restrictive assistive device PT Goal: Ambulate - Progress: Progressing toward goal  Visit Information  Last PT Received On: 02/22/12 Assistance Needed: +1    Subjective Data  Subjective: "I'm feeling a little better." Patient Stated Goal: To eventually go home   Cognition  Overall Cognitive Status: Appears within functional limits for tasks assessed/performed Arousal/Alertness: Awake/alert Orientation Level: Appears intact for tasks assessed Behavior During Session: [facility] for tasks performed    Balance     End of Session PT - End of Session Equipment Utilized During Treatment: Gait belt Activity Tolerance: Patient tolerated treatment well Patient left: in chair;with call bell/phone within reach Nurse Communication: Mobility status   GP     Tyrrell Stephens 02/22/2012, 4:07 PM

## 2012-02-22 NOTE — Clinical Social Work Placement (Addendum)
    Clinical Social Work Department CLINICAL SOCIAL WORK PLACEMENT NOTE 02/22/2012  Patient:  [name], [name]  Account Number:  [id number] Admit date:  02/19/2012  Clinical Social Worker:  Theresia Bough, Theresia Majors  Date/time:  02/22/2012 03:19 PM  Clinical Social Work is seeking post-discharge placement for this patient at the following level of care:   SKILLED NURSING   (*CSW will update this form in Epic as items are completed)   02/22/2012  Patient/family provided with [name] Department of Clinical Social Work's list of facilities offering this level of care within the geographic area requested by the patient (or if unable, by the patient's family).  02/22/2012  Patient/family informed of their freedom to choose among providers that offer the needed level of care, that participate in Medicare, Medicaid or managed care program needed by the patient, have an available bed and are willing to accept the patient.  02/22/2012  Patient/family informed of [facility]' ownership interest in [facility], as well as of the fact that they are under no obligation to receive care at this facility.  PASARR submitted to EDS on 02/22/2012 PASARR number received from EDS on 02/22/2012  FL2 transmitted to all facilities in geographic area requested by pt/family on  02/22/2012 FL2 transmitted to all facilities within larger geographic area on   Patient informed that his/her managed care company has contracts with or will negotiate with  certain facilities, including the following:     Patient/family informed of bed offers received:  02/22/2012 Patient chooses bed at [facility] Physician recommends and patient chooses bed at    Patient to be transferred to [facility] on  02/25/12 Patient to be transferred to facility by [facility]  The following physician request were entered in Epic:   Additional Comments:

## 2012-02-22 NOTE — Clinical Social Work Psychosocial (Signed)
    Clinical Social Work Department BRIEF PSYCHOSOCIAL ASSESSMENT 02/22/2012  Patient:  [name], [name]     Account Number:  [id number]     Admit date:  02/19/2012  Clinical Social Worker:  Lourdes Sledge  Date/Time:  02/22/2012 01:36 PM  Referred by:  RN  Date Referred:  02/22/2012 Referred for  SNF Placement   Other Referral:   Interview type:  Patient Other interview type:    PSYCHOSOCIAL DATA Living Status:  ALONE Admitted from facility:   Level of care:   Primary support name:  Porter Nakama [phone] Primary support relationship to patient:  CHILD, ADULT Degree of support available:   Pt states her children are actively involved in her care.    CURRENT CONCERNS Current Concerns  Post-Acute Placement   Other Concerns:    SOCIAL WORK ASSESSMENT / PLAN CSW informed that pt will potentially need SNF at discharge.    CSW visited pt room and introduced herself and role. CSW explored pt living situation and whether pt would be agreeable to ST rehab. Pt stated she lives alone at home however her children are pretty involved in her care. Pt stated she is agreeable to rehab, preferrably at [facility] where pt has been in the past. Pt stated she would also be agreeable to other rehab facilities in [facility].    CSW to do a SNF search and provide bed offers when available.   Assessment/plan status:  Psychosocial Support/Ongoing Assessment of Needs Other assessment/ plan:   Information/referral to community resources:   CSW provided pt with a SNF list and CSW contact information.    PATIENT'S/FAMILY'S RESPONSE TO PLAN OF CARE: Pt sitting up in the recliner alert and oriented. Pt very pleasant to speak to and states she is agreeable to SNF placement preferrably at [facility].

## 2012-02-22 NOTE — Progress Notes (Signed)
Pt has been seen by unit CSW for SNF placement. Pt agreeable to [facility] or any others in [place]. CSW to do a SNF search.  Assessment tool currently not working. Full assessment to follow.   Theresia Bough, MSW, Theresia Majors [phone]

## 2012-02-22 NOTE — Progress Notes (Signed)
3 Days Post-Op  Subjective: +more flatus. +sm BM. Felt full with clears at lunch. Some burping but more gas per bottom. Pain ok. No bloating  Objective: Vital signs in last 24 hours: Temp:  [97.2 F (36.2 C)-98.7 F (37.1 C)] 97.9 F (36.6 C) (12/09 1156) Pulse Rate:  [92-94] 93  (12/09 0353) Resp:  [12-23] 18  (12/09 1156) BP: (110-137)/(58-79) 137/79 mmHg (12/09 1156) SpO2:  [96 %-99 %] 96 % (12/09 1156) Weight:  [193 lb 9 oz (87.8 kg)] 193 lb 9 oz (87.8 kg) (12/09 0353) Last BM Date: 02/19/12  Intake/Output from previous day: 12/08 0701 - 12/09 0700 In: 2609.5 [P.O.:360; I.V.:1350; Blood:899.5] Out: 2100 [Urine:2100] Intake/Output this shift: Total I/O In: 480 [P.O.:480] Out: -   Alert, approp, sitting in chair cta Reg Soft, nd, minTTP. +BS. Incision c/d/i No edema  Lab Results:   [facility] 02/22/12 0420 02/21/12 0524  WBC 9.9 12.2*  HGB 8.5* 6.5*  HCT 26.8* 22.1*  PLT 231 245   BMET  Basename 02/21/12 0524 02/20/12 0610  NA 137 137  K 3.9 3.6  CL 106 103  CO2 25 26  GLUCOSE 145* 163*  BUN 11 15  CREATININE 0.86 1.04  CALCIUM 7.5* 7.5*   PT/INR  Basename 02/20/12 0610  LABPROT 20.4*  INR 1.82*   ABG No results found for this basename: PHART:2,PCO2:2,PO2:2,HCO3:2 in the last 72 hours  Studies/Results: No results found.  Anti-infectives: Anti-infectives     Start     Dose/Rate Route Frequency Ordered Stop   02/19/12 0600   metroNIDAZOLE (FLAGYL) IVPB 500 mg        500 mg 100 mL/hr over 60 Minutes Intravenous On call to O.R. 02/18/12 1324 02/19/12 1020   02/19/12 0600   ciprofloxacin (CIPRO) IVPB 400 mg        400 mg 200 mL/hr over 60 Minutes Intravenous On call to O.R. 02/18/12 1324 02/19/12 1005          Assessment/Plan: s/p Procedure(s) (LRB) with comments: LAPAROSCOPIC RIGHT HEMI COLECTOMY (Right) - attempted laparoscopic assisted right hemi colectomy converted to open. PARTIAL COLECTOMY (Right) LYSIS OF ADHESION  (N/A)  Responded well to transfusion Keep on clears today, fulls in AM Anticipate tx to floor tomorrow If continues to do well, probable d/c on Wednesday PT/OT OOB, IS  Rayshell Sella. [name], MD, FACS General, Bariatric, & Minimally Invasive Surgery [facility], [place]   LOS: 3 days    Atilano Ina 02/22/2012

## 2012-02-23 LAB — CBC
HCT: 29.1 % — ABNORMAL LOW (ref 36.0–46.0)
MCH: 23.4 pg — ABNORMAL LOW (ref 26.0–34.0)
MCV: 75.6 fL — ABNORMAL LOW (ref 78.0–100.0)
RBC: 3.85 MIL/uL — ABNORMAL LOW (ref 3.87–5.11)
WBC: 8 10*3/uL (ref 4.0–10.5)

## 2012-02-23 LAB — BASIC METABOLIC PANEL
BUN: 17 mg/dL (ref 6–23)
CO2: 24 mEq/L (ref 19–32)
Calcium: 7.9 mg/dL — ABNORMAL LOW (ref 8.4–10.5)
Chloride: 106 mEq/L (ref 96–112)
Creatinine, Ser: 0.77 mg/dL (ref 0.50–1.10)

## 2012-02-23 MED ORDER — ENSURE COMPLETE PO LIQD
237.0000 mL | Freq: Two times a day (BID) | ORAL | Status: DC
  Administered 2012-02-24: 237 mL via ORAL

## 2012-02-23 MED ORDER — PANTOPRAZOLE SODIUM 40 MG PO TBEC
40.0000 mg | DELAYED_RELEASE_TABLET | Freq: Every day | ORAL | Status: DC
  Administered 2012-02-23 – 2012-02-24 (×2): 40 mg via ORAL
  Filled 2012-02-23 (×2): qty 1

## 2012-02-23 NOTE — Progress Notes (Signed)
Patient transferred to [facility] room 3.  Report called to RN on 6N and all questions answered. Patient's VVS and patient transferred via wheelchair with NT and family.

## 2012-02-23 NOTE — Progress Notes (Signed)
4 Days Post-Op  Subjective: Small BM. Doing well. No pain. No N/V. Tolerating fulls. Some burping still. Worked with PT  Objective: Vital signs in last 24 hours: Temp:  [97.6 F (36.4 C)-98.6 F (37 C)] 98.1 F (36.7 C) (12/10 0800) Pulse Rate:  [91-94] 93  (12/10 0800) Resp:  [18-26] 18  (12/10 0800) BP: (106-137)/(54-87) 122/73 mmHg (12/10 0800) SpO2:  [94 %-98 %] 98 % (12/10 0300) Last BM Date: 02/22/12  Intake/Output from previous day: 12/09 0701 - 12/10 0700 In: 1440 [P.O.:840; I.V.:600] Out: 900 [Urine:900] Intake/Output this shift: Total I/O In: 50 [I.V.:50] Out: -   Alert, nad, appropriate cta ant b/l Reg Soft, nd. Incision c/d/i No edema. +SCDs  Lab Results:   [facility] 02/23/12 0625 02/22/12 0420  WBC 8.0 9.9  HGB 9.0* 8.5*  HCT 29.1* 26.8*  PLT 257 231   BMET  Basename 02/23/12 0625 02/21/12 0524  NA 139 137  K 3.5 3.9  CL 106 106  CO2 24 25  GLUCOSE 132* 145*  BUN 17 11  CREATININE 0.77 0.86  CALCIUM 7.9* 7.5*   PT/INR  Basename 02/23/12 0625  LABPROT 19.1*  INR 1.66*   ABG No results found for this basename: PHART:2,PCO2:2,PO2:2,HCO3:2 in the last 72 hours  Studies/Results: No results found.  Anti-infectives: Anti-infectives     Start     Dose/Rate Route Frequency Ordered Stop   02/19/12 0600   metroNIDAZOLE (FLAGYL) IVPB 500 mg        500 mg 100 mL/hr over 60 Minutes Intravenous On call to O.R. 02/18/12 1324 02/19/12 1020   02/19/12 0600   ciprofloxacin (CIPRO) IVPB 400 mg        400 mg 200 mL/hr over 60 Minutes Intravenous On call to O.R. 02/18/12 1324 02/19/12 1005          Assessment/Plan: s/p Procedure(s) (LRB) with comments: LAPAROSCOPIC RIGHT HEMI COLECTOMY (Right) - attempted laparoscopic assisted right hemi colectomy converted to open. PARTIAL COLECTOMY (Right) LYSIS OF ADHESION (N/A)  Looks good.  tx to floor Cont to hold subcu heparin secondary to risk of bleeding. INR 1.6.  Will restart coumadin at [name] Pt/ot Ensure supplements Solids in am Anticipate tx to [name] Wednesday  [name]. Carla Campanile, MD, FACS General, Bariatric, & Minimally Invasive Surgery [name]   LOS: 4 days    [name] 02/23/2012

## 2012-02-23 NOTE — Progress Notes (Signed)
Utilization review completed.  

## 2012-02-24 LAB — CBC
Hemoglobin: 7.8 g/dL — ABNORMAL LOW (ref 12.0–15.0)
MCH: 23.1 pg — ABNORMAL LOW (ref 26.0–34.0)
RBC: 3.38 MIL/uL — ABNORMAL LOW (ref 3.87–5.11)
WBC: 6.9 10*3/uL (ref 4.0–10.5)

## 2012-02-24 LAB — BASIC METABOLIC PANEL
BUN: 22 mg/dL (ref 6–23)
CO2: 27 mEq/L (ref 19–32)
Chloride: 105 mEq/L (ref 96–112)
Creatinine, Ser: 0.93 mg/dL (ref 0.50–1.10)
Glucose, Bld: 94 mg/dL (ref 70–99)

## 2012-02-24 MED ORDER — POLYSACCHARIDE IRON COMPLEX 150 MG PO CAPS
150.0000 mg | ORAL_CAPSULE | Freq: Every day | ORAL | Status: DC
  Administered 2012-02-24: 150 mg via ORAL
  Filled 2012-02-24 (×2): qty 1

## 2012-02-24 MED ORDER — POTASSIUM CHLORIDE CRYS ER 20 MEQ PO TBCR
40.0000 meq | EXTENDED_RELEASE_TABLET | Freq: Once | ORAL | Status: AC
  Administered 2012-02-24: 40 meq via ORAL
  Filled 2012-02-24: qty 2

## 2012-02-24 NOTE — Progress Notes (Signed)
5 Days Post-Op  Subjective: Doing well. No n/v. +flatus. No BM yesterday. Minimal burping  Objective: Vital signs in last 24 hours: Temp:  [97.4 F (36.3 C)-99.1 F (37.3 C)] 97.5 F (36.4 C) (12/11 0548) Pulse Rate:  [76-97] 76  (12/11 0548) Resp:  [16-24] 18  (12/11 0548) BP: (96-116)/(44-89) 100/44 mmHg (12/11 0548) SpO2:  [95 %-99 %] 98 % (12/11 0548) Last BM Date: 02/23/12  Intake/Output from previous day: 12/10 0701 - 12/11 0700 In: 560 [P.O.:240; I.V.:320] Out: 1350 [Urine:1350] Intake/Output this shift:    Alert, nad cta b/l Reg Soft, nt, nd. Incision c/d/i  no edema  Lab Results:   [facility] 02/24/12 0520 02/23/12 0625  WBC 6.9 8.0  HGB 7.8* 9.0*  HCT 25.7* 29.1*  PLT 236 257   BMET  Basename 02/24/12 0520 02/23/12 0625  NA 139 139  K 3.4* 3.5  CL 105 106  CO2 27 24  GLUCOSE 94 132*  BUN 22 17  CREATININE 0.93 0.77  CALCIUM 8.0* 7.9*   PT/INR  Basename 02/24/12 0520 02/23/12 0625  LABPROT 21.6* 19.1*  INR 1.96* 1.66*   ABG No results found for this basename: PHART:2,PCO2:2,PO2:2,HCO3:2 in the last 72 hours  Studies/Results: No results found.  Anti-infectives: Anti-infectives     Start     Dose/Rate Route Frequency Ordered Stop   02/19/12 0600   metroNIDAZOLE (FLAGYL) IVPB 500 mg        500 mg 100 mL/hr over 60 Minutes Intravenous On call to O.R. 02/18/12 1324 02/19/12 1020   02/19/12 0600   ciprofloxacin (CIPRO) IVPB 400 mg        400 mg 200 mL/hr over 60 Minutes Intravenous On call to O.R. 02/18/12 1324 02/19/12 1005          Assessment/Plan: s/p Procedure(s) (LRB) with comments: LAPAROSCOPIC RIGHT HEMI COLECTOMY (Right) - attempted laparoscopic assisted right hemi colectomy converted to open. PARTIAL COLECTOMY (Right) LYSIS OF ADHESION (N/A)  Solid food today PT/OT Cont home BP meds Hypokalemia - replace with Kdur Anemia - ABL on chronic anemia - hgb drifted down from 9 to 7.8 this am. Vitals ok. hct from 29 to 25. Since  hgb drifted down 1 point and pt has been heplocked, i want to keep her 1 more day to monitor her hgb to make sure it is not dropping. Will restart home iron medication Anticoagulation - interesting INR increasing without coumadin administration - will follow for now Dispo - SNF Thursday as long as hgb does not cont to drop  Abeni Sella. [name], MD, FACS General, Bariatric, & Minimally Invasive Surgery [facility], [place]   LOS: 5 days    Atilano Ina 02/24/2012

## 2012-02-24 NOTE — Progress Notes (Signed)
Physical Therapy Treatment Patient Details Name: [name] MRN: [id number] DOB: [birth year] Today's Date: 02/24/2012 Time: 1191-4782 PT Time Calculation (min): 30 min  PT Assessment / Plan / Recommendation Comments on Treatment Session  Pt presenting to be moving well with RW into hallway and able to increase her distance with decresed (A). Pt given some cuing on technique and staying close to RW.     Follow Up Recommendations  SNF     Does the patient have the potential to tolerate intense rehabilitation     Barriers to Discharge        Equipment Recommendations  None recommended by PT    Recommendations for Other Services    Frequency Min 3X/week   Plan Discharge plan remains appropriate;Frequency remains appropriate    Precautions / Restrictions Precautions Precautions: Fall Restrictions Weight Bearing Restrictions: No   Pertinent Vitals/Pain Patient denying pain, just fullness in her stomach   Mobility  Bed Mobility Bed Mobility: Sit to Sidelying Right;Rolling Right Rolling Right: 5: Supervision Sit to Sidelying Right: 5: Supervision;With rail;HOB flat Details for Bed Mobility Assistance: Pt given no physical (A) S/L>supine. VC for technique Transfers Transfers: Sit to Stand;Stand to Sit Sit to Stand: 4: Min guard;With upper extremity assist;With armrests;From chair/3-in-1;From toilet Stand to Sit: 4: Min guard;With upper extremity assist;To bed;To toilet Details for Transfer Assistance: Pt min guard for safety and steadiness and give VC for proper hand placement sitting from/standing to RW. Ambulation/Gait Ambulation/Gait Assistance: 4: Min guard Ambulation Distance (Feet): 220 Feet Assistive device: Rolling walker Ambulation/Gait Assistance Details: Pt. min guard with ambulation into hallway with RW. Pt given VC for staying close to RW and to keep RW close to her when sitting down and for directional change technique Gait Pattern: Step-through pattern;Decreased  stride length Stairs: No Wheelchair Mobility Wheelchair Mobility: No      PT Goals Acute Rehab PT Goals PT Goal Formulation: With patient Time For Goal Achievement: 02/28/12 Potential to Achieve Goals: Good Pt will go Sit to Stand: with modified independence PT Goal: Sit to Stand - Progress: Progressing toward goal Pt will go Stand to Sit: with modified independence PT Goal: Stand to Sit - Progress: Progressing toward goal Pt will Ambulate: 51 - 150 feet;with modified independence;with least restrictive assistive device PT Goal: Ambulate - Progress: Progressing toward goal Pt will Go Up / Down Stairs: 1-2 stairs;with supervision;with rail(s)  Visit Information  Last PT Received On: 02/24/12 Assistance Needed: +1    Subjective Data  Subjective: "My stomach feels so full" Patient Stated Goal: To eventually go home   Cognition  Overall Cognitive Status: Appears within functional limits for tasks assessed/performed Arousal/Alertness: Awake/alert Orientation Level: Appears intact for tasks assessed Behavior During Session: [facility] for tasks performed    Balance  Balance Balance Assessed: No  End of Session PT - End of Session Equipment Utilized During Treatment: Gait belt Activity Tolerance: Patient tolerated treatment well Patient left: in bed;with call bell/phone within reach;with family/visitor present Nurse Communication: Mobility status;Other (comment) (Ok to treat with Hgb 7.8)    Oluwanifemi Susman, SPTA 02/24/2012, 3:12 PM

## 2012-02-24 NOTE — Progress Notes (Signed)
OT Note: Pt with plans to d/c to SNF when medically stable. Will defer OT eval to SNF per departmental protocol. Will sign off acutely. Thank you, Glendale Chard, OTR/L Pager: [phone] 02/24/2012

## 2012-02-24 NOTE — Progress Notes (Signed)
Patient evaluated for community based chronic disease management services with [facility] Care Management Program as a benefit of patient's [employer].  Spoke with patient and her daughter at bedside to explain [facility] Care Management services. Left contact information and [facility] literature at bedside.  Patient would like to have her son, Jorja Loa, Medical POA, to make final decision regarding services following her short term rehab course at [facility].  Provided contact information for her son. Of note, [facility] Care Management services does not replace or interfere with any services that are arranged by inpatient case management or social work.  For additional questions or referrals please contact Anibal Henderson BSN RN [facility] Liaison at [phone].

## 2012-02-25 ENCOUNTER — Encounter (HOSPITAL_COMMUNITY): Payer: Self-pay | Admitting: Emergency Medicine

## 2012-02-25 ENCOUNTER — Inpatient Hospital Stay
Admission: RE | Admit: 2012-02-25 | Discharge: 2012-02-25 | Disposition: A | Discharge status: Other Acute Inpatient Hospital | Payer: Medicaid Other | Source: Ambulatory Visit | Attending: Internal Medicine | Admitting: Internal Medicine

## 2012-02-25 ENCOUNTER — Inpatient Hospital Stay (HOSPITAL_COMMUNITY)
Admission: EM | Admit: 2012-02-25 | Discharge: 2012-02-29 | DRG: 064 | Disposition: A | Discharge status: Skilled Nursing Facility | Payer: Medicare Other | Attending: Internal Medicine | Admitting: Internal Medicine

## 2012-02-25 ENCOUNTER — Emergency Department (HOSPITAL_COMMUNITY): Payer: Medicare Other

## 2012-02-25 DIAGNOSIS — Z950 Presence of cardiac pacemaker: Secondary | ICD-10-CM

## 2012-02-25 DIAGNOSIS — I4891 Unspecified atrial fibrillation: Secondary | ICD-10-CM

## 2012-02-25 DIAGNOSIS — Z9049 Acquired absence of other specified parts of digestive tract: Secondary | ICD-10-CM

## 2012-02-25 DIAGNOSIS — I10 Essential (primary) hypertension: Secondary | ICD-10-CM

## 2012-02-25 DIAGNOSIS — D500 Iron deficiency anemia secondary to blood loss (chronic): Secondary | ICD-10-CM | POA: Diagnosis present

## 2012-02-25 DIAGNOSIS — Z79899 Other long term (current) drug therapy: Secondary | ICD-10-CM

## 2012-02-25 DIAGNOSIS — K5900 Constipation, unspecified: Secondary | ICD-10-CM | POA: Diagnosis present

## 2012-02-25 DIAGNOSIS — M129 Arthropathy, unspecified: Secondary | ICD-10-CM | POA: Diagnosis present

## 2012-02-25 DIAGNOSIS — I495 Sick sinus syndrome: Secondary | ICD-10-CM

## 2012-02-25 DIAGNOSIS — I4892 Unspecified atrial flutter: Secondary | ICD-10-CM

## 2012-02-25 DIAGNOSIS — I639 Cerebral infarction, unspecified: Secondary | ICD-10-CM

## 2012-02-25 DIAGNOSIS — C187 Malignant neoplasm of sigmoid colon: Secondary | ICD-10-CM

## 2012-02-25 DIAGNOSIS — Z882 Allergy status to sulfonamides status: Secondary | ICD-10-CM

## 2012-02-25 DIAGNOSIS — I5030 Unspecified diastolic (congestive) heart failure: Secondary | ICD-10-CM | POA: Diagnosis present

## 2012-02-25 DIAGNOSIS — R609 Edema, unspecified: Secondary | ICD-10-CM

## 2012-02-25 DIAGNOSIS — K589 Irritable bowel syndrome without diarrhea: Secondary | ICD-10-CM

## 2012-02-25 DIAGNOSIS — G9340 Encephalopathy, unspecified: Secondary | ICD-10-CM | POA: Diagnosis present

## 2012-02-25 DIAGNOSIS — R4789 Other speech disturbances: Secondary | ICD-10-CM | POA: Diagnosis present

## 2012-02-25 DIAGNOSIS — C182 Malignant neoplasm of ascending colon: Secondary | ICD-10-CM | POA: Diagnosis present

## 2012-02-25 DIAGNOSIS — Z7901 Long term (current) use of anticoagulants: Secondary | ICD-10-CM

## 2012-02-25 DIAGNOSIS — Z8543 Personal history of malignant neoplasm of ovary: Secondary | ICD-10-CM

## 2012-02-25 DIAGNOSIS — R7309 Other abnormal glucose: Secondary | ICD-10-CM

## 2012-02-25 DIAGNOSIS — D649 Anemia, unspecified: Secondary | ICD-10-CM

## 2012-02-25 DIAGNOSIS — K219 Gastro-esophageal reflux disease without esophagitis: Secondary | ICD-10-CM | POA: Diagnosis present

## 2012-02-25 DIAGNOSIS — Z90710 Acquired absence of both cervix and uterus: Secondary | ICD-10-CM

## 2012-02-25 DIAGNOSIS — I509 Heart failure, unspecified: Secondary | ICD-10-CM | POA: Diagnosis present

## 2012-02-25 DIAGNOSIS — E669 Obesity, unspecified: Secondary | ICD-10-CM | POA: Diagnosis present

## 2012-02-25 DIAGNOSIS — M810 Age-related osteoporosis without current pathological fracture: Secondary | ICD-10-CM | POA: Diagnosis present

## 2012-02-25 DIAGNOSIS — R739 Hyperglycemia, unspecified: Secondary | ICD-10-CM | POA: Diagnosis present

## 2012-02-25 DIAGNOSIS — Z9079 Acquired absence of other genital organ(s): Secondary | ICD-10-CM

## 2012-02-25 DIAGNOSIS — I6350 Cerebral infarction due to unspecified occlusion or stenosis of unspecified cerebral artery: Principal | ICD-10-CM | POA: Diagnosis present

## 2012-02-25 DIAGNOSIS — Z880 Allergy status to penicillin: Secondary | ICD-10-CM

## 2012-02-25 DIAGNOSIS — C569 Malignant neoplasm of unspecified ovary: Secondary | ICD-10-CM

## 2012-02-25 LAB — APTT
aPTT: 34 seconds (ref 24–37)
aPTT: 28 seconds (ref 24–37)

## 2012-02-25 LAB — PROTIME-INR
INR: 1.48 (ref 0.00–1.49)
Prothrombin Time: 17.5 seconds — ABNORMAL HIGH (ref 11.6–15.2)
Prothrombin Time: 14.7 seconds (ref 11.6–15.2)

## 2012-02-25 LAB — CBC
HCT: 28.2 % — ABNORMAL LOW (ref 36.0–46.0)
MCHC: 30.5 g/dL (ref 30.0–36.0)
MCV: 76.6 fL — ABNORMAL LOW (ref 78.0–100.0)
MCV: 77.9 fL — ABNORMAL LOW (ref 78.0–100.0)
Platelets: 254 10*3/uL (ref 150–400)
Platelets: 308 10*3/uL (ref 150–400)
RBC: 3.37 MIL/uL — ABNORMAL LOW (ref 3.87–5.11)
RDW: 21.6 % — ABNORMAL HIGH (ref 11.5–15.5)
RDW: 21.5 % — ABNORMAL HIGH (ref 11.5–15.5)
WBC: 7.2 10*3/uL (ref 4.0–10.5)
WBC: 6.7 10*3/uL (ref 4.0–10.5)

## 2012-02-25 LAB — COMPREHENSIVE METABOLIC PANEL
ALT: 16 U/L (ref 0–35)
AST: 16 U/L (ref 0–37)
Albumin: 2.7 g/dL — ABNORMAL LOW (ref 3.5–5.2)
Calcium: 8.8 mg/dL (ref 8.4–10.5)
Creatinine, Ser: 1.01 mg/dL (ref 0.50–1.10)
Sodium: 140 mEq/L (ref 135–145)

## 2012-02-25 LAB — DIFFERENTIAL
Basophils Absolute: 0 10*3/uL (ref 0.0–0.1)
Basophils Relative: 1 % (ref 0–1)
Eosinophils Relative: 5 % (ref 0–5)
Monocytes Absolute: 0.8 10*3/uL (ref 0.1–1.0)
Neutro Abs: 4.3 10*3/uL (ref 1.7–7.7)

## 2012-02-25 LAB — GLUCOSE, CAPILLARY: Glucose-Capillary: 151 mg/dL — ABNORMAL HIGH (ref 70–99)

## 2012-02-25 MED ORDER — WARFARIN SODIUM 3 MG PO TABS: 3.0000 mg | ORAL_TABLET | Freq: Every day | ORAL | Status: DC

## 2012-02-25 MED ORDER — OXYCODONE-ACETAMINOPHEN 5-325 MG PO TABS: 1.0000 | ORAL_TABLET | ORAL | Status: DC | PRN

## 2012-02-25 MED ORDER — SODIUM CHLORIDE 0.9 % IV SOLN: INTRAVENOUS | Status: DC

## 2012-02-25 MED ORDER — ENSURE COMPLETE PO LIQD: 237.0000 mL | Freq: Two times a day (BID) | ORAL | Status: DC

## 2012-02-25 NOTE — ED Provider Notes (Signed)
History     CSN: [id number]  Arrival date & time 02/25/12  1945   First MD Initiated Contact with Patient 02/25/12 1956      Chief Complaint  Patient presents with  . Blurred Vision  . Aphasia    (Consider location/radiation/quality/duration/timing/severity/associated sxs/prior treatment) HPI History from patient, son and electronic record. Level 5 caveat due to aphasia. Pt discharged from [facility] today after hemicolectomy for colon CA. She was doing well post-op and was talking to her son at the [facility] about prior to arrival when she had sudden onset of slurred speech, expressive aphasia and R sided facial droop. She was brought to the ED via wheelchair for evaluation. She has history of afib and has been off her coumadin for the surgery.   Past Medical History  Diagnosis Date  . Diastolic heart failure   . Ovarian cancer   . Colon cancer   . Arthritis   . History of Clostridium difficile infection     in Jan 2013  . Pacemaker   . History of cellulitis and abscess   . Hypertension     takes Ramipril,Diltiazem daily  . Atrial fibrillation     takes Coumadin daily  . Tachy-brady syndrome 2008    s/p PPM implantation by Dr Gifford Shave takes Betapace daily  . Osteoporosis     takes Prolia every 6months injections  . Shortness of breath     with exertion;Albuterol prn  . Bruises easily     takes COumadin daily  . GERD (gastroesophageal reflux disease)     takes Protonix daily  . Constipation     Milk of Mag nightly  . Anemia     takes Iron daily    Past Surgical History  Procedure Date  . Abdominal hysterectomy   . Oophorectomy   . Colonoscopy 03/30/2011    Procedure: COLONOSCOPY;  Surgeon: Barrie Folk, MD;  Location: [facility] ENDOSCOPY;  Service: Endoscopy;  Laterality: N/A;  . Esophagogastroduodenoscopy 03/30/2011    Procedure: ESOPHAGOGASTRODUODENOSCOPY (EGD);  Surgeon: Barrie Folk, MD;  Location: [facility] ENDOSCOPY;  Service: Endoscopy;  Laterality: N/A;  . Colon  resection 04/03/2011    Procedure: COLON RESECTION LAPAROSCOPIC;  Surgeon: Atilano Ina, MD;  Location: [facility] OR;  Service: General;  Laterality: N/A;  Laparoscopic, turned open sigmoid colon resection, lysis of adhesions x 2.5 hours, rigid proctoscopy.  . Pacemaker placement 2008    MDT EnRhythm implanted by Dr Amil Amen, generator change (MDT Adapta L) by Dr Johney Frame 08/04/11  . Colonoscopy 12/24/2011    Procedure: COLONOSCOPY;  Surgeon: Malissa Hippo, MD;  Location: [facility] ENDO SUITE;  Service: Endoscopy;  Laterality: N/A;  . Bilateral cataracts   . Battery changed in pacemaker   . Laparoscopic right hemi colectomy 02/19/2012    Procedure: LAPAROSCOPIC RIGHT HEMI COLECTOMY;  Surgeon: Atilano Ina, MD,FACS;  Location: [facility] OR;  Service: General;  Laterality: Right;  attempted laparoscopic assisted right hemi colectomy converted to open.  . Partial colectomy 02/19/2012    Procedure: PARTIAL COLECTOMY;  Surgeon: Atilano Ina, MD,FACS;  Location: [facility] OR;  Service: General;  Laterality: Right;  . Lysis of adhesion 02/19/2012    Procedure: LYSIS OF ADHESION;  Surgeon: Atilano Ina, MD,FACS;  Location: [facility] OR;  Service: General;  Laterality: N/A;    Family History  Problem Relation Age of Onset  . Anesthesia problems Neg Hx   . Hypotension Neg Hx   . Malignant hyperthermia Neg Hx   . Pseudochol deficiency  Neg Hx   . Cancer Mother     ovarian    History  Substance Use Topics  . Smoking status: Never Smoker   . Smokeless tobacco: Never Used  . Alcohol Use: No    OB History    Grav Para Term Preterm Abortions TAB SAB Ect Mult Living                  Review of Systems Unable to assess due to mental status.   Allergies  Lasix; Penicillins; and Sulfa antibiotics  Home Medications   Current Outpatient Rx  Name  Route  Sig  Dispense  Refill  . ALBUTEROL SULFATE HFA 108 (90 BASE) MCG/ACT IN AERS   Inhalation   Inhale 2 puffs into the lungs every 6 (six) hours as needed. For shortness of  breath/wheezing         . BUMETANIDE 1 MG PO TABS   Oral   Take 1 tablet (1 mg total) by mouth daily.   30 tablet   0   . CALCIUM CARBONATE 1250 MG PO TABS   Oral   Take 1 tablet by mouth 2 (two) times daily.         Marland Kitchen DILTIAZEM HCL ER COATED BEADS 240 MG PO CP24   Oral   Take 240 mg by mouth daily.         Marland Kitchen ENSURE COMPLETE PO LIQD   Oral   Take 237 mLs by mouth 2 (two) times daily between meals.         Marland Kitchen MAGNESIUM HYDROXIDE 400 MG/5ML PO SUSP   Oral   Take 30 mLs by mouth at bedtime.          . OXYCODONE-ACETAMINOPHEN 5-325 MG PO TABS   Oral   Take 1-2 tablets by mouth every 4 (four) hours as needed.   30 tablet   0   . PANTOPRAZOLE SODIUM 40 MG PO TBEC   Oral   Take 40 mg by mouth daily.         Marland Kitchen POLYSACCHARIDE IRON 150 MG PO CAPS   Oral   Take 150 mg by mouth every morning.          Marland Kitchen POTASSIUM CHLORIDE CRYS ER 20 MEQ PO TBCR   Oral   Take 20 mEq by mouth daily.         Marland Kitchen RAMIPRIL 10 MG PO CAPS   Oral   Take 10 mg by mouth daily.           Marland Kitchen SOTALOL HCL 160 MG PO TABS   Oral   Take 1 tablet (160 mg total) by mouth 2 (two) times daily.   60 tablet   6   . DENOSUMAB 60 MG/ML [place] SOLN   Subcutaneous   Inject 60 mg into the skin every 6 (six) months.          . WARFARIN SODIUM 3 MG PO TABS   Oral   Take 1-2 tablets (3-6 mg total) by mouth daily. RESUME NEXT Monday - 02/29/12; Take 1 tab on Monday, Wednesday, and Friday, then 2 tabs all other days           BP 145/65  Pulse 72  Temp 98.3 F (36.8 C) (Oral)  Resp 20  Wt 193 lb (87.544 kg)  SpO2 100%  Physical Exam  Nursing note and vitals reviewed. Constitutional: She appears well-developed and well-nourished.  HENT:  Head: Normocephalic and atraumatic.  Eyes: EOM are normal. Pupils  are equal, round, and reactive to light.  Neck: Normal range of motion. Neck supple.  Cardiovascular: Normal rate, normal heart sounds and intact distal pulses.   Pulmonary/Chest: Effort  normal and breath sounds normal.  Abdominal: Bowel sounds are normal. She exhibits no distension. There is no tenderness.  Musculoskeletal: Normal range of motion. She exhibits no edema and no tenderness.  Neurological: She is alert. She displays normal reflexes. A cranial nerve deficit (R sided facial droop) is present. No sensory deficit.       R arm and leg weakness compared to the L. Dysarthria and expressive aphasia  Skin: Skin is warm and dry. No rash noted.  Psychiatric: She has a normal mood and affect.    ED Course  Procedures (including critical care time)   Labs Reviewed  PROTIME-INR  APTT  CBC  DIFFERENTIAL  COMPREHENSIVE METABOLIC PANEL  TROPONIN I   No results found.   No diagnosis found.    MDM  Pt with probable stroke, not a candidate for tPA given recent abdominal surgery. Code Stroke not called. Labs and CT ordered.    Date: 02/25/2012  Rate: 62  Rhythm: atrially paced  QRS Axis: left  Intervals: normal  ST/T Wave abnormalities: nonspecific ST/T changes  Conduction Disutrbances:right bundle branch block and left anterior fascicular block  Narrative Interpretation:   Old EKG Reviewed: unchanged   Pt with symptoms of acute stroke, not a candidate for tPA. Also cannot have MRI due to pacemaker. Admit for further eval.      Charles B. Bernette Mayers, MD 02/25/12 2200

## 2012-02-25 NOTE — ED Notes (Signed)
Patient had colon cancer surgery on Friday; was admitted to [facility] today at lunch time for rehab.  Son states that patient was sitting in chair talking to him and then her head fell backward and when she lifted her head back her eyes were going in 2 different directions and she wasn't talking.  She had slurred speech when she began talking again, and c/o having blurred vision.  Patient with left sided mouth droop.  Patient is A&O; skin w/d. Respirations even and unlabored; able to speak in complete sentences without difficulty.  Equal grips bilaterally; patient moves all extremities well. Patient with clear speech at this time; face symmetrical, smile symmetrical.

## 2012-02-25 NOTE — Discharge Summary (Signed)
Physician Discharge Summary  [name]:[id number] DOB: [birth year] DOA: 02/19/2012  PCP: Bennie Pierini, FNP  Admit date: 02/19/2012 Discharge date: 02/25/2012  Recommendations for Outpatient Follow-up:  1. Follow- up with your primary care doctor within the month  Follow-up Information    Follow up with Atilano Ina, MD,FACS. On 03/03/2012. (1:30 PM)    Contact information:   [address] [place] [address] [phone]         Discharge Diagnoses: Patient Active Problem List  Diagnosis  . Pacemaker-Medtronic  . Edema  . Hypertension  . Atrial fibrillation  . IBS (irritable bowel syndrome)  . Osteoporosis  . Atrial flutter with rapid ventricular response  . Ovarian cancer  . Anemia  . Cancer of sigmoid colon s/p sigmoid colectomy for T3N0 lesion on 1/18  . Tachycardia-bradycardia  . Cancer of right colon -02/2012     Surgical Procedure: Laparoscopic converted to Open Right Colectomy, lysis of adhesions 02/19/2012  Discharge Condition: good Disposition: to SNF  Diet recommendation: Cardiac Fit  Filed Weights   02/19/12 1425 02/20/12 0300 02/22/12 0353  Weight: 189 lb 6 oz (85.9 kg) 192 lb 7.4 oz (87.3 kg) 193 lb 9 oz (87.8 kg)    Hospital Course:  78 year old Caucasian female was admitted for planned right colectomy for previously diagnosed cecal carcinoma. She underwent laparoscopic converted to open right hemicolectomy and lysis of adhesions. Postoperatively she was placed in the step down unit for monitoring. She was placed on Entereg perioperatively. She was given chemical DVT prophylaxis perioperatively. However her hemoglobin decreased on postop day 3. She ultimately was transfused 2 units of packed red blood cells because of her cardiac history. She was begun on on clear liquids on postop day 3. Her Foley had been discontinued. Physical therapy was consulted. She was subsequently transferred to a regular room. Her diet was gradually  advanced. On December 12, she was deemed stable for discharge. Although her hemoglobin had decreased by 1 point -it remained stable for 2 days in a row. She was tolerating a regular diet. She is having bowel movements. Her incisions were clean dry and intact. Her vital signs are stable. Her pain was well-controlled.  Discharge exam- BP 123/63  Pulse 99  Temp 98 F (36.7 C) (Oral)  Resp 17  Ht 5\' 8"  (1.727 m)  Wt 193 lb 9 oz (87.8 kg)  BMI 29.43 kg/m2  SpO2 99% Alert, no apparent distress, sitting in chair Lungs-clear to auscultation Cardiac-regular rhythm Abdomen-soft, nontender, nondistended, incision clean dry and intact Legs-no edema   Discharge Instructions      Discharge Orders    Future Appointments: Provider: Department: Dept Phone: Center:   03/03/2012 1:30 PM Atilano Ina, MD,FACS [facility], [place] [phone] None     Future Orders Please Complete By Expires   Diet - low sodium heart healthy      Diet - low sodium heart healthy      Increase activity slowly      Increase activity slowly      Discharge instructions      Comments:   Resume coumadin next Monday December 12/16       Medication List     As of 02/25/2012  8:40 AM    TAKE these medications         albuterol 108 (90 BASE) MCG/ACT inhaler   Commonly known as: PROVENTIL HFA;VENTOLIN HFA   Inhale 2 puffs into the lungs every 6 (six) hours as needed.  For shortness of breath/wheezing      bumetanide 1 MG tablet   Commonly known as: BUMEX   Take 1 tablet (1 mg total) by mouth daily.      calcium carbonate 1250 MG tablet   Commonly known as: OS-CAL - dosed in mg of elemental calcium   Take 1 tablet by mouth 2 (two) times daily.      diltiazem 240 MG 24 hr capsule   Commonly known as: CARDIZEM CD   Take 240 mg by mouth daily.      feeding supplement Liqd   Take 237 mLs by mouth daily.      feeding supplement Liqd   Take 237 mLs by mouth 2 (two) times daily between meals.       magnesium hydroxide 400 MG/5ML suspension   Commonly known as: MILK OF MAGNESIA   Take 30 mLs by mouth at bedtime.      oxyCODONE-acetaminophen 5-325 MG per tablet   Commonly known as: PERCOCET/ROXICET   Take 1-2 tablets by mouth every 4 (four) hours as needed.      pantoprazole 40 MG tablet   Commonly known as: PROTONIX   Take 40 mg by mouth daily.      polysaccharide iron 150 MG capsule   Generic drug: iron polysaccharides   Take 150 mg by mouth every morning.      potassium chloride SA 20 MEQ tablet   Commonly known as: K-DUR,KLOR-CON   Take 1 tablet (20 mEq total) by mouth daily.      PROLIA 60 MG/ML Soln injection   Generic drug: denosumab   Inject 60 mg into the skin every 6 (six) months.      ramipril 10 MG capsule   Commonly known as: ALTACE   Take 10 mg by mouth daily.      sotalol 160 MG tablet   Commonly known as: BETAPACE   Take 1 tablet (160 mg total) by mouth 2 (two) times daily.      warfarin 3 MG tablet   Commonly known as: COUMADIN   Take 1-2 tablets (3-6 mg total) by mouth daily. RESUME NEXT Monday - 02/29/12; Take 1 tab on Monday, Wednesday, and Friday, then 2 tabs all other days        Follow-up Information    Follow up with Atilano Ina, MD,FACS. On 03/03/2012. (1:30 PM)    Contact information:   [address] [place] [address] [phone]           The results of significant diagnostics from this hospitalization (including imaging, microbiology, ancillary and laboratory) are listed below for reference.    Significant Diagnostic Studies: No results found.  Microbiology: Recent Results (from the past 240 hour(s))  SURGICAL PCR SCREEN     Status: Normal   Collection Time   02/19/12  7:31 AM      Component Value Range Status Comment   MRSA, PCR NEGATIVE  NEGATIVE Final    Staphylococcus aureus NEGATIVE  NEGATIVE Final      Labs: Basic Metabolic Panel:  Lab 02/24/12 0347 02/23/12 0625 02/21/12 0524 02/20/12 0610  02/19/12 0715  NA 139 139 137 137 140  K 3.4* 3.5 3.9 3.6 4.2  CL 105 106 106 103 101  CO2 27 24 25 26 29   GLUCOSE 94 132* 145* 163* 126*  BUN 22 17 11 15 18   CREATININE 0.93 0.77 0.86 1.04 1.15*  CALCIUM 8.0* 7.9* 7.5* 7.5* 9.1  MG -- -- -- -- --  PHOS -- -- -- -- --   Liver Function Tests:  Lab 02/19/12 0715  AST 20  ALT 13  ALKPHOS 51  BILITOT 0.6  PROT 7.2  ALBUMIN 3.6  CBC:  Lab 02/25/12 0510 02/24/12 0520 02/23/12 0625 02/22/12 0420 02/21/12 0524 02/19/12 0715  WBC 7.2 6.9 8.0 9.9 12.2* --  NEUTROABS -- -- -- -- -- 5.0  HGB 7.9* 7.8* 9.0* 8.5* 6.5* --  HCT 25.8* 25.7* 29.1* 26.8* 22.1* --  MCV 76.6* 76.0* 75.6* 75.9* 73.7* --  PLT 254 236 257 231 245 --    Principal Problem:  *Cancer of right colon -02/2012 Active Problems:  Pacemaker-Medtronic  Hypertension  Atrial fibrillation  Anemia  Tachycardia-bradycardia   Time coordinating discharge: 20 minutes  Signed:  Atilano Ina, MD [facility], [place] [phone] 02/25/2012, 8:40 AM

## 2012-02-25 NOTE — Progress Notes (Signed)
DC TO [facility] BY AMBULANCE, REPORT CALLED TO SHEMEKA.

## 2012-02-25 NOTE — Progress Notes (Signed)
Clinical Social Work  CSW faxed [employer] and medications to [facility]. SNF agreeable to admission today. CSW informed patient and dtr who request ambulance transport to SNF. CSW informed RN who is agreeable to dc. CSW prepared dc packet and coordinated transportation via [facility]. CSW is signing off.  [place], [place] [phone] (Coverage for Rozetta Nunnery)

## 2012-02-25 NOTE — ED Notes (Signed)
Patient sent from [facility] via wheelchair with c/o blurred vision, slurred speech and "mouth doesn't look right."

## 2012-02-26 ENCOUNTER — Telehealth (INDEPENDENT_AMBULATORY_CARE_PROVIDER_SITE_OTHER): Payer: Self-pay | Admitting: General Surgery

## 2012-02-26 ENCOUNTER — Inpatient Hospital Stay (HOSPITAL_COMMUNITY): Admit: 2012-02-26 | Discharge: 2012-02-26 | Disposition: A | Discharge status: Home / Self Care | Payer: Medicare Other

## 2012-02-26 DIAGNOSIS — I639 Cerebral infarction, unspecified: Secondary | ICD-10-CM | POA: Diagnosis present

## 2012-02-26 DIAGNOSIS — C187 Malignant neoplasm of sigmoid colon: Secondary | ICD-10-CM

## 2012-02-26 DIAGNOSIS — R739 Hyperglycemia, unspecified: Secondary | ICD-10-CM | POA: Diagnosis present

## 2012-02-26 DIAGNOSIS — I517 Cardiomegaly: Secondary | ICD-10-CM

## 2012-02-26 LAB — HEMOGLOBIN A1C: Hgb A1c MFr Bld: 5.8 % — ABNORMAL HIGH (ref <5.7)

## 2012-02-26 LAB — GLUCOSE, CAPILLARY
Glucose-Capillary: 94 mg/dL (ref 70–99)
Glucose-Capillary: 110 mg/dL — ABNORMAL HIGH (ref 70–99)
Glucose-Capillary: 148 mg/dL — ABNORMAL HIGH (ref 70–99)

## 2012-02-26 LAB — LIPID PANEL
HDL: 43 mg/dL (ref >39)
LDL Cholesterol: 61 mg/dL (ref 0–99)
VLDL: 20 mg/dL (ref 0–40)

## 2012-02-26 LAB — COMPREHENSIVE METABOLIC PANEL
ALT: 13 U/L (ref 0–35)
AST: 12 U/L (ref 0–37)
Albumin: 2.6 g/dL — ABNORMAL LOW (ref 3.5–5.2)
Alkaline Phosphatase: 48 U/L (ref 39–117)
BUN: 20 mg/dL (ref 6–23)
Chloride: 108 mEq/L (ref 96–112)
Potassium: 3.6 mEq/L (ref 3.5–5.1)
Sodium: 143 mEq/L (ref 135–145)
Total Bilirubin: 0.2 mg/dL — ABNORMAL LOW (ref 0.3–1.2)
Total Protein: 5.9 g/dL — ABNORMAL LOW (ref 6.0–8.3)

## 2012-02-26 LAB — CBC
HCT: 25.9 % — ABNORMAL LOW (ref 36.0–46.0)
MCHC: 30.9 g/dL (ref 30.0–36.0)
RDW: 21.9 % — ABNORMAL HIGH (ref 11.5–15.5)
WBC: 7.4 10*3/uL (ref 4.0–10.5)

## 2012-02-26 MED ORDER — WARFARIN - PHARMACIST DOSING INPATIENT: Freq: Every day | Status: DC

## 2012-02-26 MED ORDER — SODIUM CHLORIDE 0.9 % IV SOLN
INTRAVENOUS | Status: DC
  Administered 2012-02-26: 02:00:00 via INTRAVENOUS
  Filled 2012-02-26 (×2): qty 1000

## 2012-02-26 MED ORDER — ENOXAPARIN SODIUM 30 MG/0.3ML ~~LOC~~ SOLN
30.0000 mg | SUBCUTANEOUS | Status: DC
  Administered 2012-02-26 – 2012-02-29 (×4): 30 mg via SUBCUTANEOUS
  Filled 2012-02-26 (×4): qty 0.3

## 2012-02-26 MED ORDER — INSULIN ASPART 100 UNIT/ML ~~LOC~~ SOLN
0.0000 [IU] | SUBCUTANEOUS | Status: DC
  Administered 2012-02-26 – 2012-02-28 (×2): 1 [IU] via SUBCUTANEOUS
  Administered 2012-02-29: 2 [IU] via SUBCUTANEOUS

## 2012-02-26 MED ORDER — ASPIRIN 300 MG RE SUPP
300.0000 mg | Freq: Every day | RECTAL | Status: DC
  Filled 2012-02-26 (×3): qty 1

## 2012-02-26 MED ORDER — SODIUM CHLORIDE 0.9 % IV SOLN
INTRAVENOUS | Status: DC
  Filled 2012-02-26 (×2): qty 1000

## 2012-02-26 MED ORDER — ONDANSETRON HCL 4 MG/2ML IJ SOLN: 4.0000 mg | INTRAMUSCULAR | Status: DC | PRN

## 2012-02-26 MED ORDER — WARFARIN SODIUM 6 MG PO TABS
6.0000 mg | ORAL_TABLET | Freq: Once | ORAL | Status: AC
  Administered 2012-02-26: 6 mg via ORAL
  Filled 2012-02-26: qty 1

## 2012-02-26 MED ORDER — POTASSIUM CHLORIDE IN NACL 20-0.9 MEQ/L-% IV SOLN
INTRAVENOUS | Status: DC
  Administered 2012-02-26 – 2012-02-27 (×2): via INTRAVENOUS

## 2012-02-26 MED ORDER — MAGNESIUM HYDROXIDE 400 MG/5ML PO SUSP
30.0000 mL | Freq: Every day | ORAL | Status: DC | PRN
  Administered 2012-02-26 – 2012-02-27 (×3): 30 mL via ORAL
  Filled 2012-02-26 (×3): qty 30

## 2012-02-26 MED ORDER — ASPIRIN 325 MG PO TABS
325.0000 mg | ORAL_TABLET | Freq: Every day | ORAL | Status: DC
  Administered 2012-02-26 – 2012-02-29 (×4): 325 mg via ORAL
  Filled 2012-02-26 (×4): qty 1

## 2012-02-26 NOTE — Progress Notes (Signed)
UR Chart Review Completed  

## 2012-02-26 NOTE — Progress Notes (Addendum)
     Subjective: This lady was admitted yesterday with possible stroke. She feels much improved this morning and feels back to normal. There is no weakness in any limb and her speech is normal.           Physical Exam: Blood pressure 162/70, pulse 107, temperature 98 F (36.7 C), temperature source Axillary, resp. rate 17, height 5\' 6"  (1.676 m), weight 87.54 kg (192 lb 15.9 oz), SpO2 98.00%. She looks systemically well. She is alert and orientated. There is no facial asymmetry. She is moving both upper arms and both lower limbs. Heart sounds are present and that she has a resting tachycardia, seems to be irregular. Abdomen is soft and nontender. Lung fields are clear.   Investigations:  Recent Results (from the past 240 hour(s))  SURGICAL PCR SCREEN     Status: Normal   Collection Time   02/19/12  7:31 AM      Component Value Range Status Comment   MRSA, PCR NEGATIVE  NEGATIVE Final    Staphylococcus aureus NEGATIVE  NEGATIVE Final      Basic Metabolic Panel:  [facility] 02/26/12 0514 02/25/12 2010  NA 143 140  K 3.6 3.8  CL 108 105  CO2 29 27  GLUCOSE 106* 193*  BUN 20 24*  CREATININE 0.88 1.01  CALCIUM 8.5 8.8  MG -- --  PHOS -- --   Liver Function Tests:  [facility] 02/26/12 0514 02/25/12 2010  AST 12 16  ALT 13 16  ALKPHOS 48 57  BILITOT 0.2* 0.2*  PROT 5.9* 6.3  ALBUMIN 2.6* 2.7*     CBC:  Basename 02/26/12 0514 02/25/12 2010  WBC 7.4 6.7  NEUTROABS -- 4.3  HGB 8.0* 8.6*  HCT 25.9* 28.2*  MCV 78.5 77.9*  PLT 250 308    Ct Head Wo Contrast  02/25/2012  *RADIOLOGY REPORT*  Clinical Data: Blurry vision.  Possible stroke.  Left sided facial droop.  CT HEAD WITHOUT CONTRAST  Technique:  Contiguous axial images were obtained from the base of the skull through the vertex without contrast.  Comparison: None.  Findings: No mass lesion, mass effect, midline shift, hydrocephalus, hemorrhage.  No acute territorial cortical ischemia/infarct. Atrophy and  chronic ischemic white matter disease is present.  Intracranial atherosclerosis is present.  Calvarium intact.  IMPRESSION: Atrophy and chronic ischemic white matter disease without acute intracranial abnormality.   Original Report Authenticated By: Andreas Newport, M.D.       Medications: I have reviewed the patient's current medications.  Impression: 1. Possible TIA. 2. Atrial fibrillation. 3. Status post pacemaker for sick sinus syndrome. 4. Colon cancer, status post sigmoid colectomy recently. 5. Hypertension. 6. Anemia, related to blood loss from recent surgery. Stable hemoglobin.     Plan: 1. Reduce IV fluids to [facility]. 2. Start a diet. 3. Mobilize with physical therapy. 4. Repeat CT scan tomorrow to see if she actually had a small CVA. 5. Restart anticoagulation with warfarin. 6. Appreciate neurology consultation today. 7. Patient can move to the telemetry floor.     LOS: 1 day   Wilson Singer Pager [phone]  02/26/2012, 7:29 AM

## 2012-02-26 NOTE — Clinical Social Work Psychosocial (Signed)
    Clinical Social Work Department BRIEF PSYCHOSOCIAL ASSESSMENT 02/26/2012  Patient:  [name], [name]     Account Number:  [id number]     Admit date:  02/25/2012  Clinical Social Worker:  Santa Genera, CLINICAL SOCIAL WORKER  Date/Time:  02/26/2012 12:30 PM  Referred by:  CSW  Date Referred:  02/26/2012 Referred for  SNF Placement   Other Referral:   Interview type:  Other - See comment Other interview type:   Spoke w facility, left messages for family on phone numbers provided on face sheet.    PSYCHOSOCIAL DATA Living Status:  FACILITY Admitted from facility:  [facility] Level of care:   Primary support name:  Shereen Marton Primary support relationship to patient:  CHILD, ADULT Degree of support available:   Only able to contact Sharol Given, daughter, by phone    CURRENT CONCERNS Current Concerns  Post-Acute Placement   Other Concerns:    SOCIAL WORK ASSESSMENT / PLAN CSW unable to assess patient due to medical treatments and evaluations in progress.  Spoke w Admissions at [facility], per [place], facility is willing to take patient back at discharge pending bed availability and family paying bed hold if necessary.  Patient had prior stay at [facility]- March [facility].    Attempted to call all numbers on facesheet, was able to reach daughter Graciela Husbands.  Daughter lives on [place], was in [place] during patient's surgery and [facility] stay, but just returned to home yesterday.  Per daughter, patient had surgery for colon cancern and tolerated procedure well.  MD wanted patient to have week of rehab at [facility] post surgery before returning home. Patient lives alone and is fully independent, driving own car.  Has some minimal in-home help, but apparently managed ADLs without assistance.  Active in family and community activities.    Family would like patient to return to [facility] at discharge.  Encouraged daughter to contact Keri, Admissions, to discuss  bed hold process.  Daughter agreed to do so.  Daughter given contact numbers for [facility] in order to request progress updates on mother.   Assessment/plan status:  Psychosocial Support/Ongoing Assessment of Needs Other assessment/ plan:   Information/referral to community resources:   None needed at this time    PATIENT'S/FAMILY'S RESPONSE TO PLAN OF CARE: Appreciative   Santa Genera, LCSW Clinical Social Worker [phone])

## 2012-02-26 NOTE — Progress Notes (Signed)
INITIAL NUTRITION ASSESSMENT  DOCUMENTATION CODES Per approved criteria  -Obesity Unspecified    NUTRITION DIAGNOSIS: None at this time  Goal: Pt to meet >/= 90% of their estimated nutrition needs; not met  Monitor:  Meal percentage intake, labs, skin assessments  Reason for Assessment: Low Braden   78 y.o. female  Admitting Dx: <principal problem not specified>  ASSESSMENT: Pt reports good appetite. Her hx shows 10# (4.5kg wt gain) over past 2 months. Braden Score=12 earlier today likely due to pt diet no fully advanced at the time. Her score has been reassessed and significantly increased. Skin assessments reviewed. No additional nutrition interventions recommended at this time.  Height: Ht Readings from Last 1 Encounters:  02/25/12 5\' 6"  (1.676 m)   Weight: Wt Readings from Last 1 Encounters:  02/25/12 192 lb 15.9 oz (87.54 kg)   Ideal Body Weight: 130# (59 kg)  % Ideal Body Weight: 147%  Wt Readings from Last 10 Encounters:  02/25/12 192 lb 15.9 oz (87.54 kg)  02/22/12 193 lb 9 oz (87.8 kg)  02/22/12 193 lb 9 oz (87.8 kg)  12/31/11 183 lb (83.008 kg)  12/23/11 185 lb 14.4 oz (84.324 kg)  12/23/11 185 lb 14.4 oz (84.324 kg)  11/04/11 178 lb (80.74 kg)  08/04/11 170 lb (77.111 kg)  08/04/11 170 lb (77.111 kg)  07/29/11 175 lb 12.8 oz (79.742 kg)   Usual Body Weight: 180#  % Usual Body Weight: 106%  BMI:  Body mass index is 31.15 kg/(m^2). Obesity Class I  Estimated Nutritional Needs: Kcal: 147% Protein: 80-90 gr Fluid: 1 ml/kcal  Skin:no issues noted  Diet Order: Full Liquid- po 100%  EDUCATION NEEDS: -No education needs identified at this time   Intake/Output Summary (Last 24 hours) at 02/26/12 1417 Last data filed at 02/26/12 1100  Gross per 24 hour  Intake   1105 ml  Output   1100 ml  Net      5 ml   Last BM: PTA  Labs:   Lab 02/26/12 0514 02/25/12 2010 02/24/12 0520  NA 143 140 139  K 3.6 3.8 3.4*  CL 108 105 105  CO2 29 27 27    BUN 20 24* 22  CREATININE 0.88 1.01 0.93  CALCIUM 8.5 8.8 8.0*  MG -- -- --  PHOS -- -- --  GLUCOSE 106* 193* 94    CBG (last 3)   Basename 02/26/12 1145 02/26/12 0747 02/26/12 0413  GLUCAP 110* 94 128*    Scheduled Meds:   . aspirin  300 mg Rectal Daily   Or  . aspirin  325 mg Oral Daily  . enoxaparin (LOVENOX) injection  30 mg Subcutaneous Q24H  . insulin aspart  0-9 Units Subcutaneous Q4H  . warfarin  6 mg Oral ONCE-1800  . Warfarin - Pharmacist Dosing Inpatient   Does not apply q1800   Continuous Infusions:   . 0.9 % NaCl with KCl 20 mEq / L 10 mL/hr at 02/26/12 1100    Past Medical History  Diagnosis Date  . Diastolic heart failure   . Ovarian cancer   . Colon cancer   . Arthritis   . History of Clostridium difficile infection     in Jan 2013  . Pacemaker   . History of cellulitis and abscess   . Hypertension     takes Ramipril,Diltiazem daily  . Atrial fibrillation     takes Coumadin daily  . Tachy-brady syndrome 2008    s/p PPM implantation by Dr Gifford Shave takes  Betapace daily  . Osteoporosis     takes Prolia every 6months injections  . Shortness of breath     with exertion;Albuterol prn  . Bruises easily     takes COumadin daily  . GERD (gastroesophageal reflux disease)     takes Protonix daily  . Constipation     Milk of Mag nightly  . Anemia     takes Iron daily    Past Surgical History  Procedure Date  . Abdominal hysterectomy   . Oophorectomy   . Colonoscopy 03/30/2011    Procedure: COLONOSCOPY;  Surgeon: Barrie Folk, MD;  Location: [facility] ENDOSCOPY;  Service: Endoscopy;  Laterality: N/A;  . Esophagogastroduodenoscopy 03/30/2011    Procedure: ESOPHAGOGASTRODUODENOSCOPY (EGD);  Surgeon: Barrie Folk, MD;  Location: [facility] ENDOSCOPY;  Service: Endoscopy;  Laterality: N/A;  . Colon resection 04/03/2011    Procedure: COLON RESECTION LAPAROSCOPIC;  Surgeon: Atilano Ina, MD;  Location: [facility] OR;  Service: General;  Laterality: N/A;  Laparoscopic,  turned open sigmoid colon resection, lysis of adhesions x 2.5 hours, rigid proctoscopy.  . Pacemaker placement 2008    MDT EnRhythm implanted by Dr Amil Amen, generator change (MDT Adapta L) by Dr Johney Frame 08/04/11  . Colonoscopy 12/24/2011    Procedure: COLONOSCOPY;  Surgeon: Malissa Hippo, MD;  Location: [facility] ENDO SUITE;  Service: Endoscopy;  Laterality: N/A;  . Bilateral cataracts   . Battery changed in pacemaker   . Laparoscopic right hemi colectomy 02/19/2012    Procedure: LAPAROSCOPIC RIGHT HEMI COLECTOMY;  Surgeon: Atilano Ina, MD,FACS;  Location: [facility] OR;  Service: General;  Laterality: Right;  attempted laparoscopic assisted right hemi colectomy converted to open.  . Partial colectomy 02/19/2012    Procedure: PARTIAL COLECTOMY;  Surgeon: Atilano Ina, MD,FACS;  Location: [facility] OR;  Service: General;  Laterality: Right;  . Lysis of adhesion 02/19/2012    Procedure: LYSIS OF ADHESION;  Surgeon: Atilano Ina, MD,FACS;  Location: [facility] OR;  Service: General;  Laterality: N/A;      #[phone]

## 2012-02-26 NOTE — Progress Notes (Signed)
PT Cancellation Note  Patient Details Name: [name] MRN: [id number] DOB: [birth year]   Cancelled Treatment:    Reason Eval/Treat Not Completed: Patient's level of consciousness Pt had a PT consult ordered for 02-27-12, however Dr. Karilyn Cota stated that we could try to do the eval today.  Per Dr. Gerilyn Pilgrim, pt's deficits appear to be more cognitive and speech related.  RN states that pt has not had any difficulty transferring to [facility].  Currently, pt is so drowsy that I cannot keep her awake to do an eval.  We will try again tomorrow.  [name] 02/26/2012, 11:18 AM

## 2012-02-26 NOTE — Evaluation (Signed)
Occupational Therapy Evaluation Patient Details Name: [name] MRN: [id number] DOB: [birth year] Today's Date: 02/26/2012 Time: 4098-1191 OT Time Calculation (min): 20 min  OT Assessment / Plan / Recommendation Clinical Impression  Patient is a 78 y/o female s/p Stroke presenting to acute OT with all education complete. Family member present during eval. Patient has performing at baseline. No physical deficits noted. Patient is only experiencing word finding difficulties at times which Speech is treating. At this time, patient does not require OT services; will sign off.    OT Assessment  Patient does not need any further OT services    Follow Up Recommendations  No OT follow up       Equipment Recommendations  None recommended by OT          Precautions / Restrictions Precautions Precautions: None   Pertinent Vitals/Pain No complaints.    ADL  Toilet Transfer: Performed;Min guard Toilet Transfer Method: Stand pivot Transfers/Ambulation Related to ADLs: Patient transfers at a Min guard to Supervision level without device.      Visit Information  Last OT Received On: 02/26/12 Assistance Needed: +1    Subjective Data  Subjective: "I'm feeling fine." Patient Stated Goal: To have a BM.   Prior Functioning     Home Living Lives With: Alone Available Help at Discharge: Family;Available PRN/intermittently Type of Home: House Home Access: Stairs to enter (in the back. 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 A/ROM WFL in all ranges.  RUE Sensation: WFL - Light Touch;WFL - Proprioception RUE Coordination: WFL - gross/fine motor;WFL - gross motor Left Upper Extremity Assessment LUE ROM/Strength/Tone: Within functional levels (MMT: 4/5. A/ROM WFL in all ranges. ) LUE Sensation: WFL - Light Touch;WFL - Proprioception LUE Coordination: WFL - gross/fine motor;WFL - gross motor     Mobility Bed Mobility Bed Mobility: Sit to Supine Sit to Supine: 4: Min assist;HOB flat Transfers Transfers: Sit to Stand;Stand to Sit Sit to Stand: 4: Min guard;From elevated surface;From toilet Stand to Sit: 4: Min guard;With upper extremity assist;To bed              End of Session OT - End of Session Activity Tolerance: Patient tolerated treatment well Patient left: in bed;with call bell/phone within reach;with family/visitor present;with nursing in room    [facility], OTR/L 02/26/2012, 2:58 PM

## 2012-02-26 NOTE — Telephone Encounter (Signed)
Spoke with son to get update regarding [name]'s condition after possible CVA last evening. Emotional support given. Physically recovered. Still a little foggy regarding the event.

## 2012-02-26 NOTE — Consult Note (Signed)
[facility] Pacey Willadsen A. Gerilyn Pilgrim, MD     [email or web address]          [name] is an 78 y.o. female.   Assessment/Plan: 1. Acute confusion likely due to the acute onset of a left operculum infarct. Risk factor is most significant for atrial fibrillation. Other risk factors include age and hypertension. The patient has been restarted on her Coumadin. The stroke is likely a moderate-sized stroke and the risk of hemorrhagic transformation seems low to moderate. This seems okay therefore. She is also on DVT prophylaxis with Lovenox. She also is on aspirin. Aspirin can be discontinued once she is therapeutic on warfarin. I will obtain a repeat head CT scan to assess the size of the infarct and also obtain an EEG.  The patient is a 78 year old white female who recently underwent a hemicolectomy. She does have a history of atrial fibrillation but this was discontinued as she had a drop in her hematocrit postoperatively. She was to have this restarted but unfortunately she presented with the acute onset of altered mental status, slurred speech and right-sided weakness. This happened on yesterday morning. She was taken to the emergency room and evaluated. Initial head CT scan was negative. The hospitalist notes indicate that she was noted to have right-sided weakness. I am talking right now with the daughter-in-law who reports that she is improved rather significantly regarding her facial weakness, confusion and slurring of her speech. During the detail evaluation below, the daughter-in-law relates that she is clearly not baseline however. The review of systems is limited given the confusion by the patient does not report any new problems. She denies headaches or fevers, chest pain or shortness of breath.  GENERAL: This is a pleasant obese lady who is in no acute distress. She is cooperative with the evaluation.  HEENT: This is normocephalic and atraumatic.  ABDOMEN: soft  EXTREMITIES: No  edema   BACK: This is unremarkable.  SKIN: Normal by inspection.    MENTAL STATUS: The patient is awake and alert. She is eating her breakfast by herself. She clearly has a language impairment having difficulties with comprehension. She is only oriented to name. She notes that she is in some type of facility but should not concern clear. She has significant problems none with comprehension but was finding words. She was able to name 2 out of 5 objects clearly but had difficulties with 2 more other objects have been paraphasic errors. She was able to name them with repeat testing. No dysarthria is observed.  CRANIAL NERVES: Pupils are equal, round and reactive to light and accomodation; extra ocular movements are full, there is no significant nystagmus; visual fields are full; upper and lower facial muscles are normal in strength and symmetric, there is no flattening of the nasolabial folds; tongue is midline; uvula is midline; shoulder elevation is normal.  MOTOR: There is subtle weakness of the upper and lower extremities proximally graded at 4+/5. Otherwise, there is no focal weakness. She has normal tone and bulk and strength throughout. There is no pronator drift.   COORDINATION: Left finger to nose is normal, right finger to nose is normal, No rest tremor; no intention tremor; no postural tremor; no bradykinesia.  REFLEXES: Deep tendon reflexes are symmetrical and normal. Plantar responses are flexor bilaterally.   SENSATION: Normal to light touch, and temperature.   Past Medical History  Diagnosis Date  . Diastolic heart failure   . Ovarian cancer   . Colon cancer   .  Arthritis   . History of Clostridium difficile infection     in Jan 2013  . Pacemaker   . History of cellulitis and abscess   . Hypertension     takes Ramipril,Diltiazem daily  . Atrial fibrillation     takes Coumadin daily  . Tachy-brady syndrome 2008    s/p PPM implantation by Dr Gifford Shave takes Betapace  daily  . Osteoporosis     takes Prolia every 6months injections  . Shortness of breath     with exertion;Albuterol prn  . Bruises easily     takes COumadin daily  . GERD (gastroesophageal reflux disease)     takes Protonix daily  . Constipation     Milk of Mag nightly  . Anemia     takes Iron daily    Past Surgical History  Procedure Date  . Abdominal hysterectomy   . Oophorectomy   . Colonoscopy 03/30/2011    Procedure: COLONOSCOPY;  Surgeon: Barrie Folk, MD;  Location: [facility] ENDOSCOPY;  Service: Endoscopy;  Laterality: N/A;  . Esophagogastroduodenoscopy 03/30/2011    Procedure: ESOPHAGOGASTRODUODENOSCOPY (EGD);  Surgeon: Barrie Folk, MD;  [facility] ENDOSCOPY;  Service: Endoscopy;  Laterality: N/A;  . Colon resection 04/03/2011    Procedure: COLON RESECTION LAPAROSCOPIC;  Surgeon: Atilano Ina, MD;  Location: [facility] OR;  Service: General;  Laterality: N/A;  Laparoscopic, turned open sigmoid colon resection, lysis of adhesions x 2.5 hours, rigid proctoscopy.  . Pacemaker placement 2008    MDT EnRhythm implanted by Dr Amil Amen, generator change (MDT Adapta L) by Dr Johney Frame 08/04/11  . Colonoscopy 12/24/2011    Procedure: COLONOSCOPY;  Surgeon: Malissa Hippo, MD;  Location: [facility] ENDO SUITE;  Service: Endoscopy;  Laterality: N/A;  . Bilateral cataracts   . Battery changed in pacemaker   . Laparoscopic right hemi colectomy 02/19/2012    Procedure: LAPAROSCOPIC RIGHT HEMI COLECTOMY;  Surgeon: Atilano Ina, MD,FACS;  Location: [facility] OR;  Service: General;  Laterality: Right;  attempted laparoscopic assisted right hemi colectomy converted to open.  . Partial colectomy 02/19/2012    Procedure: PARTIAL COLECTOMY;  Surgeon: Atilano Ina, MD,FACS;  Location: [facility] OR;  Service: General;  Laterality: Right;  . Lysis of adhesion 02/19/2012    Procedure: LYSIS OF ADHESION;  Surgeon: Atilano Ina, MD,FACS;  Location: [facility] OR;  Service: General;  Laterality: N/A;    Family History  Problem Relation Age of  Onset  . Anesthesia problems Neg Hx   . Hypotension Neg Hx   . Malignant hyperthermia Neg Hx   . Pseudochol deficiency Neg Hx   . Cancer Mother     ovarian    Social History:  reports that she has never smoked. She has never used smokeless tobacco. She reports that she does not drink alcohol or use illicit drugs.  Allergies:  Allergies  Allergen Reactions  . Lasix (Furosemide) Swelling  . Penicillins Nausea And Vomiting and Swelling    Lip swelling and redness  . Sulfa Antibiotics Rash    Medications:  Prior to Admission medications   Medication Sig Start Date End Date Taking? Authorizing Provider  albuterol (PROVENTIL HFA;VENTOLIN HFA) 108 (90 BASE) MCG/ACT inhaler Inhale 2 puffs into the lungs every 6 (six) hours as needed. For shortness of breath/wheezing 06/10/11 06/09/12 Yes Marinda Elk, MD  bumetanide (BUMEX) 1 MG tablet Take 1 tablet (1 mg total) by mouth daily. 06/10/11 06/09/12 Yes Marinda Elk, MD  calcium carbonate (OS-CAL - DOSED IN MG OF  ELEMENTAL CALCIUM) 1250 MG tablet Take 1 tablet by mouth 2 (two) times daily.   Yes Historical Provider, MD  diltiazem (CARDIZEM CD) 240 MG 24 hr capsule Take 240 mg by mouth daily.   Yes Historical Provider, MD  feeding supplement (ENSURE COMPLETE) LIQD Take 237 mLs by mouth 2 (two) times daily between meals. 02/25/12  Yes Atilano Ina, MD,FACS  magnesium hydroxide (MILK OF MAGNESIA) 400 MG/5ML suspension Take 30 mLs by mouth at bedtime.    Yes Historical Provider, MD  oxyCODONE-acetaminophen (PERCOCET/ROXICET) 5-325 MG per tablet Take 1-2 tablets by mouth every 4 (four) hours as needed. 02/25/12  Yes Atilano Ina, MD,FACS  pantoprazole (PROTONIX) 40 MG tablet Take 40 mg by mouth daily.   Yes Historical Provider, MD  polysaccharide iron (NIFEREX) 150 MG CAPS capsule Take 150 mg by mouth every morning.    Yes Historical Provider, MD  potassium chloride SA (K-DUR,KLOR-CON) 20 MEQ tablet Take 20 mEq by mouth daily. 06/10/11  06/09/12 Yes Marinda Elk, MD  ramipril (ALTACE) 10 MG capsule Take 10 mg by mouth daily.     Yes Historical Provider, MD  sotalol (BETAPACE) 160 MG tablet Take 1 tablet (160 mg total) by mouth 2 (two) times daily. 09/28/11  Yes Hillis Range, MD  denosumab (PROLIA) 60 MG/ML SOLN Inject 60 mg into the skin every 6 (six) months.     Historical Provider, MD  warfarin (COUMADIN) 3 MG tablet Take 1-2 tablets (3-6 mg total) by mouth daily. RESUME NEXT Monday - 02/29/12; Take 1 tab on Monday, Wednesday, and Friday, then 2 tabs all other days 02/25/12   Atilano Ina, MD,FACS   Scheduled Meds:   . aspirin  300 mg Rectal Daily   Or  . aspirin  325 mg Oral Daily  . enoxaparin (LOVENOX) injection  30 mg Subcutaneous Q24H  . insulin aspart  0-9 Units Subcutaneous Q4H  . warfarin  6 mg Oral ONCE-1800  . Warfarin - Pharmacist Dosing Inpatient   Does not apply q1800   Continuous Infusions:   . 0.9 % NaCl with KCl 20 mEq / L 10 mL/hr (02/26/12 0740)   PRN Meds:.ondansetron (ZOFRAN) IV   Blood pressure 162/70, pulse 107, temperature 98 F (36.7 C), temperature source Axillary, resp. rate 17, height 5\' 6"  (1.676 m), weight 87.54 kg (192 lb 15.9 oz), SpO2 98.00%.   Results for orders placed during the hospital encounter of 02/25/12 (from the past 48 hour(s))  PROTIME-INR     Status: Normal   Collection Time   02/25/12  8:10 PM      Component Value Range Comment   Prothrombin Time 14.7  11.6 - 15.2 seconds    INR 1.17  0.00 - 1.49   APTT     Status: Normal   Collection Time   02/25/12  8:10 PM      Component Value Range Comment   aPTT 28  24 - 37 seconds   CBC     Status: Abnormal   Collection Time   02/25/12  8:10 PM      Component Value Range Comment   WBC 6.7  4.0 - 10.5 K/uL    RBC 3.62 (*) 3.87 - 5.11 MIL/uL    Hemoglobin 8.6 (*) 12.0 - 15.0 g/dL    HCT 16.1 (*) 09.6 - 46.0 %    MCV 77.9 (*) 78.0 - 100.0 fL    MCH 23.8 (*) 26.0 - 34.0 pg    MCHC 30.5  30.0 -  36.0 g/dL    RDW  13.0 (*) 86.5 - 15.5 %    Platelets 308  150 - 400 K/uL   DIFFERENTIAL     Status: Normal   Collection Time   02/25/12  8:10 PM      Component Value Range Comment   Neutrophils Relative 64  43 - 77 % SMEAR STAINED AND AVAILABLE FOR REVIEW   Neutro Abs 4.3  1.7 - 7.7 K/uL    Lymphocytes Relative 19  12 - 46 %    Lymphs Abs 1.3  0.7 - 4.0 K/uL    Monocytes Relative 12  3 - 12 %    Monocytes Absolute 0.8  0.1 - 1.0 K/uL    Eosinophils Relative 5  0 - 5 %    Eosinophils Absolute 0.3  0.0 - 0.7 K/uL    Basophils Relative 1  0 - 1 %    Basophils Absolute 0.0  0.0 - 0.1 K/uL   COMPREHENSIVE METABOLIC PANEL     Status: Abnormal   Collection Time   02/25/12  8:10 PM      Component Value Range Comment   Sodium 140  135 - 145 mEq/L    Potassium 3.8  3.5 - 5.1 mEq/L    Chloride 105  96 - 112 mEq/L    CO2 27  19 - 32 mEq/L    Glucose, Bld 193 (*) 70 - 99 mg/dL    BUN 24 (*) 6 - 23 mg/dL    Creatinine, Ser 7.84  0.50 - 1.10 mg/dL    Calcium 8.8  8.4 - 69.6 mg/dL    Total Protein 6.3  6.0 - 8.3 g/dL    Albumin 2.7 (*) 3.5 - 5.2 g/dL    AST 16  0 - 37 U/L    ALT 16  0 - 35 U/L    Alkaline Phosphatase 57  39 - 117 U/L    Total Bilirubin 0.2 (*) 0.3 - 1.2 mg/dL    GFR calc non Af Amer 49 (*) >90 mL/min    GFR calc Af Amer 56 (*) >90 mL/min   TROPONIN I     Status: Normal   Collection Time   02/25/12  8:10 PM      Component Value Range Comment   Troponin I <0.30  <0.30 ng/mL   GLUCOSE, CAPILLARY     Status: Abnormal   Collection Time   02/25/12  8:58 PM      Component Value Range Comment   Glucose-Capillary 151 (*) 70 - 99 mg/dL   GLUCOSE, CAPILLARY     Status: Normal   Collection Time   02/26/12  1:29 AM      Component Value Range Comment   Glucose-Capillary 95  70 - 99 mg/dL   GLUCOSE, CAPILLARY     Status: Abnormal   Collection Time   02/26/12  4:13 AM      Component Value Range Comment   Glucose-Capillary 128 (*) 70 - 99 mg/dL    Comment 1 Documented in Chart      Comment 2  Notify RN     CBC     Status: Abnormal   Collection Time   02/26/12  5:14 AM      Component Value Range Comment   WBC 7.4  4.0 - 10.5 K/uL    RBC 3.30 (*) 3.87 - 5.11 MIL/uL    Hemoglobin 8.0 (*) 12.0 - 15.0 g/dL    HCT 29.5 (*) 28.4 - 46.0 %  MCV 78.5  78.0 - 100.0 fL    MCH 24.2 (*) 26.0 - 34.0 pg    MCHC 30.9  30.0 - 36.0 g/dL    RDW 16.1 (*) 09.6 - 15.5 %    Platelets 250  150 - 400 K/uL   COMPREHENSIVE METABOLIC PANEL     Status: Abnormal   Collection Time   02/26/12  5:14 AM      Component Value Range Comment   Sodium 143  135 - 145 mEq/L    Potassium 3.6  3.5 - 5.1 mEq/L    Chloride 108  96 - 112 mEq/L    CO2 29  19 - 32 mEq/L    Glucose, Bld 106 (*) 70 - 99 mg/dL    BUN 20  6 - 23 mg/dL    Creatinine, Ser 0.45  0.50 - 1.10 mg/dL    Calcium 8.5  8.4 - 40.9 mg/dL    Total Protein 5.9 (*) 6.0 - 8.3 g/dL    Albumin 2.6 (*) 3.5 - 5.2 g/dL    AST 12  0 - 37 U/L    ALT 13  0 - 35 U/L    Alkaline Phosphatase 48  39 - 117 U/L    Total Bilirubin 0.2 (*) 0.3 - 1.2 mg/dL    GFR calc non Af Amer 57 (*) >90 mL/min    GFR calc Af Amer 67 (*) >90 mL/min   LIPID PANEL     Status: Normal   Collection Time   02/26/12  5:19 AM      Component Value Range Comment   Cholesterol 124  0 - 200 mg/dL    Triglycerides 811  <914 mg/dL    HDL 43  >78 mg/dL    Total CHOL/HDL Ratio 2.9      VLDL 20  0 - 40 mg/dL    LDL Cholesterol 61  0 - 99 mg/dL   GLUCOSE, CAPILLARY     Status: Normal   Collection Time   02/26/12  7:47 AM      Component Value Range Comment   Glucose-Capillary 94  70 - 99 mg/dL     Ct Head Wo Contrast  02/25/2012  *RADIOLOGY REPORT*  Clinical Data: Blurry vision.  Possible stroke.  Left sided facial droop.  CT HEAD WITHOUT CONTRAST  Technique:  Contiguous axial images were obtained from the base of the skull through the vertex without contrast.  Comparison: None.  Findings: No mass lesion, mass effect, midline shift, hydrocephalus, hemorrhage.  No acute territorial  cortical ischemia/infarct. Atrophy and chronic ischemic white matter disease is present.  Intracranial atherosclerosis is present.  Calvarium intact.  IMPRESSION: Atrophy and chronic ischemic white matter disease without acute intracranial abnormality.   Original Report Authenticated By: Andreas Newport, M.D.         Becci Batty A. Gerilyn Pilgrim, M.D.  Diplomate, Biomedical engineer of Psychiatry and Neurology ( Neurology). 02/26/2012, 8:19 AM

## 2012-02-26 NOTE — H&P (Signed)
[facility] History and Physical  [name]  ZOX:[id number]  DOB: Aug 31, 1922   DOA: 02/25/2012   PCP:   Bennie Pierini, FNP   Chief Complaint:  Acute mental status change  HPI: [name] is an 78 y.o. female.   Caucasian discharged today from [facility] where she had right hemicolectomy for stage Ca colon; discharged to [facility]. Shortly after being admitted SNF her son noted her head to droop, jerking movements of her eyes, right facial droop, but speech. She was transported to [facility] emergency room within 15-30 minutes, CT scan of the head was negative, but because of her recent surgery she is not a candidate for TPA; hospitalist service was called to admit for presumed acute stroke.  Patient has A. Fib/ A. flutter and is maintained on Coumadin, but then was discontinued for surgery on December 6, and there has been a delay in restarting her Coumadin as she had an episode of a 2. drop in hemoglobin 4 days ago requiring transfusion of 2 units of packed cells. Her hemoglobin has been stable at around 8 since then. Coumadin is due to be restarted today.  Patient also has a pacemaker for arrhythmia.  Initially in the emergency room patient was alert and talking and passed a swallowing screen; but by the time of the hospitalist interview patient is again very drowsy and troponin right facial weakness is returned, she responds poorly to come arms and seems unable to speak.  There's been no history of fever or cough is been no history of passage of bloody or black stool there's been no nausea or vomiting  Rewiew of Systems:  Unable to obtain full review of systems due to patient's mental status.    Past Medical History  Diagnosis Date  . Diastolic heart failure   . Ovarian cancer   . Colon cancer   . Arthritis   . History of Clostridium difficile infection     in Jan 2013  . Pacemaker   . History of cellulitis and abscess   . Hypertension     takes  Ramipril,Diltiazem daily  . Atrial fibrillation     takes Coumadin daily  . Tachy-brady syndrome 2008    s/p PPM implantation by Dr Gifford Shave takes Betapace daily  . Osteoporosis     takes Prolia every 6months injections  . Shortness of breath     with exertion;Albuterol prn  . Bruises easily     takes COumadin daily  . GERD (gastroesophageal reflux disease)     takes Protonix daily  . Constipation     Milk of Mag nightly  . Anemia     takes Iron daily    Past Surgical History  Procedure Date  . Abdominal hysterectomy   . Oophorectomy   . Colonoscopy 03/30/2011    Procedure: COLONOSCOPY;  Surgeon: Barrie Folk, MD;  Location: [facility] ENDOSCOPY;  Service: Endoscopy;  Laterality: N/A;  . Esophagogastroduodenoscopy 03/30/2011    Procedure: ESOPHAGOGASTRODUODENOSCOPY (EGD);  Surgeon: Barrie Folk, MD;  Location: [facility] ENDOSCOPY;  Service: Endoscopy;  Laterality: N/A;  . Colon resection 04/03/2011    Procedure: COLON RESECTION LAPAROSCOPIC;  Surgeon: Atilano Ina, MD;  Location: [facility] OR;  Service: General;  Laterality: N/A;  Laparoscopic, turned open sigmoid colon resection, lysis of adhesions x 2.5 hours, rigid proctoscopy.  . Pacemaker placement 2008    MDT EnRhythm implanted by Dr Amil Amen, generator change (MDT Adapta L) by Dr Johney Frame 08/04/11  . Colonoscopy 12/24/2011  Procedure: COLONOSCOPY;  Surgeon: Malissa Hippo, MD;  Location: [facility] ENDO SUITE;  Service: Endoscopy;  Laterality: N/A;  . Bilateral cataracts   . Battery changed in pacemaker   . Laparoscopic right hemi colectomy 02/19/2012    Procedure: LAPAROSCOPIC RIGHT HEMI COLECTOMY;  Surgeon: Atilano Ina, MD,FACS;  Location: [facility] OR;  Service: General;  Laterality: Right;  attempted laparoscopic assisted right hemi colectomy converted to open.  . Partial colectomy 02/19/2012    Procedure: PARTIAL COLECTOMY;  Surgeon: Atilano Ina, MD,FACS;  Location: [facility] OR;  Service: General;  Laterality: Right;  . Lysis of adhesion 02/19/2012     Procedure: LYSIS OF ADHESION;  Surgeon: Atilano Ina, MD,FACS;  Location: [facility] OR;  Service: General;  Laterality: N/A;    Medications:  HOME MEDS: Prior to Admission medications   Medication Sig Start Date End Date Taking? Authorizing Provider  albuterol (PROVENTIL HFA;VENTOLIN HFA) 108 (90 BASE) MCG/ACT inhaler Inhale 2 puffs into the lungs every 6 (six) hours as needed. For shortness of breath/wheezing 06/10/11 06/09/12 Yes Marinda Elk, MD  bumetanide (BUMEX) 1 MG tablet Take 1 tablet (1 mg total) by mouth daily. 06/10/11 06/09/12 Yes Marinda Elk, MD  calcium carbonate (OS-CAL - DOSED IN MG OF ELEMENTAL CALCIUM) 1250 MG tablet Take 1 tablet by mouth 2 (two) times daily.   Yes Historical Provider, MD  diltiazem (CARDIZEM CD) 240 MG 24 hr capsule Take 240 mg by mouth daily.   Yes Historical Provider, MD  feeding supplement (ENSURE COMPLETE) LIQD Take 237 mLs by mouth 2 (two) times daily between meals. 02/25/12  Yes Atilano Ina, MD,FACS  magnesium hydroxide (MILK OF MAGNESIA) 400 MG/5ML suspension Take 30 mLs by mouth at bedtime.    Yes Historical Provider, MD  oxyCODONE-acetaminophen (PERCOCET/ROXICET) 5-325 MG per tablet Take 1-2 tablets by mouth every 4 (four) hours as needed. 02/25/12  Yes Atilano Ina, MD,FACS  pantoprazole (PROTONIX) 40 MG tablet Take 40 mg by mouth daily.   Yes Historical Provider, MD  polysaccharide iron (NIFEREX) 150 MG CAPS capsule Take 150 mg by mouth every morning.    Yes Historical Provider, MD  potassium chloride SA (K-DUR,KLOR-CON) 20 MEQ tablet Take 20 mEq by mouth daily. 06/10/11 06/09/12 Yes Marinda Elk, MD  ramipril (ALTACE) 10 MG capsule Take 10 mg by mouth daily.     Yes Historical Provider, MD  sotalol (BETAPACE) 160 MG tablet Take 1 tablet (160 mg total) by mouth 2 (two) times daily. 09/28/11  Yes Hillis Range, MD  denosumab (PROLIA) 60 MG/ML SOLN Inject 60 mg into the skin every 6 (six) months.     Historical Provider, MD  warfarin  (COUMADIN) 3 MG tablet Take 1-2 tablets (3-6 mg total) by mouth daily. RESUME NEXT Monday - 02/29/12; Take 1 tab on Monday, Wednesday, and Friday, then 2 tabs all other days 02/25/12   Atilano Ina, MD,FACS     Allergies:  Allergies  Allergen Reactions  . Lasix (Furosemide) Swelling  . Penicillins Nausea And Vomiting and Swelling    Lip swelling and redness  . Sulfa Antibiotics Rash    Social History:   reports that she has never smoked. She has never used smokeless tobacco. She reports that she does not drink alcohol or use illicit drugs.  Family History: Family History  Problem Relation Age of Onset  . Anesthesia problems Neg Hx   . Hypotension Neg Hx   . Malignant hyperthermia Neg Hx   . Pseudochol deficiency Neg  Hx   . Cancer Mother     ovarian     Physical Exam: Filed Vitals:   02/25/12 1947 02/25/12 2218 02/25/12 2354  BP: 145/65 121/53   Pulse: 72 60   Temp: 98.3 F (36.8 C)  98.3 F (36.8 C)  TempSrc: Oral  Axillary  Resp: 20 20   Height:   5\' 6"  (1.676 m)  Weight: 87.544 kg (193 lb)  87.54 kg (192 lb 15.9 oz)  SpO2: 100% 100%    Blood pressure 121/53, pulse 60, temperature 98.3 F (36.8 C), temperature source Axillary, resp. rate 20, height 5\' 6"  (1.676 m), weight 87.54 kg (192 lb 15.9 oz), SpO2 100.00%.  GEN:  Pleasant Drowsy poorly responsive Caucasian lady lying in the stretcher, mouth breathing ;  unable to be cooperative with exam PSYCH:  alert and oriented x1; keeps eyes closed  HEENT: Mucous membranes pink, dry  and anicteric;  thyromegaly or carotid bruit; no JVD; Breasts:: Not examined CHEST WALL: No tenderness CHEST: Normal respiration, clear to auscultation bilaterally HEART: Regular rate and rhythm;  1/6 systolic murmur  BACK: No kyphosis or scoliosis; no CVA tenderness ABDOMEN: Obese, soft non-tender; no masses, no organomegaly, normal abdominal bowel sounds; no intertriginous candida. Rectal Exam: Not done EXTREMITIES:  age-appropriate  arthropathy of the hands and knees; no edema; no ulcerations. Genitalia: not examined PULSES: 2+ and symmetric SKIN: Normal hydration no rash or ulceration CNS: Cranial nerves; surprisingly patient appears to have a right lower motor neuron weekness, in that absence of wrinkling of the right brow; she has ptosis of the left eyelid when asked to open and eyes; she mouths,but is unable to verbalize in attempting to answer questions. She lifts the left hand in response to command on but not the right;   Labs on Admission:  Basic Metabolic Panel:  Lab 02/25/12 9604 02/24/12 0520 02/23/12 0625 02/21/12 0524 02/20/12 0610  NA 140 139 139 137 137  K 3.8 3.4* 3.5 3.9 3.6  CL 105 105 106 106 103  CO2 27 27 24 25 26   GLUCOSE 193* 94 132* 145* 163*  BUN 24* 22 17 11 15   CREATININE 1.01 0.93 0.77 0.86 1.04  CALCIUM 8.8 8.0* 7.9* 7.5* 7.5*  MG -- -- -- -- --  PHOS -- -- -- -- --   Liver Function Tests:  Lab 02/25/12 2010 02/19/12 0715  AST 16 20  ALT 16 13  ALKPHOS 57 51  BILITOT 0.2* 0.6  PROT 6.3 7.2  ALBUMIN 2.7* 3.6   No results found for this basename: LIPASE:5,AMYLASE:5 in the last 168 hours No results found for this basename: AMMONIA:5 in the last 168 hours CBC:  Lab 02/25/12 2010 02/25/12 0510 02/24/12 0520 02/23/12 0625 02/22/12 0420 02/19/12 0715  WBC 6.7 7.2 6.9 8.0 9.9 --  NEUTROABS 4.3 -- -- -- -- 5.0  HGB 8.6* 7.9* 7.8* 9.0* 8.5* --  HCT 28.2* 25.8* 25.7* 29.1* 26.8* --  MCV 77.9* 76.6* 76.0* 75.6* 75.9* --  PLT 308 254 236 257 231 --   Cardiac Enzymes:  Lab 02/25/12 2010  CKTOTAL --  CKMB --  CKMBINDEX --  TROPONINI <0.30   BNP: No components found with this basename: POCBNP:5 D-dimer: No components found with this basename: D-DIMER:5 CBG:  Lab 02/25/12 2058  GLUCAP 151*    Radiological Exams on Admission: Ct Head Wo Contrast  02/25/2012  *RADIOLOGY REPORT*  Clinical Data: Blurry vision.  Possible stroke.  Left sided facial droop.  CT HEAD WITHOUT  CONTRAST  Technique:  Contiguous axial images were obtained from the base of the skull through the vertex without contrast.  Comparison: None.  Findings: No mass lesion, mass effect, midline shift, hydrocephalus, hemorrhage.  No acute territorial cortical ischemia/infarct. Atrophy and chronic ischemic white matter disease is present.  Intracranial atherosclerosis is present.  Calvarium intact.  IMPRESSION: Atrophy and chronic ischemic white matter disease without acute intracranial abnormality.   Original Report Authenticated By: Andreas Newport, M.D.     EKG: Independently reviewed.  episodically paced rhythm at 60 per minute, but sometimes spaces spike seems to disappear; no clear evidence of atrial fibrillation.   Assessment/Plan Present on Admission:  Acute CVA with encephalopathy  . Pacemaker-Medtronic . Hypertension . Atrial fibrillation . IBS (irritable bowel syndrome) . Anemia . Cancer of right colon -02/2012   PLAN: Patient was discussed with her surgeon Dr. Gaynelle Adu, reports no contraindication to anticoagulation from his side. Patient was discussed with on-call neural hospitalists Dr. Amada Jupiter, advises against anticoagulation until a peak CT a few days later rules out a large stroke.  Will observe patient in the step down unit because of mental status changes, assess and optimize stroke risk factors; DVT prophylaxis with Lovenox; aspirin for antiplatelet agent; Keep patient n.p.o. and hydrate with IV fluids; Check hemoglobin A1c  Carotid Doppler 2-D echo Speech therapy consult  Consult in the morning; Cardiology consult to double check pacemaker since sometime the spikes are missing.  Patient may need parenteral rate control medications while she is n.p.o.  Other plans as per orders.  Code Status: DNR Family Communication:  sounds M.D. and Loraine Leriche present at the interview and examination. TM is healthcare power of attorney telephone number is [phone],  [phone],, [phone]  Disposition Plan:  back to skilled nursing facility when stable   Critical care time: 60 minutes.   Kataleena Holsapple Nocturnist [facility] Pager [phone]   02/26/2012, 12:28 AM

## 2012-02-26 NOTE — Progress Notes (Signed)
ANTICOAGULATION CONSULT NOTE - Initial Consult  Pharmacy Consult for Coumadin Indication: atrial fibrillation  Allergies  Allergen Reactions  . Lasix (Furosemide) Swelling  . Penicillins Nausea And Vomiting and Swelling    Lip swelling and redness  . Sulfa Antibiotics Rash    Patient Measurements: Height: 5\' 6"  (167.6 cm) Weight: 192 lb 15.9 oz (87.54 kg) IBW/kg (Calculated) : 59.3   Vital Signs: Temp: 98 F (36.7 C) (12/13 0400) Temp src: Axillary (12/13 0400) BP: 162/70 mmHg (12/13 0600) Pulse Rate: 107  (12/13 0600)  Labs:  Basename 02/26/12 0514 02/25/12 2010 02/25/12 0510 02/24/12 0520  HGB 8.0* 8.6* -- --  HCT 25.9* 28.2* 25.8* --  PLT 250 308 254 --  APTT -- 28 34 44*  LABPROT -- 14.7 17.5* 21.6*  INR -- 1.17 1.48 1.96*  HEPARINUNFRC -- -- -- --  CREATININE 0.88 1.01 -- 0.93  CKTOTAL -- -- -- --  CKMB -- -- -- --  TROPONINI -- <0.30 -- --    Estimated Creatinine Clearance: 50.2 ml/min (by C-G formula based on Cr of 0.88).   Medical History: Past Medical History  Diagnosis Date  . Diastolic heart failure   . Ovarian cancer   . Colon cancer   . Arthritis   . History of Clostridium difficile infection     in Jan 2013  . Pacemaker   . History of cellulitis and abscess   . Hypertension     takes Ramipril,Diltiazem daily  . Atrial fibrillation     takes Coumadin daily  . Tachy-brady syndrome 2008    s/p PPM implantation by Dr Gifford Shave takes Betapace daily  . Osteoporosis     takes Prolia every 6months injections  . Shortness of breath     with exertion;Albuterol prn  . Bruises easily     takes COumadin daily  . GERD (gastroesophageal reflux disease)     takes Protonix daily  . Constipation     Milk of Mag nightly  . Anemia     takes Iron daily    Medications:  Scheduled:    . aspirin  300 mg Rectal Daily   Or  . aspirin  325 mg Oral Daily  . enoxaparin (LOVENOX) injection  30 mg Subcutaneous Q24H  . insulin aspart  0-9 Units  Subcutaneous Q4H    Assessment: 78 yo F on chronic warfarin 3mg  on MWF and 6mg  on TTSS for Afib.  Warfarin has been on hold since 02-19-12 for surgical procedure.  She has acute blood loss anemia which has been stable.  She was re-admitted from NH for stroke-like symptoms.   Plan to resume warfarin per MD request.   Goal of Therapy:  INR 2-3 Monitor platelets by anticoagulation protocol: Yes   Plan:  1) Coumadin 6mg  po x1 at 6pm 2) Daily INR 3) Overlap with Lovenox 30mg  sq daily per MD until INR>2  Deepti Gunawan, [facility] 02/26/2012,8:04 AM

## 2012-02-26 NOTE — Progress Notes (Signed)
*  PRELIMINARY RESULTS* Echocardiogram 2D Echocardiogram has been performed.  [name] 02/26/2012, 11:22 AM

## 2012-02-26 NOTE — Evaluation (Addendum)
Speech Language Pathology Evaluation Patient Details Name: [name] MRN: [id number] DOB: 13-Feb-1923 Today's Date: 02/26/2012 Time: 4782-9562 SLP Time Calculation (min): 36 min  Problem List:  Patient Active Problem List  Diagnosis  . Pacemaker-Medtronic  . Edema  . Hypertension  . Atrial fibrillation  . IBS (irritable bowel syndrome)  . Osteoporosis  . Atrial flutter with rapid ventricular response  . Ovarian cancer  . Anemia  . Cancer of sigmoid colon s/p sigmoid colectomy for T3N0 lesion on 1/18  . Tachycardia-bradycardia  . Cancer of right colon -02/2012  . Stroke  . Hyperglycemia   Past Medical History:  Past Medical History  Diagnosis Date  . Diastolic heart failure   . Ovarian cancer   . Colon cancer   . Arthritis   . History of Clostridium difficile infection     in Jan 2013  . Pacemaker   . History of cellulitis and abscess   . Hypertension     takes Ramipril,Diltiazem daily  . Atrial fibrillation     takes Coumadin daily  . Tachy-brady syndrome 2008    s/p PPM implantation by Dr Gifford Shave takes Betapace daily  . Osteoporosis     takes Prolia every 6months injections  . Shortness of breath     with exertion;Albuterol prn  . Bruises easily     takes COumadin daily  . GERD (gastroesophageal reflux disease)     takes Protonix daily  . Constipation     Milk of Mag nightly  . Anemia     takes Iron daily   Past Surgical History:  Past Surgical History  Procedure Date  . Abdominal hysterectomy   . Oophorectomy   . Colonoscopy 03/30/2011    Procedure: COLONOSCOPY;  Surgeon: Barrie Folk, MD;  Location: [facility] ENDOSCOPY;  Service: Endoscopy;  Laterality: N/A;  . Esophagogastroduodenoscopy 03/30/2011    Procedure: ESOPHAGOGASTRODUODENOSCOPY (EGD);  Surgeon: Barrie Folk, MD;  Location: [facility] ENDOSCOPY;  Service: Endoscopy;  Laterality: N/A;  . Colon resection 04/03/2011    Procedure: COLON RESECTION LAPAROSCOPIC;  Surgeon: Atilano Ina, MD;  Location: [facility]  OR;  Service: General;  Laterality: N/A;  Laparoscopic, turned open sigmoid colon resection, lysis of adhesions x 2.5 hours, rigid proctoscopy.  . Pacemaker placement 2008    MDT EnRhythm implanted by Dr Amil Amen, generator change (MDT Adapta L) by Dr Johney Frame 08/04/11  . Colonoscopy 12/24/2011    Procedure: COLONOSCOPY;  Surgeon: Malissa Hippo, MD;  Location: [facility] ENDO SUITE;  Service: Endoscopy;  Laterality: N/A;  . Bilateral cataracts   . Battery changed in pacemaker   . Laparoscopic right hemi colectomy 02/19/2012    Procedure: LAPAROSCOPIC RIGHT HEMI COLECTOMY;  Surgeon: Atilano Ina, MD,FACS;  Location: [facility] OR;  Service: General;  Laterality: Right;  attempted laparoscopic assisted right hemi colectomy converted to open.  . Partial colectomy 02/19/2012    Procedure: PARTIAL COLECTOMY;  Surgeon: Atilano Ina, MD,FACS;  Location: [facility] OR;  Service: General;  Laterality: Right;  . Lysis of adhesion 02/19/2012    Procedure: LYSIS OF ADHESION;  Surgeon: Atilano Ina, MD,FACS;  Location: [facility] OR;  Service: General;  Laterality: N/A;   HPI:  [name] is an 78 y.o. female.   Caucasian discharged today from [facility] where she had right hemicolectomy for stage Ca colon; discharged to [facility]. Shortly after being admitted SNF her son noted her head to droop, jerking movements of her eyes, right facial droop, but speech. She was transported  to [facility] emergency room within 15-30 minutes, CT scan of the head was negative, but because of her recent surgery she is not a candidate for TPA; hospitalist service was called to admit for presumed acute stroke.   Assessment / Plan / Recommendation Clinical Impression  Mild/moderate expressive aphasia characterized by anomia and circumlocution during conversation, decreased generational and confrontation naming, and verbal perseveration of previously discussed information which interferes with topic maintenance. Pt would benefit from skilled  speech/language therapy in acute setting (and at receiving facility) to maximize independence in communication. Pt was independent and lived alone prior to recent hospitalization.     SLP Assessment  Patient needs continued Speech Lanaguage Pathology Services    Follow Up Recommendations  Skilled Nursing facility;Outpatient SLP    Frequency and Duration min 3x week  1 week      SLP Goals  SLP Goals Potential to Achieve Goals: Good Progress/Goals/Alternative treatment plan discussed with pt/caregiver and they: Agree SLP Goal #1: Pt will increase naming of common objects to 90% acc with min phonemic cues. SLP Goal #2: Pt will be able to name family members with moderate assistance (written cues prn). SLP Goal #3: Pt will utilize word finding strategies in conversation with moderate cues when needed.  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, fashion/clothing))   Cognition  Overall Cognitive Status: Impaired Arousal/Alertness: Awake/alert Orientation Level: Oriented to person;Oriented to place;Oriented to situation Memory: Appears intact Awareness: Impaired Awareness Impairment: Emergent impairment Problem Solving: Impaired Problem Solving Impairment: Functional complex Executive Function: Self Monitoring;Self Correcting Self Monitoring: Impaired Self Monitoring Impairment: Verbal complex Self Correcting: Impaired Self Correcting Impairment: Verbal complex Behaviors: Perseveration Safety/Judgment: Appears intact    Comprehension  Auditory Comprehension Overall Auditory Comprehension: Impaired (should be futher assessed at SNF) Yes/No Questions: Within Functional Limits Commands: Within Functional Limits Conversation:  Simple Interfering Components: Processing speed EffectiveTechniques: Extra processing time Visual Recognition/Discrimination Discrimination: Within Function Limits Reading Comprehension Reading Status: Within funtional limits (Assessed with short sentences only)    Expression Expression Primary Mode of Expression: Verbal Verbal Expression Overall Verbal Expression: Impaired Initiation: No impairment Level of Generative/Spontaneous Verbalization: Conversation Repetition: Impaired Level of Impairment: Sentence level Naming: Impairment Responsive: 51-75% accurate Confrontation: Impaired Convergent: Not tested Divergent: 25-49% accurate Other Naming Comments: Pt anomic and substitutes vague/general words for intended word with little awareness Verbal Errors: Perseveration;Semantic paraphasias Pragmatics: No impairment Effective Techniques: Open ended questions;Semantic cues;Phonemic cues Non-Verbal Means of Communication: Not applicable Written Expression Dominant Hand: Right Written Expression: Not tested   Oral / Motor Oral Motor/Sensory Function Overall Oral Motor/Sensory Function: Appears within functional limits for tasks assessed (initially some right facial weakness) Motor Speech Overall Motor Speech: Appears within functional limits for tasks assessed Respiration: Within functional limits Phonation: Normal Resonance: Within functional limits Articulation: Within functional limitis Intelligibility: Intelligible Motor Planning: Witnin functional limits Motor Speech Errors: Not applicable   Thank you,  Havery Moros, CCC-SLP [phone]      Skyla Champagne 02/26/2012, 2:03 PM  Addendum: Pt passed RN swallow screen yesterday and was observed consuming lunch meal without difficulty.

## 2012-02-26 NOTE — Progress Notes (Signed)
Potable EEG completed at [facility]

## 2012-02-26 NOTE — Progress Notes (Signed)
Report given to Loletta Specter, RN. Patient ready for transfer to room 314.  No acute distress noted.  MOM given for c/o constipation.  Patient tolerating full liquids without difficulty.  Reminded patient and family member that diet can be advanced when patient wants solid food.

## 2012-02-27 ENCOUNTER — Inpatient Hospital Stay (HOSPITAL_COMMUNITY): Payer: Medicare Other

## 2012-02-27 DIAGNOSIS — I10 Essential (primary) hypertension: Secondary | ICD-10-CM

## 2012-02-27 DIAGNOSIS — C182 Malignant neoplasm of ascending colon: Secondary | ICD-10-CM

## 2012-02-27 LAB — PROTIME-INR
INR: 1.11 (ref 0.00–1.49)
Prothrombin Time: 14.2 seconds (ref 11.6–15.2)

## 2012-02-27 LAB — GLUCOSE, CAPILLARY
Glucose-Capillary: 123 mg/dL — ABNORMAL HIGH (ref 70–99)
Glucose-Capillary: 158 mg/dL — ABNORMAL HIGH (ref 70–99)
Glucose-Capillary: 147 mg/dL — ABNORMAL HIGH (ref 70–99)

## 2012-02-27 LAB — BASIC METABOLIC PANEL
CO2: 29 mEq/L (ref 19–32)
Chloride: 104 mEq/L (ref 96–112)
GFR calc Af Amer: 85 mL/min — ABNORMAL LOW (ref >90)
Potassium: 4.6 mEq/L (ref 3.5–5.1)
Sodium: 137 mEq/L (ref 135–145)

## 2012-02-27 LAB — CBC
MCV: 77.9 fL — ABNORMAL LOW (ref 78.0–100.0)
Platelets: 336 10*3/uL (ref 150–400)
RBC: 3.84 MIL/uL — ABNORMAL LOW (ref 3.87–5.11)
WBC: 8.4 10*3/uL (ref 4.0–10.5)

## 2012-02-27 MED ORDER — WARFARIN SODIUM 6 MG PO TABS
6.0000 mg | ORAL_TABLET | Freq: Once | ORAL | Status: AC
  Administered 2012-02-27: 6 mg via ORAL
  Filled 2012-02-27: qty 1

## 2012-02-27 MED ORDER — DILTIAZEM HCL ER COATED BEADS 240 MG PO CP24
240.0000 mg | ORAL_CAPSULE | Freq: Every day | ORAL | Status: DC
  Administered 2012-02-27 – 2012-02-28 (×2): 240 mg via ORAL
  Filled 2012-02-27 (×3): qty 1

## 2012-02-27 MED ORDER — SOTALOL HCL 80 MG PO TABS
160.0000 mg | ORAL_TABLET | Freq: Two times a day (BID) | ORAL | Status: DC
  Administered 2012-02-27 – 2012-02-28 (×2): 160 mg via ORAL
  Filled 2012-02-27 (×4): qty 2

## 2012-02-27 MED ORDER — SOTALOL HCL 80 MG PO TABS
ORAL_TABLET | ORAL | Status: AC
  Filled 2012-02-27: qty 2

## 2012-02-27 NOTE — Procedures (Signed)
[facility] Regis Hinton A. Gerilyn Pilgrim, MD     [email or web address]         NAMECHANIE, SOUCEK                  ACCOUNT NO.:  [id number]  MEDICAL RECORD NO.:  [id number]  LOCATION:  S159                          FACILITY:  [facility]  PHYSICIAN:  Noboru Bidinger A. Gerilyn Pilgrim, M.D. DATE OF BIRTH:  [birth year]  DATE OF PROCEDURE:  02/26/2012 DATE OF DISCHARGE:  02/25/2012                             EEG INTERPRETATION   INDICATION:  An 78 year old who presents with confusion and altered mental status.  The study was done to evaluate for nonconvulsive seizures.  MEDICATION:  Aspirin, Lovenox, insulin, potassium, warfarin.  ANALYSIS:  A 16-channel recording using standard 10/20 measurements was conducted for 22 minutes.  There was a well-formed posterior dominant rhythm that gets as high as 8 Hz.  There was beta activity observed in the frontal areas.  Awake and sleep activities were observed.  Brief episodes of K complexes and sleep spindles were observed.  There was no focal or lateralized slowing.  There was no epileptiform activity observed.  IMPRESSION:  Normal recording of awake and sleep states.     Makana Rostad A. Gerilyn Pilgrim, M.D.     KAD/MEDQ  D:  02/27/2012  T:  02/27/2012  Job:  [id number]

## 2012-02-27 NOTE — Progress Notes (Signed)
Patient sitting in recliner talking with family, HR  140-145 irregular, no complaints of shortness of breath, states she feels a little hot, no visual signs of perspirations or sweating, VS 139/88, sats 97% on room air, respirations 20, currently afib on monitor, will notify doctor.

## 2012-02-27 NOTE — Progress Notes (Signed)
See orders, new medication ordered for tachycardia, patient and family notified

## 2012-02-27 NOTE — Progress Notes (Signed)
ANTICOAGULATION CONSULT NOTE - Initial Consult  Pharmacy Consult for Coumadin Indication: atrial fibrillation  Allergies  Allergen Reactions  . Lasix (Furosemide) Swelling  . Penicillins Nausea And Vomiting and Swelling    Lip swelling and redness  . Sulfa Antibiotics Rash    Patient Measurements: Height: 5\' 6"  (167.6 cm) Weight: 192 lb 15.9 oz (87.54 kg) IBW/kg (Calculated) : 59.3   Vital Signs: Temp: 97.9 F (36.6 C) (12/14 0602) Temp src: Oral (12/14 0602) BP: 130/76 mmHg (12/14 0602) Pulse Rate: 66  (12/14 0602)  Labs:  Basename 02/27/12 0805 02/26/12 0514 02/25/12 2010 02/25/12 0510  HGB -- 8.0* 8.6* --  HCT -- 25.9* 28.2* 25.8*  PLT -- 250 308 254  APTT -- -- 28 34  LABPROT 14.2 -- 14.7 17.5*  INR 1.11 -- 1.17 1.48  HEPARINUNFRC -- -- -- --  CREATININE -- 0.88 1.01 --  CKTOTAL -- -- -- --  CKMB -- -- -- --  TROPONINI -- -- <0.30 --    Estimated Creatinine Clearance: 50.2 ml/min (by C-G formula based on Cr of 0.88).   Medical History: Past Medical History  Diagnosis Date  . Diastolic heart failure   . Ovarian cancer   . Colon cancer   . Arthritis   . History of Clostridium difficile infection     in Jan 2013  . Pacemaker   . History of cellulitis and abscess   . Hypertension     takes Ramipril,Diltiazem daily  . Atrial fibrillation     takes Coumadin daily  . Tachy-brady syndrome 2008    s/p PPM implantation by Dr Gifford Shave takes Betapace daily  . Osteoporosis     takes Prolia every 6months injections  . Shortness of breath     with exertion;Albuterol prn  . Bruises easily     takes COumadin daily  . GERD (gastroesophageal reflux disease)     takes Protonix daily  . Constipation     Milk of Mag nightly  . Anemia     takes Iron daily    Medications:  Scheduled:     . aspirin  300 mg Rectal Daily   Or  . aspirin  325 mg Oral Daily  . enoxaparin (LOVENOX) injection  30 mg Subcutaneous Q24H  . insulin aspart  0-9 Units Subcutaneous  Q4H  . [COMPLETED] warfarin  6 mg Oral ONCE-1800  . Warfarin - Pharmacist Dosing Inpatient   Does not apply q1800    Assessment: 78 yo F on chronic warfarin 3mg  on MWF and 6mg  on TTSS for Afib.  Warfarin has been on hold since 02-19-12 for surgical procedure.  She has acute blood loss anemia which is stable.  She was re-admitted from NH for stroke-like symptoms.   Plan to resume warfarin per MD request.  Noted neuro recommendation to d/c asa once INR therapeutic.   Goal of Therapy:  INR 2-3 Monitor platelets by anticoagulation protocol: Yes   Plan:  1) Coumadin 6mg  po x1 at 6pm 2) Daily INR 3) Overlap with Lovenox 30mg  sq daily per MD until INR>2  Onita Pfluger, [facility] 02/27/2012,8:42 AM

## 2012-02-27 NOTE — Evaluation (Signed)
Physical Therapy Evaluation Patient Details Name: [name] MRN: [id number] DOB: May 12, 1922 Today's Date: 02/27/2012 Time: 1140-1200 PT Time Calculation (min): 20 min  PT Assessment / Plan / Recommendation Clinical Impression  Pt who sx seem to reslove.  PT will benefit from therapy to return pt to prior functional level.    PT Assessment  Patient needs continued PT services    Follow Up Recommendations  SNF    Does the patient have the potential to tolerate intense rehabilitation    N/A  Barriers to Discharge  lives alone      Equipment Recommendations   none   Recommendations for Other Services   OT  Frequency   5x week   Precautions / Restrictions Precautions Precautions: None Restrictions Weight Bearing Restrictions: No   Pertinent Vitals/Pain 0/10      Mobility  Bed Mobility Bed Mobility: Supine to Sit Rolling Right: 4: Min assist Supine to Sit: 3: Mod assist Sit to Supine: 3: Mod assist Transfers Transfers: Sit to Stand Sit to Stand: 5: Supervision Stand to Sit: 5: Supervision Ambulation/Gait Ambulation/Gait Assistance: 5: Supervision Ambulation Distance (Feet): 200 Feet Assistive device: Rolling walker              PT Diagnosis: Difficulty walking  PT Problem List: Decreased strength;Decreased activity tolerance PT Treatment Interventions: Gait training;Therapeutic activities;Therapeutic exercise   PT Goals Acute Rehab PT Goals PT Goal Formulation: With patient Time For Goal Achievement: 02/29/12 Potential to Achieve Goals: Good Pt will go Sit to Stand: with modified independence PT Goal: Sit to Stand - Progress: Goal set today Pt will go Stand to Sit: with modified independence PT Goal: Stand to Sit - Progress: Goal set today Pt will Ambulate: >150 feet;with modified independence;with least restrictive assistive device PT Goal: Ambulate - Progress: Goal set today Pt will Go Up / Down Stairs: 1-2 stairs;with supervision PT Goal:  Up/Down Stairs - Progress: Goal set today  Visit Information  Last PT Received On: 02/27/12    Subjective Data  Subjective: Ms. Delgrande states that she feels much better Patient Stated Goal: To go home   Prior Functioning  Home Living Lives With: Alone Available Help at Discharge: Family Type of Home: House Home Access: Stairs to enter Secretary/administrator of Steps: 1 Entrance Stairs-Rails: None Home Layout: One level Bathroom Shower/Tub: Nurse, adult Accessibility: Yes How Accessible: Accessible via walker Prior Function Level of Independence: Independent with assistive device(s) Communication Communication: No difficulties    Cognition  Overall Cognitive Status: Appears within functional limits for tasks assessed/performed Arousal/Alertness: Awake/alert Orientation Level: Appears intact for tasks assessed Behavior During Session: [facility] for tasks performed    Extremity/Trunk Assessment Right Lower Extremity Assessment RLE ROM/Strength/Tone Deficits: Hip mm 3/5 ; knee and ankle wfl Left Lower Extremity Assessment LLE ROM/Strength/Tone Deficits: hip mm 3/5 ; knee and ankle wfl   Balance    End of Session PT - End of Session Equipment Utilized During Treatment: Gait belt Activity Tolerance: Patient tolerated treatment well Patient left: in chair Nurse Communication: Mobility status  GP     RUSSELL,CINDY 02/27/2012, 12:14 PM

## 2012-02-27 NOTE — Progress Notes (Signed)
     Subjective: This lady is better, her walking is good with the occupational therapy. She was seen by neurology, Dr Gerilyn Pilgrim, feels she did have a stroke mainly affecting her speech. Her speech appears to be improving.Marland Kitchen           Physical Exam: Blood pressure 130/76, pulse 66, temperature 97.9 F (36.6 C), temperature source Oral, resp. rate 18, height 5\' 6"  (1.676 m), weight 87.54 kg (192 lb 15.9 oz), SpO2 94.00%. She looks systemically well. She is alert and orientated. There is no facial asymmetry. She is moving both upper arms and both lower limbs. Heart sounds are present and that she has a resting tachycardia, seems to be irregular. Abdomen is soft and nontender. Lung fields are clear.   Investigations:  Recent Results (from the past 240 hour(s))  SURGICAL PCR SCREEN     Status: Normal   Collection Time   02/19/12  7:31 AM      Component Value Range Status Comment   MRSA, PCR NEGATIVE  NEGATIVE Final    Staphylococcus aureus NEGATIVE  NEGATIVE Final      Basic Metabolic Panel:  [facility] 02/27/12 0805 02/26/12 0514  NA 137 143  K 4.6 3.6  CL 104 108  CO2 29 29  GLUCOSE 120* 106*  BUN 12 20  CREATININE 0.78 0.88  CALCIUM 8.5 8.5  MG -- --  PHOS -- --   Liver Function Tests:  [facility] 02/26/12 0514 02/25/12 2010  AST 12 16  ALT 13 16  ALKPHOS 48 57  BILITOT 0.2* 0.2*  PROT 5.9* 6.3  ALBUMIN 2.6* 2.7*     CBC:  Basename 02/27/12 0805 02/26/12 0514 02/25/12 2010  WBC 8.4 7.4 --  NEUTROABS -- -- 4.3  HGB 8.9* 8.0* --  HCT 29.9* 25.9* --  MCV 77.9* 78.5 --  PLT 336 250 --    Ct Head Wo Contrast  02/25/2012  *RADIOLOGY REPORT*  Clinical Data: Blurry vision.  Possible stroke.  Left sided facial droop.  CT HEAD WITHOUT CONTRAST  Technique:  Contiguous axial images were obtained from the base of the skull through the vertex without contrast.  Comparison: None.  Findings: No mass lesion, mass effect, midline shift, hydrocephalus, hemorrhage.  No  acute territorial cortical ischemia/infarct. Atrophy and chronic ischemic white matter disease is present.  Intracranial atherosclerosis is present.  Calvarium intact.  IMPRESSION: Atrophy and chronic ischemic white matter disease without acute intracranial abnormality.   Original Report Authenticated By: Andreas Newport, M.D.       Medications: I have reviewed the patient's current medications.  Impression: 1. CVA. 2. Atrial fibrillation. 3. Status post pacemaker for sick sinus syndrome. 4. Colon cancer, status post sigmoid colectomy recently. 5. Hypertension. 6. Anemia, related to blood loss from recent surgery. Stable hemoglobin.     Plan: 1. Repeat CT brain scan. 2. Discontinue Foley catheter. 3. Possible discharge soon in the next 1-2 days.     LOS: 2 days   Wilson Singer Pager [phone]  02/27/2012, 11:50 AM

## 2012-02-28 ENCOUNTER — Inpatient Hospital Stay (HOSPITAL_COMMUNITY): Payer: Medicare Other

## 2012-02-28 DIAGNOSIS — I495 Sick sinus syndrome: Secondary | ICD-10-CM

## 2012-02-28 DIAGNOSIS — I4892 Unspecified atrial flutter: Secondary | ICD-10-CM

## 2012-02-28 LAB — GLUCOSE, CAPILLARY
Glucose-Capillary: 109 mg/dL — ABNORMAL HIGH (ref 70–99)
Glucose-Capillary: 113 mg/dL — ABNORMAL HIGH (ref 70–99)
Glucose-Capillary: 118 mg/dL — ABNORMAL HIGH (ref 70–99)
Glucose-Capillary: 139 mg/dL — ABNORMAL HIGH (ref 70–99)
Glucose-Capillary: 154 mg/dL — ABNORMAL HIGH (ref 70–99)

## 2012-02-28 LAB — PROTIME-INR: INR: 1.42 (ref 0.00–1.49)

## 2012-02-28 MED ORDER — WARFARIN SODIUM 6 MG PO TABS
6.0000 mg | ORAL_TABLET | Freq: Once | ORAL | Status: AC
  Administered 2012-02-28: 6 mg via ORAL
  Filled 2012-02-28: qty 1

## 2012-02-28 MED ORDER — SOTALOL HCL 80 MG PO TABS
240.0000 mg | ORAL_TABLET | Freq: Two times a day (BID) | ORAL | Status: DC
  Administered 2012-02-28 – 2012-02-29 (×2): 240 mg via ORAL
  Filled 2012-02-28: qty 3
  Filled 2012-02-28: qty 2
  Filled 2012-02-28: qty 3
  Filled 2012-02-28: qty 2
  Filled 2012-02-28 (×2): qty 3

## 2012-02-28 MED ORDER — SOTALOL HCL 80 MG PO TABS
80.0000 mg | ORAL_TABLET | Freq: Once | ORAL | Status: AC
  Administered 2012-02-28: 80 mg via ORAL
  Filled 2012-02-28: qty 1

## 2012-02-28 NOTE — Progress Notes (Signed)
Subjective: Overall, this lady is stable. However, she has had tachycardia, atrial fibrillation with rapid ventricular response. She denies any dyspnea or cough. According to the son, who is at the bedside, she has been feeling somewhat hot and cold.  There is no fever documented. She appears to have word finding difficulties.          Physical Exam: Blood pressure 101/71, pulse 131, temperature 97.1 F (36.2 C), temperature source Oral, resp. rate 19, height 5\' 6"  (1.676 m), weight 87.54 kg (192 lb 15.9 oz), SpO2 94.00%. She looks systemically well. She is alert and orientated. There is no facial asymmetry. She is moving both upper arms and both lower limbs. Heart sounds are present and that she has a resting tachycardia, seems to be irregular, consistent with atrial fibrillation. Abdomen is soft and nontender. Lung fields are clear, except for inspiratory basal crackles. There is no bronchial breathing. There is no wheezing..   Investigations:  Recent Results (from the past 240 hour(s))  SURGICAL PCR SCREEN     Status: Normal   Collection Time   02/19/12  7:31 AM      Component Value Range Status Comment   MRSA, PCR NEGATIVE  NEGATIVE Final    Staphylococcus aureus NEGATIVE  NEGATIVE Final      Basic Metabolic Panel:  [facility] 02/27/12 0805 02/26/12 0514  NA 137 143  K 4.6 3.6  CL 104 108  CO2 29 29  GLUCOSE 120* 106*  BUN 12 20  CREATININE 0.78 0.88  CALCIUM 8.5 8.5  MG -- --  PHOS -- --   Liver Function Tests:  [facility] 02/26/12 0514 02/25/12 2010  AST 12 16  ALT 13 16  ALKPHOS 48 57  BILITOT 0.2* 0.2*  PROT 5.9* 6.3  ALBUMIN 2.6* 2.7*     CBC:  Basename 02/27/12 0805 02/26/12 0514 02/25/12 2010  WBC 8.4 7.4 --  NEUTROABS -- -- 4.3  HGB 8.9* 8.0* --  HCT 29.9* 25.9* --  MCV 77.9* 78.5 --  PLT 336 250 --    Ct Head Wo Contrast  02/27/2012  *RADIOLOGY REPORT*  Clinical Data: Evaluate for stroke.  Altered mental status.  The patient's speech  appears affected. Pacemaker.  CT HEAD WITHOUT CONTRAST  Technique:  Contiguous axial images were obtained from the base of the skull through the vertex without contrast.  Comparison: 02/25/2012  Findings: There is no evidence for acute infarction, intracranial hemorrhage, mass lesion, hydrocephalus, or extra-axial fluid. Moderate atrophy is present with chronic microvascular ischemic change.  The calvarium remains intact.  Sinuses and mastoids are clear.  Compared with the previous study I do not see evidence for interval development of cortical hypodensity in the day.  Moderate vascular calcification is again noted in the carotid siphon regions.  IMPRESSION: Moderate atrophy and chronic microvascular ischemic change.  No visible acute stroke or bleed.   Original Report Authenticated By: Davonna Belling, M.D.       Medications: I have reviewed the patient's current medications.  Impression: 1. CVA, clinically although CT brain scan repeated does not show an acute CVA.Marland Kitchen 2. Atrial fibrillation with rapid ventricular response. 3. Status post pacemaker for sick sinus syndrome. 4. Colon cancer, status post sigmoid colectomy recently. 5. Hypertension. 6. Anemia, related to blood loss from recent surgery. Stable hemoglobin.     Plan: 1. Increase sotalol. 2. Chest x-ray to make sure she does not have congestive heart failure or pneumonia. 3. Continue anticoagulation. INR is 1.42 this  morning.     LOS: 3 days   Wilson Singer Pager [phone]  02/28/2012, 10:34 AM

## 2012-02-28 NOTE — Progress Notes (Signed)
ANTICOAGULATION CONSULT NOTE - Initial Consult  Pharmacy Consult for Coumadin Indication: atrial fibrillation  Allergies  Allergen Reactions  . Lasix (Furosemide) Swelling  . Penicillins Nausea And Vomiting and Swelling    Lip swelling and redness  . Sulfa Antibiotics Rash    Patient Measurements: Height: 5\' 6"  (167.6 cm) Weight: 192 lb 15.9 oz (87.54 kg) IBW/kg (Calculated) : 59.3   Vital Signs: Temp: 97.1 F (36.2 C) (12/15 0523) Temp src: Oral (12/15 0523) BP: 101/71 mmHg (12/15 0523) Pulse Rate: 131  (12/15 0523)  Labs:  Basename 02/28/12 0617 02/27/12 0805 02/26/12 0514 02/25/12 2010  HGB -- 8.9* 8.0* --  HCT -- 29.9* 25.9* 28.2*  PLT -- 336 250 308  APTT -- -- -- 28  LABPROT 17.0* 14.2 -- 14.7  INR 1.42 1.11 -- 1.17  HEPARINUNFRC -- -- -- --  CREATININE -- 0.78 0.88 1.01  CKTOTAL -- -- -- --  CKMB -- -- -- --  TROPONINI -- -- -- <0.30    Estimated Creatinine Clearance: 55.2 ml/min (by C-G formula based on Cr of 0.78).   Medical History: Past Medical History  Diagnosis Date  . Diastolic heart failure   . Ovarian cancer   . Colon cancer   . Arthritis   . History of Clostridium difficile infection     in Jan 2013  . Pacemaker   . History of cellulitis and abscess   . Hypertension     takes Ramipril,Diltiazem daily  . Atrial fibrillation     takes Coumadin daily  . Tachy-brady syndrome 2008    s/p PPM implantation by Dr Gifford Shave takes Betapace daily  . Osteoporosis     takes Prolia every 6months injections  . Shortness of breath     with exertion;Albuterol prn  . Bruises easily     takes COumadin daily  . GERD (gastroesophageal reflux disease)     takes Protonix daily  . Constipation     Milk of Mag nightly  . Anemia     takes Iron daily    Medications:  Scheduled:     . aspirin  300 mg Rectal Daily   Or  . aspirin  325 mg Oral Daily  . diltiazem  240 mg Oral Daily  . enoxaparin (LOVENOX) injection  30 mg Subcutaneous Q24H  .  insulin aspart  0-9 Units Subcutaneous Q4H  . sotalol  160 mg Oral BID  . [COMPLETED] warfarin  6 mg Oral ONCE-1800  . warfarin  6 mg Oral ONCE-1800  . Warfarin - Pharmacist Dosing Inpatient   Does not apply q1800    Assessment: 78 yo F on chronic warfarin 3mg  on MWF and 6mg  on TTSS for Afib.  Warfarin was held 02-19-12 for surgical procedure & resumed on 02/26/12 after stroke-like events.  She has acute blood loss anemia post-op which is stable/improving.   INR rising to goal. No bleeding noted.  Noted neuro recommendation to d/c asa once INR therapeutic.   Goal of Therapy:  INR 2-3 Monitor platelets by anticoagulation protocol: Yes   Plan:  1) Coumadin 6mg  po x1 at 6pm 2) Daily INR 3) Overlap with Lovenox 30mg  sq daily per MD until INR>2  Sheretta Grumbine, [facility] 02/28/2012,9:35 AM

## 2012-02-29 ENCOUNTER — Inpatient Hospital Stay (HOSPITAL_COMMUNITY): Payer: Medicare Other

## 2012-02-29 ENCOUNTER — Inpatient Hospital Stay
Admission: RE | Admit: 2012-02-29 | Discharge: 2012-04-07 | Disposition: A | Discharge status: Home / Self Care | Payer: Medicaid Other | Source: Ambulatory Visit | Attending: Internal Medicine | Admitting: Internal Medicine

## 2012-02-29 LAB — VITAMIN B12: Vitamin B-12: 1022 pg/mL — ABNORMAL HIGH (ref 211–911)

## 2012-02-29 LAB — COMPREHENSIVE METABOLIC PANEL
AST: 12 U/L (ref 0–37)
Albumin: 2.5 g/dL — ABNORMAL LOW (ref 3.5–5.2)
Alkaline Phosphatase: 44 U/L (ref 39–117)
BUN: 20 mg/dL (ref 6–23)
Potassium: 4 mEq/L (ref 3.5–5.1)
Total Protein: 5.7 g/dL — ABNORMAL LOW (ref 6.0–8.3)

## 2012-02-29 LAB — CBC
HCT: 27.9 % — ABNORMAL LOW (ref 36.0–46.0)
MCHC: 30.5 g/dL (ref 30.0–36.0)
Platelets: 338 10*3/uL (ref 150–400)
RDW: 21.8 % — ABNORMAL HIGH (ref 11.5–15.5)
WBC: 6.5 10*3/uL (ref 4.0–10.5)

## 2012-02-29 LAB — FERRITIN: Ferritin: 34 ng/mL (ref 10–291)

## 2012-02-29 LAB — RETICULOCYTES
Retic Count, Absolute: 69 10*3/uL (ref 19.0–186.0)
Retic Ct Pct: 1.9 % (ref 0.4–3.1)

## 2012-02-29 LAB — GLUCOSE, CAPILLARY
Glucose-Capillary: 100 mg/dL — ABNORMAL HIGH (ref 70–99)
Glucose-Capillary: 88 mg/dL (ref 70–99)

## 2012-02-29 LAB — PROTIME-INR
INR: 1.58 — ABNORMAL HIGH (ref 0.00–1.49)
Prothrombin Time: 18.4 seconds — ABNORMAL HIGH (ref 11.6–15.2)

## 2012-02-29 MED ORDER — DILTIAZEM HCL ER COATED BEADS 180 MG PO CP24
180.0000 mg | ORAL_CAPSULE | Freq: Every day | ORAL | Status: DC
  Administered 2012-02-29: 180 mg via ORAL
  Filled 2012-02-29: qty 1

## 2012-02-29 MED ORDER — DILTIAZEM HCL ER COATED BEADS 180 MG PO CP24: 180.0000 mg | ORAL_CAPSULE | Freq: Every day | ORAL | Status: DC

## 2012-02-29 MED ORDER — SOTALOL HCL 240 MG PO TABS: 240.0000 mg | ORAL_TABLET | Freq: Two times a day (BID) | ORAL | Status: DC

## 2012-02-29 MED ORDER — WARFARIN SODIUM 6 MG PO TABS: ORAL_TABLET | ORAL | Status: DC

## 2012-02-29 MED ORDER — WARFARIN SODIUM 6 MG PO TABS: 6.0000 mg | ORAL_TABLET | Freq: Once | ORAL | Status: DC

## 2012-02-29 NOTE — Progress Notes (Signed)
Speech Language Pathology Treatment Patient Details Name: [name] MRN: [id number] DOB: 07-02-1922 Today's Date: 02/29/2012 Time: 2440-1027 SLP Time Calculation (min): 27 min  Assessment / Plan / Recommendation Clinical Impression  Pt's verbal expression skills have improved since evaluation on Friday. She still presents with mild expressive aphasia with some decreased word retrieval and naming in conversation and in generational naming tasks. Recommend evaluation with SLP at [facility] once transferred to evaluate current needs.    SLP Plan  Discharge SLP treatment due to (comment);Other (Comment) (Pt to be discharged to [facility] today)       SLP Goals  SLP Goals Potential to Achieve Goals: Good Progress/Goals/Alternative treatment plan discussed with pt/caregiver and they: Agree SLP Goal #1: Pt will increase naming of common objects to 90% acc with min phonemic cues. SLP Goal #1 - Progress: Met SLP Goal #2: Pt will be able to name family members with moderate assistance (written cues prn). SLP Goal #2 - Progress: Met SLP Goal #3: Pt will utilize word finding strategies in conversation with moderate cues when needed. SLP Goal #3 - Progress: Met  General Temperature Spikes Noted: No Respiratory Status: Room air Behavior/Cognition: Alert;Cooperative;Pleasant mood Patient Positioning: Upright in bed  Treatment Treatment focused on: Aphasia Skilled Treatment: word finding, naming, verbal expression, oral reading        [name] 02/29/2012, 11:49 AM

## 2012-02-29 NOTE — Care Management Note (Signed)
    Page 1 of 1   02/29/2012     11:02:15 AM   CARE MANAGEMENT NOTE 02/29/2012  Patient:  [name], [name]   Account Number:  [id number]  Date Initiated:  02/29/2012  Documentation initiated by:  Rosemary Holms  Subjective/Objective Assessment:   Pt admitted from [facility] and return today.     Action/Plan:   Anticipated DC Date:  02/29/2012   Anticipated DC Plan:  SKILLED NURSING FACILITY  In-house referral  Clinical Social Worker      DC Planning Services  CM consult      Choice offered to / List presented to:             Status of service:  Completed, signed off Medicare Important Message given?   (If response is "NO", the following Medicare IM given date fields will be blank) Date Medicare IM given:   Date Additional Medicare IM given:    Discharge Disposition:  SKILLED NURSING FACILITY  Per UR Regulation:    If discussed at Long Length of Stay Meetings, dates discussed:    Comments:  02/29/12 Rosemary Holms RN BSN CM

## 2012-02-29 NOTE — Progress Notes (Signed)
Pt's IV removed.  Pt tolerated well.  Report called to Clydie Braun at [facility].  Pt transported by NT via bed to [facility].  Pt stable at time of discharge.

## 2012-02-29 NOTE — Progress Notes (Signed)
ANTICOAGULATION CONSULT NOTE Pharmacy Consult for Coumadin Indication: atrial fibrillation  Allergies  Allergen Reactions  . Lasix (Furosemide) Swelling  . Penicillins Nausea And Vomiting and Swelling    Lip swelling and redness  . Sulfa Antibiotics Rash    Patient Measurements: Height: 5\' 6"  (167.6 cm) Weight: 192 lb 15.9 oz (87.54 kg) IBW/kg (Calculated) : 59.3   Vital Signs: Temp: 97.3 F (36.3 C) (12/16 0412) Temp src: Oral (12/16 0412) BP: 108/64 mmHg (12/16 0919) Pulse Rate: 60  (12/16 0919)  Labs:  Basename 02/29/12 0446 02/28/12 0617 02/27/12 0805  HGB 8.5* -- 8.9*  HCT 27.9* -- 29.9*  PLT 338 -- 336  APTT -- -- --  LABPROT 18.4* 17.0* 14.2  INR 1.58* 1.42 1.11  HEPARINUNFRC -- -- --  CREATININE 0.96 -- 0.78  CKTOTAL -- -- --  CKMB -- -- --  TROPONINI -- -- --    Estimated Creatinine Clearance: 46 ml/min (by C-G formula based on Cr of 0.96).   Medical History: Past Medical History  Diagnosis Date  . Diastolic heart failure   . Ovarian cancer   . Colon cancer   . Arthritis   . History of Clostridium difficile infection     in Jan 2013  . Pacemaker   . History of cellulitis and abscess   . Hypertension     takes Ramipril,Diltiazem daily  . Atrial fibrillation     takes Coumadin daily  . Tachy-brady syndrome 2008    s/p PPM implantation by Dr Gifford Shave takes Betapace daily  . Osteoporosis     takes Prolia every 6months injections  . Shortness of breath     with exertion;Albuterol prn  . Bruises easily     takes COumadin daily  . GERD (gastroesophageal reflux disease)     takes Protonix daily  . Constipation     Milk of Mag nightly  . Anemia     takes Iron daily    Medications:  Scheduled:     . aspirin  325 mg Oral Daily  . diltiazem  240 mg Oral Daily  . enoxaparin (LOVENOX) injection  30 mg Subcutaneous Q24H  . insulin aspart  0-9 Units Subcutaneous Q4H  . sotalol  240 mg Oral BID  . [COMPLETED] sotalol  80 mg Oral Once  .  [COMPLETED] warfarin  6 mg Oral ONCE-1800  . Warfarin - Pharmacist Dosing Inpatient   Does not apply q1800  . [DISCONTINUED] aspirin  300 mg Rectal Daily  . [DISCONTINUED] sotalol  160 mg Oral BID    Assessment: 78 yo F on chronic warfarin 3mg  on MWF and 6mg  on TTSS for Afib.  Warfarin was held 02-19-12 for surgical procedure & resumed on 02/26/12 after stroke-like events.  She has acute blood loss anemia post-op which is stable/improving.   INR rising to goal. No bleeding noted.  Noted neuro recommendation to d/c asa once INR therapeutic.   Goal of Therapy:  INR 2-3 Monitor platelets by anticoagulation protocol: Yes   Plan:  1) Coumadin 6mg  po x1 at 6pm 2) Daily INR 3) Overlap with Lovenox 30mg  sq daily per MD until INR>2  Linn Goetze, [facility] 02/29/2012,9:26 AM

## 2012-02-29 NOTE — Progress Notes (Signed)
Patient ID: [name], female   DOB: 03/27/1922, 78 y.o.   MRN: [id number]  [facility] NEUROLOGY Aneth Schlagel A. Gerilyn Pilgrim, MD     [email or web address]          [name] is an 78 y.o. female.   Assessment/Plan: Acute left operculum infarct. The size is likely small to moderate. The patient has improved clinically. She is on warfarin and aspirin. The warfarin should be continued given the atrial fibrillation. I see no reason for the patient to remain on aspirin long term. She has not had a carotid echo done and therefore this will be obtained.  The patient reports no new complaints today.  She is awake and alert. Her thoughts are more appropriate and responsive. She still has some difficulties with comprehension but is rather fluent. She still has some disorientation. She is not quite certain which hospital she is located she notes that she is in the hospital in [place]. She is not oriented to time. She did not name 5 out of 5 objects. Pupils are reactive and visual fields are intact she has good strength throughout.     Objective: Vital signs in last 24 hours: Temp:  [97 F (36.1 C)-97.6 F (36.4 C)] 97.3 F (36.3 C) (12/16 0412) Pulse Rate:  [60-80] 63  (12/16 0412) Resp:  [18-20] 18  (12/16 0412) BP: (98-108)/(60-72) 108/68 mmHg (12/16 0412) SpO2:  [93 %-98 %] 93 % (12/16 0412)  Intake/Output from previous day: 12/15 0701 - 12/16 0700 In: 1270 [P.O.:1260; I.V.:10] Out: -  Intake/Output this shift:   Nutritional status: General   Lab Results: Results for orders placed during the hospital encounter of 02/25/12 (from the past 48 hour(s))  GLUCOSE, CAPILLARY     Status: Abnormal   Collection Time   02/27/12 12:12 PM      Component Value Range Comment   Glucose-Capillary 126 (*) 70 - 99 mg/dL   GLUCOSE, CAPILLARY     Status: Normal   Collection Time   02/27/12  4:32 PM      Component Value Range Comment   Glucose-Capillary 92  70 - 99 mg/dL    Comment 1 Documented in  Chart      Comment 2 Notify RN     GLUCOSE, CAPILLARY     Status: Abnormal   Collection Time   02/27/12  7:59 PM      Component Value Range Comment   Glucose-Capillary 147 (*) 70 - 99 mg/dL   GLUCOSE, CAPILLARY     Status: Abnormal   Collection Time   02/28/12 12:25 AM      Component Value Range Comment   Glucose-Capillary 139 (*) 70 - 99 mg/dL   GLUCOSE, CAPILLARY     Status: Abnormal   Collection Time   02/28/12  5:22 AM      Component Value Range Comment   Glucose-Capillary 132 (*) 70 - 99 mg/dL   PROTIME-INR     Status: Abnormal   Collection Time   02/28/12  6:17 AM      Component Value Range Comment   Prothrombin Time 17.0 (*) 11.6 - 15.2 seconds    INR 1.42  0.00 - 1.49   GLUCOSE, CAPILLARY     Status: Abnormal   Collection Time   02/28/12  7:48 AM      Component Value Range Comment   Glucose-Capillary 118 (*) 70 - 99 mg/dL   GLUCOSE, CAPILLARY     Status: Abnormal   Collection Time   02/28/12  11:47 AM      Component Value Range Comment   Glucose-Capillary 113 (*) 70 - 99 mg/dL   GLUCOSE, CAPILLARY     Status: Abnormal   Collection Time   02/28/12  5:08 PM      Component Value Range Comment   Glucose-Capillary 109 (*) 70 - 99 mg/dL   GLUCOSE, CAPILLARY     Status: Abnormal   Collection Time   02/28/12  8:17 PM      Component Value Range Comment   Glucose-Capillary 154 (*) 70 - 99 mg/dL   GLUCOSE, CAPILLARY     Status: Normal   Collection Time   02/29/12 12:19 AM      Component Value Range Comment   Glucose-Capillary 86  70 - 99 mg/dL   GLUCOSE, CAPILLARY     Status: Abnormal   Collection Time   02/29/12  4:09 AM      Component Value Range Comment   Glucose-Capillary 100 (*) 70 - 99 mg/dL   PROTIME-INR     Status: Abnormal   Collection Time   02/29/12  4:46 AM      Component Value Range Comment   Prothrombin Time 18.4 (*) 11.6 - 15.2 seconds    INR 1.58 (*) 0.00 - 1.49   CBC     Status: Abnormal   Collection Time   02/29/12  4:46 AM      Component  Value Range Comment   WBC 6.5  4.0 - 10.5 K/uL    RBC 3.61 (*) 3.87 - 5.11 MIL/uL    Hemoglobin 8.5 (*) 12.0 - 15.0 g/dL    HCT 72.5 (*) 36.6 - 46.0 %    MCV 77.3 (*) 78.0 - 100.0 fL    MCH 23.5 (*) 26.0 - 34.0 pg    MCHC 30.5  30.0 - 36.0 g/dL    RDW 44.0 (*) 34.7 - 15.5 %    Platelets 338  150 - 400 K/uL   COMPREHENSIVE METABOLIC PANEL     Status: Abnormal   Collection Time   02/29/12  4:46 AM      Component Value Range Comment   Sodium 141  135 - 145 mEq/L    Potassium 4.0  3.5 - 5.1 mEq/L    Chloride 105  96 - 112 mEq/L    CO2 29  19 - 32 mEq/L    Glucose, Bld 94  70 - 99 mg/dL    BUN 20  6 - 23 mg/dL    Creatinine, Ser 4.25  0.50 - 1.10 mg/dL    Calcium 8.5  8.4 - 95.6 mg/dL    Total Protein 5.7 (*) 6.0 - 8.3 g/dL    Albumin 2.5 (*) 3.5 - 5.2 g/dL    AST 12  0 - 37 U/L    ALT 9  0 - 35 U/L    Alkaline Phosphatase 44  39 - 117 U/L    Total Bilirubin 0.2 (*) 0.3 - 1.2 mg/dL    GFR calc non Af Amer 52 (*) >90 mL/min    GFR calc Af Amer 60 (*) >90 mL/min   GLUCOSE, CAPILLARY     Status: Normal   Collection Time   02/29/12  7:13 AM      Component Value Range Comment   Glucose-Capillary 88  70 - 99 mg/dL     Lipid Panel No results found for this basename: CHOL,TRIG,HDL,CHOLHDL,VLDL,LDLCALC in the last 72 hours  Studies/Results: Ct Head Wo Contrast  02/27/2012  *RADIOLOGY REPORT*  Clinical Data: Evaluate for stroke.  Altered mental status.  The patient's speech appears affected. Pacemaker.  CT HEAD WITHOUT CONTRAST  Technique:  Contiguous axial images were obtained from the base of the skull through the vertex without contrast.  Comparison: 02/25/2012  Findings: There is no evidence for acute infarction, intracranial hemorrhage, mass lesion, hydrocephalus, or extra-axial fluid. Moderate atrophy is present with chronic microvascular ischemic change.  The calvarium remains intact.  Sinuses and mastoids are clear.  Compared with the previous study I do not see evidence for  interval development of cortical hypodensity in the day.  Moderate vascular calcification is again noted in the carotid siphon regions.  IMPRESSION: Moderate atrophy and chronic microvascular ischemic change.  No visible acute stroke or bleed.   Original Report Authenticated By: Davonna Belling, M.D.    Dg Chest Port 1 View  02/28/2012  *RADIOLOGY REPORT*  Clinical Data: Cough.  Recent stroke.  PORTABLE CHEST - 1 VIEW  Comparison: Chest x-ray dated 06/09/2011 and chest CT dated 01/04/2012  Findings: Dual lead pacer in place.  Borderline cardiomegaly, chronic.  There are no acute infiltrates or effusions.  The patient does have peribronchial thickening bilaterally consistent with bronchitis.  IMPRESSION: Bronchitic changes.   Original Report Authenticated By: Francene Boyers, M.D.     Medications:  Scheduled Meds:   . aspirin  325 mg Oral Daily  . diltiazem  240 mg Oral Daily  . enoxaparin (LOVENOX) injection  30 mg Subcutaneous Q24H  . insulin aspart  0-9 Units Subcutaneous Q4H  . sotalol  240 mg Oral BID  . Warfarin - Pharmacist Dosing Inpatient   Does not apply q1800   Continuous Infusions:   . 0.9 % NaCl with KCl 20 mEq / L Stopped (02/28/12 2357)   PRN Meds:.magnesium hydroxide, ondansetron (ZOFRAN) IV     LOS: 4 days   Delonna Ney A. Gerilyn Pilgrim, M.D.  Diplomate, Biomedical engineer of Psychiatry and Neurology ( Neurology).

## 2012-02-29 NOTE — Consult Note (Signed)
CARDIOLOGY CONSULT NOTE  Patient ID: [name] MRN: [id number] DOB/AGE: 1922/04/30 78 y.o.  Admit date: 02/25/2012 Referring Physician: PTH, Karilyn Cota Primary Danny Lawless, FNP Primary Cardiologist: Garnet Sierras) Primary Electrophysiologist: Allred Reason for Consultation:Atrial fibrillation with RVR  Active Problems:  Pacemaker-Medtronic  Hypertension  Atrial fibrillation  IBS (irritable bowel syndrome)  Anemia  Cancer of right colon -02/2012  Stroke  Hyperglycemia  HPI: Mrs. Tissue  Has a history of atrial fibrillation, SSS, and recent generator change due to ERI, in May 2013. This is a Medtronic Adapt L model ADDDR 1 (serial number [id number]) pacemaker.  She had had recent laparoscopic converted to Open Right Colectomy, lysis of adhesions 02/19/2012 in the setting of colon cancer. Coumadin was held perioperatively.  She was recovering at the [facility] when her son noticed that she had a right facial droop and slowed speech. She was brought to [facility] ER and admitted with acute CVA, evaluated by Dr. Gerilyn Pilgrim.  CT scan demonstrated only moderate atrophy and chronic microvascular ischemic change.     On 02/28/2012 she had episode of AFib with RVR, Sotolol was increased to 240 mg BID from 160 mg BID with improvement in HR. She was continued on diltiazem 240 mg. She has been restarted on coumadin therapy with INR 1.58 this am. Pharmacy is managing dosing at present.  Heart rate and BP are well controlled at present. We are asked for further recommendations.       Review of systems complete and found to be negative unless listed above  Past Medical History  Diagnosis Date  . Diastolic heart failure   . Ovarian cancer   . Colon cancer   . Arthritis   . History of Clostridium difficile infection     in Jan 2013  . Pacemaker   . History of cellulitis and abscess   . Hypertension     takes Ramipril,Diltiazem daily  . Atrial fibrillation     takes Coumadin daily  .  Tachy-brady syndrome 2008    s/p PPM implantation by Dr Gifford Shave takes Betapace daily  . Osteoporosis     takes Prolia every 6months injections  . Shortness of breath     with exertion;Albuterol prn  . Bruises easily     takes COumadin daily  . GERD (gastroesophageal reflux disease)     takes Protonix daily  . Constipation     Milk of Mag nightly  . Anemia     takes Iron daily    Family History  Problem Relation Age of Onset  . Anesthesia problems Neg Hx   . Hypotension Neg Hx   . Malignant hyperthermia Neg Hx   . Pseudochol deficiency Neg Hx   . Cancer Mother     ovarian    History   Social History  . Marital Status: Widowed    Spouse Name: N/A    Number of Children: N/A  . Years of Education: N/A   Occupational History  . Not on file.   Social History Main Topics  . Smoking status: Never Smoker   . Smokeless tobacco: Never Used  . Alcohol Use: No  . Drug Use: No  . Sexually Active: No   Other Topics Concern  . Not on file   Social History Narrative  . No narrative on file    Past Surgical History  Procedure Date  . Abdominal hysterectomy   . Oophorectomy   . Colonoscopy 03/30/2011    Procedure: COLONOSCOPY;  Surgeon: Barrie Folk,  MD;  Location: [facility] ENDOSCOPY;  Service: Endoscopy;  Laterality: N/A;  . Esophagogastroduodenoscopy 03/30/2011    Procedure: ESOPHAGOGASTRODUODENOSCOPY (EGD);  Surgeon: Barrie Folk, MD;  Location: [facility] ENDOSCOPY;  Service: Endoscopy;  Laterality: N/A;  . Colon resection 04/03/2011    Procedure: COLON RESECTION LAPAROSCOPIC;  Surgeon: Atilano Ina, MD;  Location: [facility] OR;  Service: General;  Laterality: N/A;  Laparoscopic, turned open sigmoid colon resection, lysis of adhesions x 2.5 hours, rigid proctoscopy.  . Pacemaker placement 2008    MDT EnRhythm implanted by Dr Amil Amen, generator change (MDT Adapta L) by Dr Johney Frame 08/04/11  . Colonoscopy 12/24/2011    Procedure: COLONOSCOPY;  Surgeon: Malissa Hippo, MD;  Location: [facility] ENDO  SUITE;  Service: Endoscopy;  Laterality: N/A;  . Bilateral cataracts   . Battery changed in pacemaker   . Laparoscopic right hemi colectomy 02/19/2012    Procedure: LAPAROSCOPIC RIGHT HEMI COLECTOMY;  Surgeon: Atilano Ina, MD,FACS;  Location: [facility] OR;  Service: General;  Laterality: Right;  attempted laparoscopic assisted right hemi colectomy converted to open.  . Partial colectomy 02/19/2012    Procedure: PARTIAL COLECTOMY;  Surgeon: Atilano Ina, MD,FACS;  Location: [facility] OR;  Service: General;  Laterality: Right;  . Lysis of adhesion 02/19/2012    Procedure: LYSIS OF ADHESION;  Surgeon: Atilano Ina, MD,FACS;  Location: [facility] OR;  Service: General;  Laterality: N/A;    Prescriptions prior to admission  Medication Sig Dispense Refill  . albuterol (PROVENTIL HFA;VENTOLIN HFA) 108 (90 BASE) MCG/ACT inhaler Inhale 2 puffs into the lungs every 6 (six) hours as needed. For shortness of breath/wheezing      . bumetanide (BUMEX) 1 MG tablet Take 1 tablet (1 mg total) by mouth daily.  30 tablet  0  . calcium carbonate (OS-CAL - DOSED IN MG OF ELEMENTAL CALCIUM) 1250 MG tablet Take 1 tablet by mouth 2 (two) times daily.      Marland Kitchen diltiazem (CARDIZEM CD) 240 MG 24 hr capsule Take 240 mg by mouth daily.      . feeding supplement (ENSURE COMPLETE) LIQD Take 237 mLs by mouth 2 (two) times daily between meals.      . magnesium hydroxide (MILK OF MAGNESIA) 400 MG/5ML suspension Take 30 mLs by mouth at bedtime.       Marland Kitchen oxyCODONE-acetaminophen (PERCOCET/ROXICET) 5-325 MG per tablet Take 1-2 tablets by mouth every 4 (four) hours as needed.  30 tablet  0  . pantoprazole (PROTONIX) 40 MG tablet Take 40 mg by mouth daily.      . polysaccharide iron (NIFEREX) 150 MG CAPS capsule Take 150 mg by mouth every morning.       . potassium chloride SA (K-DUR,KLOR-CON) 20 MEQ tablet Take 20 mEq by mouth daily.      . ramipril (ALTACE) 10 MG capsule Take 10 mg by mouth daily.        . sotalol (BETAPACE) 160 MG tablet Take 1 tablet  (160 mg total) by mouth 2 (two) times daily.  60 tablet  6  . denosumab (PROLIA) 60 MG/ML SOLN Inject 60 mg into the skin every 6 (six) months.       . warfarin (COUMADIN) 3 MG tablet Take 1-2 tablets (3-6 mg total) by mouth daily. RESUME NEXT Monday - 02/29/12; Take 1 tab on Monday, Wednesday, and Friday, then 2 tabs all other days      Physical Exam: Blood pressure 108/68, pulse 63, temperature 97.3 F (36.3 C), temperature source Oral, resp. rate 18,  height 5\' 6"  (1.676 m), weight 192 lb 15.9 oz (87.54 kg), SpO2 93.00%.   General: Well developed, well nourished, in no acute distress Head: Eyes PERRLA, No xanthomas.   Normal cephalic and atramatic  Lungs: Mild bibasilar crackles.No wheezes or coughing.  Heart: HRIR S1 S2, without MRG.  Pulses are 2+ & equal.            No carotid bruit. No JVD.  No abdominal bruits. No femoral bruits. Abdomen: Bowel sounds are positive, abdomen soft and non-tender without masses or                  Hernia's noted. Msk:  Back normal, slow gait. Overall deconditioned strength and tone for age. Extremities: No clubbing, cyanosis or edema.  DP +1 Neuro: Alert and oriented X 3. No focal deficits. Psych:  Good affect, responds appropriately  ECHOCARDIOGRAM:02/26/2012 Left ventricle: The cavity size was normal. There was moderate concentric hypertrophy. Systolic function was normal. The estimated ejection fraction was in the range of 60% to 65%. Wall motion was normal; there were no regional wall motion abnormalities. - Aortic valve: Mildly calcified annulus. Trileaflet; mildly thickened, mildly calcified leaflets. - Left atrium: The atrium was moderately dilated. - Right ventricle: The cavity size was normal. Wall thickness was mildly increased. - Right atrium: The atrium was moderately dilated. - Atrial septum: No defect or patent foramen ovale was identified.  Labs:   Lab Results  Component Value Date   WBC 6.5 02/29/2012   HGB 8.5* 02/29/2012    HCT 27.9* 02/29/2012   MCV 77.3* 02/29/2012   PLT 338 02/29/2012    Lab 02/29/12 0446  NA 141  K 4.0  CL 105  CO2 29  BUN 20  CREATININE 0.96  CALCIUM 8.5  PROT 5.7*  BILITOT 0.2*  ALKPHOS 44  ALT 9  AST 12  GLUCOSE 94   Lab Results  Component Value Date   CKTOTAL 50 06/03/2011   CKMB 2.7 06/03/2011   TROPONINI <0.30 02/25/2012    Lab Results  Component Value Date   CHOL 124 02/26/2012   Lab Results  Component Value Date   HDL 43 02/26/2012   Lab Results  Component Value Date   LDLCALC 61 02/26/2012   Lab Results  Component Value Date   TRIG 100 02/26/2012   Lab Results  Component Value Date   CHOLHDL 2.9 02/26/2012   No results found for this basename: LDLDIRECT    Radiology: Ct Head Wo Contrast  02/27/2012  *RADIOLOGY REPORT*  Clinical Data: Evaluate for stroke.  Altered mental status.  The patient's speech appears affected. Pacemaker.  CT HEAD WITHOUT CONTRAST  Technique:  IMPRESSION: Moderate atrophy and chronic microvascular ischemic change.  No visible acute stroke or bleed.   Original Report Authenticated By: Davonna Belling, M.D.    Dg Chest Port 1 View  02/28/2012  *RADIOLOGY REPORT*  Clinical Data: Cough.  Recent stroke.  PORTABLE CHEST - 1 VIEW  Comparison: Chest x-ray dated 06/09/2011 and chest CT dated 01/04/2012  Findings: Dual lead pacer in place.  Borderline cardiomegaly, chronic.  There are no acute infiltrates or effusions.  The patient does have peribronchial thickening bilaterally consistent with bronchitis.  IMPRESSION: Bronchitic changes.   Original Report Authenticated By: Francene Boyers, M.D.    ZOX:WRUEAV fib with RBBB. 120 bpm.   ASSESSMENT AND PLAN:   1. Atrial Fib with RVR: She has a history of atrial fib with anticoagulation therapy and is actually followed by [facility]  for ongoing coumadin dosing.She was anemic from recent colectomy certainly could have contributed to her rapid HR.  Heart rate is now well controlled with  increased dose of sotolol, but blood pressure has become soft. Will decrease diltiazem to 180 mg daily and watch response of both HR and BP. Medtronic pacemaker will be checked during this admission for need to adjust parameters.  2. CVA: Likely embolic with normal INR on admission.  She appears to have recovered from this without residual weakness.  She is now back on anticoagulation and is being followed by pharmacy for dosing.   3. Anemia: On admission Hgb 7.0, but has improved to 8.5 today.She has not received blood transfusion. Felt to be blood loss from colectomy.   Bettey Mare. Lyman Bishop NP [facility] 02/29/2012, 8:19 AM  Cardiology Attending Above note reviewed and annotated.  Embolic CVA and documented recurrent atrial fibrillation on moderate dose sotalol.  Alternative strategy would be rate control, which will be facilitated by the presence of the pacemaker.  I see no indication of past records the patient was symptomatic with her AF.  Most recent pacemaker assessment showed multiple episodes, likely of brief duration.  We will arrange followup with Dr. Johney Frame for reassessment of therapy.  [place], MD 03/02/2012, 9:39 AM

## 2012-02-29 NOTE — Clinical Social Work Note (Signed)
Patient discharged and ready to return to [facility].  Family and patient informed and agreeable.  Faxed discharge summary to facility via [facility].  Discharge packet prepared, FL2 not needed for return to [facility].  Patient will be transported by [facility] staff via tunnel.  Discharge packet will be placed in shadow chart for transport when patient is ready.  Santa Genera, LCSW Clinical Social Worker [phone])

## 2012-02-29 NOTE — Discharge Summary (Signed)
Physician Discharge Summary  [name] ZOX:[id number] DOB: June 18, 1922 DOA: 02/25/2012  PCP: Bennie Pierini, FNP  Admit date: 02/25/2012 Discharge date: 02/29/2012  Time spent: Greater than 30 minutes  Recommendations for Outpatient Follow-up:  1. Followup with cardiology, Dr. Dietrich Pates.   Discharge Diagnoses: 1. Acute clinical CVA. CT brain scans unremarkable. MRI and not able to be done. Will need speech therapy in the skilled nursing facility.  2. Atrial fibrillation with rapid ventricular response, medications adjusted. On chronic anticoagulation, INR not quite therapeutic yet. INR should be between 2 and 3. 3. Hypertension. 4. Recent colon cancer status post surgery.   Discharge Condition: Stable.  Diet recommendation: Heart healthy diet.  Filed Weights   02/25/12 1947 02/25/12 2354  Weight: 87.544 kg (193 lb) 87.54 kg (192 lb 15.9 oz)    History of present illness:  This very pleasant 78 year old lady presents to the hospital with symptoms of acute mental status change. Please see initial history as outlined below: [name] is an 78 y.o. female. Caucasian discharged today from [facility] where she had right hemicolectomy for stage Ca colon; discharged to [facility]. Shortly after being admitted SNF her son noted her head to droop, jerking movements of her eyes, right facial droop, but speech. She was transported to [facility] emergency room within 15-30 minutes, CT scan of the head was negative, but because of her recent surgery she is not a candidate for TPA; hospitalist service was called to admit for presumed acute stroke.  Patient has A. Fib/ A. flutter and is maintained on Coumadin, but then was discontinued for surgery on December 6, and there has been a delay in restarting her Coumadin as she had an episode of a 2. drop in hemoglobin 4 days ago requiring transfusion of 2 units of packed cells. Her hemoglobin has been stable at around 8 since then.  Coumadin is due to be restarted today.  Patient also has a pacemaker for arrhythmia.  Initially in the emergency room patient was alert and talking and passed a swallowing screen; but by the time of the hospitalist interview patient is again very drowsy and troponin right facial weakness is returned, she responds poorly to come arms and seems unable to speak.  There's been no history of fever or cough is been no history of passage of bloody or black stool there's been no nausea or vomiting  Hospital Course:  Patient was admitted and monitored very closely. 2 CT brain scans did not show evidence of CVA. She improved dramatically the following day after admission but remains with problems with expressive dysphasia. She has been having physical therapy in the hospital. She has had trouble with atrial fibrillation with rapid ventricle response and medications have been adjusted by cardiology. Also her pacemaker will be checked prior to discharge. She is now stable that she can be discharged but will require fairly intensive speech therapy in the skilled nursing facility. Her INR is not therapeutic yet but this needs to be monitored closely and dose of warfarin adjusted to maintain an INR between 2-3.  Procedures:  None.   Consultations:  Cardiology, Dr. Dietrich Pates.  Discharge Exam: Filed Vitals:   02/28/12 2141 02/29/12 0224 02/29/12 0412 02/29/12 0919  BP: 98/62 108/67 108/68 108/64  Pulse: 60 62 63 60  Temp:  97.5 F (36.4 C) 97.3 F (36.3 C)   TempSrc:  Oral Oral   Resp:  18 18   Height:      Weight:  SpO2:  96% 93%     General: She looks systemically well. She appears to be alert but not completely orientated in place. Cardiovascular: Heart sounds are present and appear to be irregular but ventricular rate very well controlled in the high 60s and 70s. Respiratory: Lung fields are clear. Neurological: No obvious focal neurological signs except for dysphasia.  Discharge  Instructions  Discharge Orders    Future Appointments: Provider: Department: Dept Phone: Center:   03/03/2012 1:30 PM Atilano Ina, MD,FACS [facility], [place] [phone] None     Future Orders Please Complete By Expires   Diet - low sodium heart healthy      Increase activity slowly          Medication List     As of 02/29/2012 10:17 AM    STOP taking these medications         bumetanide 1 MG tablet   Commonly known as: BUMEX      potassium chloride SA 20 MEQ tablet   Commonly known as: K-DUR,KLOR-CON      ramipril 10 MG capsule   Commonly known as: ALTACE      TAKE these medications         albuterol 108 (90 BASE) MCG/ACT inhaler   Commonly known as: PROVENTIL HFA;VENTOLIN HFA   Inhale 2 puffs into the lungs every 6 (six) hours as needed. For shortness of breath/wheezing      calcium carbonate 1250 MG tablet   Commonly known as: OS-CAL - dosed in mg of elemental calcium   Take 1 tablet by mouth 2 (two) times daily.      diltiazem 180 MG 24 hr capsule   Commonly known as: CARDIZEM CD   Take 1 capsule (180 mg total) by mouth daily.      feeding supplement Liqd   Take 237 mLs by mouth 2 (two) times daily between meals.      magnesium hydroxide 400 MG/5ML suspension   Commonly known as: MILK OF MAGNESIA   Take 30 mLs by mouth at bedtime.      oxyCODONE-acetaminophen 5-325 MG per tablet   Commonly known as: PERCOCET/ROXICET   Take 1-2 tablets by mouth every 4 (four) hours as needed.      pantoprazole 40 MG tablet   Commonly known as: PROTONIX   Take 40 mg by mouth daily.      polysaccharide iron 150 MG capsule   Generic drug: iron polysaccharides   Take 150 mg by mouth every morning.      PROLIA 60 MG/ML Soln injection   Generic drug: denosumab   Inject 60 mg into the skin every 6 (six) months.      sotalol 240 MG tablet   Commonly known as: BETAPACE   Take 1 tablet (240 mg total) by mouth 2 (two) times daily.      warfarin 6 MG tablet    Commonly known as: COUMADIN   Continue with warfarin 6 mg daily and monitor INR daily. Adjust dose to obtain INR between 2-3.           Follow-up Information    Follow up with Morrison Bing, MD. Schedule an appointment as soon as possible for a visit in 1 week.   Contact information:   [address] [place] [address] [phone]           The results of significant diagnostics from this hospitalization (including imaging, microbiology, ancillary and laboratory) are listed below for reference.  Significant Diagnostic Studies: Ct Head Wo Contrast  02/27/2012  *RADIOLOGY REPORT*  Clinical Data: Evaluate for stroke.  Altered mental status.  The patient's speech appears affected. Pacemaker.  CT HEAD WITHOUT CONTRAST  Technique:  Contiguous axial images were obtained from the base of the skull through the vertex without contrast.  Comparison: 02/25/2012  Findings: There is no evidence for acute infarction, intracranial hemorrhage, mass lesion, hydrocephalus, or extra-axial fluid. Moderate atrophy is present with chronic microvascular ischemic change.  The calvarium remains intact.  Sinuses and mastoids are clear.  Compared with the previous study I do not see evidence for interval development of cortical hypodensity in the day.  Moderate vascular calcification is again noted in the carotid siphon regions.  IMPRESSION: Moderate atrophy and chronic microvascular ischemic change.  No visible acute stroke or bleed.   Original Report Authenticated By: Davonna Belling, M.D.    Ct Head Wo Contrast  02/25/2012  *RADIOLOGY REPORT*  Clinical Data: Blurry vision.  Possible stroke.  Left sided facial droop.  CT HEAD WITHOUT CONTRAST  Technique:  Contiguous axial images were obtained from the base of the skull through the vertex without contrast.  Comparison: None.  Findings: No mass lesion, mass effect, midline shift, hydrocephalus, hemorrhage.  No acute territorial cortical ischemia/infarct.  Atrophy and chronic ischemic white matter disease is present.  Intracranial atherosclerosis is present.  Calvarium intact.  IMPRESSION: Atrophy and chronic ischemic white matter disease without acute intracranial abnormality.   Original Report Authenticated By: Andreas Newport, M.D.    Dg Chest Port 1 View  02/28/2012  *RADIOLOGY REPORT*  Clinical Data: Cough.  Recent stroke.  PORTABLE CHEST - 1 VIEW  Comparison: Chest x-ray dated 06/09/2011 and chest CT dated 01/04/2012  Findings: Dual lead pacer in place.  Borderline cardiomegaly, chronic.  There are no acute infiltrates or effusions.  The patient does have peribronchial thickening bilaterally consistent with bronchitis.  IMPRESSION: Bronchitic changes.   Original Report Authenticated By: Francene Boyers, M.D.     Microbiology: No results found for this or any previous visit (from the past 240 hour(s)).   Labs: Basic Metabolic Panel:  Lab 02/29/12 9604 02/27/12 0805 02/26/12 0514 02/25/12 2010 02/24/12 0520  NA 141 137 143 140 139  K 4.0 4.6 3.6 3.8 3.4*  CL 105 104 108 105 105  CO2 29 29 29 27 27   GLUCOSE 94 120* 106* 193* 94  BUN 20 12 20  24* 22  CREATININE 0.96 0.78 0.88 1.01 0.93  CALCIUM 8.5 8.5 8.5 8.8 8.0*  MG -- -- -- -- --  PHOS -- -- -- -- --   Liver Function Tests:  Lab 02/29/12 0446 02/26/12 0514 02/25/12 2010  AST 12 12 16   ALT 9 13 16   ALKPHOS 44 48 57  BILITOT 0.2* 0.2* 0.2*  PROT 5.7* 5.9* 6.3  ALBUMIN 2.5* 2.6* 2.7*     CBC:  Lab 02/29/12 0446 02/27/12 0805 02/26/12 0514 02/25/12 2010 02/25/12 0510  WBC 6.5 8.4 7.4 6.7 7.2  NEUTROABS -- -- -- 4.3 --  HGB 8.5* 8.9* 8.0* 8.6* 7.9*  HCT 27.9* 29.9* 25.9* 28.2* 25.8*  MCV 77.3* 77.9* 78.5 77.9* 76.6*  PLT 338 336 250 308 254   Cardiac Enzymes:  Lab 02/25/12 2010  CKTOTAL --  CKMB --  CKMBINDEX --  TROPONINI <0.30   BNP: BNP (last 3 results)  Basename 06/03/11 0120 06/02/11 0941 03/29/11  PROBNP 5920.0* 2652.0* 2526.0*   CBG:  Lab 02/29/12  0713 02/29/12 0409 02/29/12 0019 02/28/12  2017 02/28/12 1708  GLUCAP 88 100* 86 154* 109*       Signed:  GOSRANI,NIMISH C  [facility] 02/29/2012, 10:17 AM

## 2012-02-29 NOTE — Progress Notes (Signed)
Physical Therapy Treatment Patient Details Name: [name] MRN: [id number] DOB: 19-Jul-1922 Today's Date: 02/29/2012 Time: 7829-5621 PT Time Calculation (min): 40 min  PT Assessment / Plan / Recommendation Comments on Treatment Session  Pt progressing well    Follow Up Recommendations        Does the patient have the potential to tolerate intense rehabilitation     Barriers to Discharge        Equipment Recommendations       Recommendations for Other Services    Frequency     Plan Discharge plan remains appropriate;Frequency remains appropriate    Precautions / Restrictions     Pertinent Vitals/Pain     Mobility  Bed Mobility Supine to Sit: 5: Supervision Transfers Sit to Stand: 5: Supervision Stand to Sit: 5: Supervision Ambulation/Gait Ambulation/Gait Assistance: 5: Supervision Ambulation Distance (Feet): 200 Feet Assistive device: Rolling walker Ambulation/Gait Assistance Details: vc still needed for walker placement Gait Pattern: Within Functional Limits Stairs: No Wheelchair Mobility Wheelchair Mobility: No    Exercises General Exercises - Lower Extremity Ankle Circles/Pumps: AROM;Both;10 reps;Supine Quad Sets: AROM;Both;10 reps;Supine Gluteal Sets: AROM;Both;10 reps;Supine Short Arc Quad: AROM;Both;10 reps;Supine Heel Slides: Both;10 reps;Supine Hip ABduction/ADduction: Strengthening;Both;10 reps;Supine   PT Diagnosis:    PT Problem List:   PT Treatment Interventions:     PT Goals Acute Rehab PT Goals PT Goal: Sit to Stand - Progress: Progressing toward goal PT Goal: Stand to Sit - Progress: Progressing toward goal PT Goal: Ambulate - Progress: Progressing toward goal PT Goal: Up/Down Stairs - Progress: Not met  Visit Information  Last PT Received On: 02/29/12    Subjective Data  Subjective: no c/o...feeling better   Cognition       Balance  Balance Balance Assessed: No  End of Session PT - End of Session Equipment Utilized During  Treatment: Gait belt Activity Tolerance: Patient tolerated treatment well Patient left: in chair;with call bell/phone within reach;with chair alarm set   GP     Konrad Penta 02/29/2012, 9:48 AM

## 2012-03-03 ENCOUNTER — Encounter (INDEPENDENT_AMBULATORY_CARE_PROVIDER_SITE_OTHER): Payer: Self-pay | Admitting: General Surgery

## 2012-03-03 ENCOUNTER — Ambulatory Visit (INDEPENDENT_AMBULATORY_CARE_PROVIDER_SITE_OTHER): Payer: Medicare Other | Admitting: General Surgery

## 2012-03-03 VITALS — BP 96/62 | HR 60 | Resp 20 | Ht 65.0 in | Wt 178.0 lb

## 2012-03-03 DIAGNOSIS — Z09 Encounter for follow-up examination after completed treatment for conditions other than malignant neoplasm: Secondary | ICD-10-CM

## 2012-03-03 NOTE — Patient Instructions (Signed)
Steri-strips will fall off incision Eat well

## 2012-03-03 NOTE — Progress Notes (Signed)
Subjective:     Patient ID: [name], female   DOB: [birth year], 78 y.o.   MRN: [id number]  HPI [name] is an 78 year old Caucasian female who comes in for her postoperative appointment. She underwent a laparoscopic converted to open right hemicolectomy and lysis of adhesions for colon cancer on December 6. She was in the hospital until December 12. She did receive 2 units of packed blood cells postoperatively for acute blood loss anemia. Her Coumadin was held postoperatively because of her transfusion requirement. On day of discharge she was transferred to the [facility] for skilled nursing care. Unfortunately that evening around supper time the patient's son noticed that the patient was slurring her words and not moving her right side as well. She was transferred urgently to the emergency department she was found to have symptoms consistent with stroke. She was unable to have an MRI due to her pacemaker. 2 CT scans of her head did not show an acute infarct. She was discharged from the hospital on the 16th back to the [facility]. Her deficits quickly resolved within about 24 hours. She states that she has been doing well. She denies any abdominal pain. She denies any nausea or vomiting. She denies any melena, hematochezia, or diarrhea or constipation. She states that her appetite is good. She states that she is still a little bit weak. She has been restarted on her coumadin  Review of Systems     Objective:   Physical Exam BP 96/62  Pulse 60  Resp 20  Ht 5\' 5"  (1.651 m)  Wt 178 lb (80.74 kg)  BMI 29.62 kg/m2  Gen: alert, NAD, non-toxic appearing Pupils: equal, no scleral icterus Pulm: Lungs clear to auscultation, symmetric chest rise CV: regular rate and rhythm - no afib Abd: soft, nontender, nondistended. Well-healedmidline incision. No cellulitis. No incisional hernia Ext: no edema, no calf tenderness Skin: no rash, no jaundice Neuro: ox3, MAE, symmetric strength     Assessment:      T4aN1a right colon cancer s/p open right colectomy, LOA T3N0 sigmoid colon cancer Jan 2013    Plan:     Overall I think she is doing quite well to have had a TIA versus stroke. I removed her surgical skin staples and placed Steri-Strips. We reviewed her pathology report. She is not a candidate for chemotherapy in my opinion. However we will present her at GI tumor board. She was reminded not to do any heavy lifting, pushing or pulling for another 6 weeks. She was encouraged to eat a heart healthy diet. She is encouraged to continue with physical therapy, occupational therapy and speech pathology. Follow up with me in 8 weeks.   Tiffinie Sella. [name], MD, FACS General, Bariatric, & Minimally Invasive Surgery [facility], [place]

## 2012-03-07 NOTE — Progress Notes (Signed)
Sent message to Vicie Mutters asking her to out this pt on the GI board like Dr. [name] asked.

## 2012-03-10 ENCOUNTER — Telehealth (INDEPENDENT_AMBULATORY_CARE_PROVIDER_SITE_OTHER): Payer: Self-pay | Admitting: General Surgery

## 2012-03-10 NOTE — Telephone Encounter (Signed)
Carleen with [facility] called to let us know patient had some yellow/foul smelling drainage from wound. MD at [facility] evaluated patient and put her on doxycycline 100 mg for 7 days. They cleaned up the wound and are keeping it covered. RN states this looks okay. No fevers. Wanted to make [place] aware and told us to call if any other orders were needed.

## 2012-03-11 ENCOUNTER — Telehealth (INDEPENDENT_AMBULATORY_CARE_PROVIDER_SITE_OTHER): Payer: Self-pay | Admitting: General Surgery

## 2012-03-11 NOTE — Telephone Encounter (Signed)
Joelene Millin, nurse at [facility], called to alert Dr. [name] that culture results were available and she will FAX a copy to [facility] this afternoon.  Pt is on Doxycycline now.  Nurse asking if pt needed to be seen sooner than her scheduled appt on 04/22/12.  Instructed nurse to call if pt became worse and will make Dr. [name] aware of new lab results.  Will call [facility] back if any new orders or we need to see her sooner.

## 2012-03-14 ENCOUNTER — Telehealth (INDEPENDENT_AMBULATORY_CARE_PROVIDER_SITE_OTHER): Payer: Self-pay | Admitting: General Surgery

## 2012-03-14 NOTE — Telephone Encounter (Signed)
Dr [name] received report. They are treating her for the MRSA infection. Nothing more to do at this point.

## 2012-03-14 NOTE — Telephone Encounter (Signed)
Son called to let Dr [name] know pt is at the [facility] and currently has MRSA.  She is on Doxycycline.  They were asked to FAX the culture report to Dr. [name] at [facility].

## 2012-03-17 ENCOUNTER — Encounter: Payer: Medicare Other | Admitting: Adult Health

## 2012-03-18 ENCOUNTER — Encounter: Payer: Medicare Other | Admitting: Adult Health

## 2012-03-23 ENCOUNTER — Ambulatory Visit (INDEPENDENT_AMBULATORY_CARE_PROVIDER_SITE_OTHER): Payer: Medicare Other | Admitting: Adult Health

## 2012-03-23 ENCOUNTER — Encounter: Payer: Self-pay | Admitting: Adult Health

## 2012-03-23 VITALS — BP 110/60 | HR 61 | Ht 66.0 in | Wt 180.0 lb

## 2012-03-23 DIAGNOSIS — D649 Anemia, unspecified: Secondary | ICD-10-CM

## 2012-03-23 DIAGNOSIS — Z950 Presence of cardiac pacemaker: Secondary | ICD-10-CM

## 2012-03-23 DIAGNOSIS — I10 Essential (primary) hypertension: Secondary | ICD-10-CM

## 2012-03-23 DIAGNOSIS — I4891 Unspecified atrial fibrillation: Secondary | ICD-10-CM

## 2012-03-23 NOTE — Progress Notes (Signed)
HPI: [name] is a very pleasant 79 y/o patient of Dr. Dietrich Pates we are seeing for ongoing assessment and treatment of atrial fibrillation, SSS s/p PPM followed by Dr.Allred with [employer] Adapta DDDR, diastolic CHF, and hypertension. She was seen during recent hospitalization for acute CVA with no residual hemiparesis in Dec of 2013. She was found to have an episode of Afib with RVR during that admission. Sotolol dose was increased. She has seen Dr.Allred in [place] since discharge for pacemaker interrogation.  She had been held on coumadin for a few days prior to a laparoscopic open right colectomy and lysis of adhesions prior to the CVA. Coumadin was restarted on the recent hospitalization, and continues to have dosing per [facility], Paulene Floor NP.  She is currently at the [facility] for rehab due to deconditioning. She is accompanied by her son today. She is without complaint.  Allergies  Allergen Reactions  . Lasix (Furosemide) Swelling  . Penicillins Nausea And Vomiting and Swelling    Lip swelling and redness  . Sulfa Antibiotics Rash    Current Outpatient Prescriptions  Medication Sig Dispense Refill  . [DISCONTINUED] famotidine (PEPCID) 20 MG tablet Take 1 tablet (20 mg total) by mouth 2 (two) times daily.      . [DISCONTINUED] promethazine (PHENERGAN) 12.5 MG tablet Take 12.5 mg by mouth every 6 (six) hours as needed.        Past Medical History  Diagnosis Date  . Diastolic heart failure   . Ovarian cancer   . Colon cancer   . Arthritis   . History of Clostridium difficile infection     in Jan 2013  . Pacemaker   . History of cellulitis and abscess   . Hypertension     takes Ramipril,Diltiazem daily  . Atrial fibrillation     takes Coumadin daily  . Tachy-brady syndrome 2008    s/p PPM implantation by Dr Gifford Shave takes Betapace daily  . Osteoporosis     takes Prolia every 6months injections  . Shortness of breath     with exertion;Albuterol prn  . Bruises easily    takes COumadin daily  . GERD (gastroesophageal reflux disease)     takes Protonix daily  . Constipation     Milk of Mag nightly  . Anemia     takes Iron daily    Past Surgical History  Procedure Date  . Abdominal hysterectomy   . Oophorectomy   . Colonoscopy 03/30/2011    Procedure: COLONOSCOPY;  Surgeon: Barrie Folk, MD;  Location: [facility] ENDOSCOPY;  Service: Endoscopy;  Laterality: N/A;  . Esophagogastroduodenoscopy 03/30/2011    Procedure: ESOPHAGOGASTRODUODENOSCOPY (EGD);  Surgeon: Barrie Folk, MD;  Location: [facility] ENDOSCOPY;  Service: Endoscopy;  Laterality: N/A;  . Colon resection 04/03/2011    Procedure: COLON RESECTION LAPAROSCOPIC;  Surgeon: Atilano Ina, MD;  Location: [facility] OR;  Service: General;  Laterality: N/A;  Laparoscopic, turned open sigmoid colon resection, lysis of adhesions x 2.5 hours, rigid proctoscopy.  . Pacemaker placement 2008    MDT EnRhythm implanted by Dr Amil Amen, generator change (MDT Adapta L) by Dr Johney Frame 08/04/11  . Colonoscopy 12/24/2011    Procedure: COLONOSCOPY;  Surgeon: Malissa Hippo, MD;  Location: [facility] ENDO SUITE;  Service: Endoscopy;  Laterality: N/A;  . Bilateral cataracts   . Battery changed in pacemaker   . Laparoscopic right hemi colectomy 02/19/2012    Procedure: LAPAROSCOPIC RIGHT HEMI COLECTOMY;  Surgeon: Atilano Ina, MD,FACS;  Location: [facility] OR;  Service: General;  Laterality: Right;  attempted laparoscopic assisted right hemi colectomy converted to open.  . Partial colectomy 02/19/2012    Procedure: PARTIAL COLECTOMY;  Surgeon: Atilano Ina, MD,FACS;  Location: [facility] OR;  Service: General;  Laterality: Right;  . Lysis of adhesion 02/19/2012    Procedure: LYSIS OF ADHESION;  Surgeon: Atilano Ina, MD,FACS;  Location: [facility] OR;  Service: General;  Laterality: N/A;    NWG:NFAOZH of systems complete and found to be negative unless listed above  PHYSICAL EXAM BP 110/60  Pulse 61  Ht 5\' 6"  (1.676 m)  Wt 180 lb (81.647 kg)  BMI 29.05 kg/m2  SpO2  96%  General: Well developed, well nourished, in no acute distress. Pale  Head: Eyes PERRLA, No xanthomas.   Normal cephalic and atramatic  Lungs: Clear bilaterally to auscultation and percussion. Heart: HRRR S1 S2, without MRG.  Pulses are 2+ & equal.            No carotid bruit. No JVD.  No abdominal bruits. No femoral bruits. Abdomen: Bowel sounds are positive, abdomen soft and non-tender without masses or                  Hernia's noted. Msk:  Back normal, unable to assess gait as she is in a wheelchair today.. Normal strength and tone for age. Extremities: No clubbing, cyanosis or edema.  DP +1 Neuro: Alert and oriented X 3. Psych:  Good affect, responds appropriately  EKG: Paced rhythm at 60 bpm.  ASSESSMENT AND PLAN

## 2012-03-23 NOTE — Assessment & Plan Note (Signed)
Heart rate is controlled on sotolol 240 mg and diltiazem. She is followed by [facility] for coumadin dosing. No changes in regimen at this time. I will have CBC drawn in the setting of anemia with last Hgb 8.5 on discharge from [facility] as she is on coumadin.

## 2012-03-23 NOTE — Assessment & Plan Note (Signed)
Hgb 8.45 on discharge. Will follow up with CBC today for comparison. She will continue iron replacement.

## 2012-03-23 NOTE — Progress Notes (Deleted)
Name: [name]    DOB: 1922-12-08  Age: 79 y.o.  MR#: [id number]       PCP:  Bennie Pierini, FNP      Insurance: @PAYORNAME @   CC:   No chief complaint on file.   VS BP 110/60  Pulse 61  Ht 5\' 6"  (1.676 m)  Wt 180 lb (81.647 kg)  BMI 29.05 kg/m2  SpO2 96%  Weights Current Weight  03/23/12 180 lb (81.647 kg)  03/03/12 178 lb (80.74 kg)  02/25/12 192 lb 15.9 oz (87.54 kg)    Blood Pressure  BP Readings from Last 3 Encounters:  03/23/12 110/60  03/03/12 96/62  02/29/12 118/52     Admit date:  (Not on file) Last encounter with RMR:  Visit date not found   Allergy Allergies  Allergen Reactions  . Lasix (Furosemide) Swelling  . Penicillins Nausea And Vomiting and Swelling    Lip swelling and redness  . Sulfa Antibiotics Rash    Current Outpatient Prescriptions  Medication Sig Dispense Refill  . [DISCONTINUED] famotidine (PEPCID) 20 MG tablet Take 1 tablet (20 mg total) by mouth 2 (two) times daily.      . [DISCONTINUED] promethazine (PHENERGAN) 12.5 MG tablet Take 12.5 mg by mouth every 6 (six) hours as needed.        Discontinued Meds:   There are no discontinued medications.  Patient Active Problem List  Diagnosis  . Pacemaker-Medtronic  . Edema  . Hypertension  . Atrial fibrillation  . IBS (irritable bowel syndrome)  . Osteoporosis  . Atrial flutter with rapid ventricular response  . Ovarian cancer  . Anemia  . Cancer of sigmoid colon s/p sigmoid colectomy for T3N0 lesion on 1/18  . Tachycardia-bradycardia  . Cancer of right colon -02/2012  . Stroke  . Hyperglycemia    LABS Admission on 02/25/2012, Discharged on 02/29/2012  Component Date Value  . Prothrombin Time 02/25/2012 14.7   . INR 02/25/2012 1.17   . aPTT 02/25/2012 28   . WBC 02/25/2012 6.7   . RBC 02/25/2012 3.62*  . Hemoglobin 02/25/2012 8.6*  . HCT 02/25/2012 28.2*  . MCV 02/25/2012 77.9*  . MCH 02/25/2012 23.8*  . MCHC 02/25/2012 30.5   . RDW 02/25/2012 21.5*  .  Platelets 02/25/2012 308   . Neutrophils Relative 02/25/2012 64   . Neutro Abs 02/25/2012 4.3   . Lymphocytes Relative 02/25/2012 19   . Lymphs Abs 02/25/2012 1.3   . Monocytes Relative 02/25/2012 12   . Monocytes Absolute 02/25/2012 0.8   . Eosinophils Relative 02/25/2012 5   . Eosinophils Absolute 02/25/2012 0.3   . Basophils Relative 02/25/2012 1   . Basophils Absolute 02/25/2012 0.0   . Sodium 02/25/2012 140   . Potassium 02/25/2012 3.8   . Chloride 02/25/2012 105   . CO2 02/25/2012 27   . Glucose, Bld 02/25/2012 193*  . BUN 02/25/2012 24*  . Creatinine, Ser 02/25/2012 1.01   . Calcium 02/25/2012 8.8   . Total Protein 02/25/2012 6.3   . Albumin 02/25/2012 2.7*  . AST 02/25/2012 16   . ALT 02/25/2012 16   . Alkaline Phosphatase 02/25/2012 57   . Total Bilirubin 02/25/2012 0.2*  . GFR calc non Af Amer 02/25/2012 49*  . GFR calc Af Amer 02/25/2012 56*  . Troponin I 02/25/2012 <0.30   . Glucose-Capillary 02/25/2012 151*  . Hemoglobin A1C 02/26/2012 5.8*  . Mean Plasma Glucose 02/26/2012 120*  . Cholesterol 02/26/2012 124   . Triglycerides 02/26/2012  100   . HDL 02/26/2012 43   . Total CHOL/HDL Ratio 02/26/2012 2.9   . VLDL 02/26/2012 20   . LDL Cholesterol 02/26/2012 61   . WBC 02/26/2012 7.4   . RBC 02/26/2012 3.30*  . Hemoglobin 02/26/2012 8.0*  . HCT 02/26/2012 25.9*  . MCV 02/26/2012 78.5   . MCH 02/26/2012 24.2*  . MCHC 02/26/2012 30.9   . RDW 02/26/2012 21.9*  . Platelets 02/26/2012 250   . Sodium 02/26/2012 143   . Potassium 02/26/2012 3.6   . Chloride 02/26/2012 108   . CO2 02/26/2012 29   . Glucose, Bld 02/26/2012 106*  . BUN 02/26/2012 20   . Creatinine, Ser 02/26/2012 0.88   . Calcium 02/26/2012 8.5   . Total Protein 02/26/2012 5.9*  . Albumin 02/26/2012 2.6*  . AST 02/26/2012 12   . ALT 02/26/2012 13   . Alkaline Phosphatase 02/26/2012 48   . Total Bilirubin 02/26/2012 0.2*  . GFR calc non Af Amer 02/26/2012 57*  . GFR calc Af Amer 02/26/2012  67*  . Glucose-Capillary 02/26/2012 95   . Glucose-Capillary 02/26/2012 128*  . Comment 1 02/26/2012 Documented in Chart   . Comment 2 02/26/2012 Notify RN   . Glucose-Capillary 02/26/2012 94   . Glucose-Capillary 02/26/2012 110*  . WBC 02/27/2012 8.4   . RBC 02/27/2012 3.84*  . Hemoglobin 02/27/2012 8.9*  . HCT 02/27/2012 29.9*  . MCV 02/27/2012 77.9*  . MCH 02/27/2012 23.2*  . MCHC 02/27/2012 29.8*  . RDW 02/27/2012 21.3*  . Platelets 02/27/2012 336   . Sodium 02/27/2012 137   . Potassium 02/27/2012 4.6   . Chloride 02/27/2012 104   . CO2 02/27/2012 29   . Glucose, Bld 02/27/2012 120*  . BUN 02/27/2012 12   . Creatinine, Ser 02/27/2012 0.78   . Calcium 02/27/2012 8.5   . GFR calc non Af Amer 02/27/2012 73*  . GFR calc Af Amer 02/27/2012 85*  . Prothrombin Time 02/27/2012 14.2   . INR 02/27/2012 1.11   . Glucose-Capillary 02/26/2012 148*  . Glucose-Capillary 02/27/2012 123*  . Glucose-Capillary 02/27/2012 158*  . Glucose-Capillary 02/27/2012 109*  . Comment 1 02/27/2012 Notify RN   . Comment 2 02/27/2012 Documented in Chart   . Glucose-Capillary 02/27/2012 126*  . Glucose-Capillary 02/27/2012 92   . Comment 1 02/27/2012 Documented in Chart   . Comment 2 02/27/2012 Notify RN   . Prothrombin Time 02/28/2012 17.0*  . INR 02/28/2012 1.42   . Glucose-Capillary 02/27/2012 147*  . Glucose-Capillary 02/28/2012 139*  . Glucose-Capillary 02/28/2012 132*  . Glucose-Capillary 02/28/2012 118*  . Glucose-Capillary 02/28/2012 113*  . Prothrombin Time 02/29/2012 18.4*  . INR 02/29/2012 1.58*  . WBC 02/29/2012 6.5   . RBC 02/29/2012 3.61*  . Hemoglobin 02/29/2012 8.5*  . HCT 02/29/2012 27.9*  . MCV 02/29/2012 77.3*  . [facility] 02/29/2012 23.5*  . MCHC 02/29/2012 30.5   . RDW 02/29/2012 21.8*  . Platelets 02/29/2012 338   . Sodium 02/29/2012 141   . Potassium 02/29/2012 4.0   . Chloride 02/29/2012 105   . CO2 02/29/2012 29   . Glucose, Bld 02/29/2012 94   . BUN 02/29/2012 20     . Creatinine, Ser 02/29/2012 0.96   . Calcium 02/29/2012 8.5   . Total Protein 02/29/2012 5.7*  . Albumin 02/29/2012 2.5*  . AST 02/29/2012 12   . ALT 02/29/2012 9   . Alkaline Phosphatase 02/29/2012 44   . Total Bilirubin 02/29/2012 0.2*  . GFR calc non  Af Amer 02/29/2012 52*  . GFR calc Af Amer 02/29/2012 60*  . Glucose-Capillary 02/28/2012 109*  . Glucose-Capillary 02/28/2012 154*  . Glucose-Capillary 02/29/2012 86   . Glucose-Capillary 02/29/2012 100*  . Glucose-Capillary 02/29/2012 88   . Vitamin B-12 02/29/2012 1022*  . Folate 02/29/2012 17.6   . Iron 02/29/2012 17*  . TIBC 02/29/2012 346   . Saturation Ratios 02/29/2012 5*  . UIBC 02/29/2012 329   . Ferritin 02/29/2012 34   . Retic Ct Pct 02/29/2012 1.9   . RBC. 02/29/2012 3.63*  . Retic Count, Manual 02/29/2012 69.0   . Glucose-Capillary 02/29/2012 163*  . Comment 1 02/29/2012 Documented in Chart   . Comment 2 02/29/2012 Notify RN   Admission on 02/19/2012, Discharged on 02/25/2012  No results displayed because visit has over 200 results.    Admission on 12/22/2011, Discharged on 12/24/2011  Component Date Value  . WBC 12/22/2011 5.7   . RBC 12/22/2011 3.48*  . Hemoglobin 12/22/2011 7.1*  . HCT 12/22/2011 25.2*  . MCV 12/22/2011 72.4*  . MCH 12/22/2011 20.4*  . MCHC 12/22/2011 28.2*  . RDW 12/22/2011 16.5*  . Platelets 12/22/2011 320   . Neutrophils Relative 12/22/2011 70   . Neutro Abs 12/22/2011 4.0   . Lymphocytes Relative 12/22/2011 21   . Lymphs Abs 12/22/2011 1.2   . Monocytes Relative 12/22/2011 8   . Monocytes Absolute 12/22/2011 0.5   . Eosinophils Relative 12/22/2011 1   . Eosinophils Absolute 12/22/2011 0.1   . Basophils Relative 12/22/2011 0   . Basophils Absolute 12/22/2011 0.0   . Sodium 12/22/2011 140   . Potassium 12/22/2011 3.9   . Chloride 12/22/2011 103   . CO2 12/22/2011 28   . Glucose, Bld 12/22/2011 104*  . BUN 12/22/2011 29*  . Creatinine, Ser 12/22/2011 1.01   . Calcium  12/22/2011 9.4   . GFR calc non Af Amer 12/22/2011 49*  . GFR calc Af Amer 12/22/2011 56*  . Color, Urine 12/22/2011 YELLOW   . APPearance 12/22/2011 CLEAR   . Specific Gravity, Urine 12/22/2011 1.010   . pH 12/22/2011 6.5   . Glucose, UA 12/22/2011 NEGATIVE   . Hgb urine dipstick 12/22/2011 NEGATIVE   . Bilirubin Urine 12/22/2011 NEGATIVE   . Ketones, ur 12/22/2011 NEGATIVE   . Protein, ur 12/22/2011 NEGATIVE   . Urobilinogen, UA 12/22/2011 0.2   . Nitrite 12/22/2011 NEGATIVE   . Leukocytes, UA 12/22/2011 NEGATIVE   . ABO/RH(D) 12/22/2011 O POS   . Antibody Screen 12/22/2011 NEG   . Sample Expiration 12/22/2011 12/25/2011   . Unit Number 12/22/2011 [id number]   . Blood Component Type 12/22/2011 RED CELLS,LR   . Unit division 12/22/2011 00   . Status of Unit 12/22/2011 ISSUED,FINAL   . Transfusion Status 12/22/2011 OK TO TRANSFUSE   . Crossmatch Result 12/22/2011 Compatible   . Unit Number 12/22/2011 [id number]   . Blood Component Type 12/22/2011 RED CELLS,LR   . Unit division 12/22/2011 00   . Status of Unit 12/22/2011 ISSUED,FINAL   . Transfusion Status 12/22/2011 OK TO TRANSFUSE   . Crossmatch Result 12/22/2011 Compatible   . Order Confirmation 12/22/2011 ORDER PROCESSED BY BLOOD BANK   . ABO/RH(D) 12/22/2011 O POS   . aPTT 12/22/2011 31   . Prothrombin Time 12/22/2011 23.5*  . INR 12/22/2011 2.20*  . Vitamin B-12 12/22/2011 261   . Folate 12/22/2011 >20.0   . Iron 12/22/2011 17*  . TIBC 12/22/2011 508*  . Saturation Ratios 12/22/2011 3*  .  UIBC 12/22/2011 491*  . Ferritin 12/22/2011 <1*  . Retic Ct Pct 12/22/2011 1.7   . RBC. 12/22/2011 3.50*  . Retic Count, Manual 12/22/2011 59.5   . Sodium 12/23/2011 138   . Potassium 12/23/2011 4.1   . Chloride 12/23/2011 104   . CO2 12/23/2011 28   . Glucose, Bld 12/23/2011 83   . BUN 12/23/2011 22   . Creatinine, Ser 12/23/2011 0.98   . Calcium 12/23/2011 8.8   . Total Protein 12/23/2011 6.5   . Albumin 12/23/2011  3.3*  . AST 12/23/2011 16   . ALT 12/23/2011 8   . Alkaline Phosphatase 12/23/2011 45   . Total Bilirubin 12/23/2011 0.4   . GFR calc non Af Amer 12/23/2011 50*  . GFR calc Af Amer 12/23/2011 58*  . WBC 12/23/2011 4.5   . RBC 12/23/2011 3.86*  . Hemoglobin 12/23/2011 8.9*  . HCT 12/23/2011 29.0*  . MCV 12/23/2011 75.1*  . MCH 12/23/2011 23.1*  . MCHC 12/23/2011 30.7   . RDW 12/23/2011 17.8*  . Platelets 12/23/2011 280   . Prothrombin Time 12/23/2011 22.6*  . INR 12/23/2011 2.09*  . Fecal Occult Bld 12/22/2011 POSITIVE   . Fecal Occult Bld 12/22/2011 POSITIVE   . Hemoglobin 12/24/2011 9.6*  . HCT 12/24/2011 31.4*  . Prothrombin Time 12/24/2011 21.2*  . INR 12/24/2011 1.92*  . CEA 12/24/2011 10.0*  . Prothrombin Time 12/24/2011 18.9*  . INR 12/24/2011 1.64*     Results for this Opt Visit:     Results for orders placed during the hospital encounter of 02/25/12  PROTIME-INR      Component Value Range   Prothrombin Time 14.7  11.6 - 15.2 seconds   INR 1.17  0.00 - 1.49  APTT      Component Value Range   aPTT 28  24 - 37 seconds  CBC      Component Value Range   WBC 6.7  4.0 - 10.5 K/uL   RBC 3.62 (*) 3.87 - 5.11 MIL/uL   Hemoglobin 8.6 (*) 12.0 - 15.0 g/dL   HCT 16.1 (*) 09.6 - 04.5 %   MCV 77.9 (*) 78.0 - 100.0 fL   MCH 23.8 (*) 26.0 - 34.0 pg   MCHC 30.5  30.0 - 36.0 g/dL   RDW 40.9 (*) 81.1 - 91.4 %   Platelets 308  150 - 400 K/uL  DIFFERENTIAL      Component Value Range   Neutrophils Relative 64  43 - 77 %   Neutro Abs 4.3  1.7 - 7.7 K/uL   Lymphocytes Relative 19  12 - 46 %   Lymphs Abs 1.3  0.7 - 4.0 K/uL   Monocytes Relative 12  3 - 12 %   Monocytes Absolute 0.8  0.1 - 1.0 K/uL   Eosinophils Relative 5  0 - 5 %   Eosinophils Absolute 0.3  0.0 - 0.7 K/uL   Basophils Relative 1  0 - 1 %   Basophils Absolute 0.0  0.0 - 0.1 K/uL  COMPREHENSIVE METABOLIC PANEL      Component Value Range   Sodium 140  135 - 145 mEq/L   Potassium 3.8  3.5 - 5.1 mEq/L    Chloride 105  96 - 112 mEq/L   CO2 27  19 - 32 mEq/L   Glucose, Bld 193 (*) 70 - 99 mg/dL   BUN 24 (*) 6 - 23 mg/dL   Creatinine, Ser 7.82  0.50 - 1.10 mg/dL   Calcium 8.8  8.4 - 10.5 mg/dL   Total Protein 6.3  6.0 - 8.3 g/dL   Albumin 2.7 (*) 3.5 - 5.2 g/dL   AST 16  0 - 37 U/L   ALT 16  0 - 35 U/L   Alkaline Phosphatase 57  39 - 117 U/L   Total Bilirubin 0.2 (*) 0.3 - 1.2 mg/dL   GFR calc non Af Amer 49 (*) >90 mL/min   GFR calc Af Amer 56 (*) >90 mL/min  TROPONIN I      Component Value Range   Troponin I <0.30  <0.30 ng/mL  GLUCOSE, CAPILLARY      Component Value Range   Glucose-Capillary 151 (*) 70 - 99 mg/dL  HEMOGLOBIN Z6X      Component Value Range   Hemoglobin A1C 5.8 (*) <5.7 %   Mean Plasma Glucose 120 (*) <117 mg/dL  LIPID PANEL      Component Value Range   Cholesterol 124  0 - 200 mg/dL   Triglycerides 096  <045 mg/dL   HDL 43  >40 mg/dL   Total CHOL/HDL Ratio 2.9     VLDL 20  0 - 40 mg/dL   LDL Cholesterol 61  0 - 99 mg/dL  CBC      Component Value Range   WBC 7.4  4.0 - 10.5 K/uL   RBC 3.30 (*) 3.87 - 5.11 MIL/uL   Hemoglobin 8.0 (*) 12.0 - 15.0 g/dL   HCT 98.1 (*) 19.1 - 47.8 %   MCV 78.5  78.0 - 100.0 fL   MCH 24.2 (*) 26.0 - 34.0 pg   MCHC 30.9  30.0 - 36.0 g/dL   RDW 29.5 (*) 62.1 - 30.8 %   Platelets 250  150 - 400 K/uL  COMPREHENSIVE METABOLIC PANEL      Component Value Range   Sodium 143  135 - 145 mEq/L   Potassium 3.6  3.5 - 5.1 mEq/L   Chloride 108  96 - 112 mEq/L   CO2 29  19 - 32 mEq/L   Glucose, Bld 106 (*) 70 - 99 mg/dL   BUN 20  6 - 23 mg/dL   Creatinine, Ser 6.57  0.50 - 1.10 mg/dL   Calcium 8.5  8.4 - 84.6 mg/dL   Total Protein 5.9 (*) 6.0 - 8.3 g/dL   Albumin 2.6 (*) 3.5 - 5.2 g/dL   AST 12  0 - 37 U/L   ALT 13  0 - 35 U/L   Alkaline Phosphatase 48  39 - 117 U/L   Total Bilirubin 0.2 (*) 0.3 - 1.2 mg/dL   GFR calc non Af Amer 57 (*) >90 mL/min   GFR calc Af Amer 67 (*) >90 mL/min  GLUCOSE, CAPILLARY      Component Value  Range   Glucose-Capillary 95  70 - 99 mg/dL  GLUCOSE, CAPILLARY      Component Value Range   Glucose-Capillary 128 (*) 70 - 99 mg/dL   Comment 1 Documented in Chart     Comment 2 Notify RN    GLUCOSE, CAPILLARY      Component Value Range   Glucose-Capillary 94  70 - 99 mg/dL  GLUCOSE, CAPILLARY      Component Value Range   Glucose-Capillary 110 (*) 70 - 99 mg/dL  CBC      Component Value Range   WBC 8.4  4.0 - 10.5 K/uL   RBC 3.84 (*) 3.87 - 5.11 MIL/uL   Hemoglobin 8.9 (*) 12.0 - 15.0 g/dL  HCT 29.9 (*) 36.0 - 46.0 %   MCV 77.9 (*) 78.0 - 100.0 fL   MCH 23.2 (*) 26.0 - 34.0 pg   MCHC 29.8 (*) 30.0 - 36.0 g/dL   RDW 40.9 (*) 81.1 - 91.4 %   Platelets 336  150 - 400 K/uL  BASIC METABOLIC PANEL      Component Value Range   Sodium 137  135 - 145 mEq/L   Potassium 4.6  3.5 - 5.1 mEq/L   Chloride 104  96 - 112 mEq/L   CO2 29  19 - 32 mEq/L   Glucose, Bld 120 (*) 70 - 99 mg/dL   BUN 12  6 - 23 mg/dL   Creatinine, Ser 7.82  0.50 - 1.10 mg/dL   Calcium 8.5  8.4 - 95.6 mg/dL   GFR calc non Af Amer 73 (*) >90 mL/min   GFR calc Af Amer 85 (*) >90 mL/min  PROTIME-INR      Component Value Range   Prothrombin Time 14.2  11.6 - 15.2 seconds   INR 1.11  0.00 - 1.49  GLUCOSE, CAPILLARY      Component Value Range   Glucose-Capillary 148 (*) 70 - 99 mg/dL  GLUCOSE, CAPILLARY      Component Value Range   Glucose-Capillary 123 (*) 70 - 99 mg/dL  GLUCOSE, CAPILLARY      Component Value Range   Glucose-Capillary 158 (*) 70 - 99 mg/dL  GLUCOSE, CAPILLARY      Component Value Range   Glucose-Capillary 109 (*) 70 - 99 mg/dL   Comment 1 Notify RN     Comment 2 Documented in Chart    GLUCOSE, CAPILLARY      Component Value Range   Glucose-Capillary 126 (*) 70 - 99 mg/dL  GLUCOSE, CAPILLARY      Component Value Range   Glucose-Capillary 92  70 - 99 mg/dL   Comment 1 Documented in Chart     Comment 2 Notify RN    PROTIME-INR      Component Value Range   Prothrombin Time 17.0 (*)  11.6 - 15.2 seconds   INR 1.42  0.00 - 1.49  GLUCOSE, CAPILLARY      Component Value Range   Glucose-Capillary 147 (*) 70 - 99 mg/dL  GLUCOSE, CAPILLARY      Component Value Range   Glucose-Capillary 139 (*) 70 - 99 mg/dL  GLUCOSE, CAPILLARY      Component Value Range   Glucose-Capillary 132 (*) 70 - 99 mg/dL  GLUCOSE, CAPILLARY      Component Value Range   Glucose-Capillary 118 (*) 70 - 99 mg/dL  GLUCOSE, CAPILLARY      Component Value Range   Glucose-Capillary 113 (*) 70 - 99 mg/dL  PROTIME-INR      Component Value Range   Prothrombin Time 18.4 (*) 11.6 - 15.2 seconds   INR 1.58 (*) 0.00 - 1.49  CBC      Component Value Range   WBC 6.5  4.0 - 10.5 K/uL   RBC 3.61 (*) 3.87 - 5.11 MIL/uL   Hemoglobin 8.5 (*) 12.0 - 15.0 g/dL   HCT 21.3 (*) 08.6 - 57.8 %   MCV 77.3 (*) 78.0 - 100.0 fL   MCH 23.5 (*) 26.0 - 34.0 pg   MCHC 30.5  30.0 - 36.0 g/dL   RDW 46.9 (*) 62.9 - 52.8 %   Platelets 338  150 - 400 K/uL  COMPREHENSIVE METABOLIC PANEL      Component Value  Range   Sodium 141  135 - 145 mEq/L   Potassium 4.0  3.5 - 5.1 mEq/L   Chloride 105  96 - 112 mEq/L   CO2 29  19 - 32 mEq/L   Glucose, Bld 94  70 - 99 mg/dL   BUN 20  6 - 23 mg/dL   Creatinine, Ser 1.61  0.50 - 1.10 mg/dL   Calcium 8.5  8.4 - 09.6 mg/dL   Total Protein 5.7 (*) 6.0 - 8.3 g/dL   Albumin 2.5 (*) 3.5 - 5.2 g/dL   AST 12  0 - 37 U/L   ALT 9  0 - 35 U/L   Alkaline Phosphatase 44  39 - 117 U/L   Total Bilirubin 0.2 (*) 0.3 - 1.2 mg/dL   GFR calc non Af Amer 52 (*) >90 mL/min   GFR calc Af Amer 60 (*) >90 mL/min  GLUCOSE, CAPILLARY      Component Value Range   Glucose-Capillary 109 (*) 70 - 99 mg/dL  GLUCOSE, CAPILLARY      Component Value Range   Glucose-Capillary 154 (*) 70 - 99 mg/dL  GLUCOSE, CAPILLARY      Component Value Range   Glucose-Capillary 86  70 - 99 mg/dL  GLUCOSE, CAPILLARY      Component Value Range   Glucose-Capillary 100 (*) 70 - 99 mg/dL  GLUCOSE, CAPILLARY      Component  Value Range   Glucose-Capillary 88  70 - 99 mg/dL  VITAMIN E45      Component Value Range   Vitamin B-12 1022 (*) 211 - 911 pg/mL  FOLATE      Component Value Range   Folate 17.6    IRON AND TIBC      Component Value Range   Iron 17 (*) 42 - 135 ug/dL   TIBC 409  811 - 914 ug/dL   Saturation Ratios 5 (*) 20 - 55 %   UIBC 329  125 - 400 ug/dL  FERRITIN      Component Value Range   Ferritin 34  10 - 291 ng/mL  RETICULOCYTES      Component Value Range   Retic Ct Pct 1.9  0.4 - 3.1 %   RBC. 3.63 (*) 3.87 - 5.11 MIL/uL   Retic Count, Manual 69.0  19.0 - 186.0 K/uL  GLUCOSE, CAPILLARY      Component Value Range   Glucose-Capillary 163 (*) 70 - 99 mg/dL   Comment 1 Documented in Chart     Comment 2 Notify RN      EKG Orders placed in visit on 03/23/12  . EKG 12-LEAD     Prior Assessment and Plan Problem List as of 03/23/2012            Cardiology Problems   Hypertension   Last Assessment & Plan Note   07/29/2011 Office Visit Signed 07/30/2011  8:58 PM by Hillis Range, MD    Stable No change required today     Atrial fibrillation   Last Assessment & Plan Note   11/04/2011 Office Visit Signed 11/04/2011 11:26 AM by Hillis Range, MD    Stable No changes today  Continue coumadin long term.    Atrial flutter with rapid ventricular response   Tachycardia-bradycardia   Last Assessment & Plan Note   11/04/2011 Office Visit Signed 11/04/2011 11:26 AM by Hillis Range, MD    Doing well s/p generator change  Normal pacemaker function See Pace Art report No changes today  She  wishes to have her device followed in our [facility] device clinic which is much closer to her that Dr Hoyle Barr office in [place]. I have informed her that I am happy to follow her pacemaker but have encouraged her to continue to follow with Dr Eldridge Dace for her routine cardiology care.    Stroke     Other   Pacemaker-[employer]tr[employer]   Edema   IBS (irritable bowel syndrome)   Osteoporosis   Ovarian cancer     Anemia   Cancer of sigmoid colon s/p sigmoid colectomy for T3N0 lesion on 1/18   Cancer of right colon -02/2012   Hyperglycemia       Imaging: Ct Head Wo Contrast  02/27/2012  *RADIOLOGY REPORT*  Clinical Data: Evaluate for stroke.  Altered mental status.  The patient's speech appears affected. Pacemaker.  CT HEAD WITHOUT CONTRAST  Technique:  Contiguous axial images were obtained from the base of the skull through the vertex without contrast.  Comparison: 02/25/2012  Findings: There is no evidence for acute infarction, intracranial hemorrhage, mass lesion, hydrocephalus, or extra-axial fluid. Moderate atrophy is present with chronic microvascular ischemic change.  The calvarium remains intact.  Sinuses and mastoids are clear.  Compared with the previous study I do not see evidence for interval development of cortical hypodensity in the day.  Moderate vascular calcification is again noted in the carotid siphon regions.  IMPRESSION: Moderate atrophy and chronic microvascular ischemic change.  No visible acute stroke or bleed.   Original Report Authenticated By: Davonna Belling, M.D.    Ct Head Wo Contrast  02/25/2012  *RADIOLOGY REPORT*  Clinical Data: Blurry vision.  Possible stroke.  Left sided facial droop.  CT HEAD WITHOUT CONTRAST  Technique:  Contiguous axial images were obtained from the base of the skull through the vertex without contrast.  Comparison: None.  Findings: No mass lesion, mass effect, midline shift, hydrocephalus, hemorrhage.  No acute territorial cortical ischemia/infarct. Atrophy and chronic ischemic white matter disease is present.  Intracranial atherosclerosis is present.  Calvarium intact.  IMPRESSION: Atrophy and chronic ischemic white matter disease without acute intracranial abnormality.   Original Report Authenticated By: Andreas Newport, M.D.    US Carotid Duplex Bilateral  02/29/2012  *RADIOLOGY REPORT*  Clinical Data: Acute aphasia  BILATERAL CAROTID DUPLEX ULTRASOUND   Technique: Gray scale imaging, color Doppler and duplex ultrasound was performed of bilateral carotid and vertebral arteries in the neck.  Comparison:  None.  Criteria:  Quantification of carotid stenosis is based on velocity parameters that correlate the residual internal carotid diameter with NASCET-based stenosis levels, using the diameter of the distal internal carotid lumen as the denominator for stenosis measurement.  The following velocity measurements were obtained:                   PEAK SYSTOLIC/END DIASTOLIC RIGHT ICA:                        99cm/sec CCA:                        87cm/sec SYSTOLIC ICA/CCA RATIO:     1.13 DIASTOLIC ICA/CCA RATIO: ECA:                        105cm/sec  LEFT ICA:                        99cm/sec CCA:  97cm/sec SYSTOLIC ICA/CCA RATIO:     1.02 DIASTOLIC ICA/CCA RATIO: ECA:                        96cm/sec  Findings:  RIGHT CAROTID ARTERY: Mild calcified plaque in the bulb.  Low resistance internal carotid Doppler pattern.  Some turbulence is noted.  RIGHT VERTEBRAL ARTERY:  Antegrade.  LEFT CAROTID ARTERY: Minimal plaque in the common carotid and bulb. Low resistance internal carotid Doppler pattern some turbulence is noted.  LEFT VERTEBRAL ARTERY:  Antegrade.  IMPRESSION: Less than 50% stenosis in the right and left internal carotid arteries.   Original Report Authenticated By: Jolaine Click, M.D.    Dg Chest Port 1 View  02/28/2012  *RADIOLOGY REPORT*  Clinical Data: Cough.  Recent stroke.  PORTABLE CHEST - 1 VIEW  Comparison: Chest x-ray dated 06/09/2011 and chest CT dated 01/04/2012  Findings: Dual lead pacer in place.  Borderline cardiomegaly, chronic.  There are no acute infiltrates or effusions.  The patient does have peribronchial thickening bilaterally consistent with bronchitis.  IMPRESSION: Bronchitic changes.   Original Report Authenticated By: Francene Boyers, M.D.      [facility] Calculation: Score not calculated

## 2012-03-23 NOTE — Patient Instructions (Addendum)
Your physician recommends that you schedule a follow-up appointment in: 3 months  Stools x 3  CBC/BMET

## 2012-03-23 NOTE — Assessment & Plan Note (Signed)
Excellent control of BP on this visit. She is not dizzy or having presyncope with PT, but currently remains sedentary at [facility] when not at rehab. Will not make any changes at this time.  Continue to follow and see Dr.Rothbart in 3 months.

## 2012-03-23 NOTE — Assessment & Plan Note (Signed)
She has followed up with Dr.Allred in the [facility] clinic within the last 2 weeks for pacemaker interrogation. No medication changes were made at that time. She wishes to follow an EP specialist in the [place] office as this is closer for her to come. Will have her placed on Dr.Taylor's schedule for next EP visit.

## 2012-03-31 ENCOUNTER — Encounter: Payer: Self-pay | Admitting: Adult Health

## 2012-04-01 ENCOUNTER — Telehealth: Payer: Self-pay | Admitting: *Deleted

## 2012-04-01 NOTE — Telephone Encounter (Signed)
Called [name] at [facility] to request copy of labs for pt per notations from recent OV as follows:  Your physician recommends that you schedule a follow-up appointment in: 3 months  Stools x 3  CBC/BMET  Fax received and placed on [facility] desk for further evaluation and next steps to be taken

## 2012-04-06 NOTE — Telephone Encounter (Signed)
Noted receipt of labs scanned into the chart and resulted by the MD, no instructions to contact pt/pcp

## 2012-04-07 ENCOUNTER — Encounter: Payer: Self-pay | Admitting: Cardiology

## 2012-04-07 DIAGNOSIS — Z7901 Long term (current) use of anticoagulants: Secondary | ICD-10-CM | POA: Insufficient documentation

## 2012-04-11 ENCOUNTER — Ambulatory Visit: Payer: Medicare Other | Admitting: Physical Therapy

## 2012-04-21 ENCOUNTER — Encounter (INDEPENDENT_AMBULATORY_CARE_PROVIDER_SITE_OTHER): Payer: Self-pay

## 2012-04-22 ENCOUNTER — Ambulatory Visit (INDEPENDENT_AMBULATORY_CARE_PROVIDER_SITE_OTHER): Payer: Medicare Other | Admitting: General Surgery

## 2012-04-22 ENCOUNTER — Encounter (INDEPENDENT_AMBULATORY_CARE_PROVIDER_SITE_OTHER): Payer: Self-pay | Admitting: General Surgery

## 2012-04-22 VITALS — BP 112/70 | HR 64 | Temp 98.5°F | Resp 18 | Ht 66.0 in | Wt 185.0 lb

## 2012-04-22 DIAGNOSIS — Z09 Encounter for follow-up examination after completed treatment for conditions other than malignant neoplasm: Secondary | ICD-10-CM

## 2012-04-22 NOTE — Progress Notes (Signed)
Subjective:     Patient ID: [name], female   DOB: [birth year], 79 y.o.   MRN: [id number]  HPI Pleasant 79 year old Caucasian female comes in for followup after undergoing a laparoscopic converted to open right hemicolectomy for colon cancer T4N1a in December. Unfortunately on the day of transfer to a skilled nursing facility the patient suffered a stroke. She has recovered quite well. She states that she is doing well. She denies any fever, chills, nausea, vomiting, diarrhea or constipation. She denies any abdominal pain. Her son states that her mental sharpness has significantly improved since immediately after the stroke. She is still taking Coumadin. She is actually going to her primary care physician's office later today for an INR check.  Review of Systems     Objective:   Physical Exam BP 112/70  Pulse 64  Temp 98.5 F (36.9 C) (Temporal)  Resp 18  Ht 5\' 6"  (1.676 m)  Wt 185 lb (83.915 kg)  BMI 29.86 kg/m2  Gen: alert, NAD, non-toxic appearing Pupils: equal, no scleral icterus Pulm: Lungs clear to auscultation, symmetric chest rise CV: regular rate and rhythm Abd: soft, nontender, nondistended. Well-healed midline. No cellulitis. No incisional hernia Ext: no edema, no calf tenderness Skin: no rash, no jaundice     Assessment:     Status post laparoscopic converted to open right hemicolectomy for stage III colon cancer    Plan:     Overall I think she is doing quite well given her age and postoperative issues with her stroke. We discussed her at GI tumor board and she was felt not to be a candidate for chemotherapy. However given her history of ovarian cancer as well as colon cancer the geneticist felt the patient would be a candidate for genetic screening. I discussed this today with the patient and her son. The son would like to discuss this with the other siblings before being referred to the geneticist. Followup with me in 3 months  Kristien Sella. [name], MD, FACS General,  Bariatric, & Minimally Invasive Surgery [facility], [place]

## 2012-04-22 NOTE — Patient Instructions (Signed)
Let me know if y'all would like to see the geneticist

## 2012-05-02 NOTE — Progress Notes (Signed)
Judah Chevere, PTA [phone] 05/02/2012  

## 2012-05-04 ENCOUNTER — Other Ambulatory Visit: Payer: Self-pay | Admitting: Emergency Medicine

## 2012-05-04 MED ORDER — SOTALOL HCL 240 MG PO TABS: 240.0000 mg | ORAL_TABLET | Freq: Two times a day (BID) | ORAL | Status: DC

## 2012-06-03 ENCOUNTER — Ambulatory Visit (INDEPENDENT_AMBULATORY_CARE_PROVIDER_SITE_OTHER): Payer: Medicare Other | Admitting: General Practice

## 2012-06-03 VITALS — BP 125/84 | HR 76 | Temp 96.6°F | Wt 188.0 lb

## 2012-06-03 DIAGNOSIS — Z7901 Long term (current) use of anticoagulants: Secondary | ICD-10-CM

## 2012-06-03 LAB — POCT INR: INR: 2.8

## 2012-06-03 NOTE — Patient Instructions (Signed)
Continue current medications and diet

## 2012-06-06 ENCOUNTER — Other Ambulatory Visit: Payer: Self-pay | Admitting: Pharmacist

## 2012-06-08 ENCOUNTER — Encounter (INDEPENDENT_AMBULATORY_CARE_PROVIDER_SITE_OTHER): Payer: Self-pay | Admitting: General Surgery

## 2012-08-03 ENCOUNTER — Other Ambulatory Visit: Payer: Self-pay | Admitting: Nurse Practitioner

## 2012-09-03 ENCOUNTER — Other Ambulatory Visit: Payer: Self-pay | Admitting: Nurse Practitioner

## 2012-09-05 NOTE — Telephone Encounter (Signed)
NO PROTIME SINCE 3/14. PAST DUE. LAST OV 2/14.

## 2012-09-05 NOTE — Telephone Encounter (Signed)
Hand clinical pharmacist review the warfarin to supplement adverse reaction

## 2012-09-29 ENCOUNTER — Other Ambulatory Visit: Payer: Self-pay | Admitting: Family Medicine

## 2012-09-30 NOTE — Telephone Encounter (Signed)
ntbs

## 2012-09-30 NOTE — Telephone Encounter (Signed)
Last protime was 06/03/12

## 2012-11-01 ENCOUNTER — Other Ambulatory Visit: Payer: Self-pay | Admitting: Family Medicine

## 2012-11-02 NOTE — Telephone Encounter (Signed)
Last seen 05/23/12  mae

## 2012-11-07 ENCOUNTER — Encounter: Payer: Self-pay | Admitting: Family Medicine

## 2012-11-07 ENCOUNTER — Ambulatory Visit (INDEPENDENT_AMBULATORY_CARE_PROVIDER_SITE_OTHER): Payer: Medicare Other | Admitting: Family Medicine

## 2012-11-07 VITALS — BP 149/79 | HR 87 | Temp 97.6°F | Wt 191.8 lb

## 2012-11-07 DIAGNOSIS — I4891 Unspecified atrial fibrillation: Secondary | ICD-10-CM

## 2012-11-07 DIAGNOSIS — S41109A Unspecified open wound of unspecified upper arm, initial encounter: Secondary | ICD-10-CM

## 2012-11-07 DIAGNOSIS — S41112A Laceration without foreign body of left upper arm, initial encounter: Secondary | ICD-10-CM | POA: Insufficient documentation

## 2012-11-07 LAB — POCT INR: INR: 4.3

## 2012-11-07 MED ORDER — CEFDINIR 300 MG PO CAPS: 300.0000 mg | ORAL_CAPSULE | Freq: Two times a day (BID) | ORAL | Status: DC

## 2012-11-07 NOTE — Patient Instructions (Signed)
Keep area clean and dry.  Daily dressing changes if possible. Do not get area wet due to the steristrips.

## 2012-11-07 NOTE — Progress Notes (Signed)
Patient ID: [name], female   DOB: Mar 27, 1922, 79 y.o.   MRN: [id number] SUBJECTIVE: CC: Chief Complaint  Patient presents with  . Abrasion    got scratched by her dog .tetanus up to date. pt on coumadin and last protime was in march     HPI: Her son's dog actually was the animal and it has all its shots up to date. The dog was happy to see her and jumped on her and the skin tore 2 days ago.area involved was the left forearm.  Past Medical History  Diagnosis Date  . Diastolic heart failure   . Ovarian cancer   . Colon cancer   . Arthritis   . History of Clostridium difficile infection     in Jan 2013  . Pacemaker   . History of cellulitis and abscess   . Hypertension     takes Ramipril,Diltiazem daily  . Atrial fibrillation     takes Coumadin daily  . Tachy-brady syndrome 2008    s/p PPM implantation by Dr Gifford Shave takes Betapace daily  . Osteoporosis     takes Prolia every 6months injections  . Shortness of breath     with exertion;Albuterol prn  . Bruises easily     takes COumadin daily  . GERD (gastroesophageal reflux disease)     takes Protonix daily  . Constipation     Milk of Mag nightly  . Anemia     takes Iron daily   Past Surgical History  Procedure Laterality Date  . Abdominal hysterectomy    . Oophorectomy    . Colonoscopy  03/30/2011    Procedure: COLONOSCOPY;  Surgeon: Barrie Folk, MD;  Location: [facility] ENDOSCOPY;  Service: Endoscopy;  Laterality: N/A;  . Esophagogastroduodenoscopy  03/30/2011    Procedure: ESOPHAGOGASTRODUODENOSCOPY (EGD);  Surgeon: Barrie Folk, MD;  Location: [facility] ENDOSCOPY;  Service: Endoscopy;  Laterality: N/A;  . Colon resection  04/03/2011    Procedure: COLON RESECTION LAPAROSCOPIC;  Surgeon: Atilano Ina, MD;  Location: [facility] OR;  Service: General;  Laterality: N/A;  Laparoscopic, turned open sigmoid colon resection, lysis of adhesions x 2.5 hours, rigid proctoscopy.  . Pacemaker placement  2008    MDT EnRhythm implanted by Dr  Amil Amen, generator change (MDT Adapta L) by Dr Johney Frame 08/04/11  . Colonoscopy  12/24/2011    Procedure: COLONOSCOPY;  Surgeon: Malissa Hippo, MD;  Location: [facility] ENDO SUITE;  Service: Endoscopy;  Laterality: N/A;  . Bilateral cataracts    . Battery changed in pacemaker    . Laparoscopic right hemi colectomy  02/19/2012    Procedure: LAPAROSCOPIC RIGHT HEMI COLECTOMY;  Surgeon: Atilano Ina, MD,FACS;  Location: [facility] OR;  Service: General;  Laterality: Right;  attempted laparoscopic assisted right hemi colectomy converted to open.  . Partial colectomy  02/19/2012    Procedure: PARTIAL COLECTOMY;  Surgeon: Atilano Ina, MD,FACS;  Location: [facility] OR;  Service: General;  Laterality: Right;  . Lysis of adhesion  02/19/2012    Procedure: LYSIS OF ADHESION;  Surgeon: Atilano Ina, MD,FACS;  Location: [facility] OR;  Service: General;  Laterality: N/A;   History   Social History  . Marital Status: Widowed    Spouse Name: N/A    Number of Children: N/A  . Years of Education: N/A   Occupational History  . Not on file.   Social History Main Topics  . Smoking status: Never Smoker   . Smokeless tobacco: Never Used  . Alcohol Use: No  .  Drug Use: No  . Sexual Activity: No   Other Topics Concern  . Not on file   Social History Narrative  . No narrative on file   Family History  Problem Relation Age of Onset  . Anesthesia problems Neg Hx   . Hypotension Neg Hx   . Malignant hyperthermia Neg Hx   . Pseudochol deficiency Neg Hx   . Cancer Mother     ovarian   Current Outpatient Prescriptions on File Prior to Visit  Medication Sig Dispense Refill  . calcium carbonate (OS-CAL - DOSED IN MG OF ELEMENTAL CALCIUM) 1250 MG tablet Take 1 tablet by mouth 2 (two) times daily.      Marland Kitchen diltiazem (CARDIZEM CD) 180 MG 24 hr capsule TAKE (1) CAPSULE DAILY  30 capsule  0  . feeding supplement (ENSURE COMPLETE) LIQD Take 237 mLs by mouth 2 (two) times daily between meals.      Marland Kitchen FERREX 150 150 MG capsule TAKE (1)  CAPSULE DAILY  30 capsule  3  . magnesium hydroxide (MILK OF MAGNESIA) 400 MG/5ML suspension Take 30 mLs by mouth at bedtime.       . pantoprazole (PROTONIX) 40 MG tablet TAKE 1 TABLET DAILY  30 tablet  5  . Pollen Extracts (PROSTAT PO) Take 30 mLs by mouth daily.      . sotalol (BETAPACE) 240 MG tablet Take 1 tablet (240 mg total) by mouth 2 (two) times daily.  60 tablet  6  . warfarin (COUMADIN) 5 MG tablet TAKE 1 TABLET ONCE A DAY AT 5 PM  30 tablet  3  . albuterol (PROVENTIL HFA;VENTOLIN HFA) 108 (90 BASE) MCG/ACT inhaler Inhale 2 puffs into the lungs every 6 (six) hours as needed. For shortness of breath/wheezing      . [DISCONTINUED] famotidine (PEPCID) 20 MG tablet Take 1 tablet (20 mg total) by mouth 2 (two) times daily.      . [DISCONTINUED] promethazine (PHENERGAN) 12.5 MG tablet Take 12.5 mg by mouth every 6 (six) hours as needed.       No current facility-administered medications on file prior to visit.   Allergies  Allergen Reactions  . Lasix [Furosemide] Swelling  . Penicillins Nausea And Vomiting and Swelling    Lip swelling and redness  . Sulfa Antibiotics Rash   Immunization History  Administered Date(s) Administered  . Tdap 11/15/2010   Prior to Admission medications   Medication Sig Start Date End Date Taking? Authorizing Provider  calcium carbonate (OS-CAL - DOSED IN MG OF ELEMENTAL CALCIUM) 1250 MG tablet Take 1 tablet by mouth 2 (two) times daily.   Yes Historical Provider, MD  diltiazem (CARDIZEM CD) 180 MG 24 hr capsule TAKE (1) CAPSULE DAILY 11/01/12  Yes Coralie Keens, FNP  feeding supplement (ENSURE COMPLETE) LIQD Take 237 mLs by mouth 2 (two) times daily between meals. 02/25/12  Yes Atilano Ina, MD  FERREX 150 150 MG capsule TAKE (1) CAPSULE DAILY 09/29/12  Yes Raylen-Margaret Daphine Deutscher, FNP  magnesium hydroxide (MILK OF MAGNESIA) 400 MG/5ML suspension Take 30 mLs by mouth at bedtime.    Yes Historical Provider, MD  pantoprazole (PROTONIX) 40 MG tablet TAKE 1  TABLET DAILY 09/29/12  Yes Mayumi-Margaret Daphine Deutscher, FNP  Pollen Extracts (PROSTAT PO) Take 30 mLs by mouth daily.   Yes Historical Provider, MD  sotalol (BETAPACE) 240 MG tablet Take 1 tablet (240 mg total) by mouth 2 (two) times daily. 05/04/12  Yes Hillis Range, MD  warfarin (COUMADIN) 5 MG tablet  TAKE 1 TABLET ONCE A DAY AT 5 PM 09/29/12  Yes Kairie-Margaret Daphine Deutscher, FNP  albuterol (PROVENTIL HFA;VENTOLIN HFA) 108 (90 BASE) MCG/ACT inhaler Inhale 2 puffs into the lungs every 6 (six) hours as needed. For shortness of breath/wheezing 06/10/11 06/09/12  Marinda Elk, MD  cefdinir (OMNICEF) 300 MG capsule Take 1 capsule (300 mg total) by mouth 2 (two) times daily. 11/07/12   Ileana Ladd, MD     ROS: As above in the HPI. All other systems are stable or negative.  OBJECTIVE: APPEARANCE:  Patient in no acute distress.The patient appeared well nourished and normally developed. Acyanotic. Waist: VITAL SIGNS:BP 149/79  Pulse 87  Temp(Src) 97.6 F (36.4 C) (Oral)  Wt 191 lb 12.8 oz (87 kg)  BMI 30.97 kg/m2 Elderly WF  SKIN: warm and  Dry without overt rashes, tattoos and scars There are 3 tears involving the left forearm. 1 small one and 2 larger. All 3 were superficial and Vshaped. The areas were cleaned and steristrips applied after the edges were approximated.  HEAD and Neck: without JVD, Head and scalp: normal Eyes:No scleral icterus. Fundi normal, eye movements normal. Ears: Auricle normal, canal normal, Tympanic membranes normal, insufflation normal. Nose: normal Throat: normal Neck & thyroid: normal  CHEST & LUNGS: Chest wall: normal Lungs: Clear  CVS: Reveals the PMI to be normally located. Regular rhythm, First and Second Heart sounds are normal,  absence of murmurs, rubs or gallops. Peripheral vasculature: Radial pulses: normal Dorsal pedis pulses: normal Posterior pulses: normal  ASSESSMENT: Atrial fibrillation - Plan: POCT INR  Lacerations of multiple sites of  left arm, initial encounter    PLAN: Steristripping:  Subjective:    [name] is a 79 y.o. female who presents for evaluation of 3 lacerations to the left forearm. Injury occurred  2 days ago ago. The mechanism of the wound was due to a dog.  Patient denies any drainage. The tetanus status is up to date.    Objective:    BP 149/79  Pulse 87  Temp(Src) 97.6 F (36.4 C) (Oral)  Wt 191 lb 12.8 oz (87 kg)  BMI 30.97 kg/m2  There are 3 lacerations 1 measuring approximately 2cm in length on the left forearm. Another 2 lacerations measuring 1.5x 1.5 cm in a horizontal V-shaped. Examination of the wound for foreign bodies and devitalized tissue showed no abnormality  Assessment:    3 lacerations   Plan:   Area cleaned with sterile saline. Then tincture of benzoine applied around the wounds. The skin edges were unfolded and straightened out and the edges were approximated. steristrips were applied to keep the edges in approximate position. A dry dressing was applied and a bandage dessing applied and taped to secure.  Family instructed to change dressing daily.  Orders Placed This Encounter  Procedures  . POCT INR    Results for orders placed in visit on 11/07/12  POCT INR      Result Value Range   INR 4.3      Hold coumadin today and tomorrow.  recheck in 2 days.  Meds ordered this encounter  Medications  . cefdinir (OMNICEF) 300 MG capsule    Sig: Take 1 capsule (300 mg total) by mouth 2 (two) times daily.    Dispense:  14 capsule    Refill:  0    Return in about 2 days (around 11/09/2012) for recheck skin tear. and recheck PT.  Nicholas Ossa P. Modesto Charon, M.D.

## 2012-11-09 ENCOUNTER — Ambulatory Visit (INDEPENDENT_AMBULATORY_CARE_PROVIDER_SITE_OTHER): Payer: Medicare Other | Admitting: Family Medicine

## 2012-11-09 ENCOUNTER — Ambulatory Visit: Payer: Medicare Other | Admitting: Pharmacist

## 2012-11-09 ENCOUNTER — Encounter: Payer: Self-pay | Admitting: Family Medicine

## 2012-11-09 VITALS — BP 151/79 | HR 76 | Temp 98.1°F | Wt 181.6 lb

## 2012-11-09 DIAGNOSIS — I4891 Unspecified atrial fibrillation: Secondary | ICD-10-CM

## 2012-11-09 DIAGNOSIS — S41112D Laceration without foreign body of left upper arm, subsequent encounter: Secondary | ICD-10-CM

## 2012-11-09 DIAGNOSIS — Z5189 Encounter for other specified aftercare: Secondary | ICD-10-CM

## 2012-11-09 DIAGNOSIS — S41109A Unspecified open wound of unspecified upper arm, initial encounter: Secondary | ICD-10-CM

## 2012-11-09 LAB — POCT INR: INR: 2.5

## 2012-11-09 NOTE — Progress Notes (Signed)
Subjective:     Patient ID: [name], female   DOB: 1922-07-18, 79 y.o.   MRN: [id number] Chief Complaint  Patient presents with  . Follow-up    reck arm left     HPI Recheck wound and coumadin adjustment. Seen by pharmacist already. Coumadin adjusted.  Wound oozed blood but stopped. No pain no fever  Review of Systems     Objective:   Physical Exam APPEARANCE:  Patient in no acute distress.The patient appeared well nourished and normally developed. Acyanotic. Waist: VITAL SIGNS:BP 151/79  Pulse 76  Temp(Src) 98.1 F (36.7 C) (Oral)  Wt 181 lb 9.6 oz (82.373 kg)  BMI 29.32 kg/m2   SKIN: warm and  Dry without overt rashes, tattoos and scars Wounds : clean and  Dry with steristrips in place.dressings with dried blood. No active bleeding now. Dressings changed.  HEAD and Neck: without JVD, Head and scalp: normal Eyes:No scleral icterus. Fundi normal, eye movements normal. Ears: Auricle normal, canal normal, Tympanic membranes normal, insufflation normal. Nose: normal Throat: normal Neck & thyroid: normal  CHEST & LUNGS: Chest wall: normal Lungs: Clear  CVS: Reveals the PMI to be normally located. Regular rhythm, First and Second Heart sounds are normal,  absence of murmurs, rubs or gallops. Peripheral vasculature: Radial pulses: normal Dorsal pedis pulses: normal Posterior pulses: normal    Assessment:        Lacerations of multiple sites of left arm, subsequent encounter  A-fib    Plan:         Results for orders placed in visit on 11/07/12  POCT INR      Result Value Range   INR 4.3     Coumadin clinic addressed the PT today and in the therapeutic range.  Daily dressing changes.  Gauze and dressing given to care provider.  RTC prn.  Jadie Allington P. Modesto Charon, M.D.

## 2012-11-09 NOTE — Patient Instructions (Signed)
Anticoagulation Dose Instructions as of 11/09/2012     Glynis Smiles Tue Wed Thu Fri Sat   New Dose 5 mg 5 mg 5 mg 5 mg 5 mg 2.5 mg 5 mg    Description       Restart warfarin 5mg  daily except 1/2 tablet on Fridays      INR was 2.5 today

## 2012-11-15 ENCOUNTER — Telehealth: Payer: Self-pay | Admitting: Nurse Practitioner

## 2012-11-15 NOTE — Telephone Encounter (Signed)
I spoke with daughter and she is concerned that her mother had not been in for Protime check since March.  Her mother is 36 and has a care giver on Mon, Wed and Fri mornings.  Daughter wants to know if her mother is a candidate for Xarelto so maybe she doesn't have to come in so often and to help this from happening again.  Or is her a mother for in home machine?

## 2012-11-16 ENCOUNTER — Ambulatory Visit (INDEPENDENT_AMBULATORY_CARE_PROVIDER_SITE_OTHER): Payer: Medicare Other | Admitting: Pharmacist

## 2012-11-16 ENCOUNTER — Telehealth: Payer: Self-pay | Admitting: Pharmacist

## 2012-11-16 ENCOUNTER — Encounter: Payer: Self-pay | Admitting: Pharmacist

## 2012-11-16 DIAGNOSIS — Z7901 Long term (current) use of anticoagulants: Secondary | ICD-10-CM

## 2012-11-16 DIAGNOSIS — I4891 Unspecified atrial fibrillation: Secondary | ICD-10-CM

## 2012-11-16 LAB — POCT CBC
HCT, POC: 35.9 % — AB (ref 37.7–47.9)
Lymph, poc: 1.3 (ref 0.6–3.4)
MCH, POC: 24.1 pg — AB (ref 27–31.2)
MCV: 76.1 fL — AB (ref 80–97)
Platelet Count, POC: 267 10*3/uL (ref 142–424)
RBC: 4.7 M/uL (ref 4.04–5.48)
WBC: 6.3 10*3/uL (ref 4.6–10.2)

## 2012-11-16 NOTE — Telephone Encounter (Signed)
Acutally an INR was performed 11/09/2012.  She is due to come in for recheck today 11/16/2012. Will discuss possibility of change to Xarelto but will probably need to consult with cardio and GI given her history of low HGB and GI bleeding and colon cancer.

## 2012-11-16 NOTE — Patient Instructions (Addendum)
Anticoagulation Dose Instructions as of 11/16/2012     [name] Tue Wed Thu Fri Sat   New Dose 5 mg 5 mg 5 mg 5 mg 5 mg 2.5 mg 5 mg    Description       Continue warfarin 5mg  daily except 2.5mg  (= 1/2 tablet) on Fridays. [facility] notified of dose.       INR was 2.7 today

## 2012-11-16 NOTE — Telephone Encounter (Signed)
Past patient appt time. Had to call patient to remind of appt today.  She is coming in ASAP.

## 2012-11-23 ENCOUNTER — Ambulatory Visit (INDEPENDENT_AMBULATORY_CARE_PROVIDER_SITE_OTHER): Payer: Medicare Other | Admitting: Pharmacist

## 2012-11-23 DIAGNOSIS — Z7901 Long term (current) use of anticoagulants: Secondary | ICD-10-CM

## 2012-11-23 DIAGNOSIS — I4891 Unspecified atrial fibrillation: Secondary | ICD-10-CM

## 2012-11-23 NOTE — Patient Instructions (Signed)
Anticoagulation Dose Instructions as of 11/23/2012     Glynis Smiles Tue Wed Thu Fri Sat   New Dose 5 mg 2.5 mg 5 mg 5 mg 5 mg 2.5 mg 5 mg    Description       No warfarin for 1 day,  then decreased warfarin 5mg  daily except 2.5mg  (= 1/2 tablet) on Mondays and Fridays. [facility] notified.         INR was 3.6 today

## 2012-11-24 ENCOUNTER — Other Ambulatory Visit: Payer: Self-pay | Admitting: General Practice

## 2012-11-28 ENCOUNTER — Encounter: Payer: Self-pay | Admitting: Nurse Practitioner

## 2012-11-30 ENCOUNTER — Ambulatory Visit (INDEPENDENT_AMBULATORY_CARE_PROVIDER_SITE_OTHER): Payer: Medicare Other | Admitting: Nurse Practitioner

## 2012-11-30 ENCOUNTER — Encounter: Payer: Self-pay | Admitting: Nurse Practitioner

## 2012-11-30 VITALS — BP 142/79 | HR 82 | Temp 96.7°F | Ht 66.0 in | Wt 193.0 lb

## 2012-11-30 DIAGNOSIS — R5381 Other malaise: Secondary | ICD-10-CM

## 2012-11-30 DIAGNOSIS — R531 Weakness: Secondary | ICD-10-CM

## 2012-11-30 DIAGNOSIS — I4891 Unspecified atrial fibrillation: Secondary | ICD-10-CM

## 2012-11-30 DIAGNOSIS — I10 Essential (primary) hypertension: Secondary | ICD-10-CM

## 2012-11-30 DIAGNOSIS — Z7901 Long term (current) use of anticoagulants: Secondary | ICD-10-CM

## 2012-11-30 NOTE — Patient Instructions (Signed)

## 2012-11-30 NOTE — Progress Notes (Signed)
Subjective:    Patient ID: [name], female    DOB: Sep 07, 1922, 79 y.o.   MRN: [id number]  HPI Patient in today for follow up- SHe has been in and out of nursing holes over the last 2 years so we have not seen in her lately- SHE is doing quite well and has someone that stays with her at home- Her daughter wants referral for her to get physical therapy at home for her all over extremity weakness. SH eis also on coumadin and family want sher to change to xeralto so don't have to have INR checked so often.  Patient Active Problem List   Diagnosis Date Noted  . A-fib 11/09/2012  . Lacerations of multiple sites of left arm 11/07/2012  . Chronic anticoagulation 04/07/2012  . Stroke 02/26/2012  . Hyperglycemia 02/26/2012  . Cancer of right colon -02/2012 02/25/2012  . Tachycardia-bradycardia 07/30/2011  . Cancer of sigmoid colon s/p sigmoid colectomy for T3N0 lesion on 1/18 04/08/2011  . Atrial flutter with rapid ventricular response 03/27/2011  . Ovarian cancer 03/27/2011  . Anemia 03/27/2011  . Pacemaker-Medtronic 05/31/2010  . Edema 05/31/2010  . Hypertension 05/31/2010  . Atrial fibrillation 05/31/2010  . IBS (irritable bowel syndrome) 05/31/2010  . Osteoporosis 05/31/2010   Outpatient Encounter Prescriptions as of 11/30/2012  Medication Sig Dispense Refill  . calcium carbonate (OS-CAL - DOSED IN MG OF ELEMENTAL CALCIUM) 1250 MG tablet Take 1 tablet by mouth 2 (two) times daily.      . cefdinir (OMNICEF) 300 MG capsule Take 1 capsule (300 mg total) by mouth 2 (two) times daily.  14 capsule  0  . diltiazem (CARDIZEM CD) 180 MG 24 hr capsule TAKE (1) CAPSULE DAILY  30 capsule  4  . feeding supplement (ENSURE COMPLETE) LIQD Take 237 mLs by mouth 2 (two) times daily between meals.      Marland Kitchen FERREX 150 150 MG capsule TAKE (1) CAPSULE DAILY  30 capsule  3  . magnesium hydroxide (MILK OF MAGNESIA) 400 MG/5ML suspension Take 30 mLs by mouth at bedtime.       . pantoprazole (PROTONIX) 40 MG  tablet TAKE 1 TABLET DAILY  30 tablet  5  . Pollen Extracts (PROSTAT PO) Take 30 mLs by mouth daily.      . sotalol (BETAPACE) 240 MG tablet Take 1 tablet (240 mg total) by mouth 2 (two) times daily.  60 tablet  6  . warfarin (COUMADIN) 5 MG tablet TAKE 1 TABLET ONCE A DAY AT 5 PM  30 tablet  3  . albuterol (PROVENTIL HFA;VENTOLIN HFA) 108 (90 BASE) MCG/ACT inhaler Inhale 2 puffs into the lungs every 6 (six) hours as needed. For shortness of breath/wheezing       No facility-administered encounter medications on file as of 11/30/2012.       Review of Systems  Constitutional: Negative for activity change, appetite change and fatigue.  HENT: Negative.   Respiratory: Negative for cough, choking and shortness of breath.   Cardiovascular: Negative for chest pain, palpitations and leg swelling.  Genitourinary: Negative.   Musculoskeletal: Negative.   Neurological: Positive for weakness. Negative for light-headedness, numbness and headaches.  Psychiatric/Behavioral: Negative.        Objective:   Physical Exam  Constitutional: She is oriented to person, place, and time. She appears well-developed and well-nourished.  HENT:  Nose: Nose normal.  Mouth/Throat: Oropharynx is clear and moist.  Eyes: EOM are normal.  Neck: Trachea normal, normal range of motion and full  passive range of motion without pain. Neck supple. No JVD present. Carotid bruit is not present. No thyromegaly present.  Cardiovascular: Normal rate, regular rhythm, normal heart sounds and intact distal pulses.  Exam reveals no gallop and no friction rub.   No murmur heard. Pulmonary/Chest: Effort normal and breath sounds normal.  Abdominal: Soft. Bowel sounds are normal. She exhibits no distension and no mass. There is no tenderness.  Musculoskeletal: Normal range of motion.  ambulating with a cane-   Lymphadenopathy:    She has no cervical adenopathy.  Neurological: She is alert and oriented to person, place, and time.  She has normal reflexes.  Skin: Skin is warm and dry.  Psychiatric: She has a normal mood and affect. Her behavior is normal. Judgment and thought content normal.   BP 142/79  Pulse 82  Temp(Src) 96.7 F (35.9 C) (Oral)  Ht 5\' 6"  (1.676 m)  Wt 193 lb (87.544 kg)  BMI 31.17 kg/m2        Assessment & Plan:   1. Atrial fibrillation   2. Hypertension   3. Weakness generalized   4. Chronic anticoagulation    Orders Placed This Encounter  Procedures  . CMP14+EGFR  . NMR, lipoprofile     Medication List       This list is accurate as of: 11/30/12 10:50 AM.  Always use your most recent med list.               albuterol 108 (90 BASE) MCG/ACT inhaler  Commonly known as:  PROVENTIL HFA;VENTOLIN HFA  Inhale 2 puffs into the lungs every 6 (six) hours as needed. For shortness of breath/wheezing     calcium carbonate 1250 MG tablet  Commonly known as:  OS-CAL - dosed in mg of elemental calcium  Take 1 tablet by mouth 2 (two) times daily.     cefdinir 300 MG capsule  Commonly known as:  OMNICEF  Take 1 capsule (300 mg total) by mouth 2 (two) times daily.     diltiazem 180 MG 24 hr capsule  Commonly known as:  CARDIZEM CD  TAKE (1) CAPSULE DAILY     feeding supplement Liqd  Take 237 mLs by mouth 2 (two) times daily between meals.     FERREX 150 150 MG capsule  Generic drug:  iron polysaccharides  TAKE (1) CAPSULE DAILY     magnesium hydroxide 400 MG/5ML suspension  Commonly known as:  MILK OF MAGNESIA  Take 30 mLs by mouth at bedtime.     pantoprazole 40 MG tablet  Commonly known as:  PROTONIX  TAKE 1 TABLET DAILY     PROSTAT PO  Take 30 mLs by mouth daily.     sotalol 240 MG tablet  Commonly known as:  BETAPACE  Take 1 tablet (240 mg total) by mouth 2 (two) times daily.     warfarin 5 MG tablet  Commonly known as:  COUMADIN  TAKE 1 TABLET ONCE A DAY AT 5 PM

## 2012-12-01 LAB — CMP14+EGFR
Albumin/Globulin Ratio: 1.6 (ref 1.1–2.5)
Albumin: 4 g/dL (ref 3.5–4.7)
Alkaline Phosphatase: 64 IU/L (ref 39–117)
BUN/Creatinine Ratio: 22 (ref 11–26)
BUN: 20 mg/dL (ref 8–27)
Chloride: 101 mmol/L (ref 97–108)
GFR calc Af Amer: 67 mL/min/{1.73_m2} (ref >59)
GFR calc non Af Amer: 58 mL/min/{1.73_m2} — ABNORMAL LOW (ref >59)
Globulin, Total: 2.5 g/dL (ref 1.5–4.5)
Glucose: 84 mg/dL (ref 65–99)
Total Bilirubin: 0.3 mg/dL (ref 0.0–1.2)
Total Protein: 6.5 g/dL (ref 6.0–8.5)

## 2012-12-01 LAB — NMR, LIPOPROFILE
Cholesterol: 197 mg/dL (ref <200)
HDL Particle Number: 39.7 umol/L (ref >=30.5)
LDL Particle Number: 1565 nmol/L — ABNORMAL HIGH (ref <1000)
LDL Size: 21.5 nm (ref >20.5)
LP-IR Score: 37 (ref <=45)
Small LDL Particle Number: 528 nmol/L — ABNORMAL HIGH (ref <=527)
Triglycerides by NMR: 139 mg/dL (ref <150)

## 2012-12-05 ENCOUNTER — Telehealth: Payer: Self-pay | Admitting: Pharmacist

## 2012-12-05 ENCOUNTER — Other Ambulatory Visit: Payer: Self-pay | Admitting: Nurse Practitioner

## 2012-12-05 DIAGNOSIS — R29898 Other symptoms and signs involving the musculoskeletal system: Secondary | ICD-10-CM

## 2012-12-05 NOTE — Telephone Encounter (Signed)
ok 

## 2012-12-05 NOTE — Telephone Encounter (Signed)
Planning to switch to newer anticoagulant.  Suggest Eliquis 5mg  1 tablet twice a day.  Confirmed with [facility] copay is zero.  Patient to hold warfarin tomorrow and Wednesday morning.  Will check INR - if less than 2.0 then will start Eliquis 5mg  bid on Wednesday.

## 2012-12-07 ENCOUNTER — Ambulatory Visit (INDEPENDENT_AMBULATORY_CARE_PROVIDER_SITE_OTHER): Payer: Medicare Other | Admitting: Pharmacist

## 2012-12-07 DIAGNOSIS — I4891 Unspecified atrial fibrillation: Secondary | ICD-10-CM

## 2012-12-07 DIAGNOSIS — Z7901 Long term (current) use of anticoagulants: Secondary | ICD-10-CM

## 2012-12-07 MED ORDER — APIXABAN 5 MG PO TABS: 5.0000 mg | ORAL_TABLET | Freq: Two times a day (BID) | ORAL | Status: DC

## 2012-12-07 NOTE — Progress Notes (Signed)
Since INR less than 2.0 today, will discontinue warfarin and start Eliquis 5mg  BID.   Rx sent to [facility] and they will change patient's medication boxes to reflect change in therapy.  Patient will RTC in 3 weeks to recheck CBC since she has a history of low HGB / anemia / colon cancer.

## 2012-12-08 ENCOUNTER — Other Ambulatory Visit: Payer: Self-pay | Admitting: Internal Medicine

## 2012-12-09 ENCOUNTER — Other Ambulatory Visit: Payer: Self-pay | Admitting: *Deleted

## 2012-12-09 MED ORDER — SOTALOL HCL 240 MG PO TABS: 240.0000 mg | ORAL_TABLET | Freq: Two times a day (BID) | ORAL | Status: DC

## 2012-12-26 ENCOUNTER — Ambulatory Visit (INDEPENDENT_AMBULATORY_CARE_PROVIDER_SITE_OTHER): Payer: Medicare Other | Admitting: Pharmacist

## 2012-12-26 DIAGNOSIS — Z862 Personal history of diseases of the blood and blood-forming organs and certain disorders involving the immune mechanism: Secondary | ICD-10-CM

## 2012-12-26 DIAGNOSIS — Z79899 Other long term (current) drug therapy: Secondary | ICD-10-CM

## 2012-12-26 DIAGNOSIS — Z23 Encounter for immunization: Secondary | ICD-10-CM

## 2012-12-26 LAB — POCT CBC
Granulocyte percent: 81.3 %G — AB (ref 37–80)
Hemoglobin: 11.3 g/dL — AB (ref 12.2–16.2)
Lymph, poc: 1 (ref 0.6–3.4)
MCH, POC: 24.1 pg — AB (ref 27–31.2)
MCV: 75.3 fL — AB (ref 80–97)
Platelet Count, POC: 245 10*3/uL (ref 142–424)
RBC: 4.7 M/uL (ref 4.04–5.48)

## 2012-12-26 NOTE — Progress Notes (Signed)
Patient is here today to follow up after starting Eliquis 1 month ago.  She has a history of colon cancer and GI bleed and atrial fibrillation.  Denies s/s of bleeding.  Tolerating Eliquis well  CBC was normal today  Assessement: Chronic anticoagulation secondary to afib.   Plan: Continue Eliquis BID Check CBC q 2-3 months for first year.  Due recheck with PCP in December.   Henrene Pastor, PharmD, CPP

## 2013-01-08 ENCOUNTER — Encounter: Payer: Self-pay | Admitting: Family Medicine

## 2013-01-12 ENCOUNTER — Encounter: Payer: Self-pay | Admitting: *Deleted

## 2013-01-12 NOTE — Telephone Encounter (Signed)
This encounter was created in error - please disregard.

## 2013-01-14 IMAGING — CT CT HEAD W/O CM
1 of 2 series · 16 of 30 positions shown, 20 images · non-contrast
Comparison: 02/25/2012

CLINICAL DATA: Evaluate for stroke.  Altered mental status.  The
patient's speech appears affected. Pacemaker.

CT HEAD WITHOUT CONTRAST
TECHNIQUE: Contiguous axial images were obtained from the base of
the skull through the vertex without contrast.

[Series 2: headtrauma 4.8 h37s · axial · 0.49mm/px · z∈[+77,+207]mm · 16 of 30 slices shown, 20 images]
[im 2/30  brain]
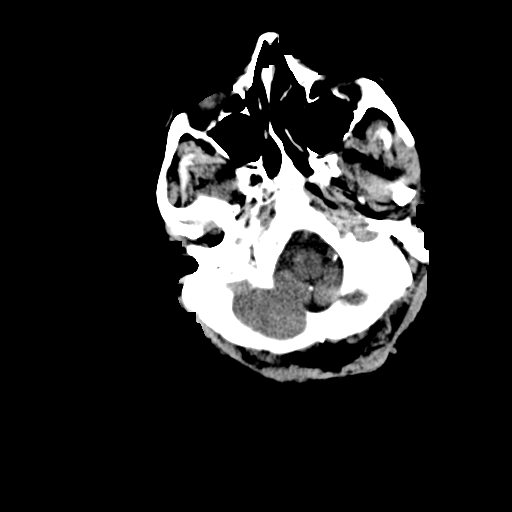
[im 2/30  bone]
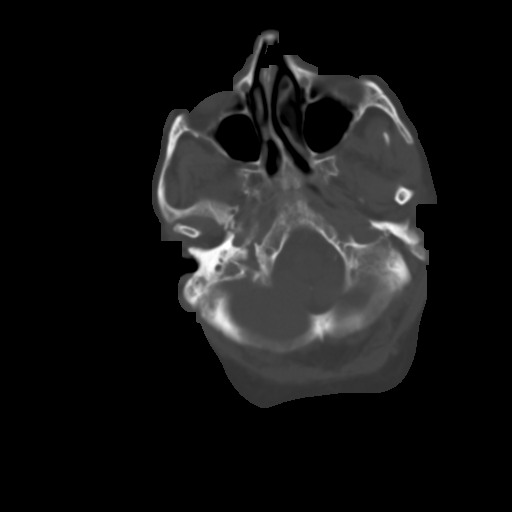
[im 3/30  brain]
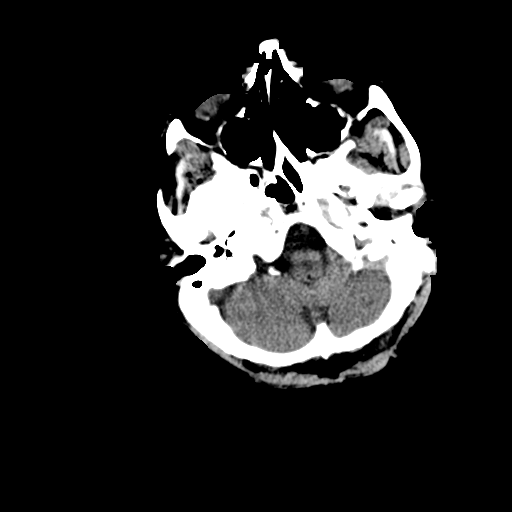
[im 5/30  brain]
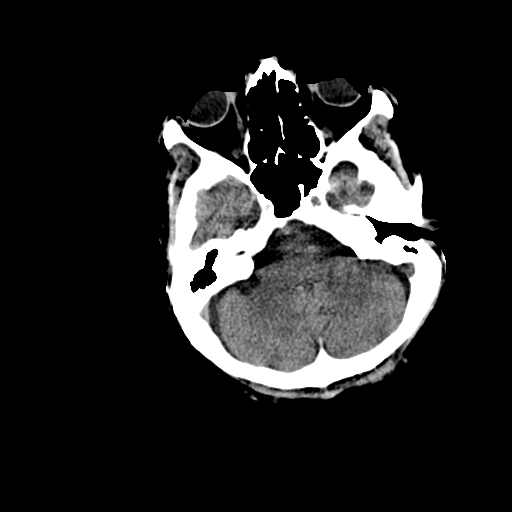
[im 8/30  brain]
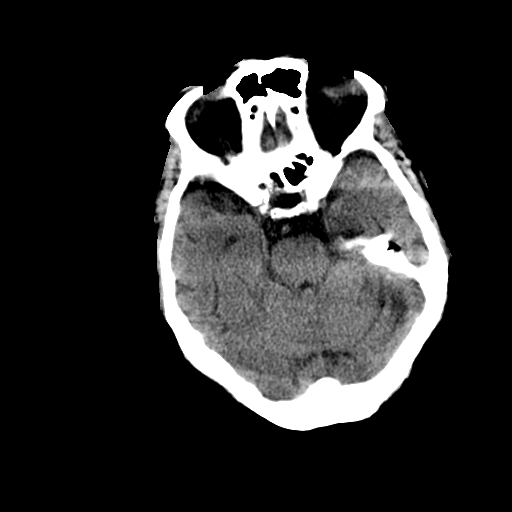
[im 9/30  brain]
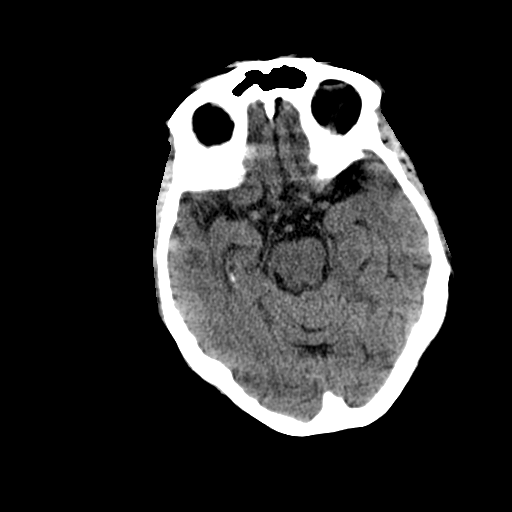
[im 9/30  bone]
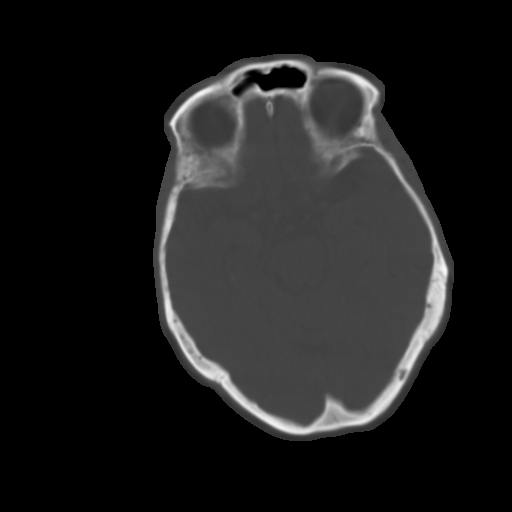
[im 11/30  brain]
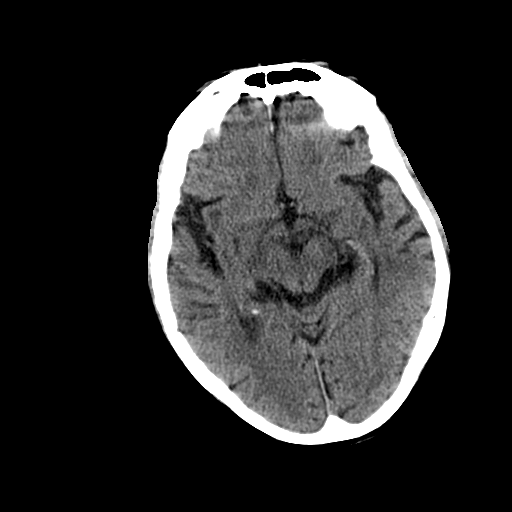
[im 12/30  brain]
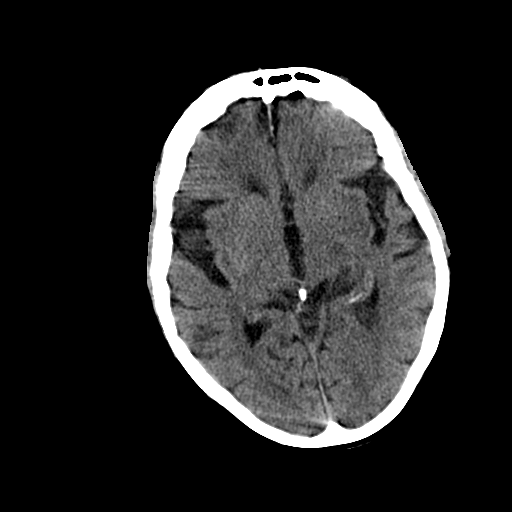
[im 14/30  brain]
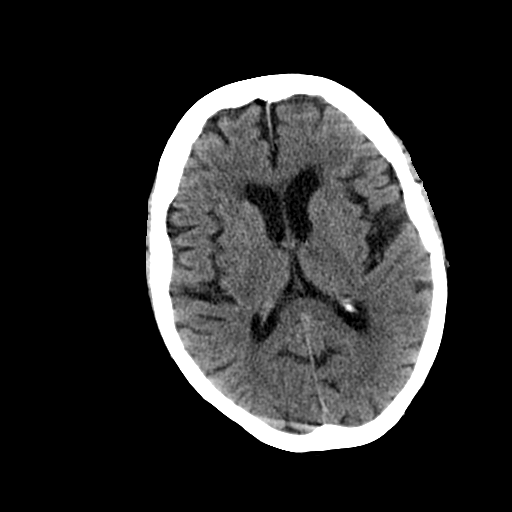
[im 16/30  brain]
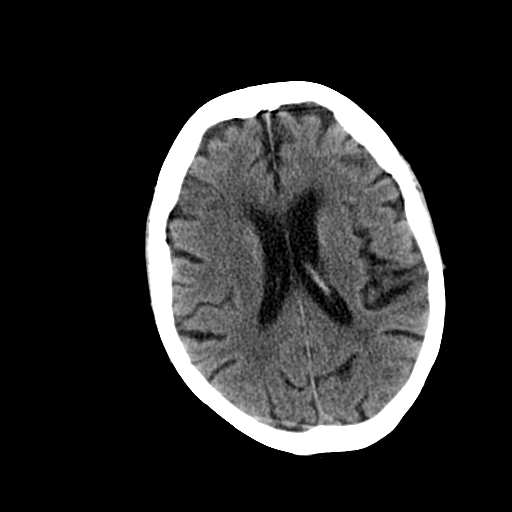
[im 16/30  bone]
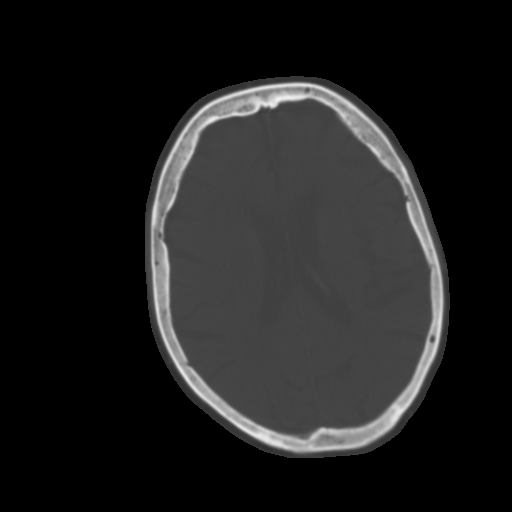
[im 18/30  brain]
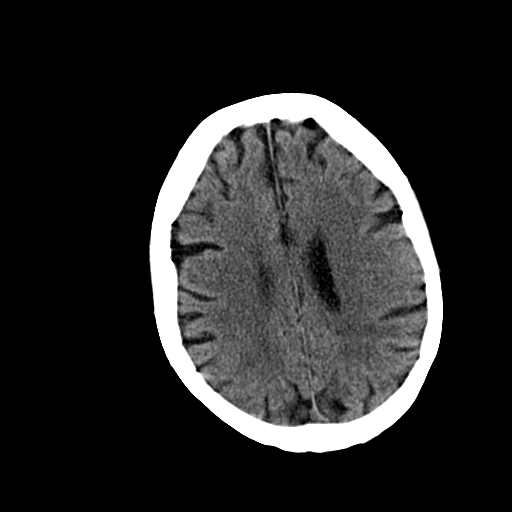
[im 19/30  brain]
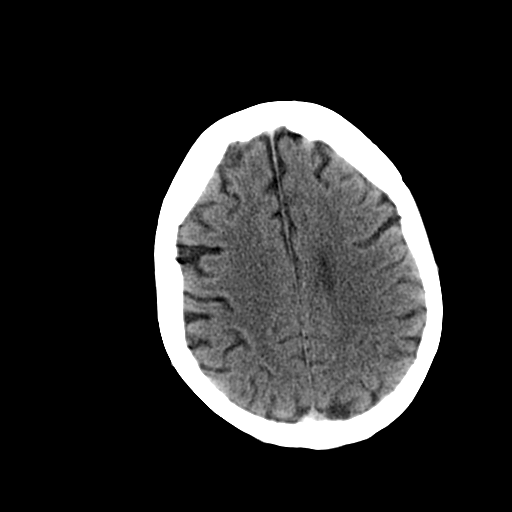
[im 21/30  brain]
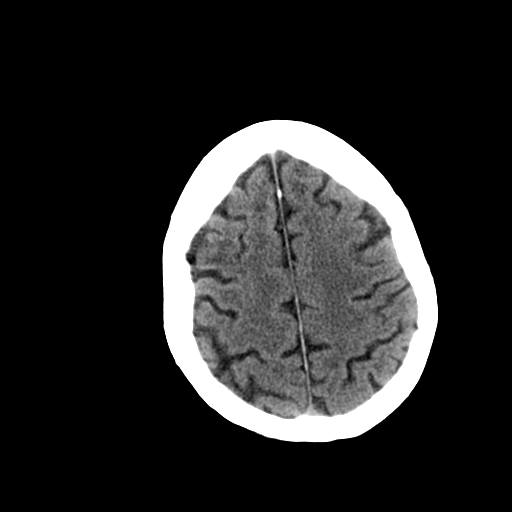
[im 22/30  brain]
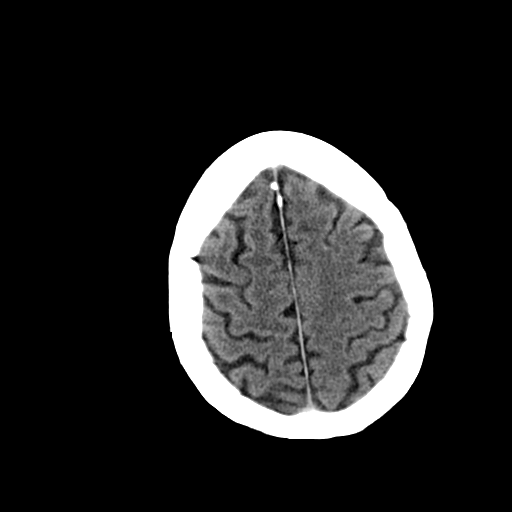
[im 22/30  bone]
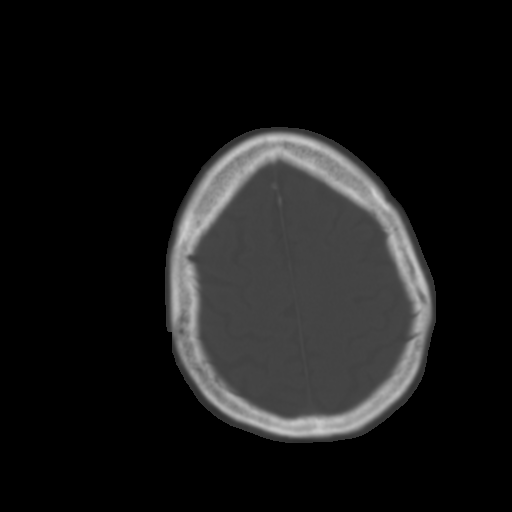
[im 25/30  brain]
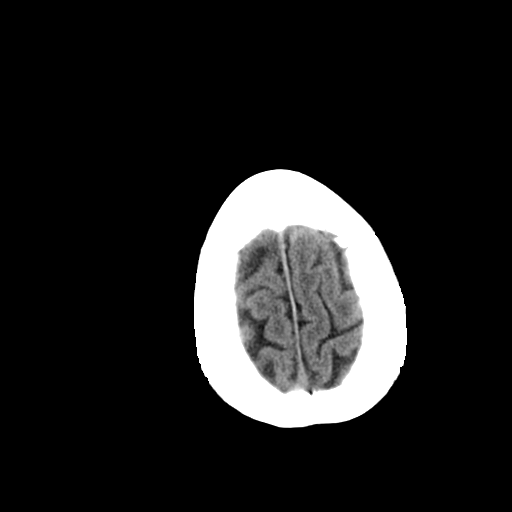
[im 27/30  brain]
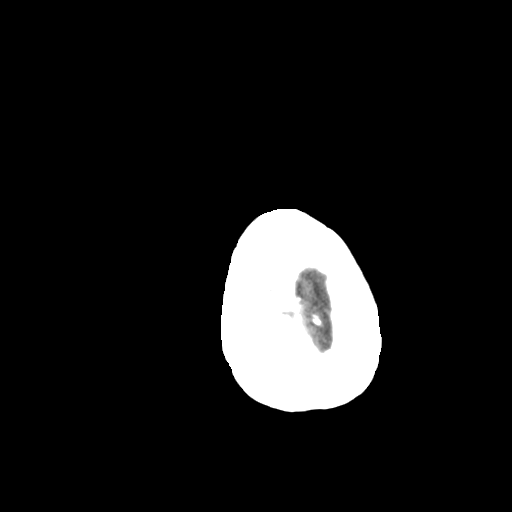
[im 28/30  brain]
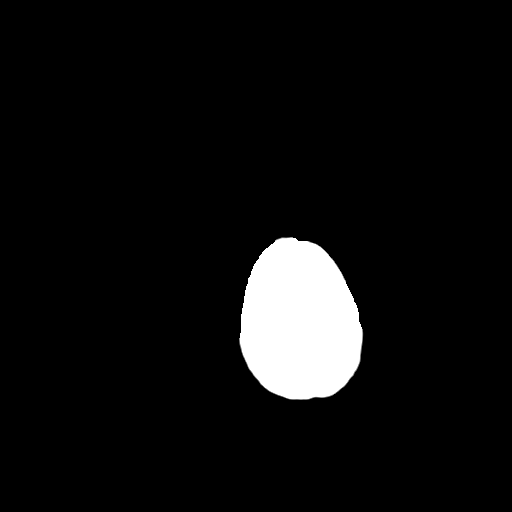

[16 of 30 positions shown; findings below may reference images not displayed]

FINDINGS: There is no evidence for acute infarction, intracranial
hemorrhage, mass lesion, hydrocephalus, or extra-axial fluid.
Moderate atrophy is present with chronic microvascular ischemic
change.  The calvarium remains intact.  Sinuses and mastoids are
clear.  Compared with the previous study I do not see evidence for
interval development of cortical hypodensity in the day.  Moderate
vascular calcification is again noted in the carotid siphon
regions.
IMPRESSION: Moderate atrophy and chronic microvascular ischemic change.  No
visible acute stroke or bleed.

## 2013-01-15 IMAGING — CR DG CHEST 1V PORT
1 series · 1 of 1 positions shown · non-contrast
Comparison: Chest x-ray dated 06/09/2011 and chest CT dated
01/04/2012

CLINICAL DATA: Cough.  Recent stroke.

PORTABLE CHEST - 1 VIEW

[view not recorded]
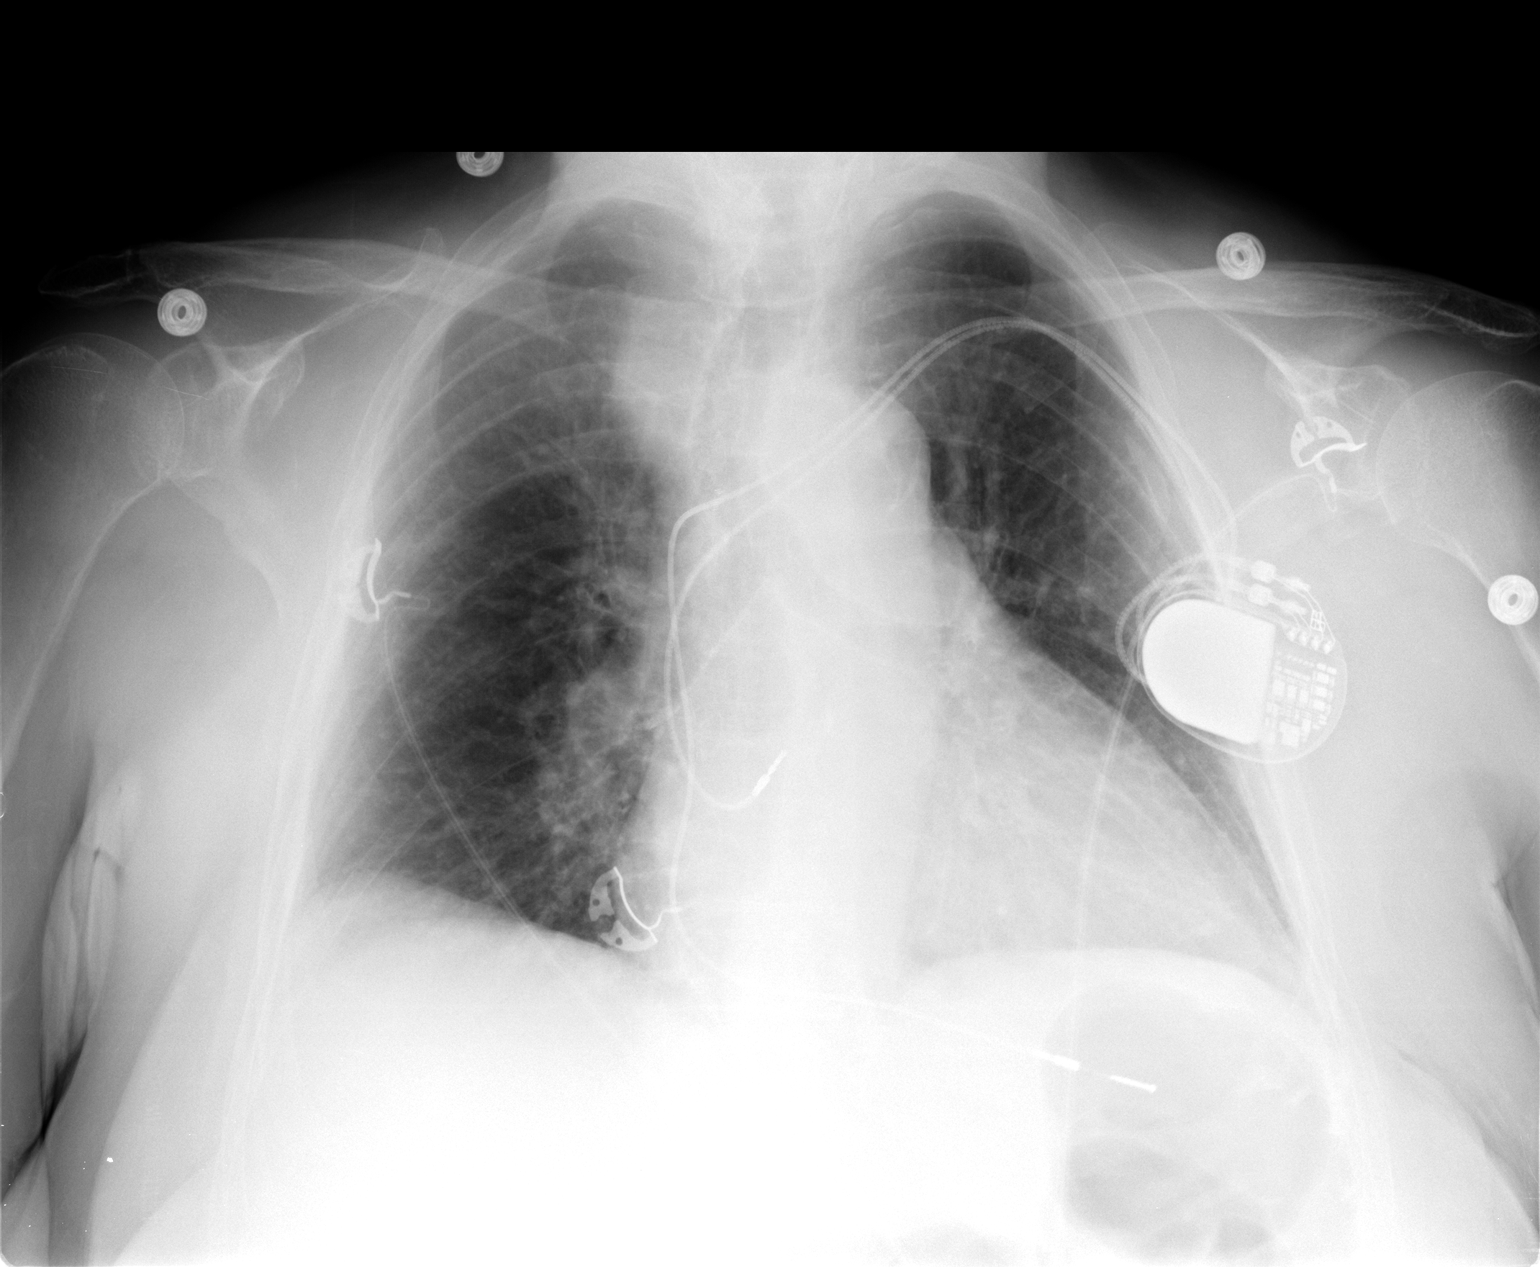

[1 of 1 positions shown; findings below may reference images not displayed]

FINDINGS: Dual lead pacer in place.  Borderline cardiomegaly,
chronic.  There are no acute infiltrates or effusions.  The patient
does have peribronchial thickening bilaterally consistent with
bronchitis.
IMPRESSION: Bronchitic changes.

## 2013-01-16 ENCOUNTER — Other Ambulatory Visit: Payer: Self-pay | Admitting: Nurse Practitioner

## 2013-01-16 IMAGING — US US CAROTID DUPLEX BILAT
1 series · 13 of 24 positions shown · non-contrast
Comparison: None.

CLINICAL DATA: Acute aphasia

BILATERAL CAROTID DUPLEX ULTRASOUND
TECHNIQUE: Gray scale imaging, color Doppler and duplex ultrasound
was performed of bilateral carotid and vertebral arteries in the
neck.

[Series 1: us carotid duplex bilat · 0.11mm/px · 13 of 69 slices shown]
[im 1/69]
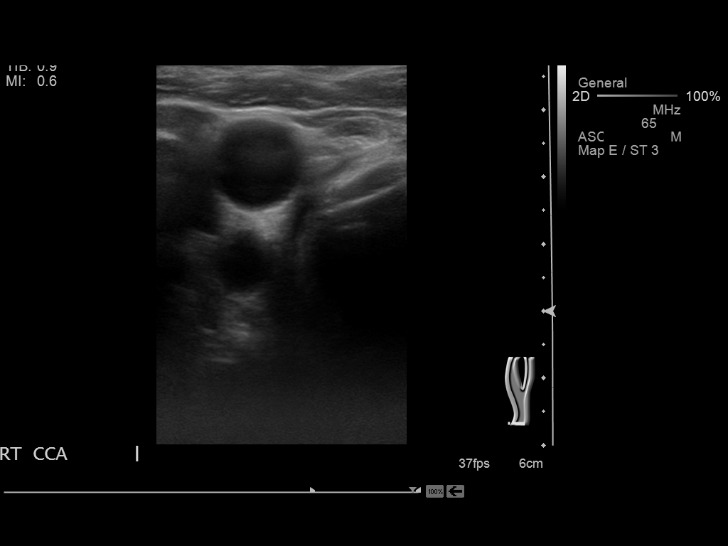
[im 6/69]
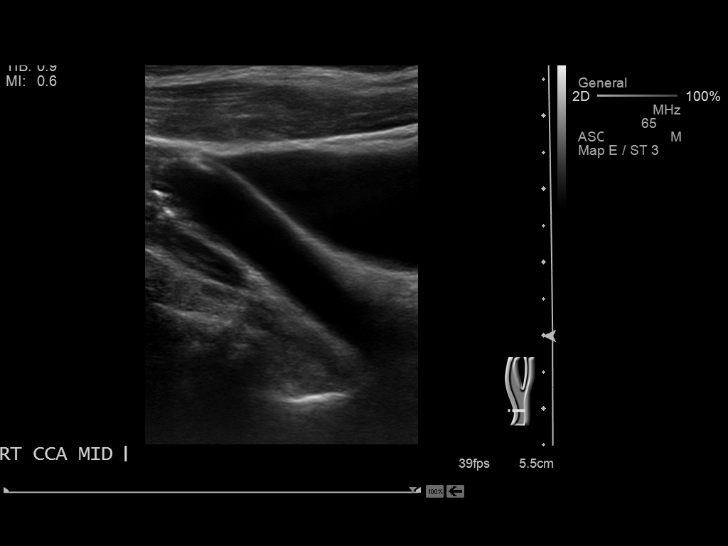
[im 12/69]
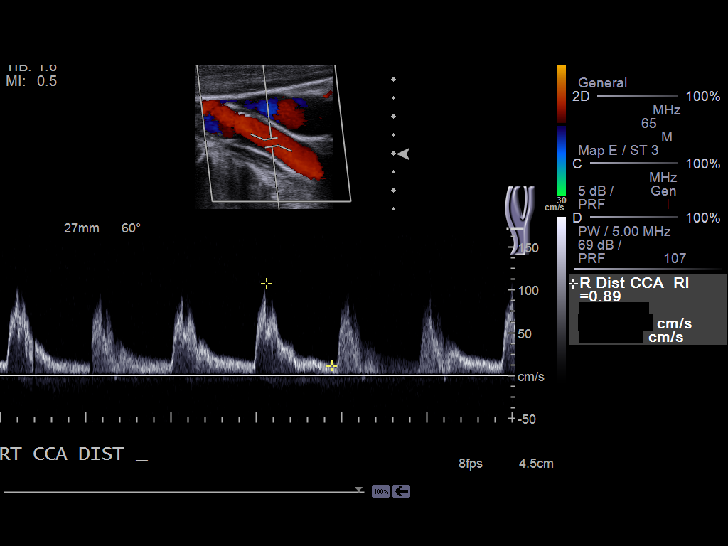
[im 18/69]
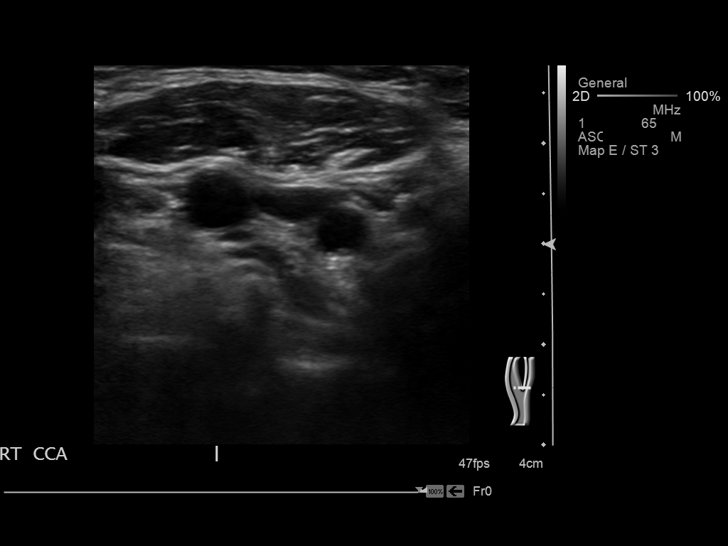
[im 24/69]
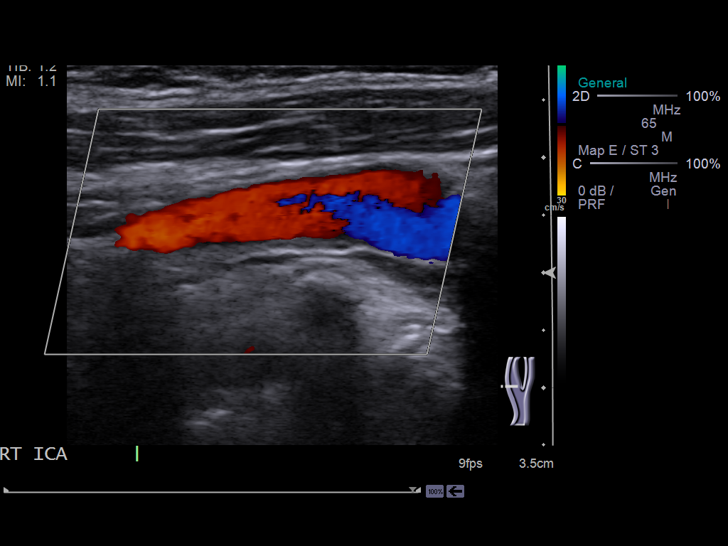
[im 30/69]
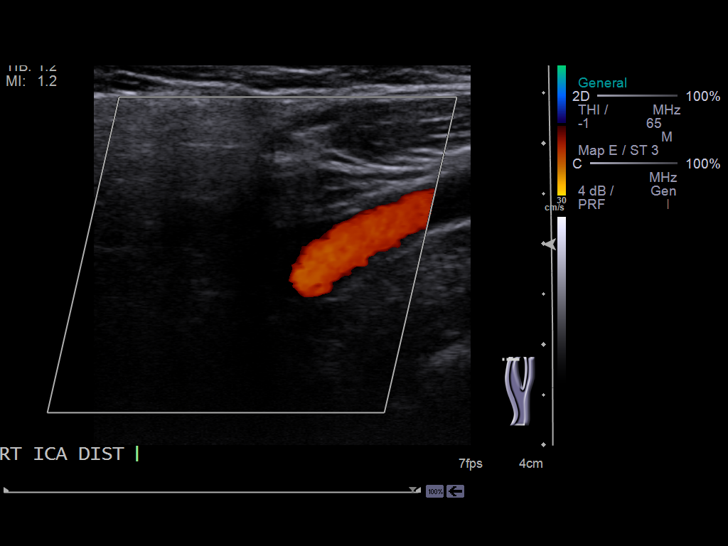
[im 36/69]
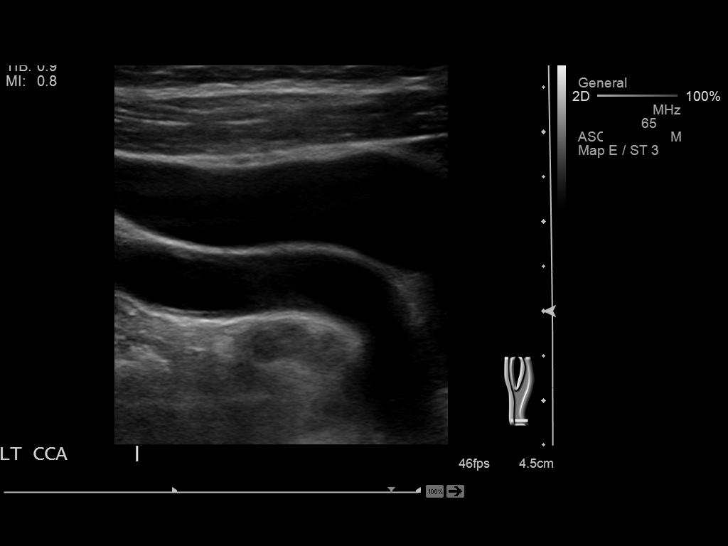
[im 39/69]
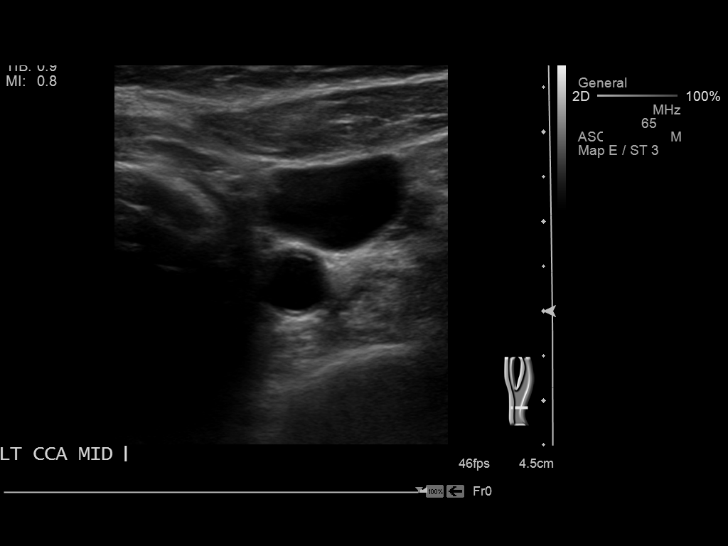
[im 45/69]
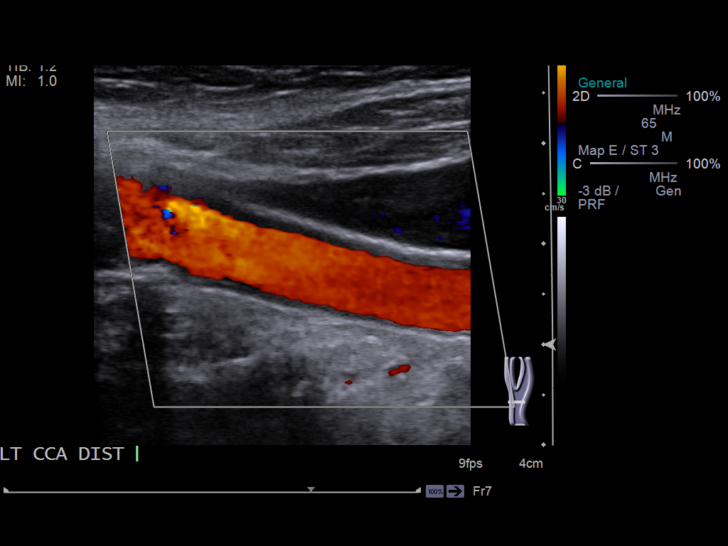
[im 51/69]
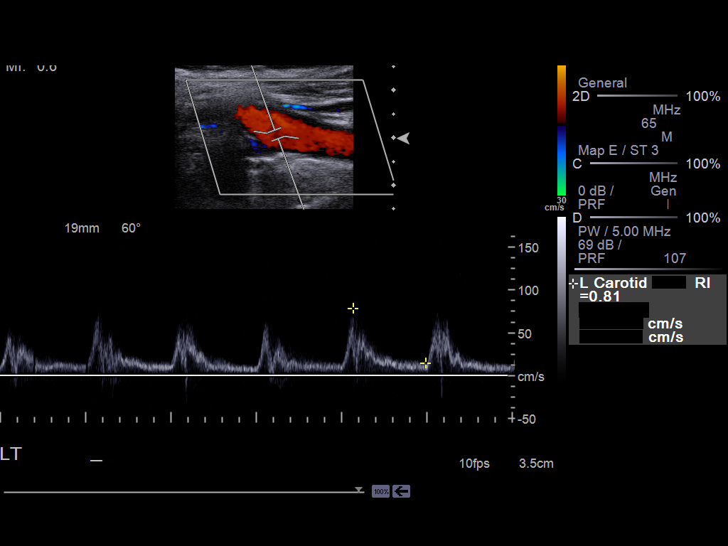
[im 57/69]
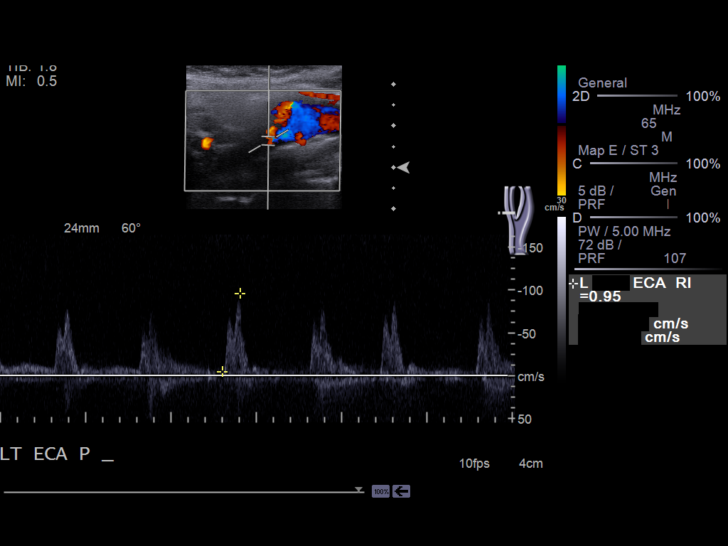
[im 63/69]
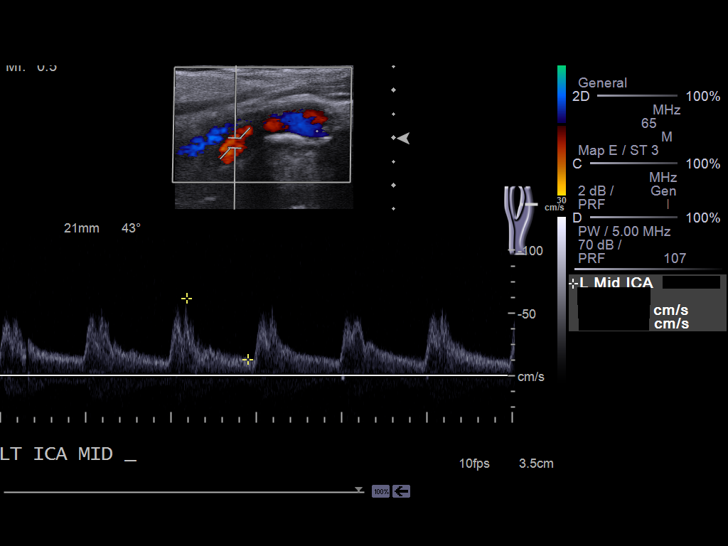
[im 69/69]
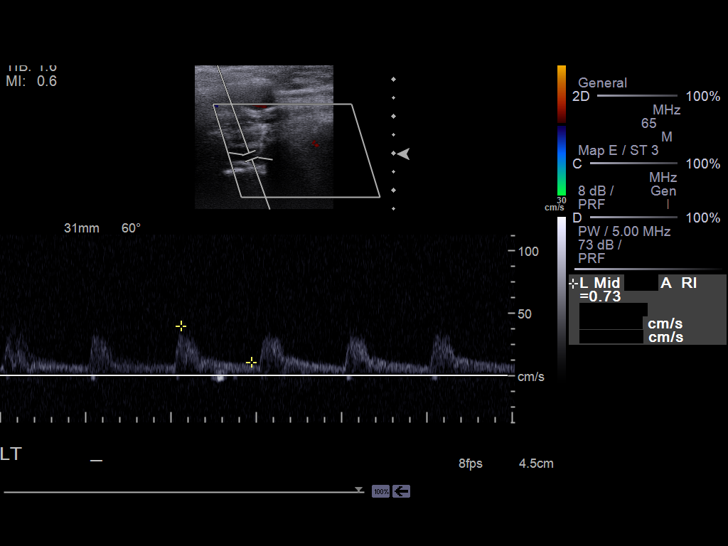

[13 of 24 positions shown; findings below may reference images not displayed]

Criteria:  Quantification of carotid stenosis is based on velocity
parameters that correlate the residual internal carotid diameter
with NASCET-based stenosis levels, using the diameter of the distal
internal carotid lumen as the denominator for stenosis measurement.

The following velocity measurements were obtained:

                 PEAK SYSTOLIC/END DIASTOLIC
RIGHT
ICA:                        99cm/sec
CCA:                        87cm/sec
SYSTOLIC ICA/CCA RATIO:
DIASTOLIC ICA/CCA RATIO:
ECA:                        105cm/sec

LEFT
ICA:                        99cm/sec
CCA:                        97cm/sec
SYSTOLIC ICA/CCA RATIO:
DIASTOLIC ICA/CCA RATIO:
ECA:                        96cm/sec
FINDINGS: RIGHT CAROTID ARTERY: Mild calcified plaque in the bulb.  Low
resistance internal carotid Doppler pattern.  Some turbulence is
noted.

RIGHT VERTEBRAL ARTERY:  Antegrade.

LEFT CAROTID ARTERY: Minimal plaque in the common carotid and bulb.
Low resistance internal carotid Doppler pattern some turbulence is
noted.

LEFT VERTEBRAL ARTERY:  Antegrade.
IMPRESSION: Less than 50% stenosis in the right and left internal carotid
arteries.

## 2013-02-10 ENCOUNTER — Other Ambulatory Visit: Payer: Self-pay | Admitting: Internal Medicine

## 2013-02-12 DIAGNOSIS — I4891 Unspecified atrial fibrillation: Secondary | ICD-10-CM

## 2013-02-12 DIAGNOSIS — IMO0001 Reserved for inherently not codable concepts without codable children: Secondary | ICD-10-CM

## 2013-02-12 DIAGNOSIS — I10 Essential (primary) hypertension: Secondary | ICD-10-CM

## 2013-02-12 DIAGNOSIS — M810 Age-related osteoporosis without current pathological fracture: Secondary | ICD-10-CM

## 2013-02-13 ENCOUNTER — Other Ambulatory Visit: Payer: Self-pay | Admitting: *Deleted

## 2013-02-13 MED ORDER — SOTALOL HCL 240 MG PO TABS: 240.0000 mg | ORAL_TABLET | Freq: Two times a day (BID) | ORAL | Status: DC

## 2013-03-01 ENCOUNTER — Encounter: Payer: Self-pay | Admitting: Nurse Practitioner

## 2013-03-01 ENCOUNTER — Ambulatory Visit (INDEPENDENT_AMBULATORY_CARE_PROVIDER_SITE_OTHER): Payer: Medicare Other | Admitting: Nurse Practitioner

## 2013-03-01 VITALS — BP 135/77 | HR 72 | Temp 97.6°F | Ht 66.0 in | Wt 196.0 lb

## 2013-03-01 DIAGNOSIS — R739 Hyperglycemia, unspecified: Secondary | ICD-10-CM

## 2013-03-01 DIAGNOSIS — R7309 Other abnormal glucose: Secondary | ICD-10-CM

## 2013-03-01 DIAGNOSIS — K589 Irritable bowel syndrome without diarrhea: Secondary | ICD-10-CM

## 2013-03-01 DIAGNOSIS — I4891 Unspecified atrial fibrillation: Secondary | ICD-10-CM

## 2013-03-01 DIAGNOSIS — Z950 Presence of cardiac pacemaker: Secondary | ICD-10-CM

## 2013-03-01 DIAGNOSIS — I10 Essential (primary) hypertension: Secondary | ICD-10-CM

## 2013-03-01 MED ORDER — DILTIAZEM HCL ER COATED BEADS 180 MG PO CP24: 180.0000 mg | ORAL_CAPSULE | Freq: Every day | ORAL | Status: DC

## 2013-03-01 MED ORDER — PANTOPRAZOLE SODIUM 40 MG PO TBEC: 40.0000 mg | DELAYED_RELEASE_TABLET | Freq: Every day | ORAL | Status: DC

## 2013-03-01 MED ORDER — SOTALOL HCL 240 MG PO TABS: 240.0000 mg | ORAL_TABLET | Freq: Two times a day (BID) | ORAL | Status: DC

## 2013-03-01 MED ORDER — APIXABAN 5 MG PO TABS: 5.0000 mg | ORAL_TABLET | Freq: Two times a day (BID) | ORAL | Status: DC

## 2013-03-01 NOTE — Patient Instructions (Signed)

## 2013-03-01 NOTE — Progress Notes (Signed)
Subjective:    Patient ID: [name], female    DOB: June 14, 1922, 79 y.o.   MRN: [id number]  HPI Patient here today for follow up of chronic medical problems- SHe is doing well- Patient has been in the hospital off and on over the last several years but hasn't been back since January of 2014. She says she feels good- Getting around and doing as much as she can. Patient Active Problem List   Diagnosis Date Noted  . Lacerations of multiple sites of left arm 11/07/2012  . Chronic anticoagulation 04/07/2012  . Stroke 02/26/2012  . Hyperglycemia 02/26/2012  . Cancer of right colon -02/2012 02/25/2012  . Tachycardia-bradycardia 07/30/2011  . Cancer of sigmoid colon s/p sigmoid colectomy for T3N0 lesion on 1/18 04/08/2011  . Atrial flutter with rapid ventricular response 03/27/2011  . Ovarian cancer 03/27/2011  . Anemia 03/27/2011  . Pacemaker-Medtronic 05/31/2010  . Edema 05/31/2010  . Hypertension 05/31/2010  . Atrial fibrillation 05/31/2010  . IBS (irritable bowel syndrome) 05/31/2010  . Osteoporosis 05/31/2010   Outpatient Encounter Prescriptions as of 03/01/2013  Medication Sig  . albuterol (PROVENTIL HFA;VENTOLIN HFA) 108 (90 BASE) MCG/ACT inhaler Inhale 2 puffs into the lungs every 6 (six) hours as needed. For shortness of breath/wheezing  . diltiazem (CARDIZEM CD) 180 MG 24 hr capsule TAKE (1) CAPSULE DAILY  . ELIQUIS 5 MG TABS tablet TAKE  (1)  TABLET TWICE A DAY.  Marland Kitchen FERREX 150 150 MG capsule TAKE (1) CAPSULE DAILY  . magnesium hydroxide (MILK OF MAGNESIA) 400 MG/5ML suspension Take 30 mLs by mouth at bedtime.   . pantoprazole (PROTONIX) 40 MG tablet TAKE 1 TABLET DAILY  . sotalol (BETAPACE) 240 MG tablet Take 1 tablet (240 mg total) by mouth 2 (two) times daily.  . [DISCONTINUED] calcium carbonate (OS-CAL - DOSED IN MG OF ELEMENTAL CALCIUM) 1250 MG tablet Take 1 tablet by mouth 2 (two) times daily.  . [DISCONTINUED] cefdinir (OMNICEF) 300 MG capsule Take 1 capsule (300 mg  total) by mouth 2 (two) times daily.  . [DISCONTINUED] feeding supplement (ENSURE COMPLETE) LIQD Take 237 mLs by mouth 2 (two) times daily between meals.  . [DISCONTINUED] Pollen Extracts (PROSTAT PO) Take 30 mLs by mouth daily.       Review of Systems  Constitutional: Negative.   HENT: Negative.   Eyes: Negative.   Respiratory: Negative.   Cardiovascular: Negative.   Gastrointestinal: Negative.   Genitourinary: Negative.   Neurological: Positive for dizziness (occasional).  Hematological: Negative.   Psychiatric/Behavioral: Negative.   All other systems reviewed and are negative.       Objective:   Physical Exam  Constitutional: She is oriented to person, place, and time. She appears well-developed and well-nourished.  HENT:  Nose: Nose normal.  Mouth/Throat: Oropharynx is clear and moist.  Eyes: EOM are normal.  Neck: Trachea normal, normal range of motion and full passive range of motion without pain. Neck supple. No JVD present. Carotid bruit is not present. No thyromegaly present.  Cardiovascular: Normal rate, regular rhythm, normal heart sounds and intact distal pulses.  Exam reveals no gallop and no friction rub.   No murmur heard. Pulmonary/Chest: Effort normal and breath sounds normal.  Abdominal: Soft. Bowel sounds are normal. She exhibits no distension and no mass. There is no tenderness.  Musculoskeletal: Normal range of motion.  Lymphadenopathy:    She has no cervical adenopathy.  Neurological: She is alert and oriented to person, place, and time. She has normal reflexes.  Skin: Skin is warm and dry.  Psychiatric: She has a normal mood and affect. Her behavior is normal. Judgment and thought content normal.    BP 135/77  Pulse 72  Temp(Src) 97.6 F (36.4 C) (Oral)  Ht 5\' 6"  (1.676 m)  Wt 196 lb (88.905 kg)  BMI 31.65 kg/m2       Assessment & Plan:   1. Pacemaker-Medtronic   2. IBS (irritable bowel syndrome)   3. Hypertension   4. Hyperglycemia    5. Atrial fibrillation    Orders Placed This Encounter  Procedures  . CMP14+EGFR  . NMR, lipoprofile   Meds ordered this encounter  Medications  . diltiazem (CARDIZEM CD) 180 MG 24 hr capsule    Sig: Take 1 capsule (180 mg total) by mouth daily.    Dispense:  30 capsule    Refill:  4    Order Specific Question:  Supervising Provider    Answer:  Ernestina Penna [[id number]]  . apixaban (ELIQUIS) 5 MG TABS tablet    Sig: Take 1 tablet (5 mg total) by mouth 2 (two) times daily.    Dispense:  60 tablet    Refill:  4    Order Specific Question:  Supervising Provider    Answer:  Ernestina Penna [[id number]]  . pantoprazole (PROTONIX) 40 MG tablet    Sig: Take 1 tablet (40 mg total) by mouth daily.    Dispense:  30 tablet    Refill:  5    Order Specific Question:  Supervising Provider    Answer:  Ernestina Penna [[id number]]  . sotalol (BETAPACE) 240 MG tablet    Sig: Take 1 tablet (240 mg total) by mouth 2 (two) times daily.    Dispense:  60 tablet    Refill:  0    PATIENT NEEDS TO CALL OUR OFFICE TO SCHEDULE A FOLLOW UP APPOINTMENT **NO FURTHER REFILLS WITHOUT APPOINTMENT**    Order Specific Question:  Supervising Provider    Answer:  Ernestina Penna [[id number]]    Continue all meds Labs pending Diet and exercise encouraged Health maintenance reviewed Follow up in 3 months  Durinda-Margaret Daphine Deutscher, FNP

## 2013-03-03 LAB — NMR, LIPOPROFILE
Cholesterol: 189 mg/dL (ref <200)
HDL Particle Number: 39.1 umol/L (ref >=30.5)
LDL Particle Number: 1371 nmol/L — ABNORMAL HIGH (ref <1000)
LDL Size: 21 nm (ref >20.5)
LDLC SERPL CALC-MCNC: 102 mg/dL — ABNORMAL HIGH (ref <100)
LP-IR Score: <25 (ref <=45)
Small LDL Particle Number: 540 nmol/L — ABNORMAL HIGH (ref <=527)
Triglycerides by NMR: 90 mg/dL (ref <150)

## 2013-03-03 LAB — CMP14+EGFR
ALT: 11 IU/L (ref 0–32)
AST: 16 IU/L (ref 0–40)
Albumin: 4.1 g/dL (ref 3.5–4.7)
Alkaline Phosphatase: 58 IU/L (ref 39–117)
BUN/Creatinine Ratio: 19 (ref 11–26)
BUN: 19 mg/dL (ref 8–27)
Chloride: 101 mmol/L (ref 97–108)
Creatinine, Ser: 0.99 mg/dL (ref 0.57–1.00)
GFR calc Af Amer: 59 mL/min/{1.73_m2} — ABNORMAL LOW (ref >59)
Globulin, Total: 2.6 g/dL (ref 1.5–4.5)
Glucose: 107 mg/dL — ABNORMAL HIGH (ref 65–99)
Sodium: 142 mmol/L (ref 134–144)
Total Bilirubin: 0.3 mg/dL (ref 0.0–1.2)
Total Protein: 6.7 g/dL (ref 6.0–8.5)

## 2013-03-03 LAB — SPECIMEN STATUS REPORT

## 2013-03-06 ENCOUNTER — Other Ambulatory Visit: Payer: Self-pay

## 2013-03-06 MED ORDER — POLYSACCHARIDE IRON COMPLEX 150 MG PO CAPS: 150.0000 mg | ORAL_CAPSULE | Freq: Every day | ORAL | Status: DC

## 2013-03-14 ENCOUNTER — Other Ambulatory Visit: Payer: Self-pay | Admitting: Internal Medicine

## 2013-04-05 ENCOUNTER — Other Ambulatory Visit: Payer: Self-pay | Admitting: Nurse Practitioner

## 2013-04-10 ENCOUNTER — Other Ambulatory Visit: Payer: Self-pay | Admitting: *Deleted

## 2013-04-10 ENCOUNTER — Telehealth: Payer: Self-pay | Admitting: Nurse Practitioner

## 2013-04-10 MED ORDER — SOTALOL HCL 240 MG PO TABS: 240.0000 mg | ORAL_TABLET | Freq: Two times a day (BID) | ORAL | Status: DC

## 2013-04-10 NOTE — Telephone Encounter (Signed)
Pt just seen 03/01/13 for chronic medical problems. Doesn't need to be seen and Barnett Applebaum R will refill med

## 2013-05-30 ENCOUNTER — Other Ambulatory Visit: Payer: Self-pay | Admitting: Nurse Practitioner

## 2013-07-07 ENCOUNTER — Other Ambulatory Visit: Payer: Self-pay | Admitting: Nurse Practitioner

## 2013-08-25 ENCOUNTER — Other Ambulatory Visit: Payer: Self-pay | Admitting: Nurse Practitioner

## 2013-08-28 NOTE — Telephone Encounter (Signed)
Last ov 12/14.

## 2013-08-29 ENCOUNTER — Telehealth: Payer: Self-pay | Admitting: *Deleted

## 2013-08-29 NOTE — Telephone Encounter (Signed)
Patient needs f/u with [facility]. Can she be added on 1 of the clinics coming up? or just device clinic? I will contact patient's son with response. I also have to set her up with a cardiologist.

## 2013-08-29 NOTE — Telephone Encounter (Signed)
09/04/13 at 12:00

## 2013-08-29 NOTE — Telephone Encounter (Signed)
Patient's son informed. Nurse advised patient to get recommendations at this visit r/e f/u with cardiologist.

## 2013-09-04 ENCOUNTER — Encounter: Payer: Self-pay | Admitting: Internal Medicine

## 2013-09-04 ENCOUNTER — Ambulatory Visit (INDEPENDENT_AMBULATORY_CARE_PROVIDER_SITE_OTHER): Payer: Medicare Other | Admitting: Internal Medicine

## 2013-09-04 VITALS — BP 136/76 | HR 81 | Ht 68.0 in | Wt 199.0 lb

## 2013-09-04 DIAGNOSIS — I4891 Unspecified atrial fibrillation: Secondary | ICD-10-CM

## 2013-09-04 DIAGNOSIS — I480 Paroxysmal atrial fibrillation: Secondary | ICD-10-CM

## 2013-09-04 LAB — MDC_IDC_ENUM_SESS_TYPE_INCLINIC
Brady Statistic AP VP Percent: 0.9 %
Brady Statistic AP VS Percent: 91.2 %
Brady Statistic AS VP Percent: 0.4 %
Brady Statistic AS VS Percent: 7.5 %
Lead Channel Pacing Threshold Amplitude: 1.25 V
Lead Channel Pacing Threshold Pulse Width: 0.4 ms
Lead Channel Setting Pacing Amplitude: 2 V
Lead Channel Setting Pacing Pulse Width: 0.4 ms
MDC IDC MSMT LEADCHNL RA PACING THRESHOLD AMPLITUDE: 0.75 V
MDC IDC MSMT LEADCHNL RV PACING THRESHOLD PULSEWIDTH: 0.4 ms
MDC IDC MSMT LEADCHNL RV SENSING INTR AMPL: 15.68 mV
MDC IDC SET LEADCHNL RV PACING AMPLITUDE: 2.5 V
MDC IDC SET LEADCHNL RV SENSING SENSITIVITY: 4 mV

## 2013-09-04 NOTE — Progress Notes (Signed)
Carla Gainer, MD: Primary Cardiologist:  Dr Armandina Stammer [name] is a 80 y.o. female with a h/o tachycardia/ bradycardia sp PPM (MDT) by Dr Leonia Reeves in 2008 who presents today for follow-up in the Electrophysiology device clinic.  The patient reports doing very well since I performed generator change in 2013 and remains very active despite her age.  Today, she  denies symptoms of palpitations, chest pain, shortness of breath, orthopnea, PND, dizziness, presyncope, syncope, or neurologic sequela. Her edema is stable. The patientis tolerating medications without difficulties and is otherwise without complaint today.   Past Medical History  Diagnosis Date  . Diastolic heart failure   . Ovarian cancer   . Colon cancer   . Arthritis   . History of Clostridium difficile infection     in Jan 2013  . Pacemaker   . History of cellulitis and abscess   . Hypertension     takes Ramipril,Diltiazem daily  . Atrial fibrillation     takes Coumadin daily  . Tachy-brady syndrome 2008    s/p PPM implantation by Dr Oswald Hillock takes Betapace daily  . Osteoporosis     takes Prolia every 17months injections  . Shortness of breath     with exertion;Albuterol prn  . Bruises easily     takes COumadin daily  . GERD (gastroesophageal reflux disease)     takes Protonix daily  . Constipation     Milk of Mag nightly  . Anemia     takes Iron daily   Past Surgical History  Procedure Laterality Date  . Abdominal hysterectomy    . Oophorectomy    . Colonoscopy  03/30/2011    Procedure: COLONOSCOPY;  Surgeon: Missy Sabins, MD;  Location: [place];  Service: Endoscopy;  Laterality: N/A;  . Esophagogastroduodenoscopy  03/30/2011    Procedure: ESOPHAGOGASTRODUODENOSCOPY (EGD);  Surgeon: Missy Sabins, MD;  Location: [facility] ENDOSCOPY;  Service: Endoscopy;  Laterality: N/A;  . Colon resection  04/03/2011    Procedure: COLON RESECTION LAPAROSCOPIC;  Surgeon: Gayland Curry, MD;  Location: [place];  Service: General;   Laterality: N/A;  Laparoscopic, turned open sigmoid colon resection, lysis of adhesions x 2.5 hours, rigid proctoscopy.  . Pacemaker placement  2008    MDT EnRhythm implanted by Dr Leonia Reeves, generator change (MDT Adapta L) by Dr Rayann Heman 08/04/11  . Colonoscopy  12/24/2011    Procedure: COLONOSCOPY;  Surgeon: Rogene Houston, MD;  Location: [facility];  Service: Endoscopy;  Laterality: N/A;  . Bilateral cataracts    . Battery changed in pacemaker    . Laparoscopic right hemi colectomy  02/19/2012    Procedure: LAPAROSCOPIC RIGHT HEMI COLECTOMY;  Surgeon: Gayland Curry, MD,FACS;  Location: [place];  Service: General;  Laterality: Right;  attempted laparoscopic assisted right hemi colectomy converted to open.  . Partial colectomy  02/19/2012    Procedure: PARTIAL COLECTOMY;  Surgeon: Gayland Curry, MD,FACS;  Location: [place];  Service: General;  Laterality: Right;  . Lysis of adhesion  02/19/2012    Procedure: LYSIS OF ADHESION;  Surgeon: Gayland Curry, MD,FACS;  Location: [place];  Service: General;  Laterality: N/A;    History   Social History  . Marital Status: Widowed    Spouse Name: N/A    Number of Children: N/A  . Years of Education: N/A   Occupational History  . Not on file.   Social History Main Topics  . Smoking status: Never Smoker   . Smokeless tobacco:  Never Used  . Alcohol Use: No  . Drug Use: No  . Sexual Activity: No   Other Topics Concern  . Not on file   Social History Narrative  . No narrative on file    Family History  Problem Relation Age of Onset  . Anesthesia problems Neg Hx   . Hypotension Neg Hx   . Malignant hyperthermia Neg Hx   . Pseudochol deficiency Neg Hx   . Cancer Mother     ovarian    Allergies  Allergen Reactions  . Lasix [Furosemide] Swelling  . Penicillins Nausea And Vomiting and Swelling    Lip swelling and redness  . Sulfa Antibiotics Rash    Current Outpatient Prescriptions  Medication Sig Dispense Refill  . apixaban (ELIQUIS) 5  MG TABS tablet Take 1 tablet (5 mg total) by mouth 2 (two) times daily.  60 tablet  4  . diltiazem (CARDIZEM CD) 180 MG 24 hr capsule Take 1 capsule (180 mg total) by mouth daily.  30 capsule  4  . FERREX 150 150 MG capsule TAKE (1) CAPSULE DAILY  30 capsule  1  . magnesium hydroxide (MILK OF MAGNESIA) 400 MG/5ML suspension Take 30 mLs by mouth at bedtime.       . pantoprazole (PROTONIX) 40 MG tablet Take 1 tablet (40 mg total) by mouth daily.  30 tablet  5  . sotalol (BETAPACE) 120 MG tablet TAKE (2) TABLETS TWICE DAILY.  120 tablet  1  . [DISCONTINUED] famotidine (PEPCID) 20 MG tablet Take 1 tablet (20 mg total) by mouth 2 (two) times daily.      . [DISCONTINUED] promethazine (PHENERGAN) 12.5 MG tablet Take 12.5 mg by mouth every 6 (six) hours as needed.       No current facility-administered medications for this visit.    Physical Exam: Filed Vitals:   09/04/13 1204  BP: 136/76  Pulse: 81  Height: 5\' 8"  (1.727 m)  Weight: 199 lb (90.266 kg)  SpO2: 97%    GEN- The patient is elderly appearing, alert and oriented x 3 today.   Head- normocephalic, atraumatic Eyes-  Sclera clear, conjunctiva pink Ears- hearing intact Oropharynx- clear Neck- supple, Lungs- Clear to ausculation bilaterally, normal work of breathing Chest- pacemaker pocket is well healed Heart- Regular rate and rhythm, no murmurs, rubs or gallops, PMI not laterally displaced GI- soft, NT, ND, + BS Extremities- no clubbing, cyanosis, or edema MS- no significant deformity or atrophy  Pacemaker interrogation- reviewed in detail today,  See PACEART report ekg today reveals atrial pacing at 60 bpm, PR 260 msec, RBBB/LAHB, Qtc 482  Assessment and Plan:  1. Tachycardia/ bradycardia syndrome Normal pacemaker function See Pace Art report No changes today  2. Atrial fibrillation Well controlled Continue eliquis Dr Georgia Duff office to follow CrCl and CBC twice per year (pt prefers PCP do this)  She declines remote  monitoring at this time Return to see Erasmo Downer in 6 months I will see in a year

## 2013-09-04 NOTE — Patient Instructions (Signed)
Your physician recommends that you schedule a follow-up appointment in: 6 months with Kristen in the device clinic. You will receive a reminder letter in the mail in about 4 months reminding you to call and schedule your appointment. If you don't receive this letter, please contact our office. Your physician recommends that you schedule a follow-up appointment in: 1 year with Dr. Rayann Heman. You will receive a reminder letter in the mail in about 10 months reminding you to call and schedule your appointment. If you don't receive this letter, please contact our office. Your physician recommends that you continue on your current medications as directed. Please refer to the Current Medication list given to you today.

## 2013-09-04 NOTE — Addendum Note (Signed)
Addended by: Hilarie Fredrickson T on: 09/04/2013 01:51 PM   Modules accepted: Orders

## 2013-09-11 ENCOUNTER — Other Ambulatory Visit: Payer: Self-pay | Admitting: Nurse Practitioner

## 2013-09-12 ENCOUNTER — Encounter: Payer: Self-pay | Admitting: Family Medicine

## 2013-09-12 NOTE — Telephone Encounter (Signed)
Patient NTBS for follow up and lab work for future refills

## 2013-09-12 NOTE — Telephone Encounter (Signed)
Last seen 03/01/13 MMM

## 2013-09-15 ENCOUNTER — Other Ambulatory Visit: Payer: Self-pay | Admitting: Nurse Practitioner

## 2013-09-18 ENCOUNTER — Ambulatory Visit (INDEPENDENT_AMBULATORY_CARE_PROVIDER_SITE_OTHER): Payer: Medicare Other | Admitting: Nurse Practitioner

## 2013-09-18 ENCOUNTER — Encounter: Payer: Self-pay | Admitting: Nurse Practitioner

## 2013-09-18 VITALS — BP 180/90 | HR 85 | Temp 98.6°F | Ht <= 58 in | Wt 201.4 lb

## 2013-09-18 DIAGNOSIS — R5381 Other malaise: Secondary | ICD-10-CM

## 2013-09-18 DIAGNOSIS — R2681 Unsteadiness on feet: Secondary | ICD-10-CM

## 2013-09-18 DIAGNOSIS — R5383 Other fatigue: Secondary | ICD-10-CM

## 2013-09-18 DIAGNOSIS — I4891 Unspecified atrial fibrillation: Secondary | ICD-10-CM

## 2013-09-18 DIAGNOSIS — K589 Irritable bowel syndrome without diarrhea: Secondary | ICD-10-CM

## 2013-09-18 DIAGNOSIS — Z950 Presence of cardiac pacemaker: Secondary | ICD-10-CM

## 2013-09-18 DIAGNOSIS — K219 Gastro-esophageal reflux disease without esophagitis: Secondary | ICD-10-CM

## 2013-09-18 DIAGNOSIS — R5382 Chronic fatigue, unspecified: Secondary | ICD-10-CM

## 2013-09-18 DIAGNOSIS — I10 Essential (primary) hypertension: Secondary | ICD-10-CM

## 2013-09-18 DIAGNOSIS — D500 Iron deficiency anemia secondary to blood loss (chronic): Secondary | ICD-10-CM

## 2013-09-18 DIAGNOSIS — R269 Unspecified abnormalities of gait and mobility: Secondary | ICD-10-CM

## 2013-09-18 MED ORDER — POLYSACCHARIDE IRON COMPLEX 150 MG PO CAPS: ORAL_CAPSULE | ORAL | Status: DC

## 2013-09-18 MED ORDER — SOTALOL HCL 120 MG PO TABS: ORAL_TABLET | ORAL | Status: DC

## 2013-09-18 MED ORDER — DILTIAZEM HCL ER COATED BEADS 180 MG PO CP24: 180.0000 mg | ORAL_CAPSULE | Freq: Every day | ORAL | Status: DC

## 2013-09-18 MED ORDER — PANTOPRAZOLE SODIUM 40 MG PO TBEC: 40.0000 mg | DELAYED_RELEASE_TABLET | Freq: Every day | ORAL | Status: DC

## 2013-09-18 MED ORDER — APIXABAN 5 MG PO TABS: 5.0000 mg | ORAL_TABLET | Freq: Two times a day (BID) | ORAL | Status: DC

## 2013-09-18 NOTE — Progress Notes (Signed)
Subjective:    Patient ID: [name], female    DOB: 09-16-1922, 80 y.o.   MRN: [id number]  HPI [name] is brought in today by her son. Her daughter called last week and said that she has become very weak . Hardly ever goes out of the house anymore because she says she just doesn't have the energy. She has a history anemia along with multiple other problems. FAmily concerned because she use to stay on the go all the time. SHe always has excuses for not going somewhere. Sn says that she is not getting out and walking around very much and her weight is going up because of lack of exercise. SHe will not even go to the grocery store anymore.  Patient Active Problem List   Diagnosis Date Noted  . Lacerations of multiple sites of left arm 11/07/2012  . Chronic anticoagulation 04/07/2012  . Stroke 02/26/2012  . Hyperglycemia 02/26/2012  . Cancer of right colon -02/2012 02/25/2012  . Tachycardia-bradycardia 07/30/2011  . Cancer of sigmoid colon s/p sigmoid colectomy for T3N0 lesion on 1/18 04/08/2011  . Atrial flutter with rapid ventricular response 03/27/2011  . Ovarian cancer 03/27/2011  . Anemia 03/27/2011  . Pacemaker-Medtronic 05/31/2010  . Edema 05/31/2010  . Hypertension 05/31/2010  . Atrial fibrillation 05/31/2010  . IBS (irritable bowel syndrome) 05/31/2010  . Osteoporosis 05/31/2010   Outpatient Encounter Prescriptions as of 09/18/2013  Medication Sig  . apixaban (ELIQUIS) 5 MG TABS tablet Take 1 tablet (5 mg total) by mouth 2 (two) times daily.  Marland Kitchen diltiazem (CARDIZEM CD) 180 MG 24 hr capsule Take 1 capsule (180 mg total) by mouth daily.  Marland Kitchen FERREX 150 150 MG capsule TAKE (1) CAPSULE DAILY  . magnesium hydroxide (MILK OF MAGNESIA) 400 MG/5ML suspension Take 30 mLs by mouth at bedtime.   . pantoprazole (PROTONIX) 40 MG tablet Take 1 tablet (40 mg total) by mouth daily.  . sotalol (BETAPACE) 120 MG tablet TAKE (2) TABLETS TWICE DAILY.       Review of Systems  Constitutional:  Negative.   HENT: Negative.   Respiratory: Negative.   Cardiovascular: Negative.   Genitourinary: Negative.   Musculoskeletal: Positive for back pain and gait problem.  Neurological: Negative.   Psychiatric/Behavioral: Negative.        Objective:   Physical Exam  Constitutional: She is oriented to person, place, and time. She appears well-developed and well-nourished.  HENT:  Head: Normocephalic.  Right Ear: External ear normal.  Left Ear: External ear normal.  Nose: Nose normal.  Mouth/Throat: Oropharynx is clear and moist.  Eyes: Pupils are equal, round, and reactive to light.  Neck: Normal range of motion. Neck supple.  Cardiovascular: Normal rate, regular rhythm and normal heart sounds.   Pulmonary/Chest: Effort normal and breath sounds normal.  Abdominal: Soft. Bowel sounds are normal.  Musculoskeletal: Normal range of motion.  unsteady gait  Lymphadenopathy:    She has no cervical adenopathy.  Neurological: She is alert and oriented to person, place, and time.  Skin: Skin is warm and dry.  Psychiatric: She has a normal mood and affect. Her behavior is normal. Judgment and thought content normal.   BP 180/90  Pulse 85  Temp(Src) 98.6 F (37 C) (Oral)  Ht 3' (0.914 m)  Wt 201 lb 6.4 oz (91.354 kg)  BMI 109.35 kg/m2  PF 68 L/min        Assessment & Plan:   1. Unsteady gait   2. IBS (irritable bowel syndrome)  3. Essential hypertension   4. Atrial fibrillation, unspecified   5. Iron deficiency anemia due to chronic blood loss   6. Chronic fatigue   7. Gastroesophageal reflux disease without esophagitis   8. Pacemaker    Orders Placed This Encounter  Procedures  . CMP14+EGFR  . NMR, lipoprofile  . Anemia Profile B  . Thyroid Panel With TSH  . Ambulatory referral to Physical Therapy    Referral Priority:  Routine    Referral Type:  Physical Medicine    Referral Reason:  Specialty Services Required    Requested Specialty:  Physical Therapy    Number  of Visits Requested:  1   Meds ordered this encounter  Medications  . sotalol (BETAPACE) 120 MG tablet    Sig: TAKE (2) TABLETS TWICE DAILY.    Dispense:  120 tablet    Refill:  5    Order Specific Question:  Supervising Provider    Answer:  Chipper Herb [[id number]]  . pantoprazole (PROTONIX) 40 MG tablet    Sig: Take 1 tablet (40 mg total) by mouth daily.    Dispense:  30 tablet    Refill:  5    Order Specific Question:  Supervising Provider    Answer:  Chipper Herb [[id number]]  . iron polysaccharides (FERREX 150) 150 MG capsule    Sig: TAKE (1) CAPSULE DAILY    Dispense:  30 capsule    Refill:  5    Order Specific Question:  Supervising Provider    Answer:  Chipper Herb [[id number]]  . diltiazem (CARDIZEM CD) 180 MG 24 hr capsule    Sig: Take 1 capsule (180 mg total) by mouth daily.    Dispense:  30 capsule    Refill:  4    Order Specific Question:  Supervising Provider    Answer:  Chipper Herb [[id number]]  . apixaban (ELIQUIS) 5 MG TABS tablet    Sig: Take 1 tablet (5 mg total) by mouth 2 (two) times daily.    Dispense:  60 tablet    Refill:  4    Order Specific Question:  Supervising Provider    Answer:  Chipper Herb [place] maintenance reviewed Encouraged to get out oh house at least 3x a week Ordered physical therapy Continue all meds Follow up  In 3 months   [place], FNP

## 2013-09-18 NOTE — Patient Instructions (Signed)

## 2013-09-19 LAB — CMP14+EGFR
A/G RATIO: 1.7 (ref 1.1–2.5)
ALK PHOS: 77 IU/L (ref 39–117)
ALT: 13 IU/L (ref 0–32)
AST: 18 IU/L (ref 0–40)
Albumin: 4.2 g/dL (ref 3.5–4.7)
BUN / CREAT RATIO: 19 (ref 11–26)
BUN: 17 mg/dL (ref 8–27)
CO2: 26 mmol/L (ref 18–29)
Calcium: 9.2 mg/dL (ref 8.7–10.3)
Chloride: 102 mmol/L (ref 97–108)
Creatinine, Ser: 0.89 mg/dL (ref 0.57–1.00)
GFR calc Af Amer: 66 mL/min/{1.73_m2} (ref >59)
GFR, EST NON AFRICAN AMERICAN: 58 mL/min/{1.73_m2} — AB (ref >59)
GLUCOSE: 114 mg/dL — AB (ref 65–99)
Globulin, Total: 2.5 g/dL (ref 1.5–4.5)
Potassium: 4.8 mmol/L (ref 3.5–5.2)
Sodium: 143 mmol/L (ref 134–144)
TOTAL PROTEIN: 6.7 g/dL (ref 6.0–8.5)
Total Bilirubin: 0.3 mg/dL (ref 0.0–1.2)

## 2013-09-19 LAB — ANEMIA PROFILE B
BASOS: 0 %
Basophils Absolute: 0 10*3/uL (ref 0.0–0.2)
Eos: 1 %
Eosinophils Absolute: 0.1 10*3/uL (ref 0.0–0.4)
Ferritin: 10 ng/mL — ABNORMAL LOW (ref 15–150)
HCT: 38.4 % (ref 34.0–46.6)
HEMOGLOBIN: 12.4 g/dL (ref 11.1–15.9)
IMMATURE GRANS (ABS): 0 10*3/uL (ref 0.0–0.1)
IRON: 67 ug/dL (ref 35–155)
Immature Granulocytes: 0 %
Iron Saturation: 17 % (ref 15–55)
LYMPHS: 23 %
Lymphocytes Absolute: 1.4 10*3/uL (ref 0.7–3.1)
MCH: 27 pg (ref 26.6–33.0)
MCHC: 32.3 g/dL (ref 31.5–35.7)
MCV: 84 fL (ref 79–97)
MONOCYTES: 12 %
Monocytes Absolute: 0.7 10*3/uL (ref 0.1–0.9)
NEUTROS PCT: 64 %
Neutrophils Absolute: 3.7 10*3/uL (ref 1.4–7.0)
Platelets: 261 10*3/uL (ref 150–379)
RBC: 4.6 x10E6/uL (ref 3.77–5.28)
RDW: 16.5 % — ABNORMAL HIGH (ref 12.3–15.4)
RETIC CT PCT: 1.7 % (ref 0.6–2.6)
TIBC: 402 ug/dL (ref 250–450)
UIBC: 335 ug/dL (ref 150–375)
VITAMIN B 12: 384 pg/mL (ref 211–946)
WBC: 5.8 10*3/uL (ref 3.4–10.8)

## 2013-09-19 LAB — NMR, LIPOPROFILE
Cholesterol: 179 mg/dL (ref 100–199)
HDL Cholesterol by NMR: 60 mg/dL (ref >39)
HDL PARTICLE NUMBER: 38.5 umol/L (ref >=30.5)
LDL Particle Number: 1088 nmol/L — ABNORMAL HIGH (ref <1000)
LDL Size: 21.3 nm (ref >20.5)
LDLC SERPL CALC-MCNC: 83 mg/dL (ref 0–99)
LP-IR Score: <25 (ref <=45)
Small LDL Particle Number: 307 nmol/L (ref <=527)
TRIGLYCERIDES BY NMR: 182 mg/dL — AB (ref 0–149)

## 2013-09-21 ENCOUNTER — Telehealth: Payer: Self-pay | Admitting: Family Medicine

## 2013-09-21 NOTE — Telephone Encounter (Signed)
Message copied by Waverly Ferrari on Thu Sep 21, 2013  9:56 AM ------      Message from: Chevis Pretty      Created: Thu Sep 21, 2013  8:00 AM       Kidney and liver function stable      Cholesterol looks good      Anemia panel normal      Call daughter or son with results ------

## 2013-09-28 ENCOUNTER — Other Ambulatory Visit: Payer: Self-pay

## 2013-09-28 DIAGNOSIS — R2681 Unsteadiness on feet: Secondary | ICD-10-CM

## 2013-10-17 ENCOUNTER — Ambulatory Visit: Payer: Medicare Other | Attending: Family Medicine | Admitting: Physical Therapy

## 2013-10-17 DIAGNOSIS — R269 Unspecified abnormalities of gait and mobility: Secondary | ICD-10-CM | POA: Insufficient documentation

## 2013-10-17 DIAGNOSIS — R5381 Other malaise: Secondary | ICD-10-CM | POA: Diagnosis not present

## 2013-10-17 DIAGNOSIS — IMO0001 Reserved for inherently not codable concepts without codable children: Secondary | ICD-10-CM | POA: Diagnosis present

## 2013-10-20 ENCOUNTER — Other Ambulatory Visit: Payer: Self-pay | Admitting: Nurse Practitioner

## 2013-10-24 ENCOUNTER — Ambulatory Visit: Payer: Medicare Other | Admitting: Physical Therapy

## 2013-10-24 DIAGNOSIS — IMO0001 Reserved for inherently not codable concepts without codable children: Secondary | ICD-10-CM | POA: Diagnosis not present

## 2013-10-29 ENCOUNTER — Encounter: Payer: Self-pay | Admitting: *Deleted

## 2013-10-31 ENCOUNTER — Ambulatory Visit: Payer: Medicare Other | Admitting: Physical Therapy

## 2013-10-31 DIAGNOSIS — IMO0001 Reserved for inherently not codable concepts without codable children: Secondary | ICD-10-CM | POA: Diagnosis not present

## 2013-11-07 ENCOUNTER — Ambulatory Visit: Payer: Medicare Other | Admitting: Physical Therapy

## 2013-11-07 DIAGNOSIS — IMO0001 Reserved for inherently not codable concepts without codable children: Secondary | ICD-10-CM | POA: Diagnosis not present

## 2013-11-14 ENCOUNTER — Ambulatory Visit: Payer: Medicare Other | Attending: Family Medicine | Admitting: Physical Therapy

## 2013-11-14 DIAGNOSIS — IMO0001 Reserved for inherently not codable concepts without codable children: Secondary | ICD-10-CM | POA: Insufficient documentation

## 2013-11-14 DIAGNOSIS — R269 Unspecified abnormalities of gait and mobility: Secondary | ICD-10-CM | POA: Diagnosis not present

## 2013-11-14 DIAGNOSIS — R5381 Other malaise: Secondary | ICD-10-CM | POA: Diagnosis not present

## 2013-11-21 ENCOUNTER — Encounter: Payer: Medicare Other | Admitting: *Deleted

## 2013-11-21 ENCOUNTER — Encounter: Payer: Self-pay | Admitting: Family

## 2013-11-21 ENCOUNTER — Ambulatory Visit (INDEPENDENT_AMBULATORY_CARE_PROVIDER_SITE_OTHER): Payer: Medicare Other | Admitting: Family

## 2013-11-21 ENCOUNTER — Other Ambulatory Visit: Payer: Self-pay | Admitting: Family

## 2013-11-21 VITALS — BP 156/83 | HR 69 | Temp 98.5°F | Wt 194.0 lb

## 2013-11-21 DIAGNOSIS — R350 Frequency of micturition: Secondary | ICD-10-CM

## 2013-11-21 DIAGNOSIS — R112 Nausea with vomiting, unspecified: Secondary | ICD-10-CM

## 2013-11-21 DIAGNOSIS — R7989 Other specified abnormal findings of blood chemistry: Secondary | ICD-10-CM

## 2013-11-21 DIAGNOSIS — N390 Urinary tract infection, site not specified: Secondary | ICD-10-CM

## 2013-11-21 LAB — POCT CBC
GRANULOCYTE PERCENT: 86.5 % — AB (ref 37–80)
HCT, POC: 26.8 % — AB (ref 37.7–47.9)
Hemoglobin: 8.3 g/dL — AB (ref 12.2–16.2)
Lymph, poc: 1 (ref 0.6–3.4)
MCH, POC: 23.6 pg — AB (ref 27–31.2)
MCHC: 30.8 g/dL — AB (ref 31.8–35.4)
MCV: 76.7 fL — AB (ref 80–97)
MPV: 7.6 fL (ref 0–99.8)
POC GRANULOCYTE: 9.8 — AB (ref 2–6.9)
POC LYMPH %: 8.8 % — AB (ref 10–50)
Platelet Count, POC: 305 10*3/uL (ref 142–424)
RBC: 3.5 M/uL — AB (ref 4.04–5.48)
RDW, POC: 16 %
WBC: 11.3 10*3/uL — AB (ref 4.6–10.2)

## 2013-11-21 LAB — POCT UA - MICROSCOPIC ONLY
CRYSTALS, UR, HPF, POC: NEGATIVE
Casts, Ur, LPF, POC: NEGATIVE
Mucus, UA: NEGATIVE
Yeast, UA: NEGATIVE

## 2013-11-21 LAB — POCT URINALYSIS DIPSTICK
BILIRUBIN UA: NEGATIVE
Blood, UA: NEGATIVE
Glucose, UA: NEGATIVE
KETONES UA: NEGATIVE
Nitrite, UA: POSITIVE
PH UA: 7.5
PROTEIN UA: NEGATIVE
SPEC GRAV UA: 1.01
Urobilinogen, UA: NEGATIVE

## 2013-11-21 MED ORDER — ONDANSETRON HCL 4 MG PO TABS: 4.0000 mg | ORAL_TABLET | Freq: Three times a day (TID) | ORAL | Status: AC | PRN

## 2013-11-21 MED ORDER — NITROFURANTOIN MONOHYD MACRO 100 MG PO CAPS: 100.0000 mg | ORAL_CAPSULE | Freq: Two times a day (BID) | ORAL | Status: DC

## 2013-11-21 NOTE — Patient Instructions (Addendum)

## 2013-11-21 NOTE — Addendum Note (Signed)
Addended by: Evelina Dun A on: 11/21/2013 11:41 AM   Modules accepted: Orders

## 2013-11-21 NOTE — Progress Notes (Signed)
Subjective:    Patient ID: [name], female    DOB: 03-Dec-1922, 80 y.o.   MRN: [id number]  Emesis  This is a new problem. The current episode started today. The problem occurs 5 to 10 times per day. The problem has been waxing and waning. The emesis has an appearance of stomach contents. There has been no fever. Associated symptoms include abdominal pain and myalgias. Pertinent negatives include no chills, coughing, diarrhea or headaches. She has tried increased fluids for the symptoms. The treatment provided mild relief.  Urinary Frequency  This is a new problem. The current episode started in the past 7 days. The problem occurs intermittently. The problem has been waxing and waning. The patient is experiencing no pain. There has been no fever. Associated symptoms include frequency and vomiting. Pertinent negatives include no chills.      Review of Systems  Constitutional: Negative.  Negative for chills.  HENT: Negative.   Eyes: Negative.   Respiratory: Negative.  Negative for cough and shortness of breath.   Cardiovascular: Negative.  Negative for palpitations.  Gastrointestinal: Positive for vomiting and abdominal pain. Negative for diarrhea.  Endocrine: Negative.   Genitourinary: Positive for frequency.  Musculoskeletal: Positive for myalgias.  Neurological: Negative.  Negative for headaches.  Hematological: Negative.   Psychiatric/Behavioral: Negative.   All other systems reviewed and are negative.      Objective:   Physical Exam  Vitals reviewed. Constitutional: She is oriented to person, place, and time. She appears well-developed and well-nourished. No distress.  HENT:  Head: Normocephalic and atraumatic.  Right Ear: External ear normal.  Left Ear: External ear normal.  Mouth/Throat: Oropharynx is clear and moist.  Eyes: Pupils are equal, round, and reactive to light.  Neck: Normal range of motion. Neck supple. No thyromegaly present.  Cardiovascular: Normal  rate, regular rhythm, normal heart sounds and intact distal pulses.   No murmur heard. Pulmonary/Chest: Effort normal and breath sounds normal. No respiratory distress. She has no wheezes.  Abdominal: Soft. Bowel sounds are normal. She exhibits no distension. There is no tenderness.  Musculoskeletal: Normal range of motion. She exhibits no edema and no tenderness.  Neg CVA tenderness   Neurological: She is alert and oriented to person, place, and time. She has normal reflexes. No cranial nerve deficit.  Skin: Skin is warm and dry.  Psychiatric: She has a normal mood and affect. Her behavior is normal. Judgment and thought content normal.      BP 156/83  Pulse 69  Temp(Src) 98.5 F (36.9 C) (Oral)  Wt 194 lb (87.998 kg)     Assessment & Plan:  1. Frequency of urination - POCT UA - Microscopic Only - POCT urinalysis dipstick - nitrofurantoin, macrocrystal-monohydrate, (MACROBID) 100 MG capsule; Take 1 capsule (100 mg total) by mouth 2 (two) times daily.  Dispense: 10 capsule; Refill: 0  2. Non-intractable vomiting with nausea, vomiting of unspecified type - ondansetron (ZOFRAN) 4 MG tablet; Take 1 tablet (4 mg total) by mouth every 8 (eight) hours as needed for nausea or vomiting.  Dispense: 30 tablet; Refill: 1 - nitrofurantoin, macrocrystal-monohydrate, (MACROBID) 100 MG capsule; Take 1 capsule (100 mg total) by mouth 2 (two) times daily.  Dispense: 10 capsule; Refill: 0  3. Urinary tract infection, site not specified -Rest -Force fluids - nitrofurantoin, macrocrystal-monohydrate, (MACROBID) 100 MG capsule; Take 1 capsule (100 mg total) by mouth 2 (two) times daily.  Dispense: 10 capsule; Refill: 0 -RTO prn   Evelina Dun, FNP

## 2013-11-21 NOTE — Addendum Note (Signed)
Addended by: Pollyann Kennedy F on: 11/21/2013 01:38 PM   Modules accepted: Orders

## 2013-11-22 ENCOUNTER — Encounter: Payer: Self-pay | Admitting: Family Medicine

## 2013-11-22 ENCOUNTER — Other Ambulatory Visit: Payer: Self-pay | Admitting: Nurse Practitioner

## 2013-11-22 LAB — CMP14+EGFR
A/G RATIO: 1.6 (ref 1.1–2.5)
ALT: 10 IU/L (ref 0–32)
AST: 16 IU/L (ref 0–40)
Albumin: 4.1 g/dL (ref 3.5–4.7)
Alkaline Phosphatase: 69 IU/L (ref 39–117)
BILIRUBIN TOTAL: 0.3 mg/dL (ref 0.0–1.2)
BUN/Creatinine Ratio: 20 (ref 11–26)
BUN: 18 mg/dL (ref 8–27)
CALCIUM: 9.2 mg/dL (ref 8.7–10.3)
CO2: 26 mmol/L (ref 18–29)
CREATININE: 0.9 mg/dL (ref 0.57–1.00)
Chloride: 102 mmol/L (ref 97–108)
GFR calc non Af Amer: 57 mL/min/{1.73_m2} — ABNORMAL LOW (ref >59)
GFR, EST AFRICAN AMERICAN: 66 mL/min/{1.73_m2} (ref >59)
GLOBULIN, TOTAL: 2.6 g/dL (ref 1.5–4.5)
Glucose: 130 mg/dL — ABNORMAL HIGH (ref 65–99)
POTASSIUM: 4.5 mmol/L (ref 3.5–5.2)
SODIUM: 145 mmol/L — AB (ref 134–144)
TOTAL PROTEIN: 6.7 g/dL (ref 6.0–8.5)

## 2013-11-23 LAB — URINE CULTURE

## 2013-11-24 ENCOUNTER — Ambulatory Visit (INDEPENDENT_AMBULATORY_CARE_PROVIDER_SITE_OTHER): Payer: Medicare Other | Admitting: Nurse Practitioner

## 2013-11-24 ENCOUNTER — Other Ambulatory Visit: Payer: Self-pay | Admitting: Family

## 2013-11-24 ENCOUNTER — Ambulatory Visit (INDEPENDENT_AMBULATORY_CARE_PROVIDER_SITE_OTHER): Payer: Medicare Other

## 2013-11-24 ENCOUNTER — Encounter: Payer: Self-pay | Admitting: Nurse Practitioner

## 2013-11-24 VITALS — BP 129/70 | HR 88 | Temp 98.3°F | Ht 62.0 in | Wt 197.0 lb

## 2013-11-24 DIAGNOSIS — D649 Anemia, unspecified: Secondary | ICD-10-CM

## 2013-11-24 DIAGNOSIS — R109 Unspecified abdominal pain: Secondary | ICD-10-CM

## 2013-11-24 LAB — POCT CBC
Granulocyte percent: 72.4 %G (ref 37–80)
HCT, POC: 25 % — AB (ref 37.7–47.9)
Hemoglobin: 7.7 g/dL — AB (ref 12.2–16.2)
Lymph, poc: 1.4 (ref 0.6–3.4)
MCH, POC: 23.6 pg — AB (ref 27–31.2)
MCHC: 31 g/dL — AB (ref 31.8–35.4)
MCV: 76.3 fL — AB (ref 80–97)
MPV: 7.5 fL (ref 0–99.8)
POC Granulocyte: 4.8 (ref 2–6.9)
POC LYMPH PERCENT: 21 %L (ref 10–50)
Platelet Count, POC: 287 10*3/uL (ref 142–424)
RBC: 3.3 M/uL — AB (ref 4.04–5.48)
RDW, POC: 16.3 %
WBC: 6.6 10*3/uL (ref 4.6–10.2)

## 2013-11-24 MED ORDER — FERROUS SULFATE 325 (65 FE) MG PO TABS
325.0000 mg | ORAL_TABLET | Freq: Three times a day (TID) | ORAL | Status: AC
Start: 2013-11-24 — End: ?

## 2013-11-24 NOTE — Patient Instructions (Signed)
Anemia, Nonspecific Anemia is a condition in which the concentration of red blood cells or hemoglobin in the blood is below normal. Hemoglobin is a substance in red blood cells that carries oxygen to the tissues of the body. Anemia results in not enough oxygen reaching these tissues.  CAUSES  Common causes of anemia include:   Excessive bleeding. Bleeding may be internal or external. This includes excessive bleeding from periods (in women) or from the intestine.   Poor nutrition.   Chronic kidney, thyroid, and liver disease.  Bone marrow disorders that decrease red blood cell production.  Cancer and treatments for cancer.  HIV, AIDS, and their treatments.  Spleen problems that increase red blood cell destruction.  Blood disorders.  Excess destruction of red blood cells due to infection, medicines, and autoimmune disorders. SIGNS AND SYMPTOMS   Minor weakness.   Dizziness.   Headache.  Palpitations.   Shortness of breath, especially with exercise.   Paleness.  Cold sensitivity.  Indigestion.  Nausea.  Difficulty sleeping.  Difficulty concentrating. Symptoms may occur suddenly or they may develop slowly.  DIAGNOSIS  Additional blood tests are often needed. These help your health care provider determine the best treatment. Your health care provider will check your stool for blood and look for other causes of blood loss.  TREATMENT  Treatment varies depending on the cause of the anemia. Treatment can include:   Supplements of iron, vitamin B12, or folic acid.   Hormone medicines.   A blood transfusion. This may be needed if blood loss is severe.   Hospitalization. This may be needed if there is significant continual blood loss.   Dietary changes.  Spleen removal. HOME CARE INSTRUCTIONS Keep all follow-up appointments. It often takes many weeks to correct anemia, and having your health care provider check on your condition and your response to  treatment is very important. SEEK IMMEDIATE MEDICAL CARE IF:   You develop extreme weakness, shortness of breath, or chest pain.   You become dizzy or have trouble concentrating.  You develop heavy vaginal bleeding.   You develop a rash.   You have bloody or black, tarry stools.   You faint.   You vomit up blood.   You vomit repeatedly.   You have abdominal pain.  You have a fever or persistent symptoms for more than 2-3 days.   You have a fever and your symptoms suddenly get worse.   You are dehydrated.  MAKE SURE YOU:  Understand these instructions.  Will watch your condition.  Will get help right away if you are not doing well or get worse. Document Released: 04/09/2004 Document Revised: 11/02/2012 Document Reviewed: 08/26/2012 Exit[employer] Patient Information 2015 [employer]. This information is not intended to replace advice given to you by your health care provider. Make sure you discuss any questions you have with your health care provider.  

## 2013-11-24 NOTE — Progress Notes (Signed)
Subjective:    Patient ID: [name], female    DOB: 04/07/1922, 80 y.o.   MRN: [id number]  HPI Patient brought in  By daughter in law c/o nausea and burning sensation in stomach- just didn't feel like herself. Was given macrobid  And zofran- Daughter in law said that she wanted a zofran last night but they told her she did not need it because she was  Not feeling sick. So when she got uo ths morning she told her caregiver that she was suppose to take zofran and she gave her one. She did not c/o feeling funny until a little while after taking zofran. Started feeling dizzy.    Review of Systems  Constitutional: Negative.   HENT: Negative.   Respiratory: Negative.   Cardiovascular: Negative.   Genitourinary: Negative.   Neurological: Positive for dizziness.  Psychiatric/Behavioral: Negative.   All other systems reviewed and are negative.      Objective:   Physical Exam  Constitutional: She is oriented to person, place, and time. She appears well-developed and well-nourished.  Cardiovascular: Normal rate, regular rhythm and normal heart sounds.   Pulmonary/Chest: Effort normal and breath sounds normal.  Neurological: She is alert and oriented to person, place, and time.  Skin: Skin is warm and dry.  Psychiatric: She has a normal mood and affect. Her behavior is normal. Judgment and thought content normal.    BP 129/70  Pulse 88  Temp(Src) 98.3 F (36.8 C) (Oral)  Ht '5\' 2"'  (1.575 m)  Wt 197 lb (89.359 kg)  BMI 36.02 kg/m2 Results for orders placed in visit on 11/24/13  POCT CBC      Result Value Ref Range   WBC 6.6  4.6 - 10.2 K/uL   Lymph, poc 1.4  0.6 - 3.4   POC LYMPH PERCENT 21.0  10 - 50 %L   MID (cbc)    0 - 0.9   POC MID %    0 - 12 %M   POC Granulocyte 4.8  2 - 6.9   Granulocyte percent 72.4  37 - 80 %G   RBC 3.3 (*) 4.04 - 5.48 M/uL   Hemoglobin 7.7 (*) 12.2 - 16.2 g/dL   HCT, POC 25.0 (*) 37.7 - 47.9 %   MCV 76.3 (*) 80 - 97 fL   MCH, POC 23.6 (*) 27 -  31.2 pg   MCHC 31.0 (*) 31.8 - 35.4 g/dL   RDW, POC 16.3     Platelet Count, POC 287.0  142 - 424 K/uL   MPV 7.5  0 - 99.8 fL   KUB- moderate stool burden-Preliminary reading by Ronnald Collum, FNP  [facility]       Assessment & Plan:   1. Anemia, unspecified anemia type   2. Abdominal pain, unspecified site    Orders Placed This Encounter  Procedures  . DG Abd 1 View    Standing Status: Future     Number of Occurrences: 1     Standing Expiration Date: 01/24/2015    Order Specific Question:  Reason for Exam (SYMPTOM  OR DIAGNOSIS REQUIRED)    Answer:  abd pain    Order Specific Question:  Preferred imaging location?    Answer:  Internal  . CMP14+EGFR  . Ambulatory referral to Gastroenterology    Referral Priority:  Routine    Referral Type:  Consultation    Referral Reason:  Specialty Services Required    Referred to Provider:  Missy Sabins, MD  Requested Specialty:  Gastroenterology    Number of Visits Requested:  1  . POCT CBC    Continue iron supplements Eat raisins Referral to GI RTO Monday to recheck hgb   Latish-Margaret Hassell Done, FNP

## 2013-11-25 LAB — CMP14+EGFR
A/G RATIO: 1.5 (ref 1.1–2.5)
ALBUMIN: 4.1 g/dL (ref 3.5–4.7)
ALT: 8 IU/L (ref 0–32)
AST: 17 IU/L (ref 0–40)
Alkaline Phosphatase: 69 IU/L (ref 39–117)
BUN/Creatinine Ratio: 15 (ref 11–26)
BUN: 16 mg/dL (ref 8–27)
CALCIUM: 8.9 mg/dL (ref 8.7–10.3)
CO2: 27 mmol/L (ref 18–29)
Chloride: 98 mmol/L (ref 97–108)
Creatinine, Ser: 1.05 mg/dL — ABNORMAL HIGH (ref 0.57–1.00)
GFR calc Af Amer: 54 mL/min/{1.73_m2} — ABNORMAL LOW (ref >59)
GFR calc non Af Amer: 47 mL/min/{1.73_m2} — ABNORMAL LOW (ref >59)
Globulin, Total: 2.7 g/dL (ref 1.5–4.5)
Glucose: 110 mg/dL — ABNORMAL HIGH (ref 65–99)
Potassium: 4.6 mmol/L (ref 3.5–5.2)
Sodium: 138 mmol/L (ref 134–144)
Total Bilirubin: 0.3 mg/dL (ref 0.0–1.2)
Total Protein: 6.8 g/dL (ref 6.0–8.5)

## 2013-11-27 ENCOUNTER — Encounter: Payer: Self-pay | Admitting: Nurse Practitioner

## 2013-11-27 ENCOUNTER — Encounter (HOSPITAL_COMMUNITY): Payer: Self-pay | Admitting: Emergency Medicine

## 2013-11-27 ENCOUNTER — Inpatient Hospital Stay (HOSPITAL_COMMUNITY)
Admission: EM | Admit: 2013-11-27 | Discharge: 2013-12-01 | DRG: 375 | Disposition: A | Discharge status: Home / Self Care | Payer: Medicare Other | Attending: Internal Medicine | Admitting: Internal Medicine

## 2013-11-27 ENCOUNTER — Ambulatory Visit (INDEPENDENT_AMBULATORY_CARE_PROVIDER_SITE_OTHER): Payer: Medicare Other | Admitting: Nurse Practitioner

## 2013-11-27 VITALS — BP 150/62 | HR 84 | Temp 98.3°F | Ht 62.0 in | Wt 197.0 lb

## 2013-11-27 DIAGNOSIS — Z8543 Personal history of malignant neoplasm of ovary: Secondary | ICD-10-CM

## 2013-11-27 DIAGNOSIS — C182 Malignant neoplasm of ascending colon: Secondary | ICD-10-CM

## 2013-11-27 DIAGNOSIS — K449 Diaphragmatic hernia without obstruction or gangrene: Secondary | ICD-10-CM | POA: Diagnosis present

## 2013-11-27 DIAGNOSIS — Z882 Allergy status to sulfonamides status: Secondary | ICD-10-CM

## 2013-11-27 DIAGNOSIS — C189 Malignant neoplasm of colon, unspecified: Secondary | ICD-10-CM

## 2013-11-27 DIAGNOSIS — C771 Secondary and unspecified malignant neoplasm of intrathoracic lymph nodes: Secondary | ICD-10-CM | POA: Diagnosis present

## 2013-11-27 DIAGNOSIS — I480 Paroxysmal atrial fibrillation: Secondary | ICD-10-CM

## 2013-11-27 DIAGNOSIS — K5900 Constipation, unspecified: Secondary | ICD-10-CM | POA: Diagnosis present

## 2013-11-27 DIAGNOSIS — K921 Melena: Secondary | ICD-10-CM | POA: Diagnosis present

## 2013-11-27 DIAGNOSIS — Z950 Presence of cardiac pacemaker: Secondary | ICD-10-CM

## 2013-11-27 DIAGNOSIS — M810 Age-related osteoporosis without current pathological fracture: Secondary | ICD-10-CM

## 2013-11-27 DIAGNOSIS — D500 Iron deficiency anemia secondary to blood loss (chronic): Secondary | ICD-10-CM

## 2013-11-27 DIAGNOSIS — D62 Acute posthemorrhagic anemia: Secondary | ICD-10-CM | POA: Diagnosis present

## 2013-11-27 DIAGNOSIS — C187 Malignant neoplasm of sigmoid colon: Secondary | ICD-10-CM

## 2013-11-27 DIAGNOSIS — Z8673 Personal history of transient ischemic attack (TIA), and cerebral infarction without residual deficits: Secondary | ICD-10-CM | POA: Diagnosis not present

## 2013-11-27 DIAGNOSIS — C7800 Secondary malignant neoplasm of unspecified lung: Secondary | ICD-10-CM | POA: Diagnosis present

## 2013-11-27 DIAGNOSIS — K922 Gastrointestinal hemorrhage, unspecified: Secondary | ICD-10-CM

## 2013-11-27 DIAGNOSIS — I5032 Chronic diastolic (congestive) heart failure: Secondary | ICD-10-CM | POA: Diagnosis present

## 2013-11-27 DIAGNOSIS — K589 Irritable bowel syndrome without diarrhea: Secondary | ICD-10-CM

## 2013-11-27 DIAGNOSIS — Z888 Allergy status to other drugs, medicaments and biological substances status: Secondary | ICD-10-CM

## 2013-11-27 DIAGNOSIS — Z23 Encounter for immunization: Secondary | ICD-10-CM

## 2013-11-27 DIAGNOSIS — R112 Nausea with vomiting, unspecified: Secondary | ICD-10-CM | POA: Diagnosis present

## 2013-11-27 DIAGNOSIS — I10 Essential (primary) hypertension: Secondary | ICD-10-CM | POA: Diagnosis present

## 2013-11-27 DIAGNOSIS — Z66 Do not resuscitate: Secondary | ICD-10-CM | POA: Diagnosis present

## 2013-11-27 DIAGNOSIS — Z8041 Family history of malignant neoplasm of ovary: Secondary | ICD-10-CM | POA: Diagnosis not present

## 2013-11-27 DIAGNOSIS — Z7901 Long term (current) use of anticoagulants: Secondary | ICD-10-CM

## 2013-11-27 DIAGNOSIS — I4891 Unspecified atrial fibrillation: Secondary | ICD-10-CM

## 2013-11-27 DIAGNOSIS — C19 Malignant neoplasm of rectosigmoid junction: Principal | ICD-10-CM | POA: Diagnosis present

## 2013-11-27 DIAGNOSIS — M129 Arthropathy, unspecified: Secondary | ICD-10-CM | POA: Diagnosis present

## 2013-11-27 DIAGNOSIS — I509 Heart failure, unspecified: Secondary | ICD-10-CM | POA: Diagnosis present

## 2013-11-27 DIAGNOSIS — I639 Cerebral infarction, unspecified: Secondary | ICD-10-CM

## 2013-11-27 DIAGNOSIS — I495 Sick sinus syndrome: Secondary | ICD-10-CM

## 2013-11-27 DIAGNOSIS — Z880 Allergy status to penicillin: Secondary | ICD-10-CM

## 2013-11-27 DIAGNOSIS — I482 Chronic atrial fibrillation, unspecified: Secondary | ICD-10-CM

## 2013-11-27 DIAGNOSIS — R609 Edema, unspecified: Secondary | ICD-10-CM

## 2013-11-27 DIAGNOSIS — R739 Hyperglycemia, unspecified: Secondary | ICD-10-CM

## 2013-11-27 DIAGNOSIS — I4892 Unspecified atrial flutter: Secondary | ICD-10-CM

## 2013-11-27 LAB — CBC WITH DIFFERENTIAL/PLATELET
BASOS ABS: 0 10*3/uL (ref 0.0–0.1)
Basophils Relative: 0 % (ref 0–1)
EOS PCT: 1 % (ref 0–5)
Eosinophils Absolute: 0.1 10*3/uL (ref 0.0–0.7)
HEMATOCRIT: 25 % — AB (ref 36.0–46.0)
Hemoglobin: 7.4 g/dL — ABNORMAL LOW (ref 12.0–15.0)
LYMPHS PCT: 19 % (ref 12–46)
Lymphs Abs: 1.4 10*3/uL (ref 0.7–4.0)
MCH: 23.3 pg — ABNORMAL LOW (ref 26.0–34.0)
MCHC: 29.6 g/dL — ABNORMAL LOW (ref 30.0–36.0)
MCV: 78.6 fL (ref 78.0–100.0)
MONO ABS: 0.7 10*3/uL (ref 0.1–1.0)
Monocytes Relative: 10 % (ref 3–12)
NEUTROS ABS: 5.1 10*3/uL (ref 1.7–7.7)
Neutrophils Relative %: 70 % (ref 43–77)
PLATELETS: 288 10*3/uL (ref 150–400)
RBC: 3.18 MIL/uL — ABNORMAL LOW (ref 3.87–5.11)
RDW: 15.7 % — AB (ref 11.5–15.5)
WBC: 7.2 10*3/uL (ref 4.0–10.5)

## 2013-11-27 LAB — COMPREHENSIVE METABOLIC PANEL
ALT: 11 U/L (ref 0–35)
AST: 20 U/L (ref 0–37)
Albumin: 3.5 g/dL (ref 3.5–5.2)
Alkaline Phosphatase: 71 U/L (ref 39–117)
Anion gap: 12 (ref 5–15)
BILIRUBIN TOTAL: 0.3 mg/dL (ref 0.3–1.2)
BUN: 18 mg/dL (ref 6–23)
CHLORIDE: 102 meq/L (ref 96–112)
CO2: 27 mEq/L (ref 19–32)
Calcium: 9.4 mg/dL (ref 8.4–10.5)
Creatinine, Ser: 0.85 mg/dL (ref 0.50–1.10)
GFR, EST AFRICAN AMERICAN: 68 mL/min — AB (ref >90)
GFR, EST NON AFRICAN AMERICAN: 59 mL/min — AB (ref >90)
GLUCOSE: 100 mg/dL — AB (ref 70–99)
Potassium: 4.8 mEq/L (ref 3.7–5.3)
SODIUM: 141 meq/L (ref 137–147)
Total Protein: 6.9 g/dL (ref 6.0–8.3)

## 2013-11-27 LAB — PROTIME-INR
INR: 1.43 (ref 0.00–1.49)
Prothrombin Time: 17.5 seconds — ABNORMAL HIGH (ref 11.6–15.2)

## 2013-11-27 LAB — OCCULT BLOOD X 1 CARD TO LAB, STOOL: Fecal Occult Bld: POSITIVE — AB

## 2013-11-27 LAB — POCT HEMOGLOBIN: Hemoglobin: 7.3 g/dL — AB (ref 12.2–16.2)

## 2013-11-27 MED ORDER — MAGNESIUM HYDROXIDE 400 MG/5ML PO SUSP
30.0000 mL | Freq: Every day | ORAL | Status: DC
  Filled 2013-11-27 (×5): qty 30

## 2013-11-27 MED ORDER — SOTALOL HCL 120 MG PO TABS
240.0000 mg | ORAL_TABLET | Freq: Two times a day (BID) | ORAL | Status: DC
  Administered 2013-11-28 – 2013-12-01 (×8): 240 mg via ORAL
  Filled 2013-11-27 (×9): qty 2

## 2013-11-27 MED ORDER — SODIUM CHLORIDE 0.9 % IV SOLN
INTRAVENOUS | Status: DC
  Administered 2013-11-27 – 2013-11-29 (×3): via INTRAVENOUS
  Administered 2013-11-29 (×2): 1000 mL via INTRAVENOUS

## 2013-11-27 MED ORDER — FERROUS SULFATE 325 (65 FE) MG PO TABS
325.0000 mg | ORAL_TABLET | Freq: Three times a day (TID) | ORAL | Status: DC
  Filled 2013-11-27 (×4): qty 1

## 2013-11-27 MED ORDER — INFLUENZA VAC SPLIT QUAD 0.5 ML IM SUSY
0.5000 mL | PREFILLED_SYRINGE | INTRAMUSCULAR | Status: AC
  Administered 2013-11-29: 0.5 mL via INTRAMUSCULAR
  Filled 2013-11-27: qty 0.5

## 2013-11-27 MED ORDER — PANTOPRAZOLE SODIUM 40 MG PO TBEC: 40.0000 mg | DELAYED_RELEASE_TABLET | Freq: Every day | ORAL | Status: DC

## 2013-11-27 MED ORDER — FAMOTIDINE 20 MG PO TABS
20.0000 mg | ORAL_TABLET | Freq: Two times a day (BID) | ORAL | Status: DC
  Administered 2013-11-28 – 2013-12-01 (×8): 20 mg via ORAL
  Filled 2013-11-27 (×9): qty 1

## 2013-11-27 MED ORDER — ONDANSETRON HCL 4 MG PO TABS: 4.0000 mg | ORAL_TABLET | Freq: Three times a day (TID) | ORAL | Status: DC | PRN

## 2013-11-27 MED ORDER — NITROFURANTOIN MONOHYD MACRO 100 MG PO CAPS
100.0000 mg | ORAL_CAPSULE | Freq: Two times a day (BID) | ORAL | Status: DC
  Administered 2013-11-28: 100 mg via ORAL
  Filled 2013-11-27 (×4): qty 1

## 2013-11-27 MED ORDER — SODIUM CHLORIDE 0.9 % IJ SOLN
3.0000 mL | Freq: Two times a day (BID) | INTRAMUSCULAR | Status: DC
  Administered 2013-11-28 – 2013-11-29 (×2): 3 mL via INTRAVENOUS

## 2013-11-27 MED ORDER — PANTOPRAZOLE SODIUM 40 MG IV SOLR
40.0000 mg | Freq: Once | INTRAVENOUS | Status: AC
  Administered 2013-11-27: 40 mg via INTRAVENOUS
  Filled 2013-11-27: qty 40

## 2013-11-27 MED ORDER — DILTIAZEM HCL ER COATED BEADS 240 MG PO CP24
240.0000 mg | ORAL_CAPSULE | Freq: Every day | ORAL | Status: DC
  Administered 2013-11-28 – 2013-12-01 (×5): 240 mg via ORAL
  Filled 2013-11-27 (×5): qty 1

## 2013-11-27 MED ORDER — RAMIPRIL 10 MG PO CAPS
10.0000 mg | ORAL_CAPSULE | Freq: Every day | ORAL | Status: DC
  Administered 2013-11-28 – 2013-12-01 (×5): 10 mg via ORAL
  Filled 2013-11-27 (×5): qty 1

## 2013-11-27 MED ORDER — PANTOPRAZOLE SODIUM 40 MG IV SOLR
40.0000 mg | Freq: Two times a day (BID) | INTRAVENOUS | Status: DC
  Administered 2013-11-28: 40 mg via INTRAVENOUS
  Filled 2013-11-27 (×3): qty 40

## 2013-11-27 NOTE — Progress Notes (Signed)
NURSING PROGRESS NOTE  [name] [id number] Admission Data: 11/27/2013 10:27 PM Attending Provider: Etta Quill, DO FMB:WGYKZ, Elenore Rota, MD Code Status: DNR  [name] is a 80 y.o. female patient admitted from ED:  -No acute distress noted.  -No complaints of shortness of breath.  -No complaints of chest pain.   Cardiac Monitoring: Box # [id number] in place. Cardiac monitor yields:normal sinus rhythm.  Blood pressure 149/60, pulse 58, temperature 98.5 F (36.9 C), temperature source Oral, resp. rate 16, height 5\' 9"  (1.753 m), weight 88.451 kg (195 lb), SpO2 94.00%.   IV Fluids:  IV in place, occlusive dsg intact without redness, IV cath antecubital right, condition patent and no redness normal saline.   Allergies:  Amiodarone; Diovan; Lasix; Penicillins; and Sulfa antibiotics  Past Medical History:   has a past medical history of Diastolic heart failure; Ovarian cancer; Colon cancer; Arthritis; History of Clostridium difficile infection; Pacemaker; History of cellulitis and abscess; Hypertension; Atrial fibrillation; Tachy-brady syndrome (2008); Osteoporosis; Shortness of breath; Bruises easily; GERD (gastroesophageal reflux disease); Constipation; and Anemia.  Past Surgical History:   has past surgical history that includes Abdominal hysterectomy; Oophorectomy; Colonoscopy (03/30/2011); Esophagogastroduodenoscopy (03/30/2011); Colon resection (04/03/2011); pacemaker placement (2008); Colonoscopy (12/24/2011); bilateral cataracts; battery changed in pacemaker; Laparoscopic right hemi colectomy (02/19/2012); Partial colectomy (02/19/2012); and Lysis of adhesion (02/19/2012).  Social History:   reports that she has never smoked. She has never used smokeless tobacco. She reports that she does not drink alcohol or use illicit drugs.  Skin: WDL  Patient/Family oriented to room. Information packet given to patient/family. Admission inpatient armband information verified with patient/family  to include name and date of birth and placed on patient arm. Side rails up x 2, fall assessment and education completed with patient/family. Patient/family able to verbalize understanding of risk associated with falls and verbalized understanding to call for assistance before getting out of bed. Call light within reach. Patient/family able to voice and demonstrate understanding of unit orientation instructions.

## 2013-11-27 NOTE — ED Notes (Addendum)
Pt reports ongoing abdominal pain, nausea, fatigue. Pt has been seen for same and is having decreasing hemoglobin levels- this morning hmg was 7.3. Denies dark tarry stools. Family sts she has not been eating or drinking well. Pt on antibiotics for bladder infection as well.

## 2013-11-27 NOTE — Progress Notes (Signed)
   Subjective:    Patient ID: [name], female    DOB: [birth year], 80 y.o.   MRN: [id number]  HPI Patient in today for recheck of anemia- She was 7.6 Friday of last week- IN for recheck today- She is fatigued and has been c/o burning sensation in gastric area.    Review of Systems  Constitutional: Positive for appetite change (decreased) and fatigue. Negative for fever.  Respiratory: Negative.   Cardiovascular: Negative.   Genitourinary: Negative.   Neurological: Negative.   Psychiatric/Behavioral: Negative.   All other systems reviewed and are negative.      Objective:   Physical Exam  Constitutional: She is oriented to person, place, and time. She appears well-developed and well-nourished.  Neurological: She is alert and oriented to person, place, and time.  Skin: Skin is warm and dry.  Slight paleness  Psychiatric: She has a normal mood and affect. Her behavior is normal. Judgment and thought content normal.   BP 150/62  Pulse 84  Temp(Src) 98.3 F (36.8 C) (Oral)  Ht 5\' 2"  (1.575 m)  Wt 197 lb (89.359 kg)  BMI 36.02 kg/m2  Results for orders placed in visit on 11/27/13  POCT HEMOGLOBIN      Result Value Ref Range   Hemoglobin 7.3 (*) 12.2 - 16.2 g/dL          Assessment & Plan:   1. Iron deficiency anemia due to chronic blood loss    Hgb still to low enough to send to hospital Trying to get in with GI today Continue iron tablets  Irvin-Margaret Hassell Done, FNP

## 2013-11-27 NOTE — ED Provider Notes (Signed)
CSN: [id number]     Arrival date & time 11/27/13  1543 History   First MD Initiated Contact with Patient 11/27/13 1844     Chief Complaint  Patient presents with  . Abdominal Pain     (Consider location/radiation/quality/duration/timing/severity/associated sxs/prior Treatment) Patient is a 80 y.o. female presenting with vomiting. The history is provided by the patient.  Emesis Severity:  Mild Duration:  1 week Timing:  Intermittent Quality:  Stomach contents Able to tolerate:  Liquids and solids Progression:  Unchanged Chronicity:  New Recent urination:  Normal Relieved by:  Nothing Worsened by:  Nothing tried Ineffective treatments:  None tried Associated symptoms: no abdominal pain and no headaches     Past Medical History  Diagnosis Date  . Diastolic heart failure   . Ovarian cancer   . Colon cancer   . Arthritis   . History of Clostridium difficile infection     in Jan 2013  . Pacemaker   . History of cellulitis and abscess   . Hypertension     takes Ramipril,Diltiazem daily  . Atrial fibrillation     takes Coumadin daily  . Tachy-brady syndrome 2008    s/p PPM implantation by Dr Oswald Hillock takes Betapace daily  . Osteoporosis     takes Prolia every 64months injections  . Shortness of breath     with exertion;Albuterol prn  . Bruises easily     takes COumadin daily  . GERD (gastroesophageal reflux disease)     takes Protonix daily  . Constipation     Milk of Mag nightly  . Anemia     takes Iron daily   Past Surgical History  Procedure Laterality Date  . Abdominal hysterectomy    . Oophorectomy    . Colonoscopy  03/30/2011    Procedure: COLONOSCOPY;  Surgeon: Missy Sabins, MD;  Location: [place];  Service: Endoscopy;  Laterality: N/A;  . Esophagogastroduodenoscopy  03/30/2011    Procedure: ESOPHAGOGASTRODUODENOSCOPY (EGD);  Surgeon: Missy Sabins, MD;  Location: [facility] ENDOSCOPY;  Service: Endoscopy;  Laterality: N/A;  . Colon resection  04/03/2011     Procedure: COLON RESECTION LAPAROSCOPIC;  Surgeon: Gayland Curry, MD;  Location: [place];  Service: General;  Laterality: N/A;  Laparoscopic, turned open sigmoid colon resection, lysis of adhesions x 2.5 hours, rigid proctoscopy.  . Pacemaker placement  2008    MDT EnRhythm implanted by Dr Leonia Reeves, generator change (MDT Adapta L) by Dr Rayann Heman 08/04/11  . Colonoscopy  12/24/2011    Procedure: COLONOSCOPY;  Surgeon: Rogene Houston, MD;  Location: [facility];  Service: Endoscopy;  Laterality: N/A;  . Bilateral cataracts    . Battery changed in pacemaker    . Laparoscopic right hemi colectomy  02/19/2012    Procedure: LAPAROSCOPIC RIGHT HEMI COLECTOMY;  Surgeon: Gayland Curry, MD,FACS;  Location: [place];  Service: General;  Laterality: Right;  attempted laparoscopic assisted right hemi colectomy converted to open.  . Partial colectomy  02/19/2012    Procedure: PARTIAL COLECTOMY;  Surgeon: Gayland Curry, MD,FACS;  Location: [place];  Service: General;  Laterality: Right;  . Lysis of adhesion  02/19/2012    Procedure: LYSIS OF ADHESION;  Surgeon: Gayland Curry, MD,FACS;  Location: [facility] OR;  Service: General;  Laterality: N/A;   Family History  Problem Relation Age of Onset  . Anesthesia problems Neg Hx   . Hypotension Neg Hx   . Malignant hyperthermia Neg Hx   . Pseudochol deficiency Neg Hx   .  Cancer Mother     ovarian   History  Substance Use Topics  . Smoking status: Never Smoker   . Smokeless tobacco: Never Used  . Alcohol Use: No   OB History   Grav Para Term Preterm Abortions TAB SAB Ect Mult Living                 Review of Systems  HENT: Negative for congestion and drooling.   Eyes: Negative for pain.  Gastrointestinal: Positive for vomiting. Negative for abdominal pain.  Genitourinary:       Dark stools  Musculoskeletal: Negative for back pain, gait problem and neck pain.  Skin: Negative for color change.  Neurological: Negative for dizziness and headaches.  Hematological:  Negative for adenopathy.  Psychiatric/Behavioral: Negative for behavioral problems.  All other systems reviewed and are negative.     Allergies  Amiodarone; Diovan; Lasix; Penicillins; and Sulfa antibiotics  Home Medications   Prior to Admission medications   Medication Sig Start Date End Date Taking? Authorizing Provider  apixaban (ELIQUIS) 5 MG TABS tablet Take 1 tablet (5 mg total) by mouth 2 (two) times daily. 09/18/13   [place]-Margaret Hassell Done, FNP  calcium-vitamin D (OSCAL WITH D) 500-200 MG-UNIT per tablet Take 1 tablet by mouth 2 (two) times daily.    Historical Provider, MD  diltiazem (CARDIZEM CD) 240 MG 24 hr capsule Take 240 mg by mouth daily.    Historical Provider, MD  famotidine (PEPCID) 20 MG tablet Take 20 mg by mouth 2 (two) times daily.    Historical Provider, MD  ferrous sulfate 325 (65 FE) MG tablet Take 1 tablet (325 mg total) by mouth 3 (three) times daily with meals. 11/24/13   Sharion Balloon, FNP  magnesium hydroxide (MILK OF MAGNESIA) 400 MG/5ML suspension Take 30 mLs by mouth at bedtime.     Historical Provider, MD  nitrofurantoin, macrocrystal-monohydrate, (MACROBID) 100 MG capsule  11/21/13   Historical Provider, MD  ondansetron (ZOFRAN) 4 MG tablet Take 1 tablet (4 mg total) by mouth every 8 (eight) hours as needed for nausea or vomiting. 11/21/13   Sharion Balloon, FNP  pantoprazole (PROTONIX) 40 MG tablet Take 1 tablet (40 mg total) by mouth daily. 09/18/13   Emmajo-Margaret Hassell Done, FNP  ramipril (ALTACE) 10 MG capsule Take 10 mg by mouth daily.    Historical Provider, MD  sotalol (BETAPACE) 120 MG tablet TAKE (2) TABLETS TWICE DAILY. 09/18/13   Nychelle-Margaret Hassell Done, FNP   BP 138/62  Pulse 59  Temp(Src) 98.5 F (36.9 C) (Oral)  Resp 18  Ht 5\' 9"  (1.753 m)  Wt 195 lb (88.451 kg)  BMI 28.78 kg/m2  SpO2 96% Physical Exam  Nursing note and vitals reviewed. Constitutional: She is oriented to person, place, and time. She appears well-developed and well-nourished.   HENT:  Head: Normocephalic.  Mouth/Throat: Oropharynx is clear and moist. No oropharyngeal exudate.  Eyes: Conjunctivae and EOM are normal. Pupils are equal, round, and reactive to light.  Neck: Normal range of motion. Neck supple.  Cardiovascular: Normal rate, regular rhythm, normal heart sounds and intact distal pulses.  Exam reveals no gallop and no friction rub.   No murmur heard. Pulmonary/Chest: Effort normal and breath sounds normal. No respiratory distress. She has no wheezes.  Abdominal: Soft. Bowel sounds are normal. There is no tenderness. There is no rebound and no guarding.  Genitourinary:  External rectum with nonthrombosed hemorrhoids.Maryjo Rochester appearing stool.  Musculoskeletal: Normal range of motion. She exhibits no edema and no  tenderness.  Neurological: She is alert and oriented to person, place, and time.  Skin: Skin is warm and dry.  Psychiatric: She has a normal mood and affect. Her behavior is normal.    ED Course  Procedures (including critical care time) Labs Review Labs Reviewed  CBC WITH DIFFERENTIAL - Abnormal; Notable for the following:    RBC 3.18 (*)    Hemoglobin 7.4 (*)    HCT 25.0 (*)    MCH 23.3 (*)    MCHC 29.6 (*)    RDW 15.7 (*)    All other components within normal limits  COMPREHENSIVE METABOLIC PANEL - Abnormal; Notable for the following:    Glucose, Bld 100 (*)    GFR calc non Af Amer 59 (*)    GFR calc Af Amer 68 (*)    All other components within normal limits  URINALYSIS, ROUTINE W REFLEX MICROSCOPIC  PROTIME-INR  OCCULT BLOOD X 1 CARD TO LAB, STOOL  TYPE AND SCREEN    Imaging Review No results found.   EKG Interpretation None      MDM   Final diagnoses:  Gastrointestinal hemorrhage, unspecified gastritis, unspecified gastrointestinal hemorrhage type    7:24 PM 80 y.o. female w hx of CHF, ovarian/colon cancer s/p resection of bowel/ovary, A fib on on eliquis s/p pacemaker who pw N/V. The patient has had intermittent  nausea and vomiting over the last week. She was seen by her PCP who diagnosed her with a UTI and she has been on Macrobid. She has ongoing symptoms but is currently asymptomatic. She is also had decreasing hemoglobin over the last week from 8.3 down to 7.3 today. She notes that she always has dark stools. She has dark stool on my exam as well. Screening labs sent prior to my evaluation. Will get Hemoccult. She denies any shortness of breath, chest pain, or abdominal pain. Family states emesis dark in appearance.   Will admit to hospitalist for further workup of gi bleed.   Pamella Pert, MD 11/27/13 2212

## 2013-11-27 NOTE — Progress Notes (Signed)
Called ED for report  

## 2013-11-27 NOTE — H&P (Signed)
[facility] History and Physical  [name] ATF:[id number] DOB: [birth year] DOA: 11/27/2013  Referring physician: EDP PCP: [name], MD   Chief Complaint: Nausea, vomiting   HPI: [name] is a 80 y.o. female h/o A.Fib with stroke in Dec now on Eliquis, who presents to the ED with nausea, vomiting.  Symptoms have been going on for the past week, intermittently.  Associated with dark stools (although they are always dark, likely due to chronic iron supplementation).  Vomit is dark in color, peptobismol has provided no relief.  Seen at PCPs office a week ago, noted to have new onset anemia with HGB 8.3 (was 12+ in July) referral done for [facility] at that time, HGB was 7.7 on Friday and 7.4 today in ED.  Hemoccult positive.  Review of Systems: Systems reviewed.  As above, otherwise negative  Past Medical History  Diagnosis Date  . Diastolic heart failure   . Ovarian cancer   . Colon cancer   . Arthritis   . History of Clostridium difficile infection     in Jan 2013  . Pacemaker   . History of cellulitis and abscess   . Hypertension     takes Ramipril,Diltiazem daily  . Atrial fibrillation     takes Coumadin daily  . Tachy-brady syndrome 2008    s/p PPM implantation by Dr Oswald Hillock takes Betapace daily  . Osteoporosis     takes Prolia every 34months injections  . Shortness of breath     with exertion;Albuterol prn  . Bruises easily     takes COumadin daily  . GERD (gastroesophageal reflux disease)     takes Protonix daily  . Constipation     Milk of Mag nightly  . Anemia     takes Iron daily   Past Surgical History  Procedure Laterality Date  . Abdominal hysterectomy    . Oophorectomy    . Colonoscopy  03/30/2011    Procedure: COLONOSCOPY;  Surgeon: Missy Sabins, MD;  Location: [place];  Service: Endoscopy;  Laterality: N/A;  . Esophagogastroduodenoscopy  03/30/2011    Procedure: ESOPHAGOGASTRODUODENOSCOPY (EGD);  Surgeon: Missy Sabins, MD;   Location: [facility] ENDOSCOPY;  Service: Endoscopy;  Laterality: N/A;  . Colon resection  04/03/2011    Procedure: COLON RESECTION LAPAROSCOPIC;  Surgeon: Gayland Curry, MD;  Location: [place];  Service: General;  Laterality: N/A;  Laparoscopic, turned open sigmoid colon resection, lysis of adhesions x 2.5 hours, rigid proctoscopy.  . Pacemaker placement  2008    MDT EnRhythm implanted by Dr Leonia Reeves, generator change (MDT Adapta L) by Dr Rayann Heman 08/04/11  . Colonoscopy  12/24/2011    Procedure: COLONOSCOPY;  Surgeon: Rogene Houston, MD;  [facility]: [facility];  Service: Endoscopy;  Laterality: N/A;  . Bilateral cataracts    . Battery changed in pacemaker    . Laparoscopic right hemi colectomy  02/19/2012    Procedure: LAPAROSCOPIC RIGHT HEMI COLECTOMY;  Surgeon: Gayland Curry, MD,FACS;  Location: [place];  Service: General;  Laterality: Right;  attempted laparoscopic assisted right hemi colectomy converted to open.  . Partial colectomy  02/19/2012    Procedure: PARTIAL COLECTOMY;  Surgeon: Gayland Curry, MD,FACS;  Location: [place];  Service: General;  Laterality: Right;  . Lysis of adhesion  02/19/2012    Procedure: LYSIS OF ADHESION;  Surgeon: Gayland Curry, MD,FACS;  Location: [place];  Service: General;  Laterality: N/A;   Social History:  reports that she has never  smoked. She has never used smokeless tobacco. She reports that she does not drink alcohol or use illicit drugs.  Allergies  Allergen Reactions  . Amiodarone Other (See Comments)    pacemaker  . Diovan [Valsartan] Other (See Comments)  . Lasix [Furosemide] Swelling  . Penicillins Nausea And Vomiting and Swelling    Lip swelling and redness  . Sulfa Antibiotics Rash    Family History  Problem Relation Age of Onset  . Anesthesia problems Neg Hx   . Hypotension Neg Hx   . Malignant hyperthermia Neg Hx   . Pseudochol deficiency Neg Hx   . Cancer Mother     ovarian     Prior to Admission medications   Medication Sig Start Date End  Date Taking? Authorizing Provider  apixaban (ELIQUIS) 5 MG TABS tablet Take 1 tablet (5 mg total) by mouth 2 (two) times daily. 09/18/13  Yes Gerardo-Margaret Hassell Done, FNP  calcium-vitamin D (OSCAL WITH D) 500-200 MG-UNIT per tablet Take 1 tablet by mouth 2 (two) times daily.   Yes Historical Provider, MD  diltiazem (CARDIZEM CD) 240 MG 24 hr capsule Take 240 mg by mouth daily.   Yes Historical Provider, MD  famotidine (PEPCID) 20 MG tablet Take 20 mg by mouth 2 (two) times daily.   Yes Historical Provider, MD  ferrous sulfate 325 (65 FE) MG tablet Take 1 tablet (325 mg total) by mouth 3 (three) times daily with meals. 11/24/13  Yes Sharion Balloon, FNP  magnesium hydroxide (MILK OF MAGNESIA) 400 MG/5ML suspension Take 30 mLs by mouth at bedtime.    Yes Historical Provider, MD  nitrofurantoin, macrocrystal-monohydrate, (MACROBID) 100 MG capsule Take 100 mg by mouth 2 (two) times daily.  11/21/13  Yes Historical Provider, MD  ondansetron (ZOFRAN) 4 MG tablet Take 1 tablet (4 mg total) by mouth every 8 (eight) hours as needed for nausea or vomiting. 11/21/13  Yes Sharion Balloon, FNP  pantoprazole (PROTONIX) 40 MG tablet Take 1 tablet (40 mg total) by mouth daily. 09/18/13  Yes Brya-Margaret Hassell Done, FNP  ramipril (ALTACE) 10 MG capsule Take 10 mg by mouth daily.   Yes Historical Provider, MD  sotalol (BETAPACE) 120 MG tablet Take 240 mg by mouth 2 (two) times daily.    Yes Historical Provider, MD   Physical Exam: Filed Vitals:   11/27/13 2015  BP: 149/60  Pulse: 58  Temp:   Resp: 16    BP 149/60  Pulse 58  Temp(Src) 98.5 F (36.9 C) (Oral)  Resp 16  Ht 5\' 9"  (1.753 m)  Wt 88.451 kg (195 lb)  BMI 28.78 kg/m2  SpO2 94%  General Appearance:    Alert, oriented, no distress, appears stated age  Head:    Normocephalic, atraumatic  Eyes:    PERRL, EOMI, sclera non-icteric        Nose:   Nares without drainage or epistaxis. Mucosa, turbinates normal  Throat:   Moist mucous membranes. Oropharynx without  erythema or exudate.  Neck:   Supple. No carotid bruits.  No thyromegaly.  No lymphadenopathy.   Back:     No CVA tenderness, no spinal tenderness  Lungs:     Clear to auscultation bilaterally, without wheezes, rhonchi or rales  Chest wall:    No tenderness to palpitation  Heart:    Regular rate and rhythm without murmurs, gallops, rubs  Abdomen:     Soft, non-tender, nondistended, normal bowel sounds, no organomegaly  Genitalia:    deferred  Rectal:  deferred  Extremities:   No clubbing, cyanosis or edema.  Pulses:   2+ and symmetric all extremities  Skin:   Skin color, texture, turgor normal, no rashes or lesions  Lymph nodes:   Cervical, supraclavicular, and axillary nodes normal  Neurologic:   CNII-XII intact. Normal strength, sensation and reflexes      throughout    Labs on Admission:  Basic Metabolic Panel:  Recent Labs Lab 11/21/13 1142 11/24/13 1429 11/27/13 1635  NA 145* 138 141  K 4.5 4.6 4.8  CL 102 98 102  CO2 26 27 27   GLUCOSE 130* 110* 100*  BUN 18 16 18   CREATININE 0.90 1.05* 0.85  CALCIUM 9.2 8.9 9.4   Liver Function Tests:  Recent Labs Lab 11/21/13 1142 11/24/13 1429 11/27/13 1635  AST 16 17 20   ALT 10 8 11   ALKPHOS 69 69 71  BILITOT 0.3 0.3 0.3  PROT 6.7 6.8 6.9  ALBUMIN  --   --  3.5   No results found for this basename: LIPASE, AMYLASE,  in the last 168 hours No results found for this basename: AMMONIA,  in the last 168 hours CBC:  Recent Labs Lab 11/21/13 1201 11/24/13 1428 11/27/13 0924 11/27/13 1635  WBC 11.3* 6.6  --  7.2  NEUTROABS  --   --   --  5.1  HGB 8.3* 7.7* 7.3* 7.4*  HCT 26.8* 25.0*  --  25.0*  MCV 76.7* 76.3*  --  78.6  PLT  --   --   --  288   Cardiac Enzymes: No results found for this basename: CKTOTAL, CKMB, CKMBINDEX, TROPONINI,  in the last 168 hours  BNP (last 3 results) No results found for this basename: PROBNP,  in the last 8760 hours CBG: No results found for this basename: GLUCAP,  in the last 168  hours  Radiological Exams on Admission: No results found.  EKG: Independently reviewed.  Assessment/Plan Principal Problem:   GIB (gastrointestinal bleeding) Active Problems:   Acute blood loss anemia   1. GIB - probably UGIB given the N/V with dark colored vomit over the past week, likely exacerbated by Eliquis treatment. 1. Hold Eliquis 2. GI consulted, they will see in AM 3. Water only until midnight then NPO after midnight for likely EGD tomorrow AM 4. Repeat CBC in AM, transfuse PRN for the acute blood loss anemia, GIB seems to be on the slow side with 1gm HGB drop over past week. 5. IV PPI Q12H 6. Tele monitor   Code Status: Full Code  Family Communication: Family at bedside Disposition Plan: Admit to inpatient   Time spent: 57 min  Jaykwon Morones M. [facility] Pager [phone]  If 7AM-7PM, please contact the day team taking care of the patient [email or web address] Password [email or web address] 11/27/2013, 8:55 PM

## 2013-11-27 NOTE — ED Notes (Signed)
MD at the bedside  

## 2013-11-27 NOTE — ED Notes (Signed)
Attempted to get urine. Patient unable to give sample at this time.

## 2013-11-28 ENCOUNTER — Encounter (HOSPITAL_COMMUNITY): Payer: Self-pay | Admitting: *Deleted

## 2013-11-28 ENCOUNTER — Encounter (HOSPITAL_COMMUNITY)
Admission: EM | Disposition: A | Discharge status: Home / Self Care | Payer: Self-pay | Source: Home / Self Care | Attending: Internal Medicine

## 2013-11-28 ENCOUNTER — Encounter: Payer: Medicare Other | Admitting: Physical Therapy

## 2013-11-28 DIAGNOSIS — I10 Essential (primary) hypertension: Secondary | ICD-10-CM

## 2013-11-28 HISTORY — PX: ESOPHAGOGASTRODUODENOSCOPY: SHX5428

## 2013-11-28 LAB — BASIC METABOLIC PANEL
ANION GAP: 10 (ref 5–15)
BUN: 17 mg/dL (ref 6–23)
CO2: 25 mEq/L (ref 19–32)
Calcium: 8.6 mg/dL (ref 8.4–10.5)
Chloride: 108 mEq/L (ref 96–112)
Creatinine, Ser: 0.92 mg/dL (ref 0.50–1.10)
GFR, EST AFRICAN AMERICAN: 62 mL/min — AB (ref >90)
GFR, EST NON AFRICAN AMERICAN: 54 mL/min — AB (ref >90)
Glucose, Bld: 92 mg/dL (ref 70–99)
POTASSIUM: 4.4 meq/L (ref 3.7–5.3)
SODIUM: 143 meq/L (ref 137–147)

## 2013-11-28 LAB — CBC
HCT: 22.7 % — ABNORMAL LOW (ref 36.0–46.0)
HCT: 30.6 % — ABNORMAL LOW (ref 36.0–46.0)
HEMOGLOBIN: 6.8 g/dL — AB (ref 12.0–15.0)
HEMOGLOBIN: 9.5 g/dL — AB (ref 12.0–15.0)
MCH: 23.5 pg — ABNORMAL LOW (ref 26.0–34.0)
MCH: 24.7 pg — ABNORMAL LOW (ref 26.0–34.0)
MCHC: 30 g/dL (ref 30.0–36.0)
MCHC: 31 g/dL (ref 30.0–36.0)
MCV: 78.5 fL (ref 78.0–100.0)
MCV: 79.5 fL (ref 78.0–100.0)
Platelets: 209 10*3/uL (ref 150–400)
Platelets: 262 10*3/uL (ref 150–400)
RBC: 2.89 MIL/uL — ABNORMAL LOW (ref 3.87–5.11)
RBC: 3.85 MIL/uL — ABNORMAL LOW (ref 3.87–5.11)
RDW: 15.7 % — ABNORMAL HIGH (ref 11.5–15.5)
RDW: 15.7 % — ABNORMAL HIGH (ref 11.5–15.5)
WBC: 5.4 10*3/uL (ref 4.0–10.5)
WBC: 6.6 10*3/uL (ref 4.0–10.5)

## 2013-11-28 LAB — PREPARE RBC (CROSSMATCH)

## 2013-11-28 SURGERY — EGD (ESOPHAGOGASTRODUODENOSCOPY)
Anesthesia: Moderate Sedation

## 2013-11-28 MED ORDER — PEG 3350-KCL-NA BICARB-NACL 420 G PO SOLR
4000.0000 mL | Freq: Once | ORAL | Status: AC
  Administered 2013-11-28: 4000 mL via ORAL
  Filled 2013-11-28: qty 4000

## 2013-11-28 MED ORDER — FENTANYL CITRATE 0.05 MG/ML IJ SOLN
INTRAMUSCULAR | Status: AC
  Filled 2013-11-28: qty 2

## 2013-11-28 MED ORDER — BUMETANIDE 0.25 MG/ML IJ SOLN
0.5000 mg | Freq: Once | INTRAMUSCULAR | Status: AC
  Administered 2013-11-28: 0.5 mg via INTRAVENOUS
  Filled 2013-11-28: qty 2

## 2013-11-28 MED ORDER — BUTAMBEN-TETRACAINE-BENZOCAINE 2-2-14 % EX AERO
INHALATION_SPRAY | CUTANEOUS | Status: DC | PRN
  Administered 2013-11-28: 2 via TOPICAL

## 2013-11-28 MED ORDER — SODIUM CHLORIDE 0.9 % IV SOLN: Freq: Once | INTRAVENOUS | Status: AC

## 2013-11-28 MED ORDER — DIPHENHYDRAMINE HCL 50 MG/ML IJ SOLN
INTRAMUSCULAR | Status: AC
  Filled 2013-11-28: qty 1

## 2013-11-28 MED ORDER — FUROSEMIDE 10 MG/ML IJ SOLN: 20.0000 mg | Freq: Once | INTRAMUSCULAR | Status: DC

## 2013-11-28 MED ORDER — POLYETHYLENE GLYCOL 3350 17 GM/SCOOP PO POWD
255.0000 g | Freq: Once | ORAL | Status: DC
  Filled 2013-11-28: qty 255

## 2013-11-28 MED ORDER — MIDAZOLAM HCL 5 MG/ML IJ SOLN
INTRAMUSCULAR | Status: AC
  Filled 2013-11-28: qty 2

## 2013-11-28 MED ORDER — POLYETHYLENE GLYCOL 3350 17 G PO PACK
17.0000 g | PACK | ORAL | Status: AC
  Administered 2013-11-29: 17 g via ORAL
  Filled 2013-11-28 (×3): qty 1

## 2013-11-28 MED ORDER — DIPHENHYDRAMINE HCL 50 MG/ML IJ SOLN
25.0000 mg | Freq: Once | INTRAMUSCULAR | Status: AC
  Administered 2013-11-28: 25 mg via INTRAVENOUS
  Filled 2013-11-28: qty 1

## 2013-11-28 MED ORDER — FENTANYL CITRATE 0.05 MG/ML IJ SOLN
INTRAMUSCULAR | Status: DC | PRN
  Administered 2013-11-28: 25 ug via INTRAVENOUS

## 2013-11-28 MED ORDER — ACETAMINOPHEN 325 MG PO TABS
650.0000 mg | ORAL_TABLET | Freq: Once | ORAL | Status: AC
  Administered 2013-11-28: 650 mg via ORAL
  Filled 2013-11-28: qty 2

## 2013-11-28 MED ORDER — MIDAZOLAM HCL 10 MG/2ML IJ SOLN
INTRAMUSCULAR | Status: DC | PRN
  Administered 2013-11-28 (×3): 1 mg via INTRAVENOUS

## 2013-11-28 NOTE — Op Note (Signed)
[facility] [place], [id number]   ENDOSCOPY PROCEDURE REPORT  PATIENT: Carla, [name]  MR#: [id number] BIRTHDATE: 01/31/1923 , 89  yrs. old GENDER: Female  ENDOSCOPIST: Clarene Essex, MD REFERRED BY:  PROCEDURE DATE:  11/28/2013 PROCEDURE:   EGD, diagnostic ASA CLASS:   Class III INDICATIONS:Anemia and Melena.  MEDICATIONS: Fentanyl 25 mcg IV and Versed 4 mg IV  TOPICAL ANESTHETIC:used  DESCRIPTION OF PROCEDURE:   After the risks benefits and alternatives of the procedure were thoroughly explained, informed consent was obtained.  The Pentax Gastroscope [id number]  endoscope was introduced through the mouth and advanced to the second portion of the duodenum , limited by Without limitations.   The instrument was slowly withdrawn as the mucosa was fully examined.there is no signs of bleeding and  the patient tolerated the procedure well there is no obvious immediate complication and the findings are recorded below         FINDINGS:1. Small hiatal hernia 2. Otherwise within normal limits EGD without any signs of bleeding  COMPLICATIONS:none  ENDOSCOPIC IMPRESSION: above   RECOMMENDATIONS:proceed with colonoscopy tomorrow at 1:30 by my partner Dr. Jacinto Reap. and the procedure was discussed with the patient and her son  REPEAT EXAM: as needed   _______________________________ Clarene Essex, MD eSigned:  Clarene Essex, MD 11/28/2013 10:55 AM    CC:  PATIENT NAME:  Carla, [name] MR#: [id number]

## 2013-11-28 NOTE — Progress Notes (Signed)
PROGRESS NOTE  [name] OVZ:[id number] DOB: February 02, 1923 DOA: 11/27/2013 PCP: [name], MD  HPI/Subjective: [name] is an 80 y.o. Female with a history of AFib, with stroke in December, now on Eliquis, and colon cancer. She was sent to the ED by PCP with decreasing hemoglobin and one week intermittent nausea and vomiting, with dark colored stools and vomit. Patient reports she is on iron therapy and takes her iron as directed.   She does not complain of any nausea at this time and has not vomited since her admission. Her last bowel movement was 9/14 before admission. Only complaint is sinus congestion that has developed over the past 24 hours.  Assessment/Plan: GI Bleed with acute blood loss anemia - Admitted, kept n.p.o., Eliquis on hold for now, GI consulted, EGD on 9/15 revealed no bleeding foci, plans are for colonoscopy on 9/16 - Continue PPI, significant drop in hemoglobin, we'll transfuse 2 units of PRBC today.  Hypertension -stable at this time - Cautiously continue Diltiazem, ramipril  Atrial Fibrillation - Remains in sinus rhythm -hold Eliquis at this time, continue sotalol and Cardizem  Recent UTI - Stop  Macrodantin, no evidence of UTI at this time. Seems to have already completed 5 days of treatment.  History of colon cancer - Status post colectomy- given a recurrence of anemia/blood loss anemia/GI bleed-plans for repeat colonoscopy this admission   History of CVA - Nonfocal exam. - Eliquis on hold given ongoing GI bleeding  DVT Prophylaxis:  SCDs   Code Status: DNR Family Communication: patient alert and oriented Disposition Plan: remain inpatient at this time   Consultants:  GI  Procedures:  EGD   Antibiotics: None  Objective: Filed Vitals:   11/28/13 1035 11/28/13 1040 11/28/13 1045 11/28/13 1054  BP: 134/59 130/57 144/60 114/51  Pulse: 81 64 60 59  Temp:      TempSrc:      Resp: 17 17 20 17   Height:      Weight:      SpO2: 100%  100% 100% 100%    Intake/Output Summary (Last 24 hours) at 11/28/13 1100 Last data filed at 11/28/13 0945  Gross per 24 hour  Intake   12.5 ml  Output    300 ml  Net -287.5 ml   Filed Weights   11/27/13 1626 11/27/13 2300  Weight: 88.451 kg (195 lb) 87.4 kg (192 lb 10.9 oz)    Exam: General: Well developed, well nourished, in NAD Cardiovascular: RRR, no rubs, murmurs or gallops.   Respiratory: Clear to auscultation bilaterally with equal chest rise, no wheezes, rales, or rhonchi Abdomen: Soft, nontender, nondistended     Data Reviewed: Basic Metabolic Panel:  Recent Labs Lab 11/21/13 1142 11/24/13 1429 11/27/13 1635 11/28/13 0623  NA 145* 138 141 143  K 4.5 4.6 4.8 4.4  CL 102 98 102 108  CO2 26 27 27 25   GLUCOSE 130* 110* 100* 92  BUN 18 16 18 17   CREATININE 0.90 1.05* 0.85 0.92  CALCIUM 9.2 8.9 9.4 8.6   Liver Function Tests:  Recent Labs Lab 11/21/13 1142 11/24/13 1429 11/27/13 1635  AST 16 17 20   ALT 10 8 11   ALKPHOS 69 69 71  BILITOT 0.3 0.3 0.3  PROT 6.7 6.8 6.9  ALBUMIN  --   --  3.5   CBC:  Recent Labs Lab 11/21/13 1201 11/24/13 1428 11/27/13 0924 11/27/13 1635 11/28/13 0623  WBC 11.3* 6.6  --  7.2 5.4  NEUTROABS  --   --   --  5.1  --   HGB 8.3* 7.7* 7.3* 7.4* 6.8*  HCT 26.8* 25.0*  --  25.0* 22.7*  MCV 76.7* 76.3*  --  78.6 78.5  PLT  --   --   --  288 209    Recent Results (from the past 240 hour(s))  URINE CULTURE     Status: None   Collection Time    11/21/13  1:38 PM      Result Value Ref Range Status   Urine Culture, Routine Final report   Final   Result 1 Comment   Final   Comment: Mixed urogenital flora     Greater than 100,000 colony forming units per mL     Studies: No results found.  Scheduled Meds: . bumetanide (BUMEX) IV  0.5 mg Intravenous Once  . diltiazem  240 mg Oral Daily  . famotidine  20 mg Oral BID  . ferrous sulfate  325 mg Oral TID WC  . Influenza vac split quadrivalent PF  0.5 mL Intramuscular  Tomorrow-1000  . magnesium hydroxide  30 mL Oral QHS  . nitrofurantoin (macrocrystal-monohydrate)  100 mg Oral BID  . pantoprazole (PROTONIX) IV  40 mg Intravenous Q12H  . ramipril  10 mg Oral Daily  . sodium chloride  3 mL Intravenous Q12H  . sotalol  240 mg Oral BID   Continuous Infusions: . sodium chloride 75 mL/hr at 11/27/13 2215    Principal Problem:   GIB (gastrointestinal bleeding) Active Problems:   Acute blood loss anemia   Hypertension   Atrial fibrillation   Carla Rocher, PA-S2   [facility] Pager [phone]. If 7PM-7AM, please contact night-coverage at [email or web address], password [facility] 11/28/2013, 11:00 AM  LOS: 1 day   AttendingPatient was seen, examined,treatment plan was discussed with the Physician extender. I have directly reviewed the clinical findings, lab, imaging studies and management of this patient in detail. I have made the necessary changes to the above noted documentation, and agree with the documentation, as recorded by the Physician extender.  Nena Alexander MD [facility].

## 2013-11-28 NOTE — Consult Note (Signed)
Reason for Consult: Guaiac positive anemia in patient on blood thinner Referring Physician: Hospital team  [name] is an 80 y.o. female.  HPI: Patient with a recent complicated GI history and her hospital computer and multiple procedures were reviewed and she had been doing well since her last surgery and pacemaker until last week when she had some vague abdominal discomfort and some nausea vomiting which lasted about a day and she did take some Pepto-Bismol and felt better after vomiting but came to the hospital this week with increased weakness and shortness of breath and was found to be guaiac positive and anemic and she is on iron as well as a blood thinner and we were consulted for further workup and plans and she's not on any extra blood thinners or aspirin or nonsteroidals at home and she had no problems with her previous colonoscopies and endoscopies which were reviewed and her family history is negative for any colon cancer and her case was discussed with her self and her son Past Medical History  Diagnosis Date  . Diastolic heart failure   . Ovarian cancer   . Colon cancer   . Arthritis   . History of Clostridium difficile infection     in Jan 2013  . Pacemaker   . History of cellulitis and abscess   . Hypertension     takes Ramipril,Diltiazem daily  . Atrial fibrillation     takes Coumadin daily  . Tachy-brady syndrome 2008    s/p PPM implantation by Dr Oswald Hillock takes Betapace daily  . Osteoporosis     takes Prolia every 20month injections  . Shortness of breath     with exertion;Albuterol prn  . Bruises easily     takes COumadin daily  . GERD (gastroesophageal reflux disease)     takes Protonix daily  . Constipation     Milk of Mag nightly  . Anemia     takes Iron daily    Past Surgical History  Procedure Laterality Date  . Abdominal hysterectomy    . Oophorectomy    . Colonoscopy  03/30/2011    Procedure: COLONOSCOPY;  Surgeon: JMissy Sabins MD;  [facility]  Service: Endoscopy;  Laterality: N/A;  . Esophagogastroduodenoscopy  03/30/2011    Procedure: ESOPHAGOGASTRODUODENOSCOPY (EGD);  Surgeon: JMissy Sabins MD;  [facility]  Service: Endoscopy;  Laterality: N/A;  . Colon resection  04/03/2011    Procedure: COLON RESECTION LAPAROSCOPIC;  Surgeon: EGayland Curry MD;  [facility] [facility]  Service: General;  Laterality: N/A;  Laparoscopic, turned open sigmoid colon resection, lysis of adhesions x 2.5 hours, rigid proctoscopy.  . Pacemaker placement  2008    MDT EnRhythm implanted by Dr ELeonia Reeves generator change (MDT Adapta L) by Dr ARayann Heman5/21/13  . Colonoscopy  12/24/2011    Procedure: COLONOSCOPY;  Surgeon: NRogene Houston MD;  [facility];  Service: Endoscopy;  Laterality: N/A;  . Bilateral cataracts    . Battery changed in pacemaker    . Laparoscopic right hemi colectomy  02/19/2012    Procedure: LAPAROSCOPIC RIGHT HEMI COLECTOMY;  Surgeon: EGayland Curry MD,FACS;  [place] [facility]  Service: General;  Laterality: Right;  attempted laparoscopic assisted right hemi colectomy converted to open.  . Partial colectomy  02/19/2012    Procedure: PARTIAL COLECTOMY;  Surgeon: EGayland Curry MD,FACS;  [facility] [facility]  Service: General;  Laterality: Right;  . Lysis of adhesion  02/19/2012    Procedure:  LYSIS OF ADHESION;  Surgeon: Gayland Curry, MD,FACS;  [facility];  Service: General;  Laterality: N/A;    Family History  Problem Relation Age of Onset  . Anesthesia problems Neg Hx   . Hypotension Neg Hx   . Malignant hyperthermia Neg Hx   . Pseudochol deficiency Neg Hx   . Cancer Mother     ovarian    Social History:  reports that she has never smoked. She has never used smokeless tobacco. She reports that she does not drink alcohol or use illicit drugs.  Allergies:  Allergies  Allergen Reactions  . Amiodarone Other (See Comments)    pacemaker  . Diovan [Valsartan] Other (See Comments)  . Lasix [Furosemide]  Swelling  . Penicillins Nausea And Vomiting and Swelling    Lip swelling and redness  . Sulfa Antibiotics Rash    Medications: I have reviewed the patient's current medications.  Results for orders placed during the hospital encounter of 11/27/13 (from the past 48 hour(s))  CBC WITH DIFFERENTIAL     Status: Abnormal   Collection Time    11/27/13  4:35 PM      Result Value Ref Range   WBC 7.2  4.0 - 10.5 K/uL   RBC 3.18 (*) 3.87 - 5.11 MIL/uL   Hemoglobin 7.4 (*) 12.0 - 15.0 g/dL   HCT 25.0 (*) 36.0 - 46.0 %   MCV 78.6  78.0 - 100.0 fL   MCH 23.3 (*) 26.0 - 34.0 pg   MCHC 29.6 (*) 30.0 - 36.0 g/dL   RDW 15.7 (*) 11.5 - 15.5 %   Platelets 288  150 - 400 K/uL   Neutrophils Relative % 70  43 - 77 %   Neutro Abs 5.1  1.7 - 7.7 K/uL   Lymphocytes Relative 19  12 - 46 %   Lymphs Abs 1.4  0.7 - 4.0 K/uL   Monocytes Relative 10  3 - 12 %   Monocytes Absolute 0.7  0.1 - 1.0 K/uL   Eosinophils Relative 1  0 - 5 %   Eosinophils Absolute 0.1  0.0 - 0.7 K/uL   Basophils Relative 0  0 - 1 %   Basophils Absolute 0.0  0.0 - 0.1 K/uL  COMPREHENSIVE METABOLIC PANEL     Status: Abnormal   Collection Time    11/27/13  4:35 PM      Result Value Ref Range   Sodium 141  137 - 147 mEq/L   Potassium 4.8  3.7 - 5.3 mEq/L   Chloride 102  96 - 112 mEq/L   CO2 27  19 - 32 mEq/L   Glucose, Bld 100 (*) 70 - 99 mg/dL   BUN 18  6 - 23 mg/dL   Creatinine, Ser 0.85  0.50 - 1.10 mg/dL   Calcium 9.4  8.4 - 10.5 mg/dL   Total Protein 6.9  6.0 - 8.3 g/dL   Albumin 3.5  3.5 - 5.2 g/dL   AST 20  0 - 37 U/L   ALT 11  0 - 35 U/L   Alkaline Phosphatase 71  39 - 117 U/L   Total Bilirubin 0.3  0.3 - 1.2 mg/dL   GFR calc non Af Amer 59 (*) >90 mL/min   GFR calc Af Amer 68 (*) >90 mL/min   Comment: (NOTE)     The eGFR has been calculated using the CKD EPI equation.     This calculation has not been validated in all clinical situations.  eGFR's persistently <90 mL/min signify possible Chronic Kidney      Disease.   Anion gap 12  5 - 15  OCCULT BLOOD X 1 CARD TO LAB, STOOL     Status: Abnormal   Collection Time    11/27/13  7:25 PM      Result Value Ref Range   Fecal Occult Bld POSITIVE (*) NEGATIVE  PROTIME-INR     Status: Abnormal   Collection Time    11/27/13  7:58 PM      Result Value Ref Range   Prothrombin Time 17.5 (*) 11.6 - 15.2 seconds   INR 1.43  0.00 - 1.49  TYPE AND SCREEN     Status: None   Collection Time    11/27/13  7:58 PM      Result Value Ref Range   ABO/RH(D) O POS     Antibody Screen NEG     Sample Expiration 11/30/2013     Unit Number [id number]     Blood Component Type RBC LR PHER2     Unit division 00     Status of Unit ISSUED     Transfusion Status OK TO TRANSFUSE     Crossmatch Result Compatible     Unit Number [id number]     Blood Component Type RED CELLS,LR     Unit division 00     Status of Unit ALLOCATED     Transfusion Status OK TO TRANSFUSE     Crossmatch Result Compatible    CBC     Status: Abnormal   Collection Time    11/28/13  6:23 AM      Result Value Ref Range   WBC 5.4  4.0 - 10.5 K/uL   RBC 2.89 (*) 3.87 - 5.11 MIL/uL   Hemoglobin 6.8 (*) 12.0 - 15.0 g/dL   Comment: CRITICAL RESULT CALLED TO, READ BACK BY AND VERIFIED WITH:     CONLEY,A RN 11/28/2013 0712 JORDANS     REPEATED TO VERIFY     SPECIMEN CHECKED FOR CLOTS   HCT 22.7 (*) 36.0 - 46.0 %   MCV 78.5  78.0 - 100.0 fL   MCH 23.5 (*) 26.0 - 34.0 pg   MCHC 30.0  30.0 - 36.0 g/dL   RDW 15.7 (*) 11.5 - 15.5 %   Platelets 209  150 - 400 K/uL   Comment: DELTA CHECK NOTED     REPEATED TO VERIFY     SPECIMEN CHECKED FOR CLOTS  BASIC METABOLIC PANEL     Status: Abnormal   Collection Time    11/28/13  6:23 AM      Result Value Ref Range   Sodium 143  137 - 147 mEq/L   Potassium 4.4  3.7 - 5.3 mEq/L   Chloride 108  96 - 112 mEq/L   CO2 25  19 - 32 mEq/L   Glucose, Bld 92  70 - 99 mg/dL   BUN 17  6 - 23 mg/dL   Creatinine, Ser 0.92  0.50 - 1.10 mg/dL   Calcium 8.6   8.4 - 10.5 mg/dL   GFR calc non Af Amer 54 (*) >90 mL/min   GFR calc Af Amer 62 (*) >90 mL/min   Comment: (NOTE)     The eGFR has been calculated using the CKD EPI equation.     This calculation has not been validated in all clinical situations.     eGFR's persistently <90 mL/min signify possible Chronic Kidney     Disease.  Anion gap 10  5 - 15  PREPARE RBC (CROSSMATCH)     Status: None   Collection Time    11/28/13  7:24 AM      Result Value Ref Range   Order Confirmation ORDER PROCESSED BY BLOOD BANK      No results found.  ROS she does have chronic constipation and takes milk of magnesia every night and has never tried MiraLax Blood pressure 124/60, pulse 63, temperature 98.6 F (37 C), temperature source Oral, resp. rate 18, height _0  (1.676 m), weight 87.4 kg (192 lb 10.9 oz), SpO2 98.00%. Physical Exam vital signs stable afebrile no acute distress exam please see pre-assessment evaluation labs reviewed as well as previous CT a few years ago and her previous 2 colonoscopies and endoscopy in 2013  Assessment/Plan: Guaiac positive anemia in patient on blood thinners with history of 2 colon cancers in 2013 Plan: We discussed a repeat endoscopy with her and her son and will proceed later today and consider either a CAT scan or repeat colonoscopy pending those findings and agree with transfusion in the meantime  [facility] E 11/28/2013, 10:31 AM

## 2013-11-29 ENCOUNTER — Encounter (HOSPITAL_COMMUNITY)
Admission: EM | Disposition: A | Discharge status: Home / Self Care | Payer: Self-pay | Source: Home / Self Care | Attending: Internal Medicine

## 2013-11-29 ENCOUNTER — Encounter (HOSPITAL_COMMUNITY): Payer: Self-pay | Admitting: Gastroenterology

## 2013-11-29 DIAGNOSIS — C19 Malignant neoplasm of rectosigmoid junction: Secondary | ICD-10-CM | POA: Diagnosis not present

## 2013-11-29 HISTORY — PX: COLONOSCOPY WITH PROPOFOL: SHX5780

## 2013-11-29 LAB — CBC
HCT: 28.9 % — ABNORMAL LOW (ref 36.0–46.0)
Hemoglobin: 9 g/dL — ABNORMAL LOW (ref 12.0–15.0)
MCH: 24.7 pg — ABNORMAL LOW (ref 26.0–34.0)
MCHC: 31.1 g/dL (ref 30.0–36.0)
MCV: 79.2 fL (ref 78.0–100.0)
PLATELETS: 224 10*3/uL (ref 150–400)
RBC: 3.65 MIL/uL — ABNORMAL LOW (ref 3.87–5.11)
RDW: 15.8 % — ABNORMAL HIGH (ref 11.5–15.5)
WBC: 5.7 10*3/uL (ref 4.0–10.5)

## 2013-11-29 LAB — BASIC METABOLIC PANEL
ANION GAP: 11 (ref 5–15)
BUN: 12 mg/dL (ref 6–23)
CHLORIDE: 106 meq/L (ref 96–112)
CO2: 25 meq/L (ref 19–32)
Calcium: 8.7 mg/dL (ref 8.4–10.5)
Creatinine, Ser: 0.89 mg/dL (ref 0.50–1.10)
GFR calc Af Amer: 65 mL/min — ABNORMAL LOW (ref >90)
GFR calc non Af Amer: 56 mL/min — ABNORMAL LOW (ref >90)
Glucose, Bld: 84 mg/dL (ref 70–99)
POTASSIUM: 3.8 meq/L (ref 3.7–5.3)
SODIUM: 142 meq/L (ref 137–147)

## 2013-11-29 LAB — TYPE AND SCREEN
ABO/RH(D): O POS
ANTIBODY SCREEN: NEGATIVE
UNIT DIVISION: 0
Unit division: 0

## 2013-11-29 SURGERY — COLONOSCOPY
Anesthesia: Moderate Sedation

## 2013-11-29 SURGERY — COLONOSCOPY WITH PROPOFOL
Anesthesia: Moderate Sedation

## 2013-11-29 MED ORDER — SODIUM CHLORIDE 0.9 % IV SOLN: INTRAVENOUS | Status: DC

## 2013-11-29 MED ORDER — FENTANYL CITRATE 0.05 MG/ML IJ SOLN
INTRAMUSCULAR | Status: AC
  Filled 2013-11-29: qty 2

## 2013-11-29 MED ORDER — PANTOPRAZOLE SODIUM 40 MG PO TBEC
40.0000 mg | DELAYED_RELEASE_TABLET | Freq: Every day | ORAL | Status: DC
  Administered 2013-11-29 – 2013-12-01 (×3): 40 mg via ORAL
  Filled 2013-11-29 (×3): qty 1

## 2013-11-29 MED ORDER — MIDAZOLAM HCL 10 MG/2ML IJ SOLN
INTRAMUSCULAR | Status: DC | PRN
  Administered 2013-11-29 (×2): 2 mg via INTRAVENOUS

## 2013-11-29 MED ORDER — FENTANYL CITRATE 0.05 MG/ML IJ SOLN
INTRAMUSCULAR | Status: DC | PRN
  Administered 2013-11-29: 25 ug via INTRAVENOUS

## 2013-11-29 MED ORDER — MIDAZOLAM HCL 5 MG/ML IJ SOLN
INTRAMUSCULAR | Status: AC
  Filled 2013-11-29: qty 2

## 2013-11-29 NOTE — Progress Notes (Signed)
Pt encouraged to drink some more prep this AM and pt refused stating "I will drink some later, my procedure isn't until 1pm." Pt informed that MD wants pt to drink 1 L of prep this morning starting at 0600 along with miralax. RN attempted to give pt scheduled miralax and pt states "I cannot drink this right now. I think it will make me sick." Will continue to monitor and encourage intake of prep.

## 2013-11-29 NOTE — Progress Notes (Signed)
Patient's colonoscopy shows a friable mass near the ileocolonic anastomosis, probably accounting for her anemia and heme-positive stool. I suspect it is a recurrent cancer. The patient and her family are aware.  The biopsies will hopefully be ready tomorrow morning. I will keep her on a clear liquid diet in the meantime, in case a decision is made to proceed with surgery on this admission.  Cleotis Nipper, M.D. [phone]

## 2013-11-29 NOTE — Progress Notes (Signed)
Pt encouraged to drink golytely prep for colonoscopy today. Pt took one sip and refused. Will continue to encourage fluid intake.

## 2013-11-29 NOTE — Progress Notes (Signed)
PROGRESS NOTE  JULIEANN DRUMMONDS VPX:[id number] DOB: 1922-07-12 DOA: 11/27/2013 PCP: [name], MD  HPI/Subjective: [name] is an 80 y.o. Female with a history of AFib, with stroke in December, now on Eliquis, and colon cancer. She was sent to the ED by PCP with decreasing hemoglobin and one week intermittent nausea and vomiting, with dark colored stools and vomit. She has no complaints at this time and denies nausea/vomiting. Patient reports several bowel movements since beginning her bowel prep for colonoscopy. Stool appeared dark, but she reports this is typical for her.  Assessment/Plan: GI Bleed with acute blood loss anemia  - Admitted, kept n.p.o., Eliquis on hold for now, GI consulted, EGD on 9/15 revealed no bleeding foci, plans are for colonoscopy on 9/16  - Continue PPI,transfused 2 units of PRBC 9/15. Hb stable at 9.0 today. Recheck in am. -Will continue to monitor Hgb.  Hypertension  -stable at this time  - Cautiously continue Diltiazem, ramipril   Atrial Fibrillation  - Remains in sinus rhythm  -hold Eliquis at this time, continue sotalol and Cardizem   Recent UTI  - Stop Macrodantin, no evidence of UTI at this time. Seems to have already completed 5 days of treatment.   History of colon cancer  - Status post colectomy- given a recurrence of anemia/blood loss anemia/GI bleed-plans for repeat colonoscopy this admission   History of CVA  - Nonfocal exam.  - Eliquis on hold given ongoing GI bleeding  DVT Prophylaxis:  SCDs  Code Status:DNR Family Communication: patient alert and oriented Disposition Plan:remain inpatient at this time   Consultants:  GI  Procedures:  EGD  Colonoscopy scheduled 9/16  Antibiotics:  None  Objective: Filed Vitals:   11/28/13 1454 11/28/13 1530 11/28/13 2313 11/29/13 0636  BP: 127/59 108/60 125/52 128/60  Pulse: 62 60 61 60  Temp: 98.6 F (37 C) 98.7 F (37.1 C) 98 F (36.7 C) 98.6 F (37 C)  TempSrc: Oral Oral  Oral Oral  Resp: 18 18 20 20   Height:      Weight:      SpO2:   97% 97%    Intake/Output Summary (Last 24 hours) at 11/29/13 1030 Last data filed at 11/28/13 1806  Gross per 24 hour  Intake  29.17 ml  Output    350 ml  Net -320.83 ml   Filed Weights   11/27/13 1626 11/27/13 2300  Weight: 88.451 kg (195 lb) 87.4 kg (192 lb 10.9 oz)    Exam: General: Well developed, well nourished, lying in bed with NAD Cardiovascular: RRR, with no rubs, murmurs or gallops.   Respiratory: Clear to auscultation bilaterally with equal chest rise, no rales, rhonchi or wheezes  Abdomen: Soft, nontender, nondistended Psych: Normal affect and demeanor   Data Reviewed: Basic Metabolic Panel:  Recent Labs Lab 11/24/13 1429 11/27/13 1635 11/28/13 0623 11/29/13 0432  NA 138 141 143 142  K 4.6 4.8 4.4 3.8  CL 98 102 108 106  CO2 27 27 25 25   GLUCOSE 110* 100* 92 84  BUN 16 18 17 12   CREATININE 1.05* 0.85 0.92 0.89  CALCIUM 8.9 9.4 8.6 8.7   Liver Function Tests:  Recent Labs Lab 11/24/13 1429 11/27/13 1635  AST 17 20  ALT 8 11  ALKPHOS 69 71  BILITOT 0.3 0.3  PROT 6.8 6.9  ALBUMIN  --  3.5   CBC:  Recent Labs Lab 11/24/13 1428 11/27/13 0924 11/27/13 1635 11/28/13 0623 11/28/13 1810 11/29/13 0432  WBC 6.6  --  7.2 5.4 6.6 5.7  NEUTROABS  --   --  5.1  --   --   --   HGB 7.7* 7.3* 7.4* 6.8* 9.5* 9.0*  HCT 25.0*  --  25.0* 22.7* 30.6* 28.9*  MCV 76.3*  --  78.6 78.5 79.5 79.2  PLT  --   --  288 209 262 224     Recent Results (from the past 240 hour(s))  URINE CULTURE     Status: None   Collection Time    11/21/13  1:38 PM      Result Value Ref Range Status   Urine Culture, Routine Final report   Final   Result 1 Comment   Final   Comment: Mixed urogenital flora     Greater than 100,000 colony forming units per mL     Studies: No results found.  Scheduled Meds: . diltiazem  240 mg Oral Daily  . famotidine  20 mg Oral BID  . magnesium hydroxide  30 mL Oral  QHS  . ramipril  10 mg Oral Daily  . sodium chloride  3 mL Intravenous Q12H  . sotalol  240 mg Oral BID   Continuous Infusions: . sodium chloride 1,000 mL (11/29/13 0949)    Principal Problem:   GIB (gastrointestinal bleeding) Active Problems:   Acute blood loss anemia   Hypertension   Atrial fibrillation  Waylan Rocher, PA-S2    [facility] Pager [phone]. If 7PM-7AM, please contact night-coverage at [email or web address], password [facility] 11/29/2013, 10:30 AM  LOS: 2 days   Attending Patient was seen, examined,treatment plan was discussed with the Physician extender. I have directly reviewed the clinical findings, lab, imaging studies and management of this patient in detail. I have made the necessary changes to the above noted documentation, and agree with the documentation, as recorded by the Physician extender.  Nena Alexander MD [facility].

## 2013-11-29 NOTE — Op Note (Signed)
[facility] [place], [id number]   OPERATIVE PROCEDURE REPORT  PATIENT: [name], [name]  MR#: [id number] BIRTHDATE: [birth year]  GENDER: Female ENDOSCOPIST: Ronald Lobo, MD ASSISTANT:   Mariana Single, RN Corliss Parish, technician PROCEDURE DATE: 11/29/2013 PROCEDURE: ASA CLASS:   Class III INDICATIONS:heme positive stool and anemia in a patient who has had previous resection of a sigmoid cancer and a proximal colonic cancer within the past couple of years MEDICATIONS: fentanyl 25 mcg, Versed 4 mg IV  DESCRIPTION OF PROCEDURE:   After the risks benefits and alternatives of the procedure were thoroughly explained, informed consent was obtained.  digital exam showed hemorrhoids.       The Pentax Ped Colon [id number]  endoscope was introduced through the anus and advanced to the ileocolonic anastomosis      , No adverse events experienced.    The quality of the prep was good      .  The instrument was then slowly withdrawn as the colon was fully examined.   the main finding on this examination was a friable mass adjacent to the ileocolonic anastomosis, nonobstructing, occupying approximately 1/3 of the circumference of the colon in that location. It had the appearance of a malignant neoplasm. Multiple biopsies were obtained.  This was otherwise a normal examination. No other lesions were seen. I did not see polyps, colitis, vascular malformations, or diverticulosis.        retroflexion was unremarkable. There were moderate internal hemorrhoids observed during pullout through the anal canal.          The scope was then withdrawn from the patient and the procedure terminated.  COMPLICATIONS: There were no complications.  IMPRESSION:  probable recurrent cancer in the proximal colon, adjacent to the ileocolonic anastomosis  RECOMMENDATIONS:await pathology results. Clear liquid diet in the meantime, in the event that a colonic resection  of this lesion is undertaken during this hospitalization.  _______________________________ Lorrin MaisRonald Lobo, MD 11/29/2013 4:23 PM

## 2013-11-30 ENCOUNTER — Encounter (HOSPITAL_COMMUNITY): Payer: Self-pay | Admitting: Gastroenterology

## 2013-11-30 ENCOUNTER — Encounter: Payer: Medicare Other | Admitting: Physical Therapy

## 2013-11-30 ENCOUNTER — Inpatient Hospital Stay (HOSPITAL_COMMUNITY): Payer: Medicare Other

## 2013-11-30 DIAGNOSIS — I4891 Unspecified atrial fibrillation: Secondary | ICD-10-CM

## 2013-11-30 DIAGNOSIS — Z950 Presence of cardiac pacemaker: Secondary | ICD-10-CM

## 2013-11-30 LAB — CBC
HEMATOCRIT: 29 % — AB (ref 36.0–46.0)
HEMOGLOBIN: 8.9 g/dL — AB (ref 12.0–15.0)
MCH: 25.1 pg — ABNORMAL LOW (ref 26.0–34.0)
MCHC: 30.7 g/dL (ref 30.0–36.0)
MCV: 81.7 fL (ref 78.0–100.0)
Platelets: 211 10*3/uL (ref 150–400)
RBC: 3.55 MIL/uL — ABNORMAL LOW (ref 3.87–5.11)
RDW: 16.4 % — ABNORMAL HIGH (ref 11.5–15.5)
WBC: 5.6 10*3/uL (ref 4.0–10.5)

## 2013-11-30 LAB — BASIC METABOLIC PANEL
Anion gap: 11 (ref 5–15)
BUN: 7 mg/dL (ref 6–23)
CO2: 25 mEq/L (ref 19–32)
Calcium: 8.7 mg/dL (ref 8.4–10.5)
Chloride: 107 mEq/L (ref 96–112)
Creatinine, Ser: 0.93 mg/dL (ref 0.50–1.10)
GFR, EST AFRICAN AMERICAN: 61 mL/min — AB (ref >90)
GFR, EST NON AFRICAN AMERICAN: 53 mL/min — AB (ref >90)
GLUCOSE: 88 mg/dL (ref 70–99)
POTASSIUM: 3.9 meq/L (ref 3.7–5.3)
Sodium: 143 mEq/L (ref 137–147)

## 2013-11-30 MED ORDER — IOHEXOL 300 MG/ML  SOLN
50.0000 mL | INTRAMUSCULAR | Status: AC
  Administered 2013-11-30 (×2): 50 mL via ORAL

## 2013-11-30 MED ORDER — IOHEXOL 300 MG/ML  SOLN
100.0000 mL | Freq: Once | INTRAMUSCULAR | Status: AC | PRN
  Administered 2013-11-30: 100 mL via INTRAVENOUS

## 2013-11-30 NOTE — Progress Notes (Signed)
Pt's mass is bx-confirmed adenocarcinoma, per t/c w/ Dr. Lyndon Code of pathology.  Have informed pt, dtr, (at bedside), and son (via t/c).  Have placed a call to Surgery--perhaps pt can be operated on while still prepped from yesterday's colonoscopy.    ??? Need for pre-op CT or CEA (will defer to surgeons).  Will sign off; call if I can be of further help w/ this pleasant pt.  Cleotis Nipper, M.D. [phone]

## 2013-11-30 NOTE — Consult Note (Signed)
[name] 06/01/1922  [id number].   Primary Care MD: Dr. Morrie Sheldon Requesting MD: Dr. Ronald Lobo Chief Complaint/Reason for Consult: recurrent colon CA at ileocolonic anastomosis  HPI: This is a very sweet 80 yo WF who has multiple medical problems with prior colon cancer.  She had an initial sigmoid colectomy in February 2013 for an obstructing colon cancer.  About 10 months later she was found to have a synchronous lesion in her cecum.  Dr. Redmond Pulling went back in in December of 2013 and performed a right colectomy.  She had a CVA at rehab and was put on Eliquis during this time as well.  She lives independently, but has someone stay with her at night and a helper 3 days a week in the morning.  She was noted to have nausea last week and found to have a UTI per her PCP, but a hgb that was in the upper 7 range as well.  She was admitted to the hospital for further workup.  She denies blood in her stools, but states they are usually dark due to the meds she usually takes.  She denies any abdominal pain.  She had an EGD which was negative.  She underwent a c-scope then as well, that revealed a friable lesion at her ileocolonic anastomosis that is likely the source of her anemia.  This was biopsied and confirmed adenocarcinoma.  We have been asked to see her for further evaluation.   ROS: Please see HPI, otherwise negative.  She denies any residual side effects from her CVA.  She does get mildly SOB walking around her yard, but that doesn't stop her from doing what she needs to do.  She denies PND.  Family History  Problem Relation Age of Onset  . Anesthesia problems Neg Hx   . Hypotension Neg Hx   . Malignant hyperthermia Neg Hx   . Pseudochol deficiency Neg Hx   . Cancer Mother     ovarian    Past Medical History  Diagnosis Date  . Diastolic heart failure   . Ovarian cancer   . Colon cancer   . Arthritis   . History of Clostridium difficile infection     in Jan 2013  . Pacemaker   .  History of cellulitis and abscess   . Hypertension     takes Ramipril,Diltiazem daily  . Atrial fibrillation     takes Coumadin daily  . Tachy-brady syndrome 2008    s/p PPM implantation by Dr Oswald Hillock takes Betapace daily  . Osteoporosis     takes Prolia every 58month injections  . Shortness of breath     with exertion;Albuterol prn  . Bruises easily     takes COumadin daily  . GERD (gastroesophageal reflux disease)     takes Protonix daily  . Constipation     Milk of Mag nightly  . Anemia     takes Iron daily    Past Surgical History  Procedure Laterality Date  . Abdominal hysterectomy    . Oophorectomy    . Colonoscopy  03/30/2011    Procedure: COLONOSCOPY;  Surgeon: JMissy Sabins MD;  [place] [facility]  Service: Endoscopy;  Laterality: N/A;  . Esophagogastroduodenoscopy  03/30/2011    Procedure: ESOPHAGOGASTRODUODENOSCOPY (EGD);  Surgeon: JMissy Sabins MD;  [facility];  Service: Endoscopy;  Laterality: N/A;  . Colon resection  04/03/2011    Procedure: COLON RESECTION LAPAROSCOPIC;  Surgeon: EGayland Curry MD;  [place] [facility]  Service: General;  Laterality: N/A;  Laparoscopic, turned open sigmoid colon resection, lysis of adhesions x 2.5 hours, rigid proctoscopy.  . Pacemaker placement  2008    MDT EnRhythm implanted by Dr Leonia Reeves, generator change (MDT Adapta L) by Dr Rayann Heman 08/04/11  . Colonoscopy  12/24/2011    Procedure: COLONOSCOPY;  Surgeon: Rogene Houston, MD;  [facility];  Service: Endoscopy;  Laterality: N/A;  . Bilateral cataracts    . Battery changed in pacemaker    . Laparoscopic right hemi colectomy  02/19/2012    Procedure: LAPAROSCOPIC RIGHT HEMI COLECTOMY;  Surgeon: Gayland Curry, MD,FACS;  Location: [place];  Service: General;  Laterality: Right;  attempted laparoscopic assisted right hemi colectomy converted to open.  . Partial colectomy  02/19/2012    Procedure: PARTIAL COLECTOMY;  Surgeon: Gayland Curry, MD,FACS;  Location:  [place];  Service: General;  Laterality: Right;  . Lysis of adhesion  02/19/2012    Procedure: LYSIS OF ADHESION;  Surgeon: Gayland Curry, MD,FACS;  Location: [place];  Service: General;  Laterality: N/A;  . Esophagogastroduodenoscopy N/A 11/28/2013    Procedure: ESOPHAGOGASTRODUODENOSCOPY (EGD);  Surgeon: Jeryl Columbia, MD;  [facility];  Service: Endoscopy;  Laterality: N/A;  . Colonoscopy with propofol N/A 11/29/2013    Procedure: COLONOSCOPY WITH PROPOFOL;  Surgeon: Cleotis Nipper, MD;  Location: [place];  Service: Endoscopy;  Laterality: N/A;    Social History:  reports that she has never smoked. She has never used smokeless tobacco. She reports that she does not drink alcohol or use illicit drugs.  Allergies:  Allergies  Allergen Reactions  . Amiodarone Other (See Comments)    pacemaker  . Diovan [Valsartan] Other (See Comments)  . Lasix [Furosemide] Swelling    Tolerate Bumex  . Penicillins Nausea And Vomiting and Swelling    Lip swelling and redness  . Sulfa Antibiotics Rash    Medications Prior to Admission  Medication Sig Dispense Refill  . apixaban (ELIQUIS) 5 MG TABS tablet Take 1 tablet (5 mg total) by mouth 2 (two) times daily.  60 tablet  4  . calcium-vitamin D (OSCAL WITH D) 500-200 MG-UNIT per tablet Take 1 tablet by mouth 2 (two) times daily.      Marland Kitchen diltiazem (CARDIZEM CD) 180 MG 24 hr capsule Take 180 mg by mouth daily.      . famotidine (PEPCID) 20 MG tablet Take 20 mg by mouth 2 (two) times daily.      . ferrous sulfate 325 (65 FE) MG tablet Take 1 tablet (325 mg total) by mouth 3 (three) times daily with meals.  90 tablet  3  . magnesium hydroxide (MILK OF MAGNESIA) 400 MG/5ML suspension Take 30 mLs by mouth at bedtime.       . ondansetron (ZOFRAN) 4 MG tablet Take 1 tablet (4 mg total) by mouth every 8 (eight) hours as needed for nausea or vomiting.  30 tablet  1  . pantoprazole (PROTONIX) 40 MG tablet Take 1 tablet (40 mg total) by mouth daily.  30  tablet  5  . ramipril (ALTACE) 10 MG capsule Take 10 mg by mouth daily.      . sotalol (BETAPACE) 120 MG tablet Take 240 mg by mouth 2 (two) times daily.         Blood pressure 132/66, pulse 59, temperature 98.3 F (36.8 C), temperature source Oral, resp. rate 18, height _0  (1.676 m), weight 192 lb 10.9 oz (87.4 kg), SpO2  100.00%. Physical Exam: General: pleasant, WD, WN white female who is sitting up in NAD HEENT: head is normocephalic, atraumatic.  Sclera are noninjected.  PERRL.  Ears and nose without any masses or lesions.  Mouth is pink and moist Heart: paced.  Normal s1,s2. No obvious murmurs, gallops, or rubs noted.  Palpable radial and pedal pulses bilaterally Lungs: CTAB, no wheezes, rhonchi, or rales noted.  Respiratory effort nonlabored Abd: soft, NT, ND, +BS, no masses, hernias, or organomegaly, midline scars noted from prior operations MS: all 4 extremities are symmetrical with no cyanosis, clubbing, or edema. Skin: warm and dry with no masses, lesions, or rashes Psych: A&Ox3 with an appropriate affect.    Results for orders placed during the hospital encounter of 11/27/13 (from the past 48 hour(s))  CBC     Status: Abnormal   Collection Time    11/28/13  6:10 PM      Result Value Ref Range   WBC 6.6  4.0 - 10.5 K/uL   RBC 3.85 (*) 3.87 - 5.11 MIL/uL   Hemoglobin 9.5 (*) 12.0 - 15.0 g/dL   Comment: REPEATED TO VERIFY     DELTA CHECK NOTED     POST TRANSFUSION SPECIMEN   HCT 30.6 (*) 36.0 - 46.0 %   MCV 79.5  78.0 - 100.0 fL   MCH 24.7 (*) 26.0 - 34.0 pg   MCHC 31.0  30.0 - 36.0 g/dL   RDW 15.7 (*) 11.5 - 15.5 %   Platelets 262  150 - 400 K/uL  CBC     Status: Abnormal   Collection Time    11/29/13  4:32 AM      Result Value Ref Range   WBC 5.7  4.0 - 10.5 K/uL   RBC 3.65 (*) 3.87 - 5.11 MIL/uL   Hemoglobin 9.0 (*) 12.0 - 15.0 g/dL   HCT 28.9 (*) 36.0 - 46.0 %   MCV 79.2  78.0 - 100.0 fL   MCH 24.7 (*) 26.0 - 34.0 pg   MCHC 31.1  30.0 - 36.0 g/dL   RDW  15.8 (*) 11.5 - 15.5 %   Platelets 224  150 - 400 K/uL  BASIC METABOLIC PANEL     Status: Abnormal   Collection Time    11/29/13  4:32 AM      Result Value Ref Range   Sodium 142  137 - 147 mEq/L   Potassium 3.8  3.7 - 5.3 mEq/L   Chloride 106  96 - 112 mEq/L   CO2 25  19 - 32 mEq/L   Glucose, Bld 84  70 - 99 mg/dL   BUN 12  6 - 23 mg/dL   Creatinine, Ser 0.89  0.50 - 1.10 mg/dL   Calcium 8.7  8.4 - 10.5 mg/dL   GFR calc non Af Amer 56 (*) >90 mL/min   GFR calc Af Amer 65 (*) >90 mL/min   Comment: (NOTE)     The eGFR has been calculated using the CKD EPI equation.     This calculation has not been validated in all clinical situations.     eGFR's persistently <90 mL/min signify possible Chronic Kidney     Disease.   Anion gap 11  5 - 15  CBC     Status: Abnormal   Collection Time    11/30/13  7:36 AM      Result Value Ref Range   WBC 5.6  4.0 - 10.5 K/uL   RBC 3.55 (*) 3.87 -  5.11 MIL/uL   Hemoglobin 8.9 (*) 12.0 - 15.0 g/dL   HCT 29.0 (*) 36.0 - 46.0 %   MCV 81.7  78.0 - 100.0 fL   MCH 25.1 (*) 26.0 - 34.0 pg   MCHC 30.7  30.0 - 36.0 g/dL   RDW 16.4 (*) 11.5 - 15.5 %   Platelets 211  150 - 400 K/uL  BASIC METABOLIC PANEL     Status: Abnormal   Collection Time    11/30/13  7:36 AM      Result Value Ref Range   Sodium 143  137 - 147 mEq/L   Potassium 3.9  3.7 - 5.3 mEq/L   Chloride 107  96 - 112 mEq/L   CO2 25  19 - 32 mEq/L   Glucose, Bld 88  70 - 99 mg/dL   BUN 7  6 - 23 mg/dL   Creatinine, Ser 0.93  0.50 - 1.10 mg/dL   Calcium 8.7  8.4 - 10.5 mg/dL   GFR calc non Af Amer 53 (*) >90 mL/min   GFR calc Af Amer 61 (*) >90 mL/min   Comment: (NOTE)     The eGFR has been calculated using the CKD EPI equation.     This calculation has not been validated in all clinical situations.     eGFR's persistently <90 mL/min signify possible Chronic Kidney     Disease.   Anion gap 11  5 - 15   No results found.     Assessment/Plan 1. Recurrent adenocarcinoma of colon at  ileocolonic anastomosis 2. ABL anemia secondary to #1 and chronic anticoagulation on Eliquis 3. Previous CVA 4. History of sigmoid and cecal adenocarcinoma, s/p resections 5. Diastolic HF 6. HTN 7. A. Fib 8. H/o ovarian cancer 9. Tachy-brady syndrome, s/p pacemaker  Plan: 1. This is a difficult situation given this lesion is not obstructing, but is bleeding and her need for Eliquis.  We will start out with getting PAN CT scans of her C/A/P to rule out metastatic disease.  Once this is complete, we will have to discuss the possible timing of surgery.  We have also recommended cardiology see her for clearance.  She has not had a recent ECHO, at least in our system.  It would be good to get their opinion as well.  We will follow along.  I have made her NPO, just in case something can be done tomorrow, which I think it doubtful at this time.    Yuritzy Zehring E 11/30/2013, 2:56 PM Pager: [phone]

## 2013-11-30 NOTE — Consult Note (Signed)
Agree with above 

## 2013-11-30 NOTE — Progress Notes (Signed)
Pt with increased confusion/ anxiety tonight. RN tried to reassure patient multiple times that everything is fine. Will continue to monitor.

## 2013-11-30 NOTE — Progress Notes (Signed)
PROGRESS NOTE  [name]:[id number] DOB: 02-13-1923 DOA: 11/27/2013 PCP: [name], MD  HPI/Subjective: [name] is a 80 y.o. Female with history of AFib, with stroke in December, now on Eliquis, and colon cancer. She was sent to ED by her PCP with N,V and decreasing Hgb with suspected GI bleed, guaiac positive. She continues to have no complaints, and denies N, V, and abdominal pain. Last BM was 9/16. Had EGD 9/15 revealed no bleeding source ,colonoscopy on 9/17, with findings of colon mass, with pathology results showing adenocarcinoma with lymphovascular invasion.   Assessment/Plan:  GI Bleed with acute blood loss anemia  - Admitted, kept n.p.o., Eliquis on hold for now, GI consulted, EGD on 9/15 revealed no bleeding foci. - Colonoscopy revealed friable mass adjacent to ileocolonic anastomosis, with pathology showing adenocarcinoma with lymphovascular invasion, surgery were consulted for further evaluation and possible need for resection. - Continue PPI, transfused 2 units of PRBC 9/15. Hb remains stable at 8.9 today.  -Will continue to monitor Hgb.   Hypertension  - Stable at this time  - Cautiously continue Diltiazem, ramipril   Atrial Fibrillation  - Remains in sinus rhythm  - Hold Eliquis at this time, continue sotalol and Cardizem   Recent UTI  - Stop Macrodantin, no evidence of UTI at this time. Seems to have already completed 5 days of treatment.   History of colon cancer  - Status post colectomy -Colonoscopy performed 9/16, with findings suspicious for recurrent malignancy, biopsy pending.  - GI requesting continuation of NPO status while awaiting biopsy results in the event the patient wishes to proceed with surgery this admission  History of CVA  - Nonfocal exam.  - Eliquis on hold given ongoing GI bleeding      DVT Prophylaxis:  SCDs   Code Status: DNR Family Communication: Patient alert and oriented Disposition Plan:  Remain inpatient at this  time  Consultants:  GI .    Surgery Procedures:  EGD 9/15  Colonoscopy 9/16  Antibiotics:  None  Objective: Filed Vitals:   11/29/13 1620 11/29/13 1623 11/29/13 2138 11/30/13 0523  BP: 127/55 132/53 127/52 131/51  Pulse: 60 71 66 62  Temp:   98.3 F (36.8 C) 98.5 F (36.9 C)  TempSrc:   Oral Oral  Resp: 19 21 20 20   Height:      Weight:      SpO2: 100% 87% 97% 94%   No intake or output data in the 24 hours ending 11/30/13 0839 Filed Weights   11/27/13 1626 11/27/13 2300  Weight: 88.451 kg (195 lb) 87.4 kg (192 lb 10.9 oz)    Exam: General: Well developed, well nourished, asleep upon entry to room, NAD Cardiovascular: RRR, S1 S2 auscultated, no rubs, clicks, murmurs or gallops.   Respiratory: Clear to auscultation bilaterally without wheezes, rales or rhonchi Abdomen: Soft, nontender, nondistended, + bowel sounds .  Neuro: Alert and oriented, cranial nerves grossly intact.  Psych: Patient is pleasant, happy, and appreciative.   Data Reviewed: Basic Metabolic Panel:  Recent Labs Lab 11/24/13 1429 11/27/13 1635 11/28/13 0623 11/29/13 0432  NA 138 141 143 142  K 4.6 4.8 4.4 3.8  CL 98 102 108 106  CO2 27 27 25 25   GLUCOSE 110* 100* 92 84  BUN 16 18 17 12   CREATININE 1.05* 0.85 0.92 0.89  CALCIUM 8.9 9.4 8.6 8.7   Liver Function Tests:  Recent Labs Lab 11/24/13 1429 11/27/13 1635  AST 17 20  ALT 8  11  ALKPHOS 69 71  BILITOT 0.3 0.3  PROT 6.8 6.9  ALBUMIN  --  3.5   CBC:  Recent Labs Lab 11/27/13 1635 11/28/13 0623 11/28/13 1810 11/29/13 0432 11/30/13 0736  WBC 7.2 5.4 6.6 5.7 5.6  NEUTROABS 5.1  --   --   --   --   HGB 7.4* 6.8* 9.5* 9.0* 8.9*  HCT 25.0* 22.7* 30.6* 28.9* 29.0*  MCV 78.6 78.5 79.5 79.2 81.7  PLT 288 209 262 224 211     Recent Results (from the past 240 hour(s))  URINE CULTURE     Status: None   Collection Time    11/21/13  1:38 PM      Result Value Ref Range Status   Urine Culture, Routine Final report    Final   Result 1 Comment   Final   Comment: Mixed urogenital flora     Greater than 100,000 colony forming units per mL     Studies: No results found.  Scheduled Meds: . diltiazem  240 mg Oral Daily  . famotidine  20 mg Oral BID  . magnesium hydroxide  30 mL Oral QHS  . pantoprazole  40 mg Oral Daily  . ramipril  10 mg Oral Daily  . sodium chloride  3 mL Intravenous Q12H  . sotalol  240 mg Oral BID   Continuous Infusions: . sodium chloride 50 mL/hr at 11/29/13 2253  . sodium chloride    . sodium chloride      Principal Problem:   GIB (gastrointestinal bleeding) Active Problems:   Acute blood loss anemia   Hypertension   Atrial fibrillation  Waylan Rocher, PA-S2    [facility] Pager [phone]. If 7PM-7AM, please contact night-coverage at [email or web address], password [facility] 11/30/2013, 8:39 AM  LOS: 3 days   Attending  Patient was seen, examined,treatment plan was discussed with the Physician extender. I have directly reviewed the clinical findings, lab, imaging studies and management of this patient in detail. I have made the necessary changes to the above noted documentation, and agree with the documentation, as recorded by the Physician.  Phillips Climes MD.

## 2013-11-30 NOTE — Care Management Note (Signed)
    Page 1 of 1   12/01/2013     3:56:49 PM CARE MANAGEMENT NOTE 12/01/2013  Patient:  [name], [name]   Account Number:  [id number]  Date Initiated:  11/30/2013  Documentation initiated by:  Tomi Bamberger  Subjective/Objective Assessment:   dx anemia, gib, afib  admit- lives with family.     Action/Plan:   9/16- colonscopy- 1.3 mass ? ca  pt eval- rec hhpt   Anticipated DC Date:  12/01/2013   Anticipated DC Plan:  [place]  CM consult      [facility] Choice  HOME HEALTH   Choice offered to / List presented to:  C-1 Patient        [place] arranged  HH-1 RN  [place].   Status of service:  Completed, signed off Medicare Important Message given?  YES (If response is "NO", the following Medicare IM given date fields will be blank) Date Medicare IM given:  11/30/2013 Medicare IM given by:  Tomi Bamberger Date Additional Medicare IM given:   Additional Medicare IM given by:    Discharge Disposition:  [place]  Per UR Regulation:  Reviewed for med. necessity/level of care/duration of stay  If discussed at [place] of Stay Meetings, dates discussed:    Comments:  12/01/13 [place], BSN [phone] patient is for possible dc today, patient chose [facility] for [facility], and HHPT, referral made to [facility], [place] Penny notified.  HHRN to draw CBC Monday and sent to PCP office.  Soc will begin 24-48 hrs post dc.

## 2013-11-30 NOTE — Consult Note (Signed)
 Reason for Consult: Pre-Operative Clearance Referring Physician: General Surgery Primary Cardiologist: Dr. Allred   HPI: The patient is an 80-year-old female whom we have been consulted to see regarding surgical clearance as she was recently discovered to have recurrent colon cancer. She underwent a prior colectomies in 2012 and 2013, also for colon CA. A recent workup for anemia led to her new diagnosis. She was found to have a friable lesion at the ileocolonic anastomosis. Biopsies confirmed adenocarcinoma. Although this is not an obstructing lesion it is the source of her bleed.  Her cardiac history is significant for atrial fibrillation, on antiarrhythmic therapy with sotalol. She's had a previous CVA and subsequently has been on anticoagulation with Eliquis. She also has a history of tachycardia/bradycardia, status post permanent pacemaker (MDT) by Dr. Edmunds in 2008. She underwent a generator change in 2013. She is now followed by Dr. Allred in the electrophysiology device clinic. Her history is also significant for diastolic heart failure and hypertension. No prior history of MI and no history of coronary stenting. She had a nuclear stress test in 2005 that was negative for ischemia. Her last 2-D echocardiogram was in December 2013 revealing normal systolic function with an estimated ejection fraction of 60-65%. There was moderate concentric hypertrophy. Wall motion was normal; there were no regional wall motion abnormalities. The left atrium was moderately dilated. The aortic valve had a mildly calcified annulus and was mildly thickened with mildly calcified leaflets, however no stenosis. Only trivial mitral as well as tricuspid regurgitation was noted.   Today during interview, she reports that she is a fairly independent 80-year-old. She lives home alone and is able to perform ADLs including cooking and household chores without difficulty. She is able to participate in activities such as  mopping and vacuuming without any exertional chest discomfort and no exertional dyspnea. She also denies any resting and nocturnal chest discomfort. She denies orthopnea, PND, lower extremity edema, palpitations, lightheadedness, dizziness, syncope/near-syncope. No personal history of diabetes or tobacco abuse. She reports full daily medication compliance.  Her heart rate and blood pressure both appear to be well-controlled and stable. An EKG has not yet been performed this admission, but no arrhythmias on telemetry. Her Eliquis is currently on hold. Hgb today is 8.9. She continues on Cardizem and sotalol for her afib.    Past Medical History  Diagnosis Date  . Diastolic heart failure   . Ovarian cancer   . Colon cancer   . Arthritis   . History of Clostridium difficile infection     in Jan 2013  . Pacemaker   . History of cellulitis and abscess   . Hypertension     takes Ramipril,Diltiazem daily  . Atrial fibrillation     takes Coumadin daily  . Tachy-brady syndrome 2008    s/p PPM implantation by Dr Edmunds;and takes Betapace daily  . Osteoporosis     takes Prolia every 6months injections  . Shortness of breath     with exertion;Albuterol prn  . Bruises easily     takes COumadin daily  . GERD (gastroesophageal reflux disease)     takes Protonix daily  . Constipation     Milk of Mag nightly  . Anemia     takes Iron daily    Past Surgical History  Procedure Laterality Date  . Abdominal hysterectomy    . Oophorectomy    . Colonoscopy  03/30/2011    Procedure: COLONOSCOPY;  Surgeon: John C Hayes, MD;  Location: [facility];    Service: Endoscopy;  Laterality: N/A;  . Esophagogastroduodenoscopy  03/30/2011    Procedure: ESOPHAGOGASTRODUODENOSCOPY (EGD);  Surgeon: John C Hayes, MD;  Location: [facility];  Service: Endoscopy;  Laterality: N/A;  . Colon resection  04/03/2011    Procedure: COLON RESECTION LAPAROSCOPIC;  Surgeon: Eric M Wilson, MD;  Location: [facility] OR;  Service:  General;  Laterality: N/A;  Laparoscopic, turned open sigmoid colon resection, lysis of adhesions x 2.5 hours, rigid proctoscopy.  . Pacemaker placement  2008    MDT EnRhythm implanted by Dr Edmunds, generator change (MDT Adapta L) by Dr Allred 08/04/11  . Colonoscopy  12/24/2011    Procedure: COLONOSCOPY;  Surgeon: Najeeb U Rehman, MD;  Location: [facility];  Service: Endoscopy;  Laterality: N/A;  . Bilateral cataracts    . Battery changed in pacemaker    . Laparoscopic right hemi colectomy  02/19/2012    Procedure: LAPAROSCOPIC RIGHT HEMI COLECTOMY;  Surgeon: Eric M Wilson, MD,FACS;  Location: [facility];  Service: General;  Laterality: Right;  attempted laparoscopic assisted right hemi colectomy converted to open.  . Partial colectomy  02/19/2012    Procedure: PARTIAL COLECTOMY;  Surgeon: Eric M Wilson, MD,FACS;  Location: [facility];  Service: General;  Laterality: Right;  . Lysis of adhesion  02/19/2012    Procedure: LYSIS OF ADHESION;  Surgeon: Eric M Wilson, MD,FACS;  Location: [facility];  Service: General;  Laterality: N/A;  . Esophagogastroduodenoscopy N/A 11/28/2013    Procedure: ESOPHAGOGASTRODUODENOSCOPY (EGD);  Surgeon: Marc E Magod, MD;  Location: [facility];  Service: Endoscopy;  Laterality: N/A;  . Colonoscopy with propofol N/A 11/29/2013    Procedure: COLONOSCOPY WITH PROPOFOL;  Surgeon: Robert Buccini V, MD;  Location: [facility];  Service: Endoscopy;  Laterality: N/A;    Family History  Problem Relation Age of Onset  . Anesthesia problems Neg Hx   . Hypotension Neg Hx   . Malignant hyperthermia Neg Hx   . Pseudochol deficiency Neg Hx   . Cancer Mother     ovarian    Social History:  reports that she has never smoked. She has never used smokeless tobacco. She reports that she does not drink alcohol or use illicit drugs.  Allergies:  Allergies  Allergen Reactions  . Amiodarone Other (See Comments)    pacemaker  . Diovan [Valsartan] Other (See Comments)  . Lasix [Furosemide]  Swelling    Tolerate Bumex  . Penicillins Nausea And Vomiting and Swelling    Lip swelling and redness  . Sulfa Antibiotics Rash    Medications:  Prior to Admission medications   Medication Sig Start Date End Date Taking? Authorizing Provider  apixaban (ELIQUIS) 5 MG TABS tablet Take 1 tablet (5 mg total) by mouth 2 (two) times daily. 09/18/13  Yes Treanna-Margaret Martin, FNP  calcium-vitamin D (OSCAL WITH D) 500-200 MG-UNIT per tablet Take 1 tablet by mouth 2 (two) times daily.   Yes Historical Provider, MD  diltiazem (CARDIZEM CD) 180 MG 24 hr capsule Take 180 mg by mouth daily.   Yes Historical Provider, MD  famotidine (PEPCID) 20 MG tablet Take 20 mg by mouth 2 (two) times daily.   Yes Historical Provider, MD  ferrous sulfate 325 (65 FE) MG tablet Take 1 tablet (325 mg total) by mouth 3 (three) times daily with meals. 11/24/13  Yes Christy A Hawks, FNP  magnesium hydroxide (MILK OF MAGNESIA) 400 MG/5ML suspension Take 30 mLs by mouth at bedtime.    Yes Historical Provider, MD  ondansetron (ZOFRAN) 4 MG   tablet Take 1 tablet (4 mg total) by mouth every 8 (eight) hours as needed for nausea or vomiting. 11/21/13  Yes Sharion Balloon, FNP  pantoprazole (PROTONIX) 40 MG tablet Take 1 tablet (40 mg total) by mouth daily. 09/18/13  Yes Andrianna-Margaret Hassell Done, FNP  ramipril (ALTACE) 10 MG capsule Take 10 mg by mouth daily.   Yes Historical Provider, MD  sotalol (BETAPACE) 120 MG tablet Take 240 mg by mouth 2 (two) times daily.    Yes Historical Provider, MD     Results for orders placed during the hospital encounter of 11/27/13 (from the past 48 hour(s))  CBC     Status: Abnormal   Collection Time    11/28/13  6:10 PM      Result Value Ref Range   WBC 6.6  4.0 - 10.5 K/uL   RBC 3.85 (*) 3.87 - 5.11 MIL/uL   Hemoglobin 9.5 (*) 12.0 - 15.0 g/dL   Comment: REPEATED TO VERIFY     DELTA CHECK NOTED     POST TRANSFUSION SPECIMEN   HCT 30.6 (*) 36.0 - 46.0 %   MCV 79.5  78.0 - 100.0 fL   MCH 24.7 (*)  26.0 - 34.0 pg   MCHC 31.0  30.0 - 36.0 g/dL   RDW 15.7 (*) 11.5 - 15.5 %   Platelets 262  150 - 400 K/uL  CBC     Status: Abnormal   Collection Time    11/29/13  4:32 AM      Result Value Ref Range   WBC 5.7  4.0 - 10.5 K/uL   RBC 3.65 (*) 3.87 - 5.11 MIL/uL   Hemoglobin 9.0 (*) 12.0 - 15.0 g/dL   HCT 28.9 (*) 36.0 - 46.0 %   MCV 79.2  78.0 - 100.0 fL   MCH 24.7 (*) 26.0 - 34.0 pg   MCHC 31.1  30.0 - 36.0 g/dL   RDW 15.8 (*) 11.5 - 15.5 %   Platelets 224  150 - 400 K/uL  BASIC METABOLIC PANEL     Status: Abnormal   Collection Time    11/29/13  4:32 AM      Result Value Ref Range   Sodium 142  137 - 147 mEq/L   Potassium 3.8  3.7 - 5.3 mEq/L   Chloride 106  96 - 112 mEq/L   CO2 25  19 - 32 mEq/L   Glucose, Bld 84  70 - 99 mg/dL   BUN 12  6 - 23 mg/dL   Creatinine, Ser 0.89  0.50 - 1.10 mg/dL   Calcium 8.7  8.4 - 10.5 mg/dL   GFR calc non Af Amer 56 (*) >90 mL/min   GFR calc Af Amer 65 (*) >90 mL/min   Comment: (NOTE)     The eGFR has been calculated using the CKD EPI equation.     This calculation has not been validated in all clinical situations.     eGFR's persistently <90 mL/min signify possible Chronic Kidney     Disease.   Anion gap 11  5 - 15  CBC     Status: Abnormal   Collection Time    11/30/13  7:36 AM      Result Value Ref Range   WBC 5.6  4.0 - 10.5 K/uL   RBC 3.55 (*) 3.87 - 5.11 MIL/uL   Hemoglobin 8.9 (*) 12.0 - 15.0 g/dL   HCT 29.0 (*) 36.0 - 46.0 %   MCV 81.7  78.0 - 100.0  fL   MCH 25.1 (*) 26.0 - 34.0 pg   MCHC 30.7  30.0 - 36.0 g/dL   RDW 16.4 (*) 11.5 - 15.5 %   Platelets 211  150 - 400 K/uL  BASIC METABOLIC PANEL     Status: Abnormal   Collection Time    11/30/13  7:36 AM      Result Value Ref Range   Sodium 143  137 - 147 mEq/L   Potassium 3.9  3.7 - 5.3 mEq/L   Chloride 107  96 - 112 mEq/L   CO2 25  19 - 32 mEq/L   Glucose, Bld 88  70 - 99 mg/dL   BUN 7  6 - 23 mg/dL   Creatinine, Ser 0.93  0.50 - 1.10 mg/dL   Calcium 8.7  8.4 - 10.5  mg/dL   GFR calc non Af Amer 53 (*) >90 mL/min   GFR calc Af Amer 61 (*) >90 mL/min   Comment: (NOTE)     The eGFR has been calculated using the CKD EPI equation.     This calculation has not been validated in all clinical situations.     eGFR's persistently <90 mL/min signify possible Chronic Kidney     Disease.   Anion gap 11  5 - 15    No results found.  Review of Systems  Respiratory: Negative for shortness of breath.   Cardiovascular: Negative for chest pain, palpitations, orthopnea, leg swelling and PND.  Neurological: Negative for dizziness and loss of consciousness.  All other systems reviewed and are negative.  Blood pressure 132/66, pulse 59, temperature 98.3 F (36.8 C), temperature source Oral, resp. rate 18, height 5' 6" (1.676 m), weight 192 lb 10.9 oz (87.4 kg), SpO2 100.00%. Physical Exam  Constitutional: She is oriented to person, place, and time. She appears well-developed and well-nourished. No distress.  Neck: No JVD present.  Cardiovascular: Normal rate, regular rhythm, normal heart sounds and intact distal pulses.  Exam reveals no gallop and no friction rub.   No murmur heard. Pulses:      Radial pulses are 2+ on the right side, and 2+ on the left side.       Dorsalis pedis pulses are 2+ on the right side, and 2+ on the left side.  Respiratory: Effort normal and breath sounds normal. No respiratory distress. She has no wheezes. She has no rales.  Musculoskeletal: She exhibits no edema.  Neurological: She is alert and oriented to person, place, and time.  Skin: Skin is warm and dry. She is not diaphoretic.  Psychiatric: She has a normal mood and affect. Her behavior is normal.    Assessment/Plan: Principal Problem:   GIB (gastrointestinal bleeding) Active Problems:   Hypertension   Atrial fibrillation   Acute blood loss anemia  1. Post-operative assessment: The patient has no known coronary disease and based on reported history, the patient has fairly  good cardiac functional status given her age as she is able to perform tasks such as mopping and vacuuming without any exertional chest discomfort. Telemetry currently shows normal sinus rhythm however we will also obtain an EKG. Last ischemic eval was a nuclear stress test in 2005 which was negative for ischemia. Her last 2-D echocardiogram was in December 2013 revealing normal systolic function with an estimated ejection fraction of 60-65%, without wall motion abnormalities and without any significant valvular abnormality. Her physical exam is fairly benign without any carotid bruits and without any significant cardiac murmurs. She denies any resting  dyspnea, orthopnea or PND. No exertional dyspnea with mild to moderate physical activity. Based on her history and lack of any anginal symptoms she would not require any ischemic evaluation prior to surgery. Based on the Revised Cardiac Risk Index, her estimated risk of adverse outcome with noncardiac surgery is moderate. Estimated rate of myocardial infarction, pulmonary embolism, ventricular fibrillation, cardiac arrest or complete heart block is 6.6%. Given her history of atrial fibrillation, she will need to be monitored closely postoperatively on telemetry. Recommend continuation of her beta blocker during the perioperative period. In regards to her Eliquis, I agree with holding for now as her risk for bleed is high. Resume once stable from a surgical standpoint, as CHADS-VASC score is >2 (prior CVA, Age >75 and female sex).  MD to follow with further recommendations.  SIMMONS, BRITTAINY 11/30/2013, 3:13 PM    Patient seen and examined. Agree with assessment and plan. 80 yo WF with h/o PAF on sotolol, s/p pacemaker insertion by Dr. Edmunds in 2008. She denies any chest pain or dyspnea. She has been NOAC therapy with Eliquis and recent anemia w/u disclosed an ileocolonic malignant adenocarcinoma. Patient is asymptomatic from a cardiac standpoint. She is  atrially paced with HR 61. Will obtain ECG since not yet done. EF is normal on echo 20 months ago.  OK to proceed with surgery. Continue soltolol, cardizem post op with telemetry and obtain ECG. Will follow.    A. , MD, FACC 11/30/2013 5:00 PM   

## 2013-12-01 DIAGNOSIS — C189 Malignant neoplasm of colon, unspecified: Secondary | ICD-10-CM

## 2013-12-01 DIAGNOSIS — Z8543 Personal history of malignant neoplasm of ovary: Secondary | ICD-10-CM

## 2013-12-01 DIAGNOSIS — C7800 Secondary malignant neoplasm of unspecified lung: Secondary | ICD-10-CM

## 2013-12-01 DIAGNOSIS — I4892 Unspecified atrial flutter: Secondary | ICD-10-CM

## 2013-12-01 DIAGNOSIS — I509 Heart failure, unspecified: Secondary | ICD-10-CM

## 2013-12-01 DIAGNOSIS — M810 Age-related osteoporosis without current pathological fracture: Secondary | ICD-10-CM

## 2013-12-01 LAB — CBC
HCT: 28.1 % — ABNORMAL LOW (ref 36.0–46.0)
Hemoglobin: 8.6 g/dL — ABNORMAL LOW (ref 12.0–15.0)
MCH: 25.1 pg — AB (ref 26.0–34.0)
MCHC: 30.6 g/dL (ref 30.0–36.0)
MCV: 81.9 fL (ref 78.0–100.0)
PLATELETS: 216 10*3/uL (ref 150–400)
RBC: 3.43 MIL/uL — ABNORMAL LOW (ref 3.87–5.11)
RDW: 16.8 % — AB (ref 11.5–15.5)
WBC: 5.7 10*3/uL (ref 4.0–10.5)

## 2013-12-01 LAB — FECAL OCCULT BLOOD, IMMUNOCHEMICAL: FECAL OCCULT BLD: POSITIVE — AB

## 2013-12-01 MED ORDER — ASPIRIN EC 81 MG PO TBEC: 81.0000 mg | DELAYED_RELEASE_TABLET | Freq: Every day | ORAL | Status: AC

## 2013-12-01 NOTE — Progress Notes (Signed)
[place] NOTE 12/01/2013  Patient Care Team: Chipper Herb, MD as PCP - General (Family Medicine) Rogene Houston, MD as Consulting Physician (Gastroenterology)  CHIEF COMPLAINTS/PURPOSE OF CONSULTATION:   1. Primary Colon cancer with lung metastasis (untreated) stage 4 disease. 2. History of colon cancer in 2013 in February and December 2013 treated by surgery. 3. History of Ovarian cancer 2004 treated with chemotherapy and surgery   Assessment:  1. [name] is a 80 years old female from [place], [place] with a history of atrial fibrillation, stroke, CHF, anemia, colon cancer, ovarian cancer who presented with GI symptoms on 11/27/2013. Her hemoglobin in the ER was 7.4 grams. GI services were consulted. Patient was tested and guaiac positive. She also had some vague abdominal discomfort. She underwent a colonoscopy which  revealed a friable lesion at her ileocolonic anastomosis that is likely the source of her anemia. This was biopsied and confirmed grade 3 or poorly differentiated adenocarcinoma with +LVI (positive lymphovascular invasion).   2. A CT scan of Chest, abdomen 11/30/2013 showed Right lower lobe pulmonary mass and left lower lobe pulmonary  nodule concerning for pulmonary metastatic disease. Additionally there is superior mediastinal adenopathy concerning for metastatic adenopathy. 2. Masslike thickening along the anterior aspect of the cecum most compatible with adenocarcinoma at the ileocecal location. 3. Interval increase in presacral soft tissue thickening and perirectal soft tissue stranding. Local regional recurrence at this location is not excluded. 4. Soft tissue mass within the anterior abdominal wall may be postoperative in etiology however metastatic implant is of concern. 5. Few dilated loops of small bowel within the central abdomen may  represent focal ileus.Based on the Cat scan we are dealing with a stage 4 colon cancer with pulmonary  metastasis.      3. She had an initial laparoscopic sigmoid colectomy in February 2013 for an obstructing colon cancer (T3,N0,MX) size 5.3 cm, 21 lymph nodes negative, margins negative, no LVI, no PNI, Grade 2 . About 10 months later she was found to have a synchronous lesion in her cecum. Dr. Redmond Pulling went back in in December of 2013 and performed a right colectomy (T4a,pN1A,MX). It was a grade 2 moderately differentiated cancer, no LVI, 1/24 LN+, KRAS mutated (codon 13), no perforation, inked margin serosa positive. She did not get any adjuvant chemotherapy both times.   4. Patient also have a history of Stage 1-C Ovarian cancer diagnosed October 2004 for which she received adjuvant chemotherapy with Carboplatin and Taxol completed in March 2005 with normalization of her CA-125 marker. Her vaginal hysterectomy showed Maximum tumor size (cm): 21 cm , tumor location (right/left/both): Left, Histology: Clear cell carcinoma, Grade: N/A, Capsule (intact/ruptured): Ruptured, Peritoneal implants: No, Pelvic extension: No, Peritoneal washings: Negative as is the diaphragmatic scrapping ([id number], [id number]. Lymph nodes: # examined 9 ; # positive 0, TNM code: pT1c, pN0, pMX, This is a large left ovarian clear cell carcinoma. The tumor capsule was focally disrupted and so this was a Stage 1c lesion. Tumor is not identified elsewhere within the specimen. She is in remission from ovarian cancer and was being followed by Dr Marko Plume and Dr Marti Sleigh.  5. Patient with history of TIA, Atrial fibrillation and pacer was on Eliquis therapy which has been stopped because of bleeding and Cardiology agreed with that. She got 2 units of blood in the hospital.  6. We are asked to see patient and give recommendations on her prognosis and treatment options.  PLAN:   1.  stage IV colon cancer with pulmonary metastasis is not a curative situation. The treatment goal is palliative and symptom management.  2. If  she is not actively bleeding while she is off anticoagulation, there is no immediate need for surgery. We can consider treating her with systemic chemotherapy. That would be tough on her as well and the options include modified FOLFOX regimen, FOLFIRI regimen, Xeloda, Stivarga, 5-FU, cetuximab, Panitumumab, Avastin etc. We will have to use chemotherapy very carefully based on her age and presence of comorbid conditions. I also touched the subject of adopting a palliative care approach or hospice but patient is not ready for it yet.  3. We will discuss her case in the upcoming multi-disciplinary GI tumor board with the surgeons and the radiation oncologist to see what would be an optimal plan for her and whether we need to incorporate surgery, CyberKnife for lung oligomets, radiation in her treatment plan or just consider chemotherapy alone.   4. Ask pathology to check tumor for k-ras mutation and that will determine her eligibility for drugs like Cetuximab or Panitumumab.   5. Patient would prefer getting her oncology care at [facility] in [place], [place] as that is close to her and she can get her systemic chemotherapy, blood transfusions, iron infusion all there and I will arrange for her initial visit there next week. Their oncology group is also connected with [facility] and the treatment recommendations will be passed along to the oncologist there after we present her case in tumor board here and I discuss it with Dr Redmond Pulling.  6. I think a palliative approach with Single agent  Xeloda may be very reasonable to see her tolerance and response to chemotherapy and it will help [place] determine whether she can tolerate regimens like folfox or folfiri in future. If she has a wild type kras mutation status one can consider adding cetuximab or using Vectibix as therapy options.   7. A CEA and a CA-125 marker has been ordered as well. The pulmonary mets are likely from colon cancer and  unlikely from her prior ovarian cancer.   HISTORY OF PRESENTING ILLNESS:   [name] is a 80 y.o. female h/o A.Fib with stroke in Dec now on Eliquis, who presents to the ED with nausea, vomiting. Symptoms have been going on for the past week, intermittently. Associated with dark stools (although they are always dark, likely due to chronic iron supplementation). Vomit is dark in color, peptobismol has provided no relief. Seen at PCPs office a week ago, noted to have new onset anemia with HGB 8.3 (was 12+ in July) referral done for [facility] at that time, HGB was 7.7 on Friday and 7.4 today in ED. Hemoccult positive. She got blood in hospital and workup has been done including the cat scan of chest, abdomen and pelvis.  There is a probable lymph node within the right major fissure (series 3 image 26). 2.4 mm pleural based nodule superior segment right lower lobe (series 3 image 24). Mild hazy parenchymal changes left upper lobe spanning over 1.5 cm (series 3 image 23). Inferior left lateral lower lobe 1.5 x 1 x 1.6 cm nonspecific consolidation/lesion (series 3 image 51). These  latter two findings may represent minimal pneumonitis type changes and atelectasis respectively. To exclude the presence of a mass, follow-up CT of the chest in 3 months recommended. Pacemaker enters from the left with leads in the region of the  right atrium and right ventricle.  Cardiomegaly. Prominent coronary artery calcifications. Atherosclerotic type changes of the thoracic aorta and great vessels. Mild ectasia ascending thoracic aorta measuring up to 3.5 cm. No obvious pulmonary embolus (pulmonary embolus protocol not utilized) No mediastinal, hilar or axillary adenopathy. Small hiatal hernia. No bony destructive lesion. She had a colonoscopy done which confirmed a primary versus a recurrent tumor.     MEDICAL HISTORY:  Past Medical History  Diagnosis Date  . Diastolic heart failure   . Ovarian cancer   . Colon cancer     . Arthritis   . History of Clostridium difficile infection     in Jan 2013  . Pacemaker   . History of cellulitis and abscess   . Hypertension     takes Ramipril,Diltiazem daily  . Atrial fibrillation     takes Coumadin daily  . Tachy-brady syndrome 2008    s/p PPM implantation by Dr Oswald Hillock takes Betapace daily  . Osteoporosis     takes Prolia every 52month injections  . Shortness of breath     with exertion;Albuterol prn  . Bruises easily     takes COumadin daily  . GERD (gastroesophageal reflux disease)     takes Protonix daily  . Constipation     Milk of Mag nightly  . Anemia     takes Iron daily  . CHF (congestive heart failure)     SURGICAL HISTORY: Past Surgical History  Procedure Laterality Date  . Abdominal hysterectomy    . Oophorectomy    . Colonoscopy  03/30/2011    Procedure: COLONOSCOPY;  Surgeon: JMissy Sabins MD;  [facility]  Service: Endoscopy;  Laterality: N/A;  . Esophagogastroduodenoscopy  03/30/2011    Procedure: ESOPHAGOGASTRODUODENOSCOPY (EGD);  Surgeon: JMissy Sabins MD;  [facility];  Service: Endoscopy;  Laterality: N/A;  . Colon resection  04/03/2011    Procedure: COLON RESECTION LAPAROSCOPIC;  Surgeon: EGayland Curry MD;  [place] [facility]  Service: General;  Laterality: N/A;  Laparoscopic, turned open sigmoid colon resection, lysis of adhesions x 2.5 hours, rigid proctoscopy.  . Pacemaker placement  2008    MDT EnRhythm implanted by Dr ELeonia Reeves generator change (MDT Adapta L) by Dr ARayann Heman5/21/13  . Colonoscopy  12/24/2011    Procedure: COLONOSCOPY;  Surgeon: NRogene Houston MD;  [facility];  Service: Endoscopy;  Laterality: N/A;  . Bilateral cataracts    . Battery changed in pacemaker    . Laparoscopic right hemi colectomy  02/19/2012    Procedure: LAPAROSCOPIC RIGHT HEMI COLECTOMY;  Surgeon: EGayland Curry MD,FACS;  [facility]  Service: General;  Laterality: Right;  attempted laparoscopic assisted  right hemi colectomy converted to open.  . Partial colectomy  02/19/2012    Procedure: PARTIAL COLECTOMY;  Surgeon: EGayland Curry MD,FACS;  [facility] [facility]  Service: General;  Laterality: Right;  . Lysis of adhesion  02/19/2012    Procedure: LYSIS OF ADHESION;  Surgeon: EGayland Curry MD,FACS;  [place] [facility]  Service: General;  Laterality: N/A;  . Esophagogastroduodenoscopy N/A 11/28/2013    Procedure: ESOPHAGOGASTRODUODENOSCOPY (EGD);  Surgeon: MJeryl Columbia MD;  [facility];  Service: Endoscopy;  Laterality: N/A;  . Colonoscopy with propofol N/A 11/29/2013    Procedure: COLONOSCOPY WITH PROPOFOL;  Surgeon: RCleotis Nipper MD;  [place] [facility]  Service: Endoscopy;  Laterality: N/A;    SOCIAL HISTORY: History   Social History  . Marital Status: Widowed    Spouse Name: N/A  Number of Children: N/A  . Years of Education: N/A   Occupational History  . Not on file.   Social History Main Topics  . Smoking status: Never Smoker   . Smokeless tobacco: Never Used  . Alcohol Use: No  . Drug Use: No  . Sexual Activity: No   Other Topics Concern  . Not on file   Social History Narrative  . No narrative on file    FAMILY HISTORY: Family History  Problem Relation Age of Onset  . Anesthesia problems Neg Hx   . Hypotension Neg Hx   . Malignant hyperthermia Neg Hx   . Pseudochol deficiency Neg Hx   . Cancer Mother     ovarian    ALLERGIES:  is allergic to amiodarone; diovan; lasix; penicillins; and sulfa antibiotics.  MEDICATIONS:  Current Facility-Administered Medications  Medication Dose Route Frequency Provider Last Rate Last Dose  . 0.9 %  sodium chloride infusion   Intravenous Continuous Jonetta Osgood, MD 50 mL/hr at 11/29/13 2253    . diltiazem (CARDIZEM CD) 24 hr capsule 240 mg  240 mg Oral Daily Etta Quill, DO   240 mg at 12/01/13 0935  . famotidine (PEPCID) tablet 20 mg  20 mg Oral BID Etta Quill, DO   20 mg at 12/01/13 0936  .  magnesium hydroxide (MILK OF MAGNESIA) suspension 30 mL  30 mL Oral QHS Etta Quill, DO      . ondansetron [facility]) tablet 4 mg  4 mg Oral Q8H PRN Etta Quill, DO      . pantoprazole (PROTONIX) EC tablet 40 mg  40 mg Oral Daily Jonetta Osgood, MD   40 mg at 12/01/13 0935  . ramipril (ALTACE) capsule 10 mg  10 mg Oral Daily Etta Quill, DO   10 mg at 12/01/13 4854  . sodium chloride 0.9 % injection 3 mL  3 mL Intravenous Q12H Etta Quill, DO   3 mL at 11/29/13 2250  . sotalol (BETAPACE) tablet 240 mg  240 mg Oral BID Etta Quill, DO   240 mg at 12/01/13 6270    REVIEW OF SYSTEMS:   Constitutional: Denies fevers, chills or abnormal night sweats Eyes: Denies blurriness of vision, double vision or watery eyes Ears, nose, mouth, throat, and face: Denies mucositis or sore throat Respiratory: Denies cough, dyspnea or wheezes Cardiovascular: Denies palpitation, chest discomfort or lower extremity swelling Gastrointestinal:  Denies nausea, heartburn or change in bowel habits Skin: Denies abnormal skin rashes Lymphatics: Denies new lymphadenopathy or easy bruising Neurological:Denies numbness, tingling or new weaknesses Behavioral/Psych: Mood is stable, no new changes  All other systems were reviewed with the patient and are negative.  PHYSICAL EXAMINATION: ECOG PERFORMANCE STATUS: 1-2  Filed Vitals:   12/01/13 1348  BP: 152/58  Pulse: 60  Temp: 99.6 F (37.6 C)  Resp: 16   Filed Weights   11/27/13 1626 11/27/13 2300  Weight: 195 lb (88.451 kg) 192 lb 10.9 oz (87.4 kg)    GENERAL:alert, no distress and comfortable SKIN: skin color, texture, turgor are normal, no rashes or significant lesions EYES: normal, conjunctiva are pink and non-injected, sclera clear OROPHARYNX:no exudate, no erythema and lips, buccal mucosa, and tongue normal  NECK: supple, thyroid normal size, non-tender, without nodularity LYMPH:  no palpable lymphadenopathy in the cervical, axillary or  inguinal LUNGS: clear to auscultation and percussion with normal breathing effort HEART: regular rate & rhythm and no murmurs and no lower extremity edema  ABDOMEN:abdomen soft, non-tender and normal bowel sounds Musculoskeletal:no cyanosis of digits and no clubbing  PSYCH: alert & oriented x 3 with fluent speech NEURO: no focal motor/sensory deficits  LABORATORY DATA:  I have reviewed the data as listed Lab Results  Component Value Date   WBC 5.7 12/01/2013   HGB 8.6* 12/01/2013   HCT 28.1* 12/01/2013   MCV 81.9 12/01/2013   PLT 216 12/01/2013    Recent Labs  11/21/13 1142 11/24/13 1429  11/27/13 1635 11/28/13 0623 11/29/13 0432 11/30/13 0736  NA 145* 138  < > 141 143 142 143  K 4.5 4.6  --  4.8 4.4 3.8 3.9  CL 102 98  --  102 108 106 107  CO2 26 27  --  '27 25 25 25  ' GLUCOSE 130* 110*  < > 100* 92 84 88  BUN 18 16  < > '18 17 12 7  ' CREATININE 0.90 1.05*  --  0.85 0.92 0.89 0.93  CALCIUM 9.2 8.9  --  9.4 8.6 8.7 8.7  GFRNONAA 57* 47*  --  59* 54* 56* 53*  GFRAA 66 54*  --  68* 62* 65* 61*  PROT 6.7 6.8  --  6.9  --   --   --   ALBUMIN  --   --   --  3.5  --   --   --   AST 16 17  --  20  --   --   --   ALT 10 8  --  11  --   --   --   ALKPHOS 69 69  --  71  --   --   --   BILITOT 0.3 0.3  --  0.3  --   --   --   < > = values in this interval not displayed.  RADIOGRAPHIC STUDIES: I have personally reviewed the radiological images as listed and agreed with the findings in the report.  Dg Abd 1 View  11/24/2013   CLINICAL DATA:  Nausea and vomiting.  EXAM: ABDOMEN - 1 VIEW  COMPARISON:  04/22/2011.  FINDINGS: Moderate stool burden. Gaseous distention of the colon is present. Surgical clips are present, likely representing lymphadenectomy. Partially visualized pacemaker leads. Cardiopericardial silhouette is partially visualized and appears enlarged. No gross plain film evidence of free air.  IMPRESSION: Gaseous distension of colon.  Moderate stool burden.   Electronically  Signed   By: Dereck Ligas M.D.   On: 11/24/2013 15:33   Ct Chest W Contrast  11/30/2013   CLINICAL DATA:  Patient with history of recurrent colon cancer. Evaluate for metastatic disease.  EXAM: CT CHEST, ABDOMEN, AND PELVIS WITH CONTRAST  TECHNIQUE: Multidetector CT imaging of the chest, abdomen and pelvis was performed following the standard protocol during bolus administration of intravenous contrast.  CONTRAST:  161m OMNIPAQUE IOHEXOL 300 MG/ML  SOLN  COMPARISON:  CT 01/04/2012  FINDINGS: CT CHEST FINDINGS  Visualized thyroid is unremarkable. Normal heart size. No pericardial effusion. 1.2 cm superior mediastinal lymph node (image 13; series 2). Prominent pretracheal lymph nodes measuring up to 0.8 cm (image 21; series 2).  The central airways are patent. There is a 2.2 x 2.3 cm nodule within the superior segment of left lower lobe (image 31; series 3). There is a 3.8 x 3.3 cm mass within the superior segment of the right lower lobe (image 29; series 3). There is a 5 mm superior segment right lower lobe nodule slightly increased from prior (image  27; series 3). There is a new 3 mm nodule within the right upper lobe (image 12; series 3). No pleural effusion or pneumothorax. Scarring and or atelectasis within the right middle lobe and lingula.  CT ABDOMEN AND PELVIS FINDINGS  There is a stable 5 mm low-attenuation lesion within the right hepatic lobe, too small to characterize however likely benign given the stability. No additional hepatic lesions are identified. Gallbladder is unremarkable. The spleen, pancreas and bilateral adrenal glands are unremarkable.  There is a small hiatal hernia. Simple cyst within the inferior pole of the right kidney. The kidneys enhance symmetrically with contrast. There is no hydronephrosis. Urinary bladder is unremarkable.  Normal caliber abdominal aorta. No retroperitoneal lymphadenopathy. Postsurgical changes compatible with prior sigmoid resection. When compared to prior  CT there is increased presacral soft tissue thickening and perirectal soft tissue stranding. There is masslike thickening along the anterior aspect of the cecum measuring 3.6 x 2.7 cm. No evidence for pericecal lymphadenopathy.  There is a 2.5 x 2.3 cm soft tissue mass within the anterior abdominal wall to the right aspect of the midline surgical scar (image 61; series 2). There are a few focally dilated loops of small bowel within the central abdomen.  Lower lumbar spine degenerative changes. No aggressive or acute appearing osseous lesions.  IMPRESSION: 1. Right lower lobe pulmonary mass and left lower lobe pulmonary nodule concerning for pulmonary metastatic disease. Additionally there is superior mediastinal adenopathy concerning for metastatic adenopathy. 2. Masslike thickening along the anterior aspect of the cecum most compatible with adenocarcinoma at the ileocecal location. 3. Interval increase in presacral soft tissue thickening and perirectal soft tissue stranding. Local regional recurrence at this location is not excluded. 4. Soft tissue mass within the anterior abdominal wall may be postoperative in etiology however metastatic implant is of concern. 5. Few dilated loops of small bowel within the central abdomen may represent focal ileus.   Electronically Signed   By: Lovey Newcomer M.D.   On: 11/30/2013 20:46   Ct Abdomen Pelvis W Contrast  11/30/2013   CLINICAL DATA:  Patient with history of recurrent colon cancer. Evaluate for metastatic disease.  EXAM: CT CHEST, ABDOMEN, AND PELVIS WITH CONTRAST  TECHNIQUE: Multidetector CT imaging of the chest, abdomen and pelvis was performed following the standard protocol during bolus administration of intravenous contrast.  CONTRAST:  113m OMNIPAQUE IOHEXOL 300 MG/ML  SOLN  COMPARISON:  CT 01/04/2012  FINDINGS: CT CHEST FINDINGS  Visualized thyroid is unremarkable. Normal heart size. No pericardial effusion. 1.2 cm superior mediastinal lymph node (image 13;  series 2). Prominent pretracheal lymph nodes measuring up to 0.8 cm (image 21; series 2).  The central airways are patent. There is a 2.2 x 2.3 cm nodule within the superior segment of left lower lobe (image 31; series 3). There is a 3.8 x 3.3 cm mass within the superior segment of the right lower lobe (image 29; series 3). There is a 5 mm superior segment right lower lobe nodule slightly increased from prior (image 27; series 3). There is a new 3 mm nodule within the right upper lobe (image 12; series 3). No pleural effusion or pneumothorax. Scarring and or atelectasis within the right middle lobe and lingula.  CT ABDOMEN AND PELVIS FINDINGS  There is a stable 5 mm low-attenuation lesion within the right hepatic lobe, too small to characterize however likely benign given the stability. No additional hepatic lesions are identified. Gallbladder is unremarkable. The spleen, pancreas and bilateral adrenal glands  are unremarkable.  There is a small hiatal hernia. Simple cyst within the inferior pole of the right kidney. The kidneys enhance symmetrically with contrast. There is no hydronephrosis. Urinary bladder is unremarkable.  Normal caliber abdominal aorta. No retroperitoneal lymphadenopathy. Postsurgical changes compatible with prior sigmoid resection. When compared to prior CT there is increased presacral soft tissue thickening and perirectal soft tissue stranding. There is masslike thickening along the anterior aspect of the cecum measuring 3.6 x 2.7 cm. No evidence for pericecal lymphadenopathy.  There is a 2.5 x 2.3 cm soft tissue mass within the anterior abdominal wall to the right aspect of the midline surgical scar (image 61; series 2). There are a few focally dilated loops of small bowel within the central abdomen.  Lower lumbar spine degenerative changes. No aggressive or acute appearing osseous lesions.  IMPRESSION: 1. Right lower lobe pulmonary mass and left lower lobe pulmonary nodule concerning for  pulmonary metastatic disease. Additionally there is superior mediastinal adenopathy concerning for metastatic adenopathy. 2. Masslike thickening along the anterior aspect of the cecum most compatible with adenocarcinoma at the ileocecal location. 3. Interval increase in presacral soft tissue thickening and perirectal soft tissue stranding. Local regional recurrence at this location is not excluded. 4. Soft tissue mass within the anterior abdominal wall may be postoperative in etiology however metastatic implant is of concern. 5. Few dilated loops of small bowel within the central abdomen may represent focal ileus.   Electronically Signed   By: Lovey Newcomer M.D.   On: 11/30/2013 20:46     All questions were answered. The patient knows to call the clinic with any problems, questions or concerns. I spent 60 minutes counseling the patient face to face. The total time spent in the appointment was 80 minutes.    Bernadene Bell, MD Medical Hematologist/Oncologist [place] Pager: [phone] Office No: [phone]

## 2013-12-01 NOTE — Progress Notes (Signed)
NURSING PROGRESS NOTE  [name] [id number] Discharge Data: 12/01/2013 6:37 PM Attending Provider: Phillips Climes, MD FYT:[id number], Elenore Rota, MD     [name] to be D/C'd Home per MD order.  Discussed with the patient the After Visit Summary and all questions fully answered. All IV's discontinued with no bleeding noted. All belongings returned to patient for patient to take home.   Last Vital Signs:  Blood pressure 152/58, pulse 60, temperature 99.6 F (37.6 C), temperature source Oral, resp. rate 16, height 5\' 6"  (1.676 m), weight 87.4 kg (192 lb 10.9 oz), SpO2 98.00%.  Discharge Medication List   Medication List    STOP taking these medications       apixaban 5 MG Tabs tablet  Commonly known as:  ELIQUIS      TAKE these medications       aspirin EC 81 MG tablet  Take 1 tablet (81 mg total) by mouth daily.  Start taking on:  12/05/2013     calcium-vitamin D 500-200 MG-UNIT per tablet  Commonly known as:  OSCAL WITH D  Take 1 tablet by mouth 2 (two) times daily.     diltiazem 180 MG 24 hr capsule  Commonly known as:  CARDIZEM CD  Take 180 mg by mouth daily.     famotidine 20 MG tablet  Commonly known as:  PEPCID  Take 20 mg by mouth 2 (two) times daily.     ferrous sulfate 325 (65 FE) MG tablet  Take 1 tablet (325 mg total) by mouth 3 (three) times daily with meals.     magnesium hydroxide 400 MG/5ML suspension  Commonly known as:  MILK OF MAGNESIA  Take 30 mLs by mouth at bedtime.     ondansetron 4 MG tablet  Commonly known as:  ZOFRAN  Take 1 tablet (4 mg total) by mouth every 8 (eight) hours as needed for nausea or vomiting.     pantoprazole 40 MG tablet  Commonly known as:  PROTONIX  Take 1 tablet (40 mg total) by mouth daily.     ramipril 10 MG capsule  Commonly known as:  ALTACE  Take 10 mg by mouth daily.     sotalol 120 MG tablet  Commonly known as:  BETAPACE  Take 240 mg by mouth 2 (two) times daily.

## 2013-12-01 NOTE — Progress Notes (Signed)
Recurrence within 2 years is concerning. She now has evidence of pulmonary masses worRisome for metastatic disease.  Need to assess need for anticoagulation in this setting since this may help decrease bleeding issues.  She is not obstructed at this point.  Need oncology input and she needs a PET scan. If she does have stage 4 colon,  Surgery will not prolong life.  If she can be treated medically with palliation of symptoms that would be best choice.  Can follow up with Redmond Pulling as outpatient.

## 2013-12-01 NOTE — Discharge Summary (Signed)
[name], 80 y.o., DOB [birth year], MRN [id number]. Admission date: 11/27/2013 Discharge Date 12/01/2013 Primary MD [name], MD Admitting Physician Etta Quill, DO  Admission Diagnosis  Acute blood loss anemia [285.1] Gastrointestinal hemorrhage, unspecified gastritis, unspecified gastrointestinal hemorrhage type [578.9]  Discharge Diagnosis   Principal Problem:   Colon cancer Active Problems:   Hypertension   Atrial fibrillation   GIB (gastrointestinal bleeding)   Acute blood loss anemia   Past Medical History  Diagnosis Date  . Diastolic heart failure   . Ovarian cancer   . Colon cancer   . Arthritis   . History of Clostridium difficile infection     in Jan 2013  . Pacemaker   . History of cellulitis and abscess   . Hypertension     takes Ramipril,Diltiazem daily  . Atrial fibrillation     takes Coumadin daily  . Tachy-brady syndrome 2008    s/p PPM implantation by Dr Oswald Hillock takes Betapace daily  . Osteoporosis     takes Prolia every 42months injections  . Shortness of breath     with exertion;Albuterol prn  . Bruises easily     takes COumadin daily  . GERD (gastroesophageal reflux disease)     takes Protonix daily  . Constipation     Milk of Mag nightly  . Anemia     takes Iron daily  . CHF (congestive heart failure)     Past Surgical History  Procedure Laterality Date  . Abdominal hysterectomy    . Oophorectomy    . Colonoscopy  03/30/2011    Procedure: COLONOSCOPY;  Surgeon: Missy Sabins, MD;  Location: [place];  Service: Endoscopy;  Laterality: N/A;  . Esophagogastroduodenoscopy  03/30/2011    Procedure: ESOPHAGOGASTRODUODENOSCOPY (EGD);  Surgeon: Missy Sabins, MD;  Location: [facility] ENDOSCOPY;  Service: Endoscopy;  Laterality: N/A;  . Colon resection  04/03/2011    Procedure: COLON RESECTION LAPAROSCOPIC;  Surgeon: Gayland Curry, MD;  Location: [place];  Service: General;  Laterality: N/A;  Laparoscopic, turned open sigmoid colon resection,  lysis of adhesions x 2.5 hours, rigid proctoscopy.  . Pacemaker placement  2008    MDT EnRhythm implanted by Dr Leonia Reeves, generator change (MDT Adapta L) by Dr Rayann Heman 08/04/11  . Colonoscopy  12/24/2011    Procedure: COLONOSCOPY;  Surgeon: Rogene Houston, MD;  [facility];  Service: Endoscopy;  Laterality: N/A;  . Bilateral cataracts    . Battery changed in pacemaker    . Laparoscopic right hemi colectomy  02/19/2012    Procedure: LAPAROSCOPIC RIGHT HEMI COLECTOMY;  Surgeon: Gayland Curry, MD,FACS;  Location: [place];  Service: General;  Laterality: Right;  attempted laparoscopic assisted right hemi colectomy converted to open.  . Partial colectomy  02/19/2012    Procedure: PARTIAL COLECTOMY;  Surgeon: Gayland Curry, MD,FACS;  Location: [place];  Service: General;  Laterality: Right;  . Lysis of adhesion  02/19/2012    Procedure: LYSIS OF ADHESION;  Surgeon: Gayland Curry, MD,FACS;  Location: [place];  Service: General;  Laterality: N/A;  . Esophagogastroduodenoscopy N/A 11/28/2013    Procedure: ESOPHAGOGASTRODUODENOSCOPY (EGD);  Surgeon: Jeryl Columbia, MD;  Location: [facility] END[facility]COPY;  Service: Endoscopy;  Laterality: N/A;  . Colonoscopy with propofol N/A 11/29/2013    Procedure: COLONOSCOPY WITH PROPOFOL;  Surgeon: Cleotis Nipper, MD;  Location: [place];  Service: Endoscopy;  Laterality: N/A;   HPI/Subjective:  [name] is a 80 y.o. Female with history of AFib, with  stroke in December, now on Eliquis, and colon cancer. She was sent to ED by her PCP with N,V and decreasing Hgb with suspected GI bleed, guaiac positive. She continues to have no complaints, and denies N, V, and abdominal pain. Last BM was 9/16. Had EGD 9/15 revealed no bleeding source ,colonoscopy on 9/17, with findings of colon mass, with pathology results showing adenocarcinoma with lymphovascular invasion, surgery were consulted, consult cardiology for preop evaluation, patient had CT chest abdomen and pelvis, which did show  possible metastasis to the lung, to surgical intervention was canceled, and oncology service were consulted.   Hospital Course See H&P, Labs, Consult and Test reports for all details in brief,    GI Bleed with acute blood loss anemia  - Admitted, kept n.p.o., Eliquis was stopped, GI consulted, EGD on 9/15 revealed no bleeding foci.  - Colonoscopy revealed friable mass adjacent to ileocolonic anastomosis, with pathology showing adenocarcinoma with lymphovascular invasion, surgery were consulted for further evaluation and possible need for resection. CT chest showing bilateral lung mass, most likely metastasis, so no surgical intervention at this point.  - Continue PPI, transfused 2 units of PRBC 9/15. Hb remains stable 8.6 at date of discharge. -Will continue to monitor Hgb, as an outpatient, will have CBC repeated on Monday, if stable can be resumed on baby aspirin, as high risk for recurrent bleed on Eliquis.  Recurrent: Adenocarcinoma with possible metastatic to lungs:  - oncology evaluation appreciated, they will arrange for follow up appointment in [facility].  Hypertension  - Stable at this time  - Cautiously continue Diltiazem, ramipril   Atrial Fibrillation  - Remains in sinus rhythm  - , continue sotalol and Cardizem , Eliquis was stopped as benefits of anticoagulation is out chatted by her bleeding, aspirin 81 mg daily and be resumed, by her PCP if her repeat hemoglobin, on September 21 is stable from her current baseline hemoglobin of 8.6.  Recent UTI  - Stop Macrodantin, no evidence of UTI at this time. Seems to have already completed 5 days of treatment.      Principal Problem:   Colon cancer Active Problems:   Hypertension   Atrial fibrillation   GIB (gastrointestinal bleeding)   Acute blood loss anemia     Consultants:               GI .           Surgery               Cardiology               oncology Procedures:  EGD 9/15  Colonoscopy  9/16     Significant Tests:  See full reports for all details    Dg Abd 1 View  11/24/2013   CLINICAL DATA:  Nausea and vomiting.  EXAM: ABDOMEN - 1 VIEW  COMPARISON:  04/22/2011.  FINDINGS: Moderate stool burden. Gaseous distention of the colon is present. Surgical clips are present, likely representing lymphadenectomy. Partially visualized pacemaker leads. Cardiopericardial silhouette is partially visualized and appears enlarged. No gross plain film evidence of free air.  IMPRESSION: Gaseous distension of colon.  Moderate stool burden.   Electronically Signed   By: Dereck Ligas M.D.   On: 11/24/2013 15:33   Ct Chest W Contrast  11/30/2013   CLINICAL DATA:  Patient with history of recurrent colon cancer. Evaluate for metastatic disease.  EXAM: CT CHEST, ABDOMEN, AND PELVIS WITH CONTRAST  TECHNIQUE: Multidetector CT imaging of the  chest, abdomen and pelvis was performed following the standard protocol during bolus administration of intravenous contrast.  CONTRAST:  139mL OMNIPAQUE IOHEXOL 300 MG/ML  SOLN  COMPARISON:  CT 01/04/2012  FINDINGS: CT CHEST FINDINGS  Visualized thyroid is unremarkable. Normal heart size. No pericardial effusion. 1.2 cm superior mediastinal lymph node (image 13; series 2). Prominent pretracheal lymph nodes measuring up to 0.8 cm (image 21; series 2).  The central airways are patent. There is a 2.2 x 2.3 cm nodule within the superior segment of left lower lobe (image 31; series 3). There is a 3.8 x 3.3 cm mass within the superior segment of the right lower lobe (image 29; series 3). There is a 5 mm superior segment right lower lobe nodule slightly increased from prior (image 27; series 3). There is a new 3 mm nodule within the right upper lobe (image 12; series 3). No pleural effusion or pneumothorax. Scarring and or atelectasis within the right middle lobe and lingula.  CT ABDOMEN AND PELVIS FINDINGS  There is a stable 5 mm low-attenuation lesion within the right hepatic  lobe, too small to characterize however likely benign given the stability. No additional hepatic lesions are identified. Gallbladder is unremarkable. The spleen, pancreas and bilateral adrenal glands are unremarkable.  There is a small hiatal hernia. Simple cyst within the inferior pole of the right kidney. The kidneys enhance symmetrically with contrast. There is no hydronephrosis. Urinary bladder is unremarkable.  Normal caliber abdominal aorta. No retroperitoneal lymphadenopathy. Postsurgical changes compatible with prior sigmoid resection. When compared to prior CT there is increased presacral soft tissue thickening and perirectal soft tissue stranding. There is masslike thickening along the anterior aspect of the cecum measuring 3.6 x 2.7 cm. No evidence for pericecal lymphadenopathy.  There is a 2.5 x 2.3 cm soft tissue mass within the anterior abdominal wall to the right aspect of the midline surgical scar (image 61; series 2). There are a few focally dilated loops of small bowel within the central abdomen.  Lower lumbar spine degenerative changes. No aggressive or acute appearing osseous lesions.  IMPRESSION: 1. Right lower lobe pulmonary mass and left lower lobe pulmonary nodule concerning for pulmonary metastatic disease. Additionally there is superior mediastinal adenopathy concerning for metastatic adenopathy. 2. Masslike thickening along the anterior aspect of the cecum most compatible with adenocarcinoma at the ileocecal location. 3. Interval increase in presacral soft tissue thickening and perirectal soft tissue stranding. Local regional recurrence at this location is not excluded. 4. Soft tissue mass within the anterior abdominal wall may be postoperative in etiology however metastatic implant is of concern. 5. Few dilated loops of small bowel within the central abdomen may represent focal ileus.   Electronically Signed   By: Lovey Newcomer M.D.   On: 11/30/2013 20:46   Ct Abdomen Pelvis W  Contrast  11/30/2013   CLINICAL DATA:  Patient with history of recurrent colon cancer. Evaluate for metastatic disease.  EXAM: CT CHEST, ABDOMEN, AND PELVIS WITH CONTRAST  TECHNIQUE: Multidetector CT imaging of the chest, abdomen and pelvis was performed following the standard protocol during bolus administration of intravenous contrast.  CONTRAST:  118mL OMNIPAQUE IOHEXOL 300 MG/ML  SOLN  COMPARISON:  CT 01/04/2012  FINDINGS: CT CHEST FINDINGS  Visualized thyroid is unremarkable. Normal heart size. No pericardial effusion. 1.2 cm superior mediastinal lymph node (image 13; series 2). Prominent pretracheal lymph nodes measuring up to 0.8 cm (image 21; series 2).  The central airways are patent. There is a 2.2 x  2.3 cm nodule within the superior segment of left lower lobe (image 31; series 3). There is a 3.8 x 3.3 cm mass within the superior segment of the right lower lobe (image 29; series 3). There is a 5 mm superior segment right lower lobe nodule slightly increased from prior (image 27; series 3). There is a new 3 mm nodule within the right upper lobe (image 12; series 3). No pleural effusion or pneumothorax. Scarring and or atelectasis within the right middle lobe and lingula.  CT ABDOMEN AND PELVIS FINDINGS  There is a stable 5 mm low-attenuation lesion within the right hepatic lobe, too small to characterize however likely benign given the stability. No additional hepatic lesions are identified. Gallbladder is unremarkable. The spleen, pancreas and bilateral adrenal glands are unremarkable.  There is a small hiatal hernia. Simple cyst within the inferior pole of the right kidney. The kidneys enhance symmetrically with contrast. There is no hydronephrosis. Urinary bladder is unremarkable.  Normal caliber abdominal aorta. No retroperitoneal lymphadenopathy. Postsurgical changes compatible with prior sigmoid resection. When compared to prior CT there is increased presacral soft tissue thickening and perirectal  soft tissue stranding. There is masslike thickening along the anterior aspect of the cecum measuring 3.6 x 2.7 cm. No evidence for pericecal lymphadenopathy.  There is a 2.5 x 2.3 cm soft tissue mass within the anterior abdominal wall to the right aspect of the midline surgical scar (image 61; series 2). There are a few focally dilated loops of small bowel within the central abdomen.  Lower lumbar spine degenerative changes. No aggressive or acute appearing osseous lesions.  IMPRESSION: 1. Right lower lobe pulmonary mass and left lower lobe pulmonary nodule concerning for pulmonary metastatic disease. Additionally there is superior mediastinal adenopathy concerning for metastatic adenopathy. 2. Masslike thickening along the anterior aspect of the cecum most compatible with adenocarcinoma at the ileocecal location. 3. Interval increase in presacral soft tissue thickening and perirectal soft tissue stranding. Local regional recurrence at this location is not excluded. 4. Soft tissue mass within the anterior abdominal wall may be postoperative in etiology however metastatic implant is of concern. 5. Few dilated loops of small bowel within the central abdomen may represent focal ileus.   Electronically Signed   By: Lovey Newcomer M.D.   On: 11/30/2013 20:46     Today   Subjective:   [name] today has no headache,no chest abdominal pain,no new weakness tingling or numbness, feels much better wants to go home today.   Objective:   Blood pressure 152/58, pulse 60, temperature 99.6 F (37.6 C), temperature source Oral, resp. rate 16, height 5\' 6"  (1.676 m), weight 87.4 kg (192 lb 10.9 oz), SpO2 98.00%.  Intake/Output Summary (Last 24 hours) at 12/01/13 1634 Last data filed at 12/01/13 1047  Gross per 24 hour  Intake 4414.17 ml  Output    900 ml  Net 3514.17 ml    Exam Awake Alert, Oriented 3, No new F.N deficits, Normal affect [place].AT,PERRAL Supple Neck,No JVD, No cervical lymphadenopathy  appriciated.  Symmetrical Chest wall movement, Good air movement bilaterally, CTAB RRR,No Gallops,Rubs or new Murmurs, No Parasternal Heave +ve B.Sounds, Abd Soft, Non tender, No organomegaly appriciated, No rebound -guarding or rigidity. No Cyanosis, Clubbing or edema, No new Rash or bruise  Data Review   Cultures   CBC w Diff: Lab Results  Component Value Date   WBC 5.7 12/01/2013   WBC 6.6 11/24/2013   WBC 5.8 09/18/2013   WBC 6.1 12/26/2008  HGB 8.6* 12/01/2013   HGB 7.3* 11/27/2013   HGB 13.3 12/26/2008   HCT 28.1* 12/01/2013   HCT 25.0* 11/24/2013   HCT 38.8 12/26/2008   PLT 216 12/01/2013   PLT 196 12/26/2008   LYMPHOPCT 19 11/27/2013   LYMPHOPCT 16.8 12/26/2008   MONOPCT 10 11/27/2013   MONOPCT 9.0 12/26/2008   EOSPCT 1 11/27/2013   EOSPCT 0.8 12/26/2008   BASOPCT 0 11/27/2013   BASOPCT 0.5 12/26/2008   CMP: Lab Results  Component Value Date   NA 143 11/30/2013   NA 138 11/24/2013   K 3.9 11/30/2013   CL 107 11/30/2013   CO2 25 11/30/2013   BUN 7 11/30/2013   BUN 16 11/24/2013   CREATININE 0.93 11/30/2013   PROT 6.9 11/27/2013   PROT 6.8 11/24/2013   ALBUMIN 3.5 11/27/2013   BILITOT 0.3 11/27/2013   ALKPHOS 71 11/27/2013   AST 20 11/27/2013   ALT 11 11/27/2013  .  Micro Results Recent Results (from the past 240 hour(s))  FECAL OCCULT BLOOD, IMMUNOCHEMICAL     Status: Abnormal   Collection Time    11/22/13 12:00 AM      Result Value Ref Range Status   Fecal Occult Bld Positive (*) Negative Final     Discharge Instructions       Discharge Medications     Medication List    ASK your doctor about these medications       apixaban 5 MG Tabs tablet  Commonly known as:  ELIQUIS  Take 1 tablet (5 mg total) by mouth 2 (two) times daily.     calcium-vitamin D 500-200 MG-UNIT per tablet  Commonly known as:  OSCAL WITH D  Take 1 tablet by mouth 2 (two) times daily.     diltiazem 180 MG 24 hr capsule  Commonly known as:  CARDIZEM CD  Take 180 mg by mouth daily.      famotidine 20 MG tablet  Commonly known as:  PEPCID  Take 20 mg by mouth 2 (two) times daily.     ferrous sulfate 325 (65 FE) MG tablet  Take 1 tablet (325 mg total) by mouth 3 (three) times daily with meals.     magnesium hydroxide 400 MG/5ML suspension  Commonly known as:  MILK OF MAGNESIA  Take 30 mLs by mouth at bedtime.     ondansetron 4 MG tablet  Commonly known as:  ZOFRAN  Take 1 tablet (4 mg total) by mouth every 8 (eight) hours as needed for nausea or vomiting.     pantoprazole 40 MG tablet  Commonly known as:  PROTONIX  Take 1 tablet (40 mg total) by mouth daily.     ramipril 10 MG capsule  Commonly known as:  ALTACE  Take 10 mg by mouth daily.     sotalol 120 MG tablet  Commonly known as:  BETAPACE  Take 240 mg by mouth 2 (two) times daily.         Total Time in preparing paper work, data evaluation and todays exam - 35 minutes  Tayshaun Kroh M.D on 12/01/2013 at 4:34 PM  [place]  [phone]

## 2013-12-01 NOTE — Progress Notes (Signed)
PROGRESS NOTE  [name]:[id number] DOB: 1922-10-29 DOA: 11/27/2013 PCP: [name], MD  HPI/Subjective: [name] is a 80 y.o. Female with history of AFib, with stroke in December, now on Eliquis, and colon cancer. She was sent to ED by her PCP with N,V and decreasing Hgb with suspected GI bleed, guaiac positive. She continues to have no complaints, and denies N, V, and abdominal pain. Last BM was 9/16. Had EGD 9/15 revealed no bleeding source ,colonoscopy on 9/17, with findings of colon mass, with pathology results showing adenocarcinoma with lymphovascular invasion, surgery were consulted, consult cardiology for preop evaluation, patient had CT chest abdomen and pelvis, which did show possible metastasis to the lung, to surgical intervention was canceled, and oncology service were consulted.   Assessment/Plan:  GI Bleed with acute blood loss anemia  - Admitted, kept n.p.o., Eliquis on hold for now, GI consulted, EGD on 9/15 revealed no bleeding foci. - Colonoscopy revealed friable mass adjacent to ileocolonic anastomosis, with pathology showing adenocarcinoma with lymphovascular invasion, surgery were consulted for further evaluation and possible need for resection. CT chest showing bilateral lung mass, most likely metastasis, so no surgical intervention at this point. - Continue PPI, transfused 2 units of PRBC 9/15. Hb remains stable today.  -Will continue to monitor Hgb.   Recurrent: Adenocarcinoma with possible metastatic to lungs: -Await oncology evaluation.  Hypertension  - Stable at this time  - Cautiously continue Diltiazem, ramipril   Atrial Fibrillation  - Remains in sinus rhythm  - Hold Eliquis at this time, continue sotalol and Cardizem   Recent UTI  - Stop Macrodantin, no evidence of UTI at this time. Seems to have already completed 5 days of treatment.    History of CVA  - Nonfocal exam.  - Eliquis on hold given ongoing GI bleeding , high risk for  recurrent bleeding, so will be hold on discharge.     DVT Prophylaxis:  SCDs   Code Status: DNR Family Communication: Patient alert and oriented, discussed with daughter at bedside Disposition Plan:  Remain inpatient at this time  Consultants:  GI .    Surgery      Cardiology Procedures:  EGD 9/15  Colonoscopy 9/16  Antibiotics:  None  Objective: Filed Vitals:   11/30/13 1440 11/30/13 2202 11/30/13 2348 12/01/13 0519  BP: 132/66 161/68 139/56 136/57  Pulse: 59 57 60 65  Temp: 98.3 F (36.8 C) 98.7 F (37.1 C)  98.6 F (37 C)  TempSrc: Oral Oral  Oral  Resp: 18 17  18   Height:      Weight:      SpO2: 100% 97%  94%    Intake/Output Summary (Last 24 hours) at 12/01/13 1144 Last data filed at 12/01/13 1047  Gross per 24 hour  Intake 4414.17 ml  Output    900 ml  Net 3514.17 ml   Filed Weights   11/27/13 1626 11/27/13 2300  Weight: 88.451 kg (195 lb) 87.4 kg (192 lb 10.9 oz)    Exam: General: Well developed, well nourished, asleep upon entry to room, NAD Cardiovascular: RRR, S1 S2 auscultated, no rubs, clicks, murmurs or gallops.   Respiratory: Clear to auscultation bilaterally without wheezes, rales or rhonchi Abdomen: Soft, nontender, nondistended, + bowel sounds .  Neuro: Alert and oriented, cranial nerves grossly intact.  Psych: Patient is pleasant, happy, and appreciative.   Data Reviewed: Basic Metabolic Panel:  Recent Labs Lab 11/24/13 1429 11/27/13 1635 11/28/13 0102 11/29/13 0432 11/30/13 0736  NA  138 141 143 142 143  K 4.6 4.8 4.4 3.8 3.9  CL 98 102 108 106 107  CO2 27 27 25 25 25   GLUCOSE 110* 100* 92 84 88  BUN 16 18 17 12 7   CREATININE 1.05* 0.85 0.92 0.89 0.93  CALCIUM 8.9 9.4 8.6 8.7 8.7   Liver Function Tests:  Recent Labs Lab 11/24/13 1429 11/27/13 1635  AST 17 20  ALT 8 11  ALKPHOS 69 71  BILITOT 0.3 0.3  PROT 6.8 6.9  ALBUMIN  --  3.5   CBC:  Recent Labs Lab 11/27/13 1635 11/28/13 0623 11/28/13 1810  11/29/13 0432 11/30/13 0736 12/01/13 0620  WBC 7.2 5.4 6.6 5.7 5.6 5.7  NEUTROABS 5.1  --   --   --   --   --   HGB 7.4* 6.8* 9.5* 9.0* 8.9* 8.6*  HCT 25.0* 22.7* 30.6* 28.9* 29.0* 28.1*  MCV 78.6 78.5 79.5 79.2 81.7 81.9  PLT 288 209 262 224 211 216     Recent Results (from the past 240 hour(s))  URINE CULTURE     Status: None   Collection Time    11/21/13  1:38 PM      Result Value Ref Range Status   Urine Culture, Routine Final report   Final   Result 1 Comment   Final   Comment: Mixed urogenital flora     Greater than 100,000 colony forming units per mL     Studies: Ct Chest W Contrast  11/30/2013   CLINICAL DATA:  Patient with history of recurrent colon cancer. Evaluate for metastatic disease.  EXAM: CT CHEST, ABDOMEN, AND PELVIS WITH CONTRAST  TECHNIQUE: Multidetector CT imaging of the chest, abdomen and pelvis was performed following the standard protocol during bolus administration of intravenous contrast.  CONTRAST:  134mL OMNIPAQUE IOHEXOL 300 MG/ML  SOLN  COMPARISON:  CT 01/04/2012  FINDINGS: CT CHEST FINDINGS  Visualized thyroid is unremarkable. Normal heart size. No pericardial effusion. 1.2 cm superior mediastinal lymph node (image 13; series 2). Prominent pretracheal lymph nodes measuring up to 0.8 cm (image 21; series 2).  The central airways are patent. There is a 2.2 x 2.3 cm nodule within the superior segment of left lower lobe (image 31; series 3). There is a 3.8 x 3.3 cm mass within the superior segment of the right lower lobe (image 29; series 3). There is a 5 mm superior segment right lower lobe nodule slightly increased from prior (image 27; series 3). There is a new 3 mm nodule within the right upper lobe (image 12; series 3). No pleural effusion or pneumothorax. Scarring and or atelectasis within the right middle lobe and lingula.  CT ABDOMEN AND PELVIS FINDINGS  There is a stable 5 mm low-attenuation lesion within the right hepatic lobe, too small to characterize  however likely benign given the stability. No additional hepatic lesions are identified. Gallbladder is unremarkable. The spleen, pancreas and bilateral adrenal glands are unremarkable.  There is a small hiatal hernia. Simple cyst within the inferior pole of the right kidney. The kidneys enhance symmetrically with contrast. There is no hydronephrosis. Urinary bladder is unremarkable.  Normal caliber abdominal aorta. No retroperitoneal lymphadenopathy. Postsurgical changes compatible with prior sigmoid resection. When compared to prior CT there is increased presacral soft tissue thickening and perirectal soft tissue stranding. There is masslike thickening along the anterior aspect of the cecum measuring 3.6 x 2.7 cm. No evidence for pericecal lymphadenopathy.  There is a 2.5 x 2.3 cm  soft tissue mass within the anterior abdominal wall to the right aspect of the midline surgical scar (image 61; series 2). There are a few focally dilated loops of small bowel within the central abdomen.  Lower lumbar spine degenerative changes. No aggressive or acute appearing osseous lesions.  IMPRESSION: 1. Right lower lobe pulmonary mass and left lower lobe pulmonary nodule concerning for pulmonary metastatic disease. Additionally there is superior mediastinal adenopathy concerning for metastatic adenopathy. 2. Masslike thickening along the anterior aspect of the cecum most compatible with adenocarcinoma at the ileocecal location. 3. Interval increase in presacral soft tissue thickening and perirectal soft tissue stranding. Local regional recurrence at this location is not excluded. 4. Soft tissue mass within the anterior abdominal wall may be postoperative in etiology however metastatic implant is of concern. 5. Few dilated loops of small bowel within the central abdomen may represent focal ileus.   Electronically Signed   By: Lovey Newcomer M.D.   On: 11/30/2013 20:46   Ct Abdomen Pelvis W Contrast  11/30/2013   CLINICAL DATA:   Patient with history of recurrent colon cancer. Evaluate for metastatic disease.  EXAM: CT CHEST, ABDOMEN, AND PELVIS WITH CONTRAST  TECHNIQUE: Multidetector CT imaging of the chest, abdomen and pelvis was performed following the standard protocol during bolus administration of intravenous contrast.  CONTRAST:  129mL OMNIPAQUE IOHEXOL 300 MG/ML  SOLN  COMPARISON:  CT 01/04/2012  FINDINGS: CT CHEST FINDINGS  Visualized thyroid is unremarkable. Normal heart size. No pericardial effusion. 1.2 cm superior mediastinal lymph node (image 13; series 2). Prominent pretracheal lymph nodes measuring up to 0.8 cm (image 21; series 2).  The central airways are patent. There is a 2.2 x 2.3 cm nodule within the superior segment of left lower lobe (image 31; series 3). There is a 3.8 x 3.3 cm mass within the superior segment of the right lower lobe (image 29; series 3). There is a 5 mm superior segment right lower lobe nodule slightly increased from prior (image 27; series 3). There is a new 3 mm nodule within the right upper lobe (image 12; series 3). No pleural effusion or pneumothorax. Scarring and or atelectasis within the right middle lobe and lingula.  CT ABDOMEN AND PELVIS FINDINGS  There is a stable 5 mm low-attenuation lesion within the right hepatic lobe, too small to characterize however likely benign given the stability. No additional hepatic lesions are identified. Gallbladder is unremarkable. The spleen, pancreas and bilateral adrenal glands are unremarkable.  There is a small hiatal hernia. Simple cyst within the inferior pole of the right kidney. The kidneys enhance symmetrically with contrast. There is no hydronephrosis. Urinary bladder is unremarkable.  Normal caliber abdominal aorta. No retroperitoneal lymphadenopathy. Postsurgical changes compatible with prior sigmoid resection. When compared to prior CT there is increased presacral soft tissue thickening and perirectal soft tissue stranding. There is masslike  thickening along the anterior aspect of the cecum measuring 3.6 x 2.7 cm. No evidence for pericecal lymphadenopathy.  There is a 2.5 x 2.3 cm soft tissue mass within the anterior abdominal wall to the right aspect of the midline surgical scar (image 61; series 2). There are a few focally dilated loops of small bowel within the central abdomen.  Lower lumbar spine degenerative changes. No aggressive or acute appearing osseous lesions.  IMPRESSION: 1. Right lower lobe pulmonary mass and left lower lobe pulmonary nodule concerning for pulmonary metastatic disease. Additionally there is superior mediastinal adenopathy concerning for metastatic adenopathy. 2. Masslike thickening along  the anterior aspect of the cecum most compatible with adenocarcinoma at the ileocecal location. 3. Interval increase in presacral soft tissue thickening and perirectal soft tissue stranding. Local regional recurrence at this location is not excluded. 4. Soft tissue mass within the anterior abdominal wall may be postoperative in etiology however metastatic implant is of concern. 5. Few dilated loops of small bowel within the central abdomen may represent focal ileus.   Electronically Signed   By: Lovey Newcomer M.D.   On: 11/30/2013 20:46    Scheduled Meds: . diltiazem  240 mg Oral Daily  . famotidine  20 mg Oral BID  . magnesium hydroxide  30 mL Oral QHS  . pantoprazole  40 mg Oral Daily  . ramipril  10 mg Oral Daily  . sodium chloride  3 mL Intravenous Q12H  . sotalol  240 mg Oral BID   Continuous Infusions: . sodium chloride 50 mL/hr at 11/29/13 2253    Principal Problem:   GIB (gastrointestinal bleeding) Active Problems:   Hypertension   Atrial fibrillation   Acute blood loss anemia     Phillips Climes MD  [facility] Pager [phone]. If 7PM-7AM, please contact night-coverage at [email or web address], password [facility] 12/01/2013, 11:44 AM  LOS: 4 days

## 2013-12-01 NOTE — Evaluation (Signed)
Physical Therapy Evaluation Patient Details Name: [name] MRN: [id number] DOB: 12/23/1922 Today's Date: 12/01/2013   History of Present Illness  80 yo WF who has multiple medical problems with prior colon cancer.  She had an initial sigmoid colectomy in February 2013 for an obstructing colon cancer.  About 10 months later she was found to have a synchronous lesion in her cecum.  Dr. Redmond Pulling went back in in December of 2013 and performed a right colectomy. She had a CVA at rehab and was put on Eliquis during this time as well.  She lives independently, but has someone stay with her at night and a helper 3 days a week in the morning.    Clinical Impression  Pt adm due to above. Presents with decreased functional mobility secondary to deficits indicated below (see PT problem list). Pt reports she will have 24/7 (A) upon D/C home. Recommend use of RW upon D/C home for incr balance and HHPT. Pt to benefit from skilled acute PT to address deficits and maximize functional mobility prior to D/c home.     Follow Up Recommendations Home health PT;Supervision/Assistance - 24 hour    Equipment Recommendations  None recommended by PT    Recommendations for Other Services       Precautions / Restrictions Precautions Precautions: Fall Restrictions Weight Bearing Restrictions: No      Mobility  Bed Mobility               General bed mobility comments: not addressed; pt up in chair and returned to chair   Transfers Overall transfer level: Needs assistance Equipment used: Straight cane Transfers: Sit to/from Stand Sit to Stand: Min guard         General transfer comment: min guard to steady; cues for safety and hand placement   Ambulation/Gait Ambulation/Gait assistance: Min guard Ambulation Distance (Feet): 150 Feet Assistive device: Straight cane Gait Pattern/deviations: Antalgic;Decreased stance time - right;Decreased weight shift to right;Decreased dorsiflexion -  right;Narrow base of support;Trunk flexed Gait velocity: decr Gait velocity interpretation: Below normal speed for age/gender General Gait Details: pt fatigued quickly; required standing rest break x 2; will benefit from use of RW for more stability; obvious weakness in Rt LE with fatigue   Stairs            Wheelchair Mobility    Modified Rankin (Stroke Patients Only)       Balance Overall balance assessment: Needs assistance Sitting-balance support: Feet supported;No upper extremity supported Sitting balance-Leahy Scale: Fair     Standing balance support: During functional activity;Single extremity supported Standing balance-Leahy Scale: Poor Standing balance comment: relies on UE support at all times                              Pertinent Vitals/Pain Pain Assessment: No/denies pain    Home Living Family/patient expects to be discharged to:: Private residence Living Arrangements: Alone Available Help at Discharge: Family;Available 24 hours/day Type of Home: House Home Access: Stairs to enter Entrance Stairs-Rails: None Entrance Stairs-Number of Steps: 1 Home Layout: One level Home Equipment: Walker - 2 wheels;Cane - single point Additional Comments: caretaker 3 days a week for "a few hours"     Prior Function Level of Independence: Needs assistance   Gait / Transfers Assistance Needed: ambulatory with cane  ADL's / Halfway Needed: (A) 3 days a week for bathing   Comments: pt reports she willhave 24/7 (A) upon  D/C      Hand Dominance        Extremity/Trunk Assessment   Upper Extremity Assessment: Defer to OT evaluation           Lower Extremity Assessment: Generalized weakness      Cervical / Trunk Assessment: Kyphotic  Communication   Communication: No difficulties  Cognition Arousal/Alertness: Awake/alert Behavior During Therapy: WFL for tasks assessed/performed Overall Cognitive Status: Within Functional Limits  for tasks assessed                      General Comments General comments (skin integrity, edema, etc.): D/C planning with pt; pt reports she will have 24/7 (A)    Exercises        Assessment/Plan    PT Assessment Patient needs continued PT services  PT Diagnosis Difficulty walking;Generalized weakness   PT Problem List Decreased strength;Decreased activity tolerance;Decreased balance;Decreased mobility  PT Treatment Interventions DME instruction;Gait training;Stair training;Functional mobility training;Therapeutic activities;Therapeutic exercise;Balance training;Neuromuscular re-education;Patient/family education   PT Goals (Current goals can be found in the Care Plan section) Acute Rehab PT Goals Patient Stated Goal: to get walking more PT Goal Formulation: With patient Time For Goal Achievement: 12/08/13 Potential to Achieve Goals: Good    Frequency Min 3X/week   Barriers to discharge Decreased caregiver support lives alone but reports will have 24/7 (A0    Co-evaluation               End of Session Equipment Utilized During Treatment: Gait belt Activity Tolerance: Patient tolerated treatment well Patient left: in chair;with call bell/phone within reach;with chair alarm set Nurse Communication: Mobility status         Time: 4287-6811 PT Time Calculation (min): 21 min   Charges:   PT Evaluation $Initial PT Evaluation Tier I: 1 Procedure PT Treatments $Gait Training: 8-22 mins   PT G CodesGustavus Bryant, [place]  [phone] 12/01/2013, 11:59 AM

## 2013-12-01 NOTE — Progress Notes (Signed)
[facility] Progress Note  2 Days Post-Op  Subjective: No N/V or abdominal pain.  No rectal bleeding.  Feels fine.  Having BM's and flatus.  Ambulating well through the halls.   Objective: Vital signs in last 24 hours: Temp:  [98.3 F (36.8 C)-98.7 F (37.1 C)] 98.6 F (37 C) (09/18 0519) Pulse Rate:  [57-65] 65 (09/18 0519) Resp:  [17-18] 18 (09/18 0519) BP: (132-161)/(56-68) 136/57 mmHg (09/18 0519) SpO2:  [94 %-100 %] 94 % (09/18 0519) Last BM Date: 11/30/13  Intake/Output from previous day: 09/17 0701 - 09/18 0700 In: 4414.2 [I.V.:4414.2] Out: 300 [Urine:300] Intake/Output this shift:    PE: Gen:  Alert, NAD, pleasant Abd: Soft, NT/ND, +BS, no HSM, abdominal scars noted   Lab Results:   Recent Labs  11/30/13 0736 12/01/13 0620  WBC 5.6 5.7  HGB 8.9* 8.6*  HCT 29.0* 28.1*  PLT 211 216   BMET  Recent Labs  11/29/13 0432 11/30/13 0736  NA 142 143  K 3.8 3.9  CL 106 107  CO2 25 25  GLUCOSE 84 88  BUN 12 7  CREATININE 0.89 0.93  CALCIUM 8.7 8.7   PT/INR No results found for this basename: LABPROT, INR,  in the last 72 hours CMP     Component Value Date/Time   NA 143 11/30/2013 0736   NA 138 11/24/2013 1429   K 3.9 11/30/2013 0736   CL 107 11/30/2013 0736   CO2 25 11/30/2013 0736   GLUCOSE 88 11/30/2013 0736   GLUCOSE 110* 11/24/2013 1429   BUN 7 11/30/2013 0736   BUN 16 11/24/2013 1429   CREATININE 0.93 11/30/2013 0736   CALCIUM 8.7 11/30/2013 0736   PROT 6.9 11/27/2013 1635   PROT 6.8 11/24/2013 1429   ALBUMIN 3.5 11/27/2013 1635   AST 20 11/27/2013 1635   ALT 11 11/27/2013 1635   ALKPHOS 71 11/27/2013 1635   BILITOT 0.3 11/27/2013 1635   GFRNONAA 53* 11/30/2013 0736   GFRAA 61* 11/30/2013 0736   Lipase  No results found for this basename: lipase       Studies/Results: Ct Chest W Contrast  11/30/2013   CLINICAL DATA:  Patient with history of recurrent colon cancer. Evaluate for metastatic disease.  EXAM: CT CHEST, ABDOMEN, AND  PELVIS WITH CONTRAST  TECHNIQUE: Multidetector CT imaging of the chest, abdomen and pelvis was performed following the standard protocol during bolus administration of intravenous contrast.  CONTRAST:  125mL OMNIPAQUE IOHEXOL 300 MG/ML  SOLN  COMPARISON:  CT 01/04/2012  FINDINGS: CT CHEST FINDINGS  Visualized thyroid is unremarkable. Normal heart size. No pericardial effusion. 1.2 cm superior mediastinal lymph node (image 13; series 2). Prominent pretracheal lymph nodes measuring up to 0.8 cm (image 21; series 2).  The central airways are patent. There is a 2.2 x 2.3 cm nodule within the superior segment of left lower lobe (image 31; series 3). There is a 3.8 x 3.3 cm mass within the superior segment of the right lower lobe (image 29; series 3). There is a 5 mm superior segment right lower lobe nodule slightly increased from prior (image 27; series 3). There is a new 3 mm nodule within the right upper lobe (image 12; series 3). No pleural effusion or pneumothorax. Scarring and or atelectasis within the right middle lobe and lingula.  CT ABDOMEN AND PELVIS FINDINGS  There is a stable 5 mm low-attenuation lesion within the right hepatic lobe, too small to characterize however likely benign given the stability. No  additional hepatic lesions are identified. Gallbladder is unremarkable. The spleen, pancreas and bilateral adrenal glands are unremarkable.  There is a small hiatal hernia. Simple cyst within the inferior pole of the right kidney. The kidneys enhance symmetrically with contrast. There is no hydronephrosis. Urinary bladder is unremarkable.  Normal caliber abdominal aorta. No retroperitoneal lymphadenopathy. Postsurgical changes compatible with prior sigmoid resection. When compared to prior CT there is increased presacral soft tissue thickening and perirectal soft tissue stranding. There is masslike thickening along the anterior aspect of the cecum measuring 3.6 x 2.7 cm. No evidence for pericecal  lymphadenopathy.  There is a 2.5 x 2.3 cm soft tissue mass within the anterior abdominal wall to the right aspect of the midline surgical scar (image 61; series 2). There are a few focally dilated loops of small bowel within the central abdomen.  Lower lumbar spine degenerative changes. No aggressive or acute appearing osseous lesions.  IMPRESSION: 1. Right lower lobe pulmonary mass and left lower lobe pulmonary nodule concerning for pulmonary metastatic disease. Additionally there is superior mediastinal adenopathy concerning for metastatic adenopathy. 2. Masslike thickening along the anterior aspect of the cecum most compatible with adenocarcinoma at the ileocecal location. 3. Interval increase in presacral soft tissue thickening and perirectal soft tissue stranding. Local regional recurrence at this location is not excluded. 4. Soft tissue mass within the anterior abdominal wall may be postoperative in etiology however metastatic implant is of concern. 5. Few dilated loops of small bowel within the central abdomen may represent focal ileus.   Electronically Signed   By: Lovey Newcomer M.D.   On: 11/30/2013 20:46   Ct Abdomen Pelvis W Contrast  11/30/2013   CLINICAL DATA:  Patient with history of recurrent colon cancer. Evaluate for metastatic disease.  EXAM: CT CHEST, ABDOMEN, AND PELVIS WITH CONTRAST  TECHNIQUE: Multidetector CT imaging of the chest, abdomen and pelvis was performed following the standard protocol during bolus administration of intravenous contrast.  CONTRAST:  123mL OMNIPAQUE IOHEXOL 300 MG/ML  SOLN  COMPARISON:  CT 01/04/2012  FINDINGS: CT CHEST FINDINGS  Visualized thyroid is unremarkable. Normal heart size. No pericardial effusion. 1.2 cm superior mediastinal lymph node (image 13; series 2). Prominent pretracheal lymph nodes measuring up to 0.8 cm (image 21; series 2).  The central airways are patent. There is a 2.2 x 2.3 cm nodule within the superior segment of left lower lobe (image 31;  series 3). There is a 3.8 x 3.3 cm mass within the superior segment of the right lower lobe (image 29; series 3). There is a 5 mm superior segment right lower lobe nodule slightly increased from prior (image 27; series 3). There is a new 3 mm nodule within the right upper lobe (image 12; series 3). No pleural effusion or pneumothorax. Scarring and or atelectasis within the right middle lobe and lingula.  CT ABDOMEN AND PELVIS FINDINGS  There is a stable 5 mm low-attenuation lesion within the right hepatic lobe, too small to characterize however likely benign given the stability. No additional hepatic lesions are identified. Gallbladder is unremarkable. The spleen, pancreas and bilateral adrenal glands are unremarkable.  There is a small hiatal hernia. Simple cyst within the inferior pole of the right kidney. The kidneys enhance symmetrically with contrast. There is no hydronephrosis. Urinary bladder is unremarkable.  Normal caliber abdominal aorta. No retroperitoneal lymphadenopathy. Postsurgical changes compatible with prior sigmoid resection. When compared to prior CT there is increased presacral soft tissue thickening and perirectal soft tissue stranding. There  is masslike thickening along the anterior aspect of the cecum measuring 3.6 x 2.7 cm. No evidence for pericecal lymphadenopathy.  There is a 2.5 x 2.3 cm soft tissue mass within the anterior abdominal wall to the right aspect of the midline surgical scar (image 61; series 2). There are a few focally dilated loops of small bowel within the central abdomen.  Lower lumbar spine degenerative changes. No aggressive or acute appearing osseous lesions.  IMPRESSION: 1. Right lower lobe pulmonary mass and left lower lobe pulmonary nodule concerning for pulmonary metastatic disease. Additionally there is superior mediastinal adenopathy concerning for metastatic adenopathy. 2. Masslike thickening along the anterior aspect of the cecum most compatible with  adenocarcinoma at the ileocecal location. 3. Interval increase in presacral soft tissue thickening and perirectal soft tissue stranding. Local regional recurrence at this location is not excluded. 4. Soft tissue mass within the anterior abdominal wall may be postoperative in etiology however metastatic implant is of concern. 5. Few dilated loops of small bowel within the central abdomen may represent focal ileus.   Electronically Signed   By: Lovey Newcomer M.D.   On: 11/30/2013 20:46    Anti-infectives: Anti-infectives   None       Assessment/Plan 1. 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 Pulmonary mets 2. ABL anemia secondary to #1 and chronic anticoagulation on Eliquis  3. Previous CVA  4. History of sigmoid and cecal adenocarcinoma, s/p resections  5. Diastolic HF  6. HTN  7. A. Fib  8. H/o ovarian cancer  9. Tachy-brady syndrome, s/p pacemaker  Plan: 1.  This is a difficult situation given this lesion is not obstructing, but is bleeding and her need for Eliquis.  2.  Her Chest CT suggests pulmonary lesions 3.  Medical management is probably her best option at this point.  We would not recommend re-operating unless she was found to have continued bleeding without anticoagulation.  Surgery would require a huge lengtly operation because of all her previous abdominal surgeries and adhesions. 4.  Recommend re-evaluating her need for Eliquis given her age and co-morbidities.  Would seem reasonable to d/c it, but will leave this up to cards and her primary doctors.   5.  She will need OP PET scans and oncology follow up 6.  She could be discharged as early as today if medically stable to follow up with Dr. Redmond Pulling in the office. 7.  Resume diet   LOS: 4 days    DORT, Elianna Windom 12/01/2013, 9:50 AM Pager: [phone]

## 2013-12-01 NOTE — Progress Notes (Signed)
Very sad and difficult situation. Metastatic disease has now been identified. Prognosis is grim. The focus will probably move to symptom relief. I think the benefit of anticoagulation is out-shadowed by certain bleeding. Would recommend stopping the Eliquis permanently. ASA 81 mg daily is a reasonable compromise, but I would stop this also if it causes bleeding.  Sanda Klein, MD, [facility] [phone] office [phone] pager

## 2013-12-01 NOTE — Discharge Instructions (Signed)
Follow with Primary MD [name], MD in 7 days   He will have home registered nurse do a CBC next Monday, September 21, please follow with your primary M.D. on Tuesday, to follow on her labs and if her hemoglobin is stable resume aspirin 81 mg oral daily,   Activity: As tolerated with Full fall precautions use walker/cane & assistance as needed   Disposition Home , with home PT and RN.   Diet: Heart Healthy , with feeding assistance and aspiration precautions as needed.  For Heart failure patients - Check your Weight same time everyday, if you gain over 2 pounds, or you develop in leg swelling, experience more shortness of breath or chest pain, call your Primary MD immediately. Follow Cardiac Low Salt Diet and 1.8 lit/day fluid restriction.   On your next visit with her primary care physician please Get Medicines reviewed and adjusted.  Please request your Prim.MD to go over all Hospital Tests and Procedure/Radiological results at the follow up, please get all Hospital records sent to your Prim MD by signing hospital release before you go home.   If you experience worsening of your admission symptoms, develop shortness of breath, life threatening emergency, suicidal or homicidal thoughts you must seek medical attention immediately by calling 911 or calling your MD immediately  if symptoms less severe.  You Must read complete instructions/literature along with all the possible adverse reactions/side effects for all the Medicines you take and that have been prescribed to you. Take any new Medicines after you have completely understood and accpet all the possible adverse reactions/side effects.   Do not drive, operating heavy machinery, perform activities at heights, swimming or participation in water activities or provide baby sitting services if your were admitted for syncope or siezures until you have seen by Primary MD or a Neurologist and advised to do so again.  Do not drive when  taking Pain medications.    Do not take more than prescribed Pain, Sleep and Anxiety Medications  Special Instructions: If you have smoked or chewed Tobacco  in the last 2 yrs please stop smoking, stop any regular Alcohol  and or any Recreational drug use.  Wear Seat belts while driving.   Please note  You were cared for by a hospitalist during your hospital stay. If you have any questions about your discharge medications or the care you received while you were in the hospital after you are discharged, you can call the unit and asked to speak with the hospitalist on call if the hospitalist that took care of you is not available. Once you are discharged, your primary care physician will handle any further medical issues. Please note that NO REFILLS for any discharge medications will be authorized once you are discharged, as it is imperative that you return to your primary care physician (or establish a relationship with a primary care physician if you do not have one) for your aftercare needs so that they can reassess your need for medications and monitor your lab values.

## 2013-12-02 LAB — CEA: CEA: 153 ng/mL — AB (ref 0.0–5.0)

## 2013-12-04 ENCOUNTER — Telehealth: Payer: Self-pay | Admitting: *Deleted

## 2013-12-04 ENCOUNTER — Ambulatory Visit: Payer: Medicare Other | Admitting: Nurse Practitioner

## 2013-12-04 LAB — CA 125: CA 125: 21 U/mL (ref 0.0–30.2)

## 2013-12-04 NOTE — Telephone Encounter (Signed)
[facility] nurse called and asked if patient was supposed to be on pepcid and ramipril? They are on here med list, but have not been ordered in a long time?

## 2013-12-04 NOTE — Telephone Encounter (Signed)
If she has not been taking just leave off med list

## 2013-12-05 ENCOUNTER — Other Ambulatory Visit: Payer: Self-pay | Admitting: Hematology

## 2013-12-06 ENCOUNTER — Telehealth: Payer: Self-pay | Admitting: Nurse Practitioner

## 2013-12-07 NOTE — Telephone Encounter (Signed)
[facility] notified

## 2013-12-09 DIAGNOSIS — D500 Iron deficiency anemia secondary to blood loss (chronic): Secondary | ICD-10-CM | POA: Insufficient documentation

## 2013-12-12 ENCOUNTER — Other Ambulatory Visit: Payer: Self-pay | Admitting: Hematology

## 2013-12-13 ENCOUNTER — Emergency Department (HOSPITAL_COMMUNITY)
Admission: EM | Admit: 2013-12-13 | Discharge: 2013-12-13 | Disposition: A | Discharge status: Home / Self Care | Payer: Medicare Other | Attending: Emergency Medicine | Admitting: Emergency Medicine

## 2013-12-13 ENCOUNTER — Emergency Department (HOSPITAL_COMMUNITY): Payer: Medicare Other

## 2013-12-13 ENCOUNTER — Encounter (HOSPITAL_COMMUNITY): Payer: Self-pay | Admitting: Emergency Medicine

## 2013-12-13 ENCOUNTER — Ambulatory Visit (HOSPITAL_COMMUNITY): Payer: Medicare Other

## 2013-12-13 DIAGNOSIS — Z7982 Long term (current) use of aspirin: Secondary | ICD-10-CM | POA: Diagnosis not present

## 2013-12-13 DIAGNOSIS — R5383 Other fatigue: Secondary | ICD-10-CM

## 2013-12-13 DIAGNOSIS — Z872 Personal history of diseases of the skin and subcutaneous tissue: Secondary | ICD-10-CM | POA: Insufficient documentation

## 2013-12-13 DIAGNOSIS — Z85038 Personal history of other malignant neoplasm of large intestine: Secondary | ICD-10-CM | POA: Insufficient documentation

## 2013-12-13 DIAGNOSIS — Z880 Allergy status to penicillin: Secondary | ICD-10-CM | POA: Diagnosis not present

## 2013-12-13 DIAGNOSIS — I5030 Unspecified diastolic (congestive) heart failure: Secondary | ICD-10-CM | POA: Insufficient documentation

## 2013-12-13 DIAGNOSIS — Z91040 Latex allergy status: Secondary | ICD-10-CM | POA: Insufficient documentation

## 2013-12-13 DIAGNOSIS — Z950 Presence of cardiac pacemaker: Secondary | ICD-10-CM | POA: Diagnosis not present

## 2013-12-13 DIAGNOSIS — N390 Urinary tract infection, site not specified: Secondary | ICD-10-CM | POA: Insufficient documentation

## 2013-12-13 DIAGNOSIS — Z8739 Personal history of other diseases of the musculoskeletal system and connective tissue: Secondary | ICD-10-CM | POA: Insufficient documentation

## 2013-12-13 DIAGNOSIS — D649 Anemia, unspecified: Secondary | ICD-10-CM | POA: Insufficient documentation

## 2013-12-13 DIAGNOSIS — Z8619 Personal history of other infectious and parasitic diseases: Secondary | ICD-10-CM | POA: Diagnosis not present

## 2013-12-13 DIAGNOSIS — I10 Essential (primary) hypertension: Secondary | ICD-10-CM | POA: Insufficient documentation

## 2013-12-13 DIAGNOSIS — Z79899 Other long term (current) drug therapy: Secondary | ICD-10-CM | POA: Insufficient documentation

## 2013-12-13 DIAGNOSIS — K219 Gastro-esophageal reflux disease without esophagitis: Secondary | ICD-10-CM | POA: Diagnosis not present

## 2013-12-13 DIAGNOSIS — Z8543 Personal history of malignant neoplasm of ovary: Secondary | ICD-10-CM | POA: Insufficient documentation

## 2013-12-13 DIAGNOSIS — R5381 Other malaise: Secondary | ICD-10-CM | POA: Diagnosis not present

## 2013-12-13 LAB — CBC WITH DIFFERENTIAL/PLATELET
BASOS ABS: 0 10*3/uL (ref 0.0–0.1)
Basophils Relative: 0 % (ref 0–1)
EOS ABS: 0.1 10*3/uL (ref 0.0–0.7)
EOS PCT: 1 % (ref 0–5)
HEMATOCRIT: 31.1 % — AB (ref 36.0–46.0)
Hemoglobin: 9.4 g/dL — ABNORMAL LOW (ref 12.0–15.0)
LYMPHS ABS: 1 10*3/uL (ref 0.7–4.0)
LYMPHS PCT: 14 % (ref 12–46)
MCH: 24.9 pg — ABNORMAL LOW (ref 26.0–34.0)
MCHC: 30.2 g/dL (ref 30.0–36.0)
MCV: 82.5 fL (ref 78.0–100.0)
MONO ABS: 0.7 10*3/uL (ref 0.1–1.0)
Monocytes Relative: 10 % (ref 3–12)
Neutro Abs: 5.3 10*3/uL (ref 1.7–7.7)
Neutrophils Relative %: 74 % (ref 43–77)
Platelets: 340 10*3/uL (ref 150–400)
RBC: 3.77 MIL/uL — ABNORMAL LOW (ref 3.87–5.11)
RDW: 19 % — AB (ref 11.5–15.5)
WBC: 7.1 10*3/uL (ref 4.0–10.5)

## 2013-12-13 LAB — URINALYSIS, ROUTINE W REFLEX MICROSCOPIC
Bilirubin Urine: NEGATIVE
Glucose, UA: NEGATIVE mg/dL
Hgb urine dipstick: NEGATIVE
Ketones, ur: NEGATIVE mg/dL
Leukocytes, UA: NEGATIVE
NITRITE: POSITIVE — AB
Protein, ur: NEGATIVE mg/dL
SPECIFIC GRAVITY, URINE: 1.015 (ref 1.005–1.030)
Urobilinogen, UA: 0.2 mg/dL (ref 0.0–1.0)
pH: 8.5 — ABNORMAL HIGH (ref 5.0–8.0)

## 2013-12-13 LAB — COMPREHENSIVE METABOLIC PANEL
ALT: 8 U/L (ref 0–35)
AST: 14 U/L (ref 0–37)
Albumin: 2.9 g/dL — ABNORMAL LOW (ref 3.5–5.2)
Alkaline Phosphatase: 78 U/L (ref 39–117)
Anion gap: 7 (ref 5–15)
BUN: 16 mg/dL (ref 6–23)
CALCIUM: 9 mg/dL (ref 8.4–10.5)
CO2: 31 mEq/L (ref 19–32)
CREATININE: 0.91 mg/dL (ref 0.50–1.10)
Chloride: 102 mEq/L (ref 96–112)
GFR calc Af Amer: 63 mL/min — ABNORMAL LOW (ref >90)
GFR calc non Af Amer: 54 mL/min — ABNORMAL LOW (ref >90)
GLUCOSE: 127 mg/dL — AB (ref 70–99)
Potassium: 4.2 mEq/L (ref 3.7–5.3)
SODIUM: 140 meq/L (ref 137–147)
TOTAL PROTEIN: 7.2 g/dL (ref 6.0–8.3)
Total Bilirubin: 0.3 mg/dL (ref 0.3–1.2)

## 2013-12-13 LAB — URINE MICROSCOPIC-ADD ON

## 2013-12-13 MED ORDER — SODIUM CHLORIDE 0.9 % IV SOLN
Freq: Once | INTRAVENOUS | Status: AC
  Administered 2013-12-13: 13:00:00 via INTRAVENOUS

## 2013-12-13 MED ORDER — CEPHALEXIN 500 MG PO CAPS
500.0000 mg | ORAL_CAPSULE | Freq: Once | ORAL | Status: AC
  Administered 2013-12-13: 500 mg via ORAL
  Filled 2013-12-13: qty 1

## 2013-12-13 MED ORDER — CEPHALEXIN 500 MG PO CAPS: 500.0000 mg | ORAL_CAPSULE | Freq: Two times a day (BID) | ORAL | Status: DC

## 2013-12-13 NOTE — ED Provider Notes (Signed)
CSN: [id number]     Arrival date & time 12/13/13  1031 History  This chart was scribed for Carmin Muskrat, MD by Marti Sleigh, ED Scribe. This patient was seen in room APA11/APA11 and the patient's care was started at 11:25 AM.    Chief Complaint  Patient presents with  . Fatigue    The history is provided by the patient and a relative. No language interpreter was used.   HPI Comments: [name] is a 80 y.o. female with a past and current hx of cancer with a recent operation for colon cancer who presents to the Emergency Department complaining of gradually worsening generalized weakness since this morning. Pt complains that she was so weak that she could not move her arms. Pt had a surgery for tumors two weeks ago at [facility] (2 weeks ago) for her most recent surgery (on colon), patient was informed she hadhemoglobin (7.3), and received 2 pints of blood. She report she was recently taken off Eliquis (originally prescribed for pacemaker by [facility]) due to her bleeds. Pt denies fever, vomiting, LOC, bloody stools, dysuria, and confusion. Pt has had a recent severe colon bleed. Pt has a past hx of ovarian cancer and received chemotherapy as treatment.    Past Medical History  Diagnosis Date  . Diastolic heart failure   . Ovarian cancer   . Colon cancer   . Arthritis   . History of Clostridium difficile infection     in Jan 2013  . Pacemaker   . History of cellulitis and abscess   . Hypertension     takes Ramipril,Diltiazem daily  . Atrial fibrillation     takes Coumadin daily  . Tachy-brady syndrome 2008    s/p PPM implantation by Dr Oswald Hillock takes Betapace daily  . Osteoporosis     takes Prolia every 87months injections  . Shortness of breath     with exertion;Albuterol prn  . Bruises easily     takes COumadin daily  . GERD (gastroesophageal reflux disease)     takes Protonix daily  . Constipation     Milk of Mag nightly  . Anemia     takes Iron daily  . CHF  (congestive heart failure)    Past Surgical History  Procedure Laterality Date  . Abdominal hysterectomy    . Oophorectomy    . Colonoscopy  03/30/2011    Procedure: COLONOSCOPY;  Surgeon: Missy Sabins, MD;  Location: [place];  Service: Endoscopy;  Laterality: N/A;  . Esophagogastroduodenoscopy  03/30/2011    Procedure: ESOPHAGOGASTRODUODENOSCOPY (EGD);  Surgeon: Missy Sabins, MD;  Location: [facility];  Service: Endoscopy;  Laterality: N/A;  . Colon resection  04/03/2011    Procedure: COLON RESECTION LAPAROSCOPIC;  Surgeon: Gayland Curry, MD;  Location: [place];  Service: General;  Laterality: N/A;  Laparoscopic, turned open sigmoid colon resection, lysis of adhesions x 2.5 hours, rigid proctoscopy.  . Pacemaker placement  2008    MDT EnRhythm implanted by Dr Leonia Reeves, generator change (MDT Adapta L) by Dr Rayann Heman 08/04/11  . Colonoscopy  12/24/2011    Procedure: COLONOSCOPY;  Surgeon: Rogene Houston, MD;  [facility];  Service: Endoscopy;  Laterality: N/A;  . Bilateral cataracts    . Battery changed in pacemaker    . Laparoscopic right hemi colectomy  02/19/2012    Procedure: LAPAROSCOPIC RIGHT HEMI COLECTOMY;  Surgeon: Gayland Curry, MD,FACS;  Location: [place];  Service: General;  Laterality: Right;  attempted  laparoscopic assisted right hemi colectomy converted to open.  . Partial colectomy  02/19/2012    Procedure: PARTIAL COLECTOMY;  Surgeon: Gayland Curry, MD,FACS;  Location: [place];  Service: General;  Laterality: Right;  . Lysis of adhesion  02/19/2012    Procedure: LYSIS OF ADHESION;  Surgeon: Gayland Curry, MD,FACS;  Location: [place];  Service: General;  Laterality: N/A;  . Esophagogastroduodenoscopy N/A 11/28/2013    Procedure: ESOPHAGOGASTRODUODENOSCOPY (EGD);  Surgeon: Jeryl Columbia, MD;  Location: [facility] ENDOSCOPY;  Service: Endoscopy;  Laterality: N/A;  . Colonoscopy with propofol N/A 11/29/2013    Procedure: COLONOSCOPY WITH PROPOFOL;  Surgeon: Cleotis Nipper, MD;   Location: [place];  Service: Endoscopy;  Laterality: N/A;   Family History  Problem Relation Age of Onset  . Anesthesia problems Neg Hx   . Hypotension Neg Hx   . Malignant hyperthermia Neg Hx   . Pseudochol deficiency Neg Hx   . Cancer Mother     ovarian   History  Substance Use Topics  . Smoking status: Never Smoker   . Smokeless tobacco: Never Used  . Alcohol Use: No   OB History   Grav Para Term Preterm Abortions TAB SAB Ect Mult Living                 Review of Systems  Constitutional:       Per HPI, otherwise negative  HENT:       Per HPI, otherwise negative  Respiratory:       Per HPI, otherwise negative  Cardiovascular:       Per HPI, otherwise negative  Gastrointestinal: Negative for vomiting.  Endocrine:       Negative aside from HPI  Genitourinary:       Neg aside from HPI   Musculoskeletal:       Per HPI, otherwise negative  Skin: Negative.   Neurological: Negative for syncope.      Allergies  Amiodarone; Diovan; Lasix; Penicillins; and Sulfa antibiotics  Home Medications   Prior to Admission medications   Medication Sig Start Date End Date Taking? Authorizing Provider  aspirin EC 81 MG tablet Take 1 tablet (81 mg total) by mouth daily. 12/05/13  Yes Dawood Elgergawy, MD  calcium-vitamin D (OSCAL WITH D) 500-200 MG-UNIT per tablet Take 1 tablet by mouth 2 (two) times daily.   Yes Historical Provider, MD  diltiazem (CARDIZEM CD) 180 MG 24 hr capsule Take 180 mg by mouth daily.   Yes Historical Provider, MD  ferrous sulfate 325 (65 FE) MG tablet Take 1 tablet (325 mg total) by mouth 3 (three) times daily with meals. 11/24/13  Yes Sharion Balloon, FNP  magnesium hydroxide (MILK OF MAGNESIA) 400 MG/5ML suspension Take 30 mLs by mouth at bedtime.    Yes Historical Provider, MD  pantoprazole (PROTONIX) 40 MG tablet Take 1 tablet (40 mg total) by mouth daily. 09/18/13  Yes Bobbi-Margaret Hassell Done, FNP  sotalol (BETAPACE) 120 MG tablet Take 240 mg by mouth 2  (two) times daily.    Yes Historical Provider, MD  ondansetron (ZOFRAN) 4 MG tablet Take 1 tablet (4 mg total) by mouth every 8 (eight) hours as needed for nausea or vomiting. 11/21/13   Christy A Hawks, FNP   BP 134/62  Pulse 60  Temp(Src) 98.2 F (36.8 C)  Resp 18  Ht 5\' 9"  (1.753 m)  Wt 185 lb (83.915 kg)  BMI 27.31 kg/m2  SpO2 96% Physical Exam  Nursing note and vitals reviewed.  Constitutional: She is oriented to person, place, and time. She appears well-developed and well-nourished. No distress.  Pt has a hx of pacemaker placement. Dr Sadie Haber is her doctor.   HENT:  Head: Normocephalic and atraumatic.  Eyes: Conjunctivae and EOM are normal.  Cardiovascular: Normal rate, regular rhythm and normal heart sounds.   Pulmonary/Chest: Effort normal and breath sounds normal. No stridor. No respiratory distress.  Abdominal: Soft. She exhibits no distension.  Musculoskeletal: She exhibits no edema.  Neurological: She is alert and oriented to person, place, and time. No cranial nerve deficit.  Skin: Skin is warm and dry.  Psychiatric: She has a normal mood and affect.    ED Course  Procedures (including critical care time)  DIAGNOSTIC STUDIES: Oxygen Saturation is 96% on room air, adequate by my interpretation.    COORDINATION OF CARE: 11:30 AM Discussed treatment plan with pt at bedside and pt agreed to plan.  Labs Review Labs Reviewed  CBC WITH DIFFERENTIAL - Abnormal; Notable for the following:    RBC 3.77 (*)    Hemoglobin 9.4 (*)    HCT 31.1 (*)    MCH 24.9 (*)    RDW 19.0 (*)    All other components within normal limits  COMPREHENSIVE METABOLIC PANEL - Abnormal; Notable for the following:    Glucose, Bld 127 (*)    Albumin 2.9 (*)    GFR calc non Af Amer 54 (*)    GFR calc Af Amer 63 (*)    All other components within normal limits  URINALYSIS, ROUTINE W REFLEX MICROSCOPIC    Imaging Review Dg Chest 2 View  12/13/2013   CLINICAL DATA:  History of ovarian colon  cancer.  Chest pain  EXAM: CHEST  2 VIEW  COMPARISON:  Chest CT from 11/30/2013.  FINDINGS: AP and lateral views of the chest show hyperexpansion without edema or focal airspace consolidation. No pleural effusion. 3.8 cm lung mass again identified in the superior segment of the right lower lobe, seen on the recent CT scan. The cardio pericardial silhouette is enlarged. Imaged bony structures of the thorax are intact. Telemetry leads overlie the chest.  IMPRESSION: Cardiomegaly.  3.8 cm right lower lobe pulmonary mass.   Electronically Signed   By: Misty Stanley M.D.   On: 12/13/2013 12:32     EKG Interpretation   Date/Time:  Wednesday December 13 2013 11:42:34 EDT Ventricular Rate:  61 PR Interval:    QRS Duration: 144 QT Interval:  485 QTC Calculation: 489 R Axis:   -52 Text Interpretation:  Junctional rhythm RBBB and LAFB Left ventricular  hypertrophy Lateral infarct, age indeterminate atrial-paced complexes  Non-specific intra-ventricular conduction delay Left ventricular  hypertrophy Abnormal ekg Confirmed by Carmin Muskrat  MD [phone]) on  12/13/2013 11:59:37 AM      MDM   Final diagnoses:  UTI (lower urinary tract infection)  Other fatigue   This patient with a history of colon cancer, metastatic now presents with fatigue.  Patient is awake alert, neurologically intact and hemodynamically stable, with no disorientation. Patient's evaluation demonstrates urinary tract infection, labs are otherwise unremarkable. Patient was discharged in stable condition to followup with her primary care physician after initiation of antibiotics.  I personally performed the services described in this documentation, which was scribed in my presence. The recorded information has been reviewed and is accurate.      Carmin Muskrat, MD 12/13/13 1556

## 2013-12-13 NOTE — ED Notes (Signed)
Pt c/o generalized weakness since 0530 this morning while going to bathroom and has continued with nausea, denies emesis

## 2013-12-13 NOTE — ED Notes (Signed)
Patient tried to urinate but was unable to go. Will try again later

## 2013-12-13 NOTE — ED Notes (Signed)
Patient urinated on her own. Did not need a In and out.

## 2013-12-13 NOTE — Discharge Instructions (Signed)
As discussed, your evaluation today has been largely reassuring.  But, it is important that you monitor your condition carefully, and do not hesitate to return to the ED if you develop new, or concerning changes in your condition. ? ?Otherwise, please follow-up with your physician for appropriate ongoing care. ? ?

## 2013-12-14 NOTE — Progress Notes (Signed)
This encounter was created in error - please disregard.

## 2013-12-17 ENCOUNTER — Other Ambulatory Visit: Payer: Self-pay | Admitting: Hematology

## 2013-12-21 ENCOUNTER — Other Ambulatory Visit (INDEPENDENT_AMBULATORY_CARE_PROVIDER_SITE_OTHER): Payer: Medicare Other

## 2013-12-21 DIAGNOSIS — N390 Urinary tract infection, site not specified: Secondary | ICD-10-CM

## 2013-12-21 DIAGNOSIS — R319 Hematuria, unspecified: Secondary | ICD-10-CM

## 2013-12-21 LAB — POCT URINALYSIS DIPSTICK
BILIRUBIN UA: NEGATIVE
Glucose, UA: NEGATIVE
Ketones, UA: NEGATIVE
NITRITE UA: POSITIVE
PH UA: 7.5
Spec Grav, UA: <=1.005
UROBILINOGEN UA: NEGATIVE

## 2013-12-21 LAB — POCT UA - MICROSCOPIC ONLY
CASTS, UR, LPF, POC: NEGATIVE
Crystals, Ur, HPF, POC: NEGATIVE
Mucus, UA: NEGATIVE
Yeast, UA: NEGATIVE

## 2013-12-21 NOTE — Progress Notes (Signed)
Lab only 

## 2013-12-22 ENCOUNTER — Other Ambulatory Visit: Payer: Self-pay | Admitting: Nurse Practitioner

## 2013-12-22 LAB — URINE CULTURE

## 2013-12-22 MED ORDER — DOXYCYCLINE HYCLATE 50 MG PO CAPS: 50.0000 mg | ORAL_CAPSULE | Freq: Two times a day (BID) | ORAL | Status: DC

## 2013-12-25 ENCOUNTER — Encounter: Payer: Self-pay | Admitting: Nurse Practitioner

## 2013-12-25 ENCOUNTER — Ambulatory Visit (INDEPENDENT_AMBULATORY_CARE_PROVIDER_SITE_OTHER): Payer: Medicare Other | Admitting: Nurse Practitioner

## 2013-12-25 VITALS — BP 171/88 | HR 80 | Temp 98.9°F | Ht 64.0 in | Wt 191.0 lb

## 2013-12-25 DIAGNOSIS — Z950 Presence of cardiac pacemaker: Secondary | ICD-10-CM

## 2013-12-25 DIAGNOSIS — R739 Hyperglycemia, unspecified: Secondary | ICD-10-CM

## 2013-12-25 DIAGNOSIS — I10 Essential (primary) hypertension: Secondary | ICD-10-CM

## 2013-12-25 DIAGNOSIS — D62 Acute posthemorrhagic anemia: Secondary | ICD-10-CM

## 2013-12-25 DIAGNOSIS — C3490 Malignant neoplasm of unspecified part of unspecified bronchus or lung: Secondary | ICD-10-CM

## 2013-12-25 DIAGNOSIS — D500 Iron deficiency anemia secondary to blood loss (chronic): Secondary | ICD-10-CM

## 2013-12-25 DIAGNOSIS — I495 Sick sinus syndrome: Secondary | ICD-10-CM

## 2013-12-25 DIAGNOSIS — I639 Cerebral infarction, unspecified: Secondary | ICD-10-CM

## 2013-12-25 DIAGNOSIS — I482 Chronic atrial fibrillation, unspecified: Secondary | ICD-10-CM

## 2013-12-25 DIAGNOSIS — K589 Irritable bowel syndrome without diarrhea: Secondary | ICD-10-CM

## 2013-12-25 NOTE — Patient Instructions (Signed)

## 2013-12-25 NOTE — Progress Notes (Signed)
Subjective:    Patient ID: [name], female    DOB: 05-13-1922, 80 y.o.   MRN: [id number]  HPI Patient in today for follow up- Since last visit she has been dx with lung cancer. They are not going to treat it. They have stopped her coumadin since diagnosis. SHe is feeling okay today. Has had several UTI over the last month but other wise she is hanging in there.  Patient Active Problem List   Diagnosis Date Noted  . Lung cancer 12/25/2013  . Iron deficiency anemia due to chronic blood loss 12/09/2013  . Colon cancer 12/01/2013  . GIB (gastrointestinal bleeding) 11/27/2013  . Acute blood loss anemia 11/27/2013  . Lacerations of multiple sites of left arm 11/07/2012  . Chronic anticoagulation 04/07/2012  . Stroke 02/26/2012  . Hyperglycemia 02/26/2012  . Cancer of right colon -02/2012 02/25/2012  . Tachycardia-bradycardia 07/30/2011  . Cancer of sigmoid colon s/p sigmoid colectomy for T3N0 lesion on 1/18 04/08/2011  . Atrial flutter with rapid ventricular response 03/27/2011  . Ovarian cancer 03/27/2011  . Anemia 03/27/2011  . Pacemaker-Medtronic 05/31/2010  . Edema 05/31/2010  . Hypertension 05/31/2010  . Atrial fibrillation 05/31/2010  . IBS (irritable bowel syndrome) 05/31/2010  . Osteoporosis 05/31/2010   Outpatient Encounter Prescriptions as of 12/25/2013  Medication Sig  . aspirin EC 81 MG tablet Take 1 tablet (81 mg total) by mouth daily.  . calcium-vitamin D (OSCAL WITH D) 500-200 MG-UNIT per tablet Take 1 tablet by mouth 2 (two) times daily.  Marland Kitchen diltiazem (CARDIZEM CD) 180 MG 24 hr capsule Take 180 mg by mouth daily.  . ferrous sulfate 325 (65 FE) MG tablet Take 1 tablet (325 mg total) by mouth 3 (three) times daily with meals.  . magnesium hydroxide (MILK OF MAGNESIA) 400 MG/5ML suspension Take 30 mLs by mouth at bedtime.   . ondansetron (ZOFRAN) 4 MG tablet Take 1 tablet (4 mg total) by mouth every 8 (eight) hours as needed for nausea or vomiting.  . pantoprazole  (PROTONIX) 40 MG tablet Take 1 tablet (40 mg total) by mouth daily.  . sotalol (BETAPACE) 120 MG tablet Take 240 mg by mouth 2 (two) times daily.   . [DISCONTINUED] cephALEXin (KEFLEX) 500 MG capsule Take 1 capsule (500 mg total) by mouth 2 (two) times daily.  . [DISCONTINUED] doxycycline (VIBRAMYCIN) 50 MG capsule Take 1 capsule (50 mg total) by mouth 2 (two) times daily.       Review of Systems  Constitutional: Positive for appetite change (decreased) and fatigue.  HENT: Negative.   Respiratory: Positive for shortness of breath (slight on exerxion).   Cardiovascular: Negative.   Gastrointestinal: Negative.   Genitourinary: Negative.   Neurological: Negative.   Psychiatric/Behavioral: Negative.        Objective:   Physical Exam  Constitutional: She is oriented to person, place, and time. She appears well-developed and well-nourished. No distress.  Cardiovascular: Normal rate, regular rhythm and normal heart sounds.   Pulmonary/Chest: Effort normal and breath sounds normal.  Abdominal: Soft. Bowel sounds are normal.  Neurological: She is alert and oriented to person, place, and time.  Skin: Skin is warm and dry. There is pallor.  Psychiatric: She has a normal mood and affect. Her behavior is normal. Judgment and thought content normal.   BP 171/88  Pulse 80  Temp(Src) 98.9 F (37.2 C) (Oral)  Ht _0  (1.626 m)  Wt 191 lb (86.637 kg)  BMI 32.77 kg/m2  Assessment & Plan:   1. Malignant neoplasm of lung, unspecified laterality, unspecified part of lung   2. Tachycardia-bradycardia   3. Pacemaker-Medtronic   4. Iron deficiency anemia due to chronic blood loss   5. IBS (irritable bowel syndrome)   6. Essential hypertension   7. Hyperglycemia   8. Chronic atrial fibrillation   9. Acute blood loss anemia    Orders Placed This Encounter  Procedures  . CMP14+EGFR  . Anemia Profile B   REST Continue all meds Labs pending Follow up in 3  months  [place], FNP

## 2013-12-26 LAB — CMP14+EGFR
ALBUMIN: 3.7 g/dL (ref 3.5–4.7)
ALK PHOS: 80 IU/L (ref 39–117)
ALT: 5 IU/L (ref 0–32)
AST: 15 IU/L (ref 0–40)
Albumin/Globulin Ratio: 1.4 (ref 1.1–2.5)
BUN / CREAT RATIO: 14 (ref 11–26)
BUN: 13 mg/dL (ref 8–27)
CO2: 25 mmol/L (ref 18–29)
Calcium: 8.9 mg/dL (ref 8.7–10.3)
Chloride: 102 mmol/L (ref 97–108)
Creatinine, Ser: 0.91 mg/dL (ref 0.57–1.00)
GFR calc Af Amer: 65 mL/min/{1.73_m2} (ref >59)
GFR, EST NON AFRICAN AMERICAN: 56 mL/min/{1.73_m2} — AB (ref >59)
GLUCOSE: 152 mg/dL — AB (ref 65–99)
Globulin, Total: 2.6 g/dL (ref 1.5–4.5)
Potassium: 4.2 mmol/L (ref 3.5–5.2)
Sodium: 142 mmol/L (ref 134–144)
TOTAL PROTEIN: 6.3 g/dL (ref 6.0–8.5)
Total Bilirubin: 0.2 mg/dL (ref 0.0–1.2)

## 2013-12-26 LAB — ANEMIA PROFILE B
BASOS ABS: 0 10*3/uL (ref 0.0–0.2)
Basos: 0 %
Eos: 1 %
Eosinophils Absolute: 0.1 10*3/uL (ref 0.0–0.4)
Ferritin: 28 ng/mL (ref 15–150)
HCT: 33.3 % — ABNORMAL LOW (ref 34.0–46.6)
Hemoglobin: 10.1 g/dL — ABNORMAL LOW (ref 11.1–15.9)
IMMATURE GRANS (ABS): 0 10*3/uL (ref 0.0–0.1)
IMMATURE GRANULOCYTES: 0 %
IRON SATURATION: 24 % (ref 15–55)
Iron: 78 ug/dL (ref 35–155)
Lymphocytes Absolute: 0.7 10*3/uL (ref 0.7–3.1)
Lymphs: 10 %
MCH: 24.6 pg — ABNORMAL LOW (ref 26.6–33.0)
MCHC: 30.3 g/dL — ABNORMAL LOW (ref 31.5–35.7)
MCV: 81 fL (ref 79–97)
Monocytes Absolute: 0.7 10*3/uL (ref 0.1–0.9)
Monocytes: 9 %
Neutrophils Absolute: 5.5 10*3/uL (ref 1.4–7.0)
Neutrophils Relative %: 80 %
PLATELETS: 332 10*3/uL (ref 150–379)
RBC: 4.11 x10E6/uL (ref 3.77–5.28)
RDW: 19.8 % — AB (ref 12.3–15.4)
Retic Ct Pct: 2.1 % (ref 0.6–2.6)
TIBC: 331 ug/dL (ref 250–450)
UIBC: 253 ug/dL (ref 150–375)
Vitamin B-12: 405 pg/mL (ref 211–946)
WBC: 7 10*3/uL (ref 3.4–10.8)

## 2014-01-02 ENCOUNTER — Ambulatory Visit (INDEPENDENT_AMBULATORY_CARE_PROVIDER_SITE_OTHER): Payer: Medicare Other | Admitting: Nurse Practitioner

## 2014-01-02 ENCOUNTER — Encounter: Payer: Self-pay | Admitting: Nurse Practitioner

## 2014-01-02 VITALS — BP 167/91 | HR 83 | Temp 97.3°F

## 2014-01-02 DIAGNOSIS — R1110 Vomiting, unspecified: Secondary | ICD-10-CM

## 2014-01-02 DIAGNOSIS — I639 Cerebral infarction, unspecified: Secondary | ICD-10-CM

## 2014-01-02 LAB — POCT HEMOGLOBIN: HEMOGLOBIN: 10.3 g/dL — AB (ref 12.2–16.2)

## 2014-01-02 NOTE — Patient Instructions (Signed)

## 2014-01-02 NOTE — Progress Notes (Signed)
   Subjective:    Patient ID: [name], female    DOB: 09/23/1922, 80 y.o.   MRN: [id number]  HPI Patient brought in by her son with c/o nausea and fatigue. Started after she ate lunch- took some immetrol but happens helped any.    Review of Systems  Constitutional: Negative.   HENT: Negative.   Respiratory: Negative.   Cardiovascular: Negative.   Gastrointestinal: Positive for nausea and vomiting.  Genitourinary: Negative.   Musculoskeletal: Negative.   Neurological: Negative.   Psychiatric/Behavioral: Negative.   All other systems reviewed and are negative.      Objective:   Physical Exam  Constitutional: She is oriented to person, place, and time. She appears well-developed and well-nourished.  Eyes: Pupils are equal, round, and reactive to light.  Neck: Normal range of motion. Neck supple.  Cardiovascular: Normal rate, regular rhythm and normal heart sounds.   Pulmonary/Chest: Effort normal and breath sounds normal.  Neurological: She is alert and oriented to person, place, and time.  Skin: Skin is warm and dry.  Psychiatric: She has a normal mood and affect. Her behavior is normal. Judgment and thought content normal.   BP 167/91  Pulse 83  Temp(Src) 97.3 F (36.3 C) (Oral)        Assessment & Plan:   1. Intractable vomiting with nausea, vomiting of unspecified type   zofran as previously ordered [employer] First 24 Hours-Clear liquids  popsicles  Jello  gatorade  Sprite Second 24 hours-Add Full liquids ( Liquids you cant see through) Third 24 hours- Bland diet ( foods that are baked or broiled)  *avoiding fried foods and highly spiced foods* During these 3 days  Avoid milk, cheese, ice cream or any other dairy products  Avoid caffeine- REMEMBER Mt. Dew and Mello Yellow contain lots of caffeine You should eat and drink in  Frequent small volumes If no improvement in symptoms or worsen in 2-3 days should RETRUN TO OFFICE or go to  ER!    Myonna-Margaret Hassell Done, FNP

## 2014-01-03 LAB — BMP8+EGFR
BUN/Creatinine Ratio: 15 (ref 11–26)
BUN: 14 mg/dL (ref 8–27)
CALCIUM: 9.4 mg/dL (ref 8.7–10.3)
CO2: 25 mmol/L (ref 18–29)
Chloride: 99 mmol/L (ref 97–108)
Creatinine, Ser: 0.93 mg/dL (ref 0.57–1.00)
GFR calc Af Amer: 63 mL/min/{1.73_m2} (ref >59)
GFR calc non Af Amer: 55 mL/min/{1.73_m2} — ABNORMAL LOW (ref >59)
Glucose: 121 mg/dL — ABNORMAL HIGH (ref 65–99)
POTASSIUM: 4.7 mmol/L (ref 3.5–5.2)
Sodium: 142 mmol/L (ref 134–144)

## 2014-01-03 LAB — SPECIMEN STATUS REPORT

## 2014-01-03 NOTE — Telephone Encounter (Signed)
Aware. 

## 2014-01-03 NOTE — Telephone Encounter (Signed)
Message copied by Shelbie Ammons on Wed Jan 03, 2014  4:23 PM ------      Message from: Chevis Pretty      Created: Wed Jan 03, 2014  2:36 PM       Kidney function normal ------

## 2014-01-09 ENCOUNTER — Telehealth: Payer: Self-pay | Admitting: Family Medicine

## 2014-01-09 NOTE — Telephone Encounter (Signed)
Patient is feeling very nauseas and throwing up i suggest she take one of the zofran she had and would ask you what you wanted her to do?

## 2014-01-09 NOTE — Telephone Encounter (Signed)
Take zofran as rx- family notified

## 2014-01-11 ENCOUNTER — Ambulatory Visit: Payer: Medicare Other | Admitting: Nurse Practitioner

## 2014-01-13 DIAGNOSIS — I482 Chronic atrial fibrillation: Secondary | ICD-10-CM

## 2014-01-13 DIAGNOSIS — D62 Acute posthemorrhagic anemia: Secondary | ICD-10-CM

## 2014-01-13 DIAGNOSIS — K922 Gastrointestinal hemorrhage, unspecified: Secondary | ICD-10-CM

## 2014-01-15 ENCOUNTER — Emergency Department (HOSPITAL_COMMUNITY): Payer: Medicare Other

## 2014-01-15 ENCOUNTER — Telehealth: Payer: Self-pay | Admitting: *Deleted

## 2014-01-15 ENCOUNTER — Encounter (HOSPITAL_COMMUNITY): Payer: Self-pay | Admitting: *Deleted

## 2014-01-15 ENCOUNTER — Inpatient Hospital Stay (HOSPITAL_COMMUNITY)
Admission: EM | Admit: 2014-01-15 | Discharge: 2014-01-22 | DRG: 375 | Disposition: A | Discharge status: Hospice | Payer: Medicare Other | Attending: Internal Medicine | Admitting: Internal Medicine

## 2014-01-15 ENCOUNTER — Inpatient Hospital Stay (HOSPITAL_COMMUNITY): Payer: Medicare Other

## 2014-01-15 DIAGNOSIS — I480 Paroxysmal atrial fibrillation: Secondary | ICD-10-CM | POA: Diagnosis present

## 2014-01-15 DIAGNOSIS — Z66 Do not resuscitate: Secondary | ICD-10-CM | POA: Diagnosis present

## 2014-01-15 DIAGNOSIS — Z515 Encounter for palliative care: Secondary | ICD-10-CM

## 2014-01-15 DIAGNOSIS — I10 Essential (primary) hypertension: Secondary | ICD-10-CM | POA: Diagnosis present

## 2014-01-15 DIAGNOSIS — C7800 Secondary malignant neoplasm of unspecified lung: Secondary | ICD-10-CM | POA: Diagnosis present

## 2014-01-15 DIAGNOSIS — I495 Sick sinus syndrome: Secondary | ICD-10-CM

## 2014-01-15 DIAGNOSIS — M810 Age-related osteoporosis without current pathological fracture: Secondary | ICD-10-CM | POA: Diagnosis present

## 2014-01-15 DIAGNOSIS — D62 Acute posthemorrhagic anemia: Secondary | ICD-10-CM | POA: Diagnosis present

## 2014-01-15 DIAGNOSIS — K589 Irritable bowel syndrome without diarrhea: Secondary | ICD-10-CM | POA: Diagnosis present

## 2014-01-15 DIAGNOSIS — K5669 Other intestinal obstruction: Secondary | ICD-10-CM | POA: Diagnosis not present

## 2014-01-15 DIAGNOSIS — E860 Dehydration: Secondary | ICD-10-CM | POA: Diagnosis present

## 2014-01-15 DIAGNOSIS — Z950 Presence of cardiac pacemaker: Secondary | ICD-10-CM

## 2014-01-15 DIAGNOSIS — Z9049 Acquired absence of other specified parts of digestive tract: Secondary | ICD-10-CM | POA: Diagnosis present

## 2014-01-15 DIAGNOSIS — I5032 Chronic diastolic (congestive) heart failure: Secondary | ICD-10-CM | POA: Diagnosis not present

## 2014-01-15 DIAGNOSIS — K56600 Partial intestinal obstruction, unspecified as to cause: Secondary | ICD-10-CM | POA: Diagnosis present

## 2014-01-15 DIAGNOSIS — D500 Iron deficiency anemia secondary to blood loss (chronic): Secondary | ICD-10-CM | POA: Diagnosis present

## 2014-01-15 DIAGNOSIS — F419 Anxiety disorder, unspecified: Secondary | ICD-10-CM | POA: Diagnosis not present

## 2014-01-15 DIAGNOSIS — C189 Malignant neoplasm of colon, unspecified: Secondary | ICD-10-CM | POA: Diagnosis not present

## 2014-01-15 DIAGNOSIS — Z7901 Long term (current) use of anticoagulants: Secondary | ICD-10-CM

## 2014-01-15 DIAGNOSIS — K5900 Constipation, unspecified: Secondary | ICD-10-CM | POA: Diagnosis present

## 2014-01-15 DIAGNOSIS — R0602 Shortness of breath: Secondary | ICD-10-CM

## 2014-01-15 DIAGNOSIS — R112 Nausea with vomiting, unspecified: Secondary | ICD-10-CM | POA: Insufficient documentation

## 2014-01-15 DIAGNOSIS — I482 Chronic atrial fibrillation: Secondary | ICD-10-CM

## 2014-01-15 DIAGNOSIS — N289 Disorder of kidney and ureter, unspecified: Secondary | ICD-10-CM

## 2014-01-15 DIAGNOSIS — Z8673 Personal history of transient ischemic attack (TIA), and cerebral infarction without residual deficits: Secondary | ICD-10-CM

## 2014-01-15 DIAGNOSIS — Z0189 Encounter for other specified special examinations: Secondary | ICD-10-CM

## 2014-01-15 DIAGNOSIS — K219 Gastro-esophageal reflux disease without esophagitis: Secondary | ICD-10-CM | POA: Diagnosis present

## 2014-01-15 DIAGNOSIS — Z8543 Personal history of malignant neoplasm of ovary: Secondary | ICD-10-CM

## 2014-01-15 DIAGNOSIS — R109 Unspecified abdominal pain: Secondary | ICD-10-CM

## 2014-01-15 DIAGNOSIS — I4891 Unspecified atrial fibrillation: Secondary | ICD-10-CM | POA: Diagnosis present

## 2014-01-15 DIAGNOSIS — Z7982 Long term (current) use of aspirin: Secondary | ICD-10-CM

## 2014-01-15 DIAGNOSIS — C3490 Malignant neoplasm of unspecified part of unspecified bronchus or lung: Secondary | ICD-10-CM | POA: Diagnosis present

## 2014-01-15 DIAGNOSIS — Z4659 Encounter for fitting and adjustment of other gastrointestinal appliance and device: Secondary | ICD-10-CM | POA: Insufficient documentation

## 2014-01-15 DIAGNOSIS — D649 Anemia, unspecified: Secondary | ICD-10-CM | POA: Diagnosis present

## 2014-01-15 LAB — COMPREHENSIVE METABOLIC PANEL
ALBUMIN: 3.2 g/dL — AB (ref 3.5–5.2)
ALT: 9 U/L (ref 0–35)
ANION GAP: 10 (ref 5–15)
AST: 19 U/L (ref 0–37)
Alkaline Phosphatase: 110 U/L (ref 39–117)
BUN: 21 mg/dL (ref 6–23)
CALCIUM: 9.5 mg/dL (ref 8.4–10.5)
CO2: 29 mEq/L (ref 19–32)
CREATININE: 1.11 mg/dL — AB (ref 0.50–1.10)
Chloride: 101 mEq/L (ref 96–112)
GFR calc Af Amer: 50 mL/min — ABNORMAL LOW (ref >90)
GFR calc non Af Amer: 43 mL/min — ABNORMAL LOW (ref >90)
Glucose, Bld: 106 mg/dL — ABNORMAL HIGH (ref 70–99)
Potassium: 4.6 mEq/L (ref 3.7–5.3)
Sodium: 140 mEq/L (ref 137–147)
TOTAL PROTEIN: 7.6 g/dL (ref 6.0–8.3)
Total Bilirubin: 0.4 mg/dL (ref 0.3–1.2)

## 2014-01-15 LAB — URINALYSIS, ROUTINE W REFLEX MICROSCOPIC
Bilirubin Urine: NEGATIVE
Glucose, UA: NEGATIVE mg/dL
Hgb urine dipstick: NEGATIVE
Ketones, ur: NEGATIVE mg/dL
LEUKOCYTES UA: NEGATIVE
Nitrite: NEGATIVE
Protein, ur: NEGATIVE mg/dL
SPECIFIC GRAVITY, URINE: 1.01 (ref 1.005–1.030)
UROBILINOGEN UA: 0.2 mg/dL (ref 0.0–1.0)
pH: 7.5 (ref 5.0–8.0)

## 2014-01-15 LAB — CBC WITH DIFFERENTIAL/PLATELET
BASOS PCT: 0 % (ref 0–1)
Basophils Absolute: 0 10*3/uL (ref 0.0–0.1)
EOS ABS: 0.1 10*3/uL (ref 0.0–0.7)
Eosinophils Relative: 1 % (ref 0–5)
HCT: 38.5 % (ref 36.0–46.0)
Hemoglobin: 11.7 g/dL — ABNORMAL LOW (ref 12.0–15.0)
Lymphocytes Relative: 16 % (ref 12–46)
Lymphs Abs: 1.2 10*3/uL (ref 0.7–4.0)
MCH: 25.6 pg — AB (ref 26.0–34.0)
MCHC: 30.4 g/dL (ref 30.0–36.0)
MCV: 84.2 fL (ref 78.0–100.0)
Monocytes Absolute: 0.9 10*3/uL (ref 0.1–1.0)
Monocytes Relative: 12 % (ref 3–12)
NEUTROS PCT: 71 % (ref 43–77)
Neutro Abs: 5.3 10*3/uL (ref 1.7–7.7)
Platelets: 351 10*3/uL (ref 150–400)
RBC: 4.57 MIL/uL (ref 3.87–5.11)
RDW: 19.8 % — ABNORMAL HIGH (ref 11.5–15.5)
WBC: 7.5 10*3/uL (ref 4.0–10.5)

## 2014-01-15 LAB — LIPASE, BLOOD: LIPASE: 28 U/L (ref 11–59)

## 2014-01-15 MED ORDER — SODIUM CHLORIDE 0.9 % IV SOLN
INTRAVENOUS | Status: DC
  Administered 2014-01-15: 15:00:00 via INTRAVENOUS

## 2014-01-15 MED ORDER — MORPHINE SULFATE 2 MG/ML IJ SOLN: 2.0000 mg | INTRAMUSCULAR | Status: DC | PRN

## 2014-01-15 MED ORDER — MORPHINE SULFATE 2 MG/ML IJ SOLN
1.0000 mg | Freq: Once | INTRAMUSCULAR | Status: AC
  Administered 2014-01-15: 1 mg via INTRAVENOUS
  Filled 2014-01-15: qty 1

## 2014-01-15 MED ORDER — ONDANSETRON HCL 4 MG/2ML IJ SOLN
4.0000 mg | Freq: Once | INTRAMUSCULAR | Status: AC
  Administered 2014-01-15: 4 mg via INTRAVENOUS
  Filled 2014-01-15: qty 2

## 2014-01-15 MED ORDER — LORAZEPAM 2 MG/ML IJ SOLN
0.5000 mg | Freq: Every evening | INTRAMUSCULAR | Status: DC | PRN
  Administered 2014-01-15: 1 mg via INTRAVENOUS
  Filled 2014-01-15: qty 1

## 2014-01-15 MED ORDER — SODIUM CHLORIDE 0.9 % IV SOLN
INTRAVENOUS | Status: AC
  Administered 2014-01-15: 18:00:00 via INTRAVENOUS

## 2014-01-15 MED ORDER — SOTALOL HCL 80 MG PO TABS
ORAL_TABLET | ORAL | Status: AC
  Filled 2014-01-15: qty 3

## 2014-01-15 MED ORDER — IOHEXOL 300 MG/ML  SOLN
50.0000 mL | Freq: Once | INTRAMUSCULAR | Status: AC | PRN
  Administered 2014-01-15: 50 mL via ORAL

## 2014-01-15 MED ORDER — ACETAMINOPHEN 650 MG RE SUPP: 650.0000 mg | Freq: Four times a day (QID) | RECTAL | Status: DC | PRN

## 2014-01-15 MED ORDER — SODIUM CHLORIDE 0.9 % IV BOLUS (SEPSIS)
500.0000 mL | Freq: Once | INTRAVENOUS | Status: AC
  Administered 2014-01-15: 500 mL via INTRAVENOUS

## 2014-01-15 MED ORDER — SODIUM CHLORIDE 0.9 % IV SOLN
INTRAVENOUS | Status: DC
  Administered 2014-01-16: 23:00:00 via INTRAVENOUS
  Administered 2014-01-18: 1000 mL via INTRAVENOUS
  Administered 2014-01-18 – 2014-01-19 (×2): via INTRAVENOUS
  Administered 2014-01-19: 75 mL/h via INTRAVENOUS
  Administered 2014-01-20: 08:00:00 via INTRAVENOUS

## 2014-01-15 MED ORDER — SODIUM CHLORIDE 0.9 % IJ SOLN
3.0000 mL | Freq: Two times a day (BID) | INTRAMUSCULAR | Status: DC
  Administered 2014-01-15 – 2014-01-21 (×7): 3 mL via INTRAVENOUS

## 2014-01-15 MED ORDER — SOTALOL HCL 80 MG PO TABS
240.0000 mg | ORAL_TABLET | Freq: Two times a day (BID) | ORAL | Status: DC
  Administered 2014-01-16 – 2014-01-22 (×12): 240 mg via ORAL
  Filled 2014-01-15 (×5): qty 3
  Filled 2014-01-15: qty 2
  Filled 2014-01-15: qty 3
  Filled 2014-01-15: qty 2
  Filled 2014-01-15 (×2): qty 3
  Filled 2014-01-15: qty 2
  Filled 2014-01-15 (×6): qty 3
  Filled 2014-01-15: qty 2

## 2014-01-15 MED ORDER — ALPRAZOLAM 0.25 MG PO TABS: 0.2500 mg | ORAL_TABLET | Freq: Three times a day (TID) | ORAL | Status: DC | PRN

## 2014-01-15 MED ORDER — SODIUM CHLORIDE 0.9 % IV SOLN: INTRAVENOUS | Status: DC

## 2014-01-15 MED ORDER — DILTIAZEM HCL ER COATED BEADS 180 MG PO CP24
180.0000 mg | ORAL_CAPSULE | Freq: Every day | ORAL | Status: DC
Start: 2014-01-16 — End: 2014-01-22
  Administered 2014-01-16 – 2014-01-22 (×7): 180 mg via ORAL
  Filled 2014-01-15 (×7): qty 1

## 2014-01-15 MED ORDER — ONDANSETRON HCL 4 MG/2ML IJ SOLN: 4.0000 mg | Freq: Three times a day (TID) | INTRAMUSCULAR | Status: DC | PRN

## 2014-01-15 MED ORDER — PROMETHAZINE HCL 25 MG PO TABS
12.5000 mg | ORAL_TABLET | Freq: Four times a day (QID) | ORAL | Status: DC | PRN
  Administered 2014-01-20: 12.5 mg via ORAL
  Filled 2014-01-15: qty 1

## 2014-01-15 MED ORDER — HEPARIN SODIUM (PORCINE) 5000 UNIT/ML IJ SOLN
5000.0000 [IU] | Freq: Three times a day (TID) | INTRAMUSCULAR | Status: DC
  Administered 2014-01-15 – 2014-01-22 (×16): 5000 [IU] via SUBCUTANEOUS
  Filled 2014-01-15 (×22): qty 1

## 2014-01-15 MED ORDER — LORAZEPAM 2 MG/ML IJ SOLN: 0.0500 mg | Freq: Every evening | INTRAMUSCULAR | Status: DC | PRN

## 2014-01-15 MED ORDER — SODIUM CHLORIDE 0.9 % IV BOLUS (SEPSIS): 250.0000 mL | Freq: Once | INTRAVENOUS | Status: DC

## 2014-01-15 MED ORDER — PANTOPRAZOLE SODIUM 40 MG IV SOLR
40.0000 mg | INTRAVENOUS | Status: DC
  Administered 2014-01-15 – 2014-01-19 (×5): 40 mg via INTRAVENOUS
  Filled 2014-01-15 (×6): qty 40

## 2014-01-15 MED ORDER — IOHEXOL 300 MG/ML  SOLN
80.0000 mL | Freq: Once | INTRAMUSCULAR | Status: AC | PRN
  Administered 2014-01-15: 80 mL via INTRAVENOUS

## 2014-01-15 MED ORDER — ACETAMINOPHEN 325 MG PO TABS: 650.0000 mg | ORAL_TABLET | Freq: Four times a day (QID) | ORAL | Status: DC | PRN

## 2014-01-15 MED ORDER — ASPIRIN EC 81 MG PO TBEC
81.0000 mg | DELAYED_RELEASE_TABLET | Freq: Every day | ORAL | Status: DC
  Administered 2014-01-16 – 2014-01-22 (×7): 81 mg via ORAL
  Filled 2014-01-15 (×7): qty 1

## 2014-01-15 NOTE — ED Provider Notes (Addendum)
CSN: [id number]     Arrival date & time 01/15/14  1229 History  This chart was scribed for Fredia Sorrow, MD by Edison Simon, ED Scribe. This patient was seen in room APA03/APA03 and the patient's care was started at 2:02 PM.    Chief Complaint  Patient presents with  . Emesis   Patient is a 80 y.o. female presenting with vomiting. The history is provided by the patient and a relative. No language interpreter was used.  Emesis Severity:  Moderate Duration:  1 week Timing:  Constant Feeding tolerance: neither solids not liquids. Progression:  Worsening Chronicity:  New Recent urination:  Decreased Context: not post-tussive and not self-induced   Relieved by:  Nothing Worsened by:  Liquids (and solids) Ineffective treatments: Zoloft, Emetrol. Associated symptoms: abdominal pain (right sided), diarrhea, headaches (not persistent) and sore throat   Associated symptoms: no chills   Risk factors comment:  Metastatic colon cancer   HPI Comments: [name] is a 80 y.o. female with metastatic colon cancer who presents to the Emergency Department complaining of nausea and vomiting with onset 1 week ago. Per daughter, she had colon cancer once before that was treated with surgery, and in September, it was found to have recurred and metastasized; her oncologist decided not to do surgery. She states the patient's nausea was intermittent but is now constant. She has been taking Emetrol and Zofran without remission of symptoms. She states she vomits after eating or drinking and so has not had much to eat or drink; she states she last vomited this morning after eating toast and juice. She also reports frequent "sour burps." She states they met with an oncologist here in September; upon consulting with him today, he directed her to come here and seek admission. She states she has some home health care as well. She also has history of ovarian cancer and GI bleed.  Past Medical History  Diagnosis Date   . Diastolic heart failure   . Arthritis   . History of Clostridium difficile infection     in Jan 2013  . Pacemaker   . History of cellulitis and abscess   . Hypertension     takes Ramipril,Diltiazem daily  . Atrial fibrillation     takes Coumadin daily  . Tachy-brady syndrome 2008    s/p PPM implantation by Dr Oswald Hillock takes Betapace daily  . Osteoporosis     takes Prolia every 51month injections  . Shortness of breath     with exertion;Albuterol prn  . Bruises easily     takes COumadin daily  . GERD (gastroesophageal reflux disease)     takes Protonix daily  . Constipation     Milk of Mag nightly  . Anemia     takes Iron daily  . CHF (congestive heart failure)   . Ovarian cancer   . Colon cancer    Past Surgical History  Procedure Laterality Date  . Abdominal hysterectomy    . Oophorectomy    . Colonoscopy  03/30/2011    Procedure: COLONOSCOPY;  Surgeon: JMissy Sabins MD;  [facility] [facility]  Service: Endoscopy;  Laterality: N/A;  . Esophagogastroduodenoscopy  03/30/2011    Procedure: ESOPHAGOGASTRODUODENOSCOPY (EGD);  Surgeon: JMissy Sabins MD;  [facility]  Service: Endoscopy;  Laterality: N/A;  . Colon resection  04/03/2011    Procedure: COLON RESECTION LAPAROSCOPIC;  Surgeon: EGayland Curry MD;  [place] [facility]  Service: General;  Laterality: N/A;  Laparoscopic, turned open sigmoid  colon resection, lysis of adhesions x 2.5 hours, rigid proctoscopy.  . Pacemaker placement  2008    MDT EnRhythm implanted by Dr Leonia Reeves, generator change (MDT Adapta L) by Dr Rayann Heman 08/04/11  . Colonoscopy  12/24/2011    Procedure: COLONOSCOPY;  Surgeon: Rogene Houston, MD;  [facility];  Service: Endoscopy;  Laterality: N/A;  . Bilateral cataracts    . Battery changed in pacemaker    . Laparoscopic right hemi colectomy  02/19/2012    Procedure: LAPAROSCOPIC RIGHT HEMI COLECTOMY;  Surgeon: Gayland Curry, MD,FACS;  Location: [place];  Service: General;   Laterality: Right;  attempted laparoscopic assisted right hemi colectomy converted to open.  . Partial colectomy  02/19/2012    Procedure: PARTIAL COLECTOMY;  Surgeon: Gayland Curry, MD,FACS;  Location: [place];  Service: General;  Laterality: Right;  . Lysis of adhesion  02/19/2012    Procedure: LYSIS OF ADHESION;  Surgeon: Gayland Curry, MD,FACS;  Location: [place];  Service: General;  Laterality: N/A;  . Esophagogastroduodenoscopy N/A 11/28/2013    Procedure: ESOPHAGOGASTRODUODENOSCOPY (EGD);  Surgeon: Jeryl Columbia, MD;  [facility];  Service: Endoscopy;  Laterality: N/A;  . Colonoscopy with propofol N/A 11/29/2013    Procedure: COLONOSCOPY WITH PROPOFOL;  Surgeon: Cleotis Nipper, MD;  Location: [place];  Service: Endoscopy;  Laterality: N/A;   Family History  Problem Relation Age of Onset  . Anesthesia problems Neg Hx   . Hypotension Neg Hx   . Malignant hyperthermia Neg Hx   . Pseudochol deficiency Neg Hx   . Cancer Mother     ovarian   History  Substance Use Topics  . Smoking status: Never Smoker   . Smokeless tobacco: Never Used  . Alcohol Use: No   OB History    No data available     Review of Systems  Constitutional: Positive for fever (subjective) and appetite change. Negative for chills.  HENT: Positive for congestion and sore throat. Negative for rhinorrhea.        Throat feels hot and full  Eyes: Negative for visual disturbance.  Respiratory: Positive for cough and shortness of breath (with exertion).   Cardiovascular: Positive for leg swelling (with standing). Negative for chest pain.  Gastrointestinal: Positive for nausea, vomiting, abdominal pain (right sided) and diarrhea.  Genitourinary: Negative for dysuria.  Musculoskeletal: Negative for back pain.  Skin: Negative for rash.  Neurological: Positive for weakness and headaches (not persistent).  Hematological: Does not bruise/bleed easily.  Psychiatric/Behavioral: Negative for confusion.       Allergies  Amiodarone; Diovan; Lasix; Penicillins; and Sulfa antibiotics  Home Medications   Prior to Admission medications   Medication Sig Start Date End Date Taking? Authorizing Provider  aspirin EC 81 MG tablet Take 1 tablet (81 mg total) by mouth daily. 12/05/13  Yes Dawood Elgergawy, MD  calcium-vitamin D (OSCAL WITH D) 500-200 MG-UNIT per tablet Take 1 tablet by mouth 2 (two) times daily.   Yes Historical Provider, MD  diltiazem (CARDIZEM CD) 180 MG 24 hr capsule Take 180 mg by mouth daily.   Yes Historical Provider, MD  ferrous sulfate 325 (65 FE) MG tablet Take 1 tablet (325 mg total) by mouth 3 (three) times daily with meals. 11/24/13  Yes Sharion Balloon, FNP  magnesium hydroxide (MILK OF MAGNESIA) 400 MG/5ML suspension Take 30 mLs by mouth at bedtime.    Yes Historical Provider, MD  ondansetron (ZOFRAN) 4 MG tablet Take 1 tablet (4 mg total)  by mouth every 8 (eight) hours as needed for nausea or vomiting. 11/21/13  Yes Sharion Balloon, FNP  pantoprazole (PROTONIX) 40 MG tablet Take 1 tablet (40 mg total) by mouth daily. 09/18/13  Yes Carlon-Margaret Hassell Done, FNP  sotalol (BETAPACE) 120 MG tablet Take 240 mg by mouth 2 (two) times daily.    Yes Historical Provider, MD   BP 148/71 mmHg  Pulse 67  Temp(Src) 99 F (37.2 C) (Oral)  Resp 17  Ht 5' 9" (1.753 m)  Wt 185 lb (83.915 kg)  BMI 27.31 kg/m2  SpO2 94% Physical Exam  Constitutional: She is oriented to person, place, and time. She appears well-developed and well-nourished.  HENT:  Head: Normocephalic and atraumatic.  Mucous membranes fairly moist  Eyes: Conjunctivae and EOM are normal.  Neck: Normal range of motion. Neck supple.  Cardiovascular: Normal rate and regular rhythm.   Pulmonary/Chest: Effort normal and breath sounds normal.  Lungs clear on both sides  Abdominal: Soft. Bowel sounds are normal. She exhibits no distension.  Musculoskeletal: Normal range of motion. She exhibits no edema.  Neurological: She is  alert and oriented to person, place, and time. No cranial nerve deficit. She exhibits normal muscle tone. Coordination normal.  Skin: Skin is warm and dry.  Psychiatric: She has a normal mood and affect.  Nursing note and vitals reviewed.   ED Course  Procedures (including critical care time)  DIAGNOSTIC STUDIES: Oxygen Saturation is 97% on room air, normal by my interpretation.    COORDINATION OF CARE: 2:16 PM Discussed treatment plan with patient at beside, the patient agrees with the plan and has no further questions at this time.   Labs Review Labs Reviewed  CBC WITH DIFFERENTIAL - Abnormal; Notable for the following:    Hemoglobin 11.7 (*)    MCH 25.6 (*)    RDW 19.8 (*)    All other components within normal limits  COMPREHENSIVE METABOLIC PANEL - Abnormal; Notable for the following:    Glucose, Bld 106 (*)    Creatinine, Ser 1.11 (*)    Albumin 3.2 (*)    GFR calc non Af Amer 43 (*)    GFR calc Af Amer 50 (*)    All other components within normal limits  LIPASE, BLOOD  URINALYSIS, ROUTINE W REFLEX MICROSCOPIC   Results for orders placed or performed during the hospital encounter of 01/15/14  CBC with Differential  Result Value Ref Range   WBC 7.5 4.0 - 10.5 K/uL   RBC 4.57 3.87 - 5.11 MIL/uL   Hemoglobin 11.7 (L) 12.0 - 15.0 g/dL   HCT 38.5 36.0 - 46.0 %   MCV 84.2 78.0 - 100.0 fL   MCH 25.6 (L) 26.0 - 34.0 pg   MCHC 30.4 30.0 - 36.0 g/dL   RDW 19.8 (H) 11.5 - 15.5 %   Platelets 351 150 - 400 K/uL   Neutrophils Relative % 71 43 - 77 %   Neutro Abs 5.3 1.7 - 7.7 K/uL   Lymphocytes Relative 16 12 - 46 %   Lymphs Abs 1.2 0.7 - 4.0 K/uL   Monocytes Relative 12 3 - 12 %   Monocytes Absolute 0.9 0.1 - 1.0 K/uL   Eosinophils Relative 1 0 - 5 %   Eosinophils Absolute 0.1 0.0 - 0.7 K/uL   Basophils Relative 0 0 - 1 %   Basophils Absolute 0.0 0.0 - 0.1 K/uL  Comprehensive metabolic panel  Result Value Ref Range   Sodium 140 137 - 147  mEq/L   Potassium 4.6 3.7 -  5.3 mEq/L   Chloride 101 96 - 112 mEq/L   CO2 29 19 - 32 mEq/L   Glucose, Bld 106 (H) 70 - 99 mg/dL   BUN 21 6 - 23 mg/dL   Creatinine, Ser 1.11 (H) 0.50 - 1.10 mg/dL   Calcium 9.5 8.4 - 10.5 mg/dL   Total Protein 7.6 6.0 - 8.3 g/dL   Albumin 3.2 (L) 3.5 - 5.2 g/dL   AST 19 0 - 37 U/L   ALT 9 0 - 35 U/L   Alkaline Phosphatase 110 39 - 117 U/L   Total Bilirubin 0.4 0.3 - 1.2 mg/dL   GFR calc non Af Amer 43 (L) >90 mL/min   GFR calc Af Amer 50 (L) >90 mL/min   Anion gap 10 5 - 15  Lipase, blood  Result Value Ref Range   Lipase 28 11 - 59 U/L    Imaging Review Dg Chest 2 View  01/15/2014   CLINICAL DATA:  Shortness of Breath.  Colon carcinoma  EXAM: CHEST  2 VIEW  COMPARISON:  December 13, 2013  FINDINGS: There is a mass in the superior segment of the right lower lobe measuring 5.0 x 4.4 x 4.3 cm. There is a mass in the superior segment of the left lower lobe measuring 3.0 x 2.4 by 2.2 cm. There is no edema or consolidation. Heart is mildly enlarged with pulmonary vascularity within normal limits. Pacemaker leads are attached to the right atrium and right ventricle. No adenopathy. No bone lesions.  IMPRESSION: Masses in the superior segment of each lower lobe. These masses appear larger than on recent prior study. Metastatic disease is felt to be present.  No edema or consolidation.  Stable cardiac enlargement.   Electronically Signed   By: Lowella Grip M.D.   On: 01/15/2014 16:20   Ct Abdomen Pelvis W Contrast  01/15/2014   CLINICAL DATA:  Shortness of breath. Abdominal pain. History of recurrent colon cancer on chemotherapy.  EXAM: CT ABDOMEN AND PELVIS WITH CONTRAST  TECHNIQUE: Multidetector CT imaging of the abdomen and pelvis was performed using the standard protocol following bolus administration of intravenous contrast.  CONTRAST:  23m OMNIPAQUE IOHEXOL 300 MG/ML SOLN, 83mOMNIPAQUE IOHEXOL 300 MG/ML SOLN  COMPARISON:  CT 11/30/2013  FINDINGS: Previously seen bilateral lower  lobe masses are not imaged on the CT abdomen. Heart is borderline in size. Dense coronary artery calcifications. Pacer wires noted in the right heart.  Tiny hypodensity seen in the inferior right hepatic lobe again noted and stable. Spleen, pancreas, adrenals have an unremarkable appearance. Stable small cyst in the midpole of the right kidney. No hydronephrosis.  Changes of right hemicolectomy. Surgical changes also noted in the sigmoid colon. There are dilated small bowel loops in the abdomen. Distal small bowel loops are decompressed. Findings compatible with small bowel obstruction. Transition is in the right lower quadrant, best seen on image 51 of series 2, where the out flowing small bowel loop appears thick-walled. This is of unknown etiology. This could reflect an area of wall thickening due to inflammation/infection. Tumor cannot be excluded. Stomach is unremarkable.  Aorta and iliac vessels are calcified, non aneurysmal. No free fluid or free air. Prior hysterectomy. No adnexal masses. Urinary bladder is unremarkable.  Decreasing presacral edema and soft tissue thickening, likely improving postsurgical changes. Soft tissue mass in the anterior abdominal wall in the region of the right rectus muscle is again noted measuring 2.9 x 2.1  cm compared with 2.5 x 2.3 cm previously.  Degenerative changes in the lumbar spine. No acute bony abnormality or focal bone lesion.  IMPRESSION: Dilated proximal small bowel loops with transition point in the right lower quadrant an area of circumferential wall thickening within the small bowel. Findings compatible with partial small bowel obstruction. Wall thickening could be related to inflammation/infection, but malignancy cannot be excluded.  Postsurgical changes in the right colon and sigmoid colon.  Anterior abdominal wall nodule again noted, possibly slightly larger.   Electronically Signed   By: Rolm Baptise M.D.   On: 01/15/2014 16:51     EKG  Interpretation   Date/Time:  Monday January 15 2014 14:56:00 EST Ventricular Rate:  67 PR Interval:  91 QRS Duration: 156 QT Interval:  608 QTC Calculation: 642 R Axis:   -63 Text Interpretation:  Sinus rhythm Short PR interval Right bundle branch  block LVH with IVCD and secondary repol abnrm Prolonged QT interval No  significant change since last tracing Confirmed by Tishawn Friedhoff  MD, Woodford Strege  [id number]) on 01/15/2014 3:20:07 PM      MDM   Final diagnoses:  SOB (shortness of breath)  Abdominal pain  Partial small bowel obstruction  Colon cancer   Patient with known history of metastatic colon cancer. Not amendable to any surgical repair. Patient presents here with a partial small bowel traction type symptoms and confirmed by CT scan. We'll discuss with general surgery for consultation while she is in the hospital. Will make arrangements to have her admitted by the hospitalist team. Primary care doctor is Dr. Laurance Flatten. Patient is known to have colon cancer metastatic throughout the abdomen and into the chest. Urinalysis is still pending.  Discussed with Dr. Arnoldo Morale he's not on call this week. already made arrangements for admission with the hospitalist team. Call placed out for Dr. Romona Curls is on call from surgery. He has not called back yet.  I personally performed the services described in this documentation, which was scribed in my presence. The recorded information has been reviewed and is accurate.     Fredia Sorrow, MD 01/15/14 1709  Fredia Sorrow, MD 01/15/14 2924

## 2014-01-15 NOTE — ED Notes (Signed)
Nausea, vomiting, Hx of cancer in stomach, liver, color.  No relief with zofran

## 2014-01-15 NOTE — Telephone Encounter (Signed)
Spoke with daughter about [facility]. After contacting, [facility]'s social worker Stephens November, pt Would be private pay if it was a direct admit from [place]. Pt is very sick and not eating. Instructed pt to go to ER per Chevis Pretty, social worker will assist with placement from there.

## 2014-01-15 NOTE — ED Notes (Signed)
Paged Dr. Harl Bowie to [phone]

## 2014-01-15 NOTE — ED Notes (Addendum)
Pt's daughters state the pt has metastatic colon cancer, this time her oncologist states it cannot be treated. The pt's family states that the pt has constant nausea, takes zofran for it with no relief. Pt will not eat or drink, PCP instructed the pt's family to bring her here to be admitted for FTT.

## 2014-01-15 NOTE — ED Notes (Signed)
Attempted to call report for bed assignment [id number]. Per Adonis Huguenin on 300 not the bed and will find charge nurse to assign bed. Waiting to hear back from 300.

## 2014-01-15 NOTE — H&P (Signed)
[facility] History and Physical  [name]:[id number] DOB: 03-13-1923 DOA: 01/15/2014  Referring physician: Dr Gennaro Africa - [facility] PCP: [name], MD   Chief Complaint: nausea and vomiting and loss of appetite  HPI: [name] is a 80 y.o. female    Pt seen at [facility] for intractable n/v over past 1-2 weeks. Given specific diet and zofran during that time w/o benefit. Becoming more frequent. Daughter spoke to staff today by phone who felt pt needed to come to ED. N/v worse w/ any food and even water. Unable keep anything down for several days. 2-3bouts of emesis per day. Emesis is non-bloody and bilious. Denies diarrhea, fevers.  Intermittent URI symptoms.   Review of Systems:  Constitutional:  weight loss, Denies night sweats, Fevers, chills, fatigue.  HEENT:  No headaches, Difficulty swallowing,Tooth/dental problems,Sore throat,  No sneezing, itching, ear ache,  Intermittent nasal congestion, post nasal drip,  Cardio-vascular:  No chest pain, Orthopnea, PND, swelling in lower extremities, anasarca, dizziness, palpitations  GI:  PER HPI Resp:  No shortness of breath with exertion or at rest. No excess mucus, no productive cough, No non-productive cough, No coughing up of blood.No change in color of mucus.No wheezing.No chest wall deformity  Skin:  no rash or lesions.  GU:  no dysuria, change in color of urine, no urgency or frequency. No flank pain.  Musculoskeletal:  No joint pain or swelling. No decreased range of motion. No back pain.  Psych:  No change in mood or affect. No depression or anxiety. No memory loss.   Past Medical History  Diagnosis Date  . Diastolic heart failure   . Arthritis   . History of Clostridium difficile infection     in Jan 2013  . Pacemaker   . History of cellulitis and abscess   . Hypertension     takes Ramipril,Diltiazem daily  . Atrial fibrillation     takes Coumadin daily  . Tachy-brady syndrome 2008    s/p PPM  implantation by Dr Oswald Hillock takes Betapace daily  . Osteoporosis     takes Prolia every 17months injections  . Shortness of breath     with exertion;Albuterol prn  . Bruises easily     takes COumadin daily  . GERD (gastroesophageal reflux disease)     takes Protonix daily  . Constipation     Milk of Mag nightly  . Anemia     takes Iron daily  . CHF (congestive heart failure)   . Ovarian cancer   . Colon cancer    Past Surgical History  Procedure Laterality Date  . Abdominal hysterectomy    . Oophorectomy    . Colonoscopy  03/30/2011    Procedure: COLONOSCOPY;  Surgeon: Missy Sabins, MD;  Location: [place];  Service: Endoscopy;  Laterality: N/A;  . Esophagogastroduodenoscopy  03/30/2011    Procedure: ESOPHAGOGASTRODUODENOSCOPY (EGD);  Surgeon: Missy Sabins, MD;  Location: [facility] ENDOSCOPY;  Service: Endoscopy;  Laterality: N/A;  . Colon resection  04/03/2011    Procedure: COLON RESECTION LAPAROSCOPIC;  Surgeon: Gayland Curry, MD;  Location: [place];  Service: General;  Laterality: N/A;  Laparoscopic, turned open sigmoid colon resection, lysis of adhesions x 2.5 hours, rigid proctoscopy.  . Pacemaker placement  2008    MDT EnRhythm implanted by Dr Leonia Reeves, generator change (MDT Adapta L) by Dr Rayann Heman 08/04/11  . Colonoscopy  12/24/2011    Procedure: COLONOSCOPY;  Surgeon: Rogene Houston, MD;  [facility];  Service: Endoscopy;  Laterality: N/A;  . Bilateral cataracts    . Battery changed in pacemaker    . Laparoscopic right hemi colectomy  02/19/2012    Procedure: LAPAROSCOPIC RIGHT HEMI COLECTOMY;  Surgeon: Gayland Curry, MD,FACS;  Location: [place];  Service: General;  Laterality: Right;  attempted laparoscopic assisted right hemi colectomy converted to open.  . Partial colectomy  02/19/2012    Procedure: PARTIAL COLECTOMY;  Surgeon: Gayland Curry, MD,FACS;  Location: [place];  Service: General;  Laterality: Right;  . Lysis of adhesion  02/19/2012    Procedure: LYSIS OF  ADHESION;  Surgeon: Gayland Curry, MD,FACS;  Location: [place];  Service: General;  Laterality: N/A;  . Esophagogastroduodenoscopy N/A 11/28/2013    Procedure: ESOPHAGOGASTRODUODENOSCOPY (EGD);  Surgeon: Jeryl Columbia, MD;  Location: [facility] ENDOSCOPY;  Service: Endoscopy;  Laterality: N/A;  . Colonoscopy with propofol N/A 11/29/2013    Procedure: COLONOSCOPY WITH PROPOFOL;  Surgeon: Cleotis Nipper, MD;  Location: [place];  Service: Endoscopy;  Laterality: N/A;   Social History:  reports that she has never smoked. She has never used smokeless tobacco. She reports that she does not drink alcohol or use illicit drugs.  Allergies  Allergen Reactions  . Amiodarone Other (See Comments)    pacemaker  . Diovan [Valsartan] Other (See Comments)  . Lasix [Furosemide] Swelling    Tolerate Bumex  . Penicillins Nausea And Vomiting and Swelling    Lip swelling and redness  . Sulfa Antibiotics Rash    Family History  Problem Relation Age of Onset  . Anesthesia problems Neg Hx   . Hypotension Neg Hx   . Malignant hyperthermia Neg Hx   . Pseudochol deficiency Neg Hx   . Cancer Mother     ovarian     Prior to Admission medications   Medication Sig Start Date End Date Taking? Authorizing Provider  aspirin EC 81 MG tablet Take 1 tablet (81 mg total) by mouth daily. 12/05/13  Yes Dawood Elgergawy, MD  calcium-vitamin D (OSCAL WITH D) 500-200 MG-UNIT per tablet Take 1 tablet by mouth 2 (two) times daily.   Yes Historical Provider, MD  diltiazem (CARDIZEM CD) 180 MG 24 hr capsule Take 180 mg by mouth daily.   Yes Historical Provider, MD  ferrous sulfate 325 (65 FE) MG tablet Take 1 tablet (325 mg total) by mouth 3 (three) times daily with meals. 11/24/13  Yes Sharion Balloon, FNP  magnesium hydroxide (MILK OF MAGNESIA) 400 MG/5ML suspension Take 30 mLs by mouth at bedtime.    Yes Historical Provider, MD  ondansetron (ZOFRAN) 4 MG tablet Take 1 tablet (4 mg total) by mouth every 8 (eight) hours as needed for  nausea or vomiting. 11/21/13  Yes Sharion Balloon, FNP  pantoprazole (PROTONIX) 40 MG tablet Take 1 tablet (40 mg total) by mouth daily. 09/18/13  Yes Laporcha-Margaret Hassell Done, FNP  sotalol (BETAPACE) 120 MG tablet Take 240 mg by mouth 2 (two) times daily.    Yes Historical Provider, MD   Physical Exam: Filed Vitals:   01/15/14 1253 01/15/14 1701  BP: 132/67 148/71  Pulse: 62 67  Temp: 99 F (37.2 C)   TempSrc: Oral   Resp: 18 17  Height: 5\' 9"  (1.753 m)   Weight: 83.915 kg (185 lb)   SpO2: 97% 94%    Wt Readings from Last 3 Encounters:  01/15/14 83.915 kg (185 lb)  12/25/13 86.637 kg (191 lb)  12/13/13 83.915 kg (185 lb)  General: Appears calm and comfortable Eyes:  PERRL, normal lids, irises & conjunctiva ENT: grossly normal hearing, lips & tongue Neck: no LAD, masses or thyromegaly Cardiovascular: RRR, no m/r/g. No LE edema. Telemetry:  SR, no arrhythmias  Respiratory:  CTA bilaterally, no w/r/r. Normal respiratory effort. Abdomen:  soft, ntnd, hypoactive bowel sounds Skin:  no rash or induration seen on limited exam Musculoskeletal:  grossly normal tone BUE/BLE Psychiatric:  grossly normal mood and affect, speech fluent and appropriate Neurologic:  grossly non-focal.          Labs on Admission:  Basic Metabolic Panel:  Recent Labs Lab 01/15/14 1307  NA 140  K 4.6  CL 101  CO2 29  GLUCOSE 106*  BUN 21  CREATININE 1.11*  CALCIUM 9.5   Liver Function Tests:  Recent Labs Lab 01/15/14 1307  AST 19  ALT 9  ALKPHOS 110  BILITOT 0.4  PROT 7.6  ALBUMIN 3.2*    Recent Labs Lab 01/15/14 1307  LIPASE 28   No results for input(s): AMMONIA in the last 168 hours. CBC:  Recent Labs Lab 01/15/14 1307  WBC 7.5  NEUTROABS 5.3  HGB 11.7*  HCT 38.5  MCV 84.2  PLT 351   Cardiac Enzymes: No results for input(s): CKTOTAL, CKMB, CKMBINDEX, TROPONINI in the last 168 hours.  BNP (last 3 results) No results for input(s): PROBNP in the last 8760  hours. CBG: No results for input(s): GLUCAP in the last 168 hours.  Radiological Exams on Admission: Dg Chest 2 View  01/15/2014   CLINICAL DATA:  Shortness of Breath.  Colon carcinoma  EXAM: CHEST  2 VIEW  COMPARISON:  December 13, 2013  FINDINGS: There is a mass in the superior segment of the right lower lobe measuring 5.0 x 4.4 x 4.3 cm. There is a mass in the superior segment of the left lower lobe measuring 3.0 x 2.4 by 2.2 cm. There is no edema or consolidation. Heart is mildly enlarged with pulmonary vascularity within normal limits. Pacemaker leads are attached to the right atrium and right ventricle. No adenopathy. No bone lesions.  IMPRESSION: Masses in the superior segment of each lower lobe. These masses appear larger than on recent prior study. Metastatic disease is felt to be present.  No edema or consolidation.  Stable cardiac enlargement.   Electronically Signed   By: Lowella Grip M.D.   On: 01/15/2014 16:20   Ct Abdomen Pelvis W Contrast  01/15/2014   CLINICAL DATA:  Shortness of breath. Abdominal pain. History of recurrent colon cancer on chemotherapy.  EXAM: CT ABDOMEN AND PELVIS WITH CONTRAST  TECHNIQUE: Multidetector CT imaging of the abdomen and pelvis was performed using the standard protocol following bolus administration of intravenous contrast.  CONTRAST:  57mL OMNIPAQUE IOHEXOL 300 MG/ML SOLN, 30mL OMNIPAQUE IOHEXOL 300 MG/ML SOLN  COMPARISON:  CT 11/30/2013  FINDINGS: Previously seen bilateral lower lobe masses are not imaged on the CT abdomen. Heart is borderline in size. Dense coronary artery calcifications. Pacer wires noted in the right heart.  Tiny hypodensity seen in the inferior right hepatic lobe again noted and stable. Spleen, pancreas, adrenals have an unremarkable appearance. Stable small cyst in the midpole of the right kidney. No hydronephrosis.  Changes of right hemicolectomy. Surgical changes also noted in the sigmoid colon. There are dilated small bowel  loops in the abdomen. Distal small bowel loops are decompressed. Findings compatible with small bowel obstruction. Transition is in the right lower quadrant, best seen on image 51  of series 2, where the out flowing small bowel loop appears thick-walled. This is of unknown etiology. This could reflect an area of wall thickening due to inflammation/infection. Tumor cannot be excluded. Stomach is unremarkable.  Aorta and iliac vessels are calcified, non aneurysmal. No free fluid or free air. Prior hysterectomy. No adnexal masses. Urinary bladder is unremarkable.  Decreasing presacral edema and soft tissue thickening, likely improving postsurgical changes. Soft tissue mass in the anterior abdominal wall in the region of the right rectus muscle is again noted measuring 2.9 x 2.1 cm compared with 2.5 x 2.3 cm previously.  Degenerative changes in the lumbar spine. No acute bony abnormality or focal bone lesion.  IMPRESSION: Dilated proximal small bowel loops with transition point in the right lower quadrant an area of circumferential wall thickening within the small bowel. Findings compatible with partial small bowel obstruction. Wall thickening could be related to inflammation/infection, but malignancy cannot be excluded.  Postsurgical changes in the right colon and sigmoid colon.  Anterior abdominal wall nodule again noted, possibly slightly larger.   Electronically Signed   By: Rolm Baptise M.D.   On: 01/15/2014 16:51    EKG: Independently reviewed. Paced, RBBB - no significant change from previous  Assessment/Plan Principal Problem:   Partial small bowel obstruction Active Problems:   Hypertension   Atrial fibrillation   IBS (irritable bowel syndrome)   Anemia   Tachycardia-bradycardia   Colon cancer   Lung cancer   GERD (gastroesophageal reflux disease)  Partial Small bowel obstruction: Ct showing partial SBO. Pt w/ several months of intermittent intractable N/V. Likely all related to colon cancer  s/p resection. Told to come to [facility] by PCP as unable to resolve as outpt. Pt becoming progressively weaker as unable to keep down PO and w/ poor appetite.  - Admit  - NG tube to intermittent suction - Dr. Romona Curls or general surgery consulted by ED and will see pt in the am - phenergan  Social: Lengthy end of life discussion had w/ pt and family. Symptoms likely the beginning of progressive decline.  - palliative care consult (hospice?)  PHysical deconditioning: likely from progression of cancer and poor PO. Wt down 12lbs in past 2 mo.  - PT/OT - nutrition consult once taking PO.   Lung Cancer: likely metastatic disease from colon cancer. No O2.  - continue outpt workup per oncology - O2 PRN  Renal insufficiency: Cr. 1.11 on admission. Baseline 0.9. Likely secondary to dehydration given principal problem above.  - NS 72ml/hr - BMET in am  HTN: normotensive on admission - continue dilt and sotalol  Afib adn Tachy Brady: currently in NSR. Pacemaker in place - continue dilt and sotalol.  - Tele - cont ASA  GERD:  - continue PPI  Code Status: DNR DVT Prophylaxis: Hep [place] TID Family Communication: daughter and daughter in law present for admission Disposition Plan: pending improvement  Time spent: >70 min in direct pt care adn coordination  [place], [facility] [email or web address] Password [email or web address]

## 2014-01-16 ENCOUNTER — Inpatient Hospital Stay (HOSPITAL_COMMUNITY): Payer: Medicare Other

## 2014-01-16 LAB — OCCULT BLOOD X 1 CARD TO LAB, STOOL: FECAL OCCULT BLD: POSITIVE — AB

## 2014-01-16 MED ORDER — METOPROLOL TARTRATE 1 MG/ML IV SOLN
2.5000 mg | INTRAVENOUS | Status: DC | PRN
  Filled 2014-01-16: qty 5

## 2014-01-16 MED ORDER — HYDRALAZINE HCL 20 MG/ML IJ SOLN: 10.0000 mg | Freq: Four times a day (QID) | INTRAMUSCULAR | Status: DC | PRN

## 2014-01-16 NOTE — Progress Notes (Signed)
INITIAL NUTRITION ASSESSMENT  DOCUMENTATION CODES Per approved criteria  -Not Applicable   INTERVENTION: RD to follow for diet advancement  NUTRITION DIAGNOSIS: Inadequate oral intake related to altered GI function as evidenced by NPO.   Goal: Pt will meet >90% of estimated nutritional needs  Monitor:  Diet advancement, PO intake, labs, weight changes, I/O's  Reason for Assessment: MST=2  80 y.o. female  Admitting Dx: Partial small bowel obstruction  80 y/o female with PMH of HTN, CHF, PAF, h/o colon CA s/p colectomy, presented with recurrent nausea, vomiting, abdominal pain and admitted with SBO  ASSESSMENT: Pt admitted with partial SBO.  Pt unavailable at time of visit. Nutrition-focused physical exam deferred at this time.  Chart review reports poor appetite PTA due to intractable n/v. Noted a 6# wt loss over the past month, which is not significant for time frame.  Pt pulled out NGT this AM, but was reinserted by RN.  Labs reviewed. Creat: 1.11, Glucose: 106.   Height: Ht Readings from Last 1 Encounters:  01/15/14 5\' 9"  (1.753 m)    Weight: Wt Readings from Last 1 Encounters:  01/15/14 185 lb (83.915 kg)    Ideal Body Weight: 145#  % Ideal Body Weight: 128%  Wt Readings from Last 10 Encounters:  01/15/14 185 lb (83.915 kg)  12/25/13 191 lb (86.637 kg)  12/13/13 185 lb (83.915 kg)  11/27/13 192 lb 10.9 oz (87.4 kg)  11/27/13 197 lb (89.359 kg)  11/24/13 197 lb (89.359 kg)  11/21/13 194 lb (87.998 kg)  09/18/13 201 lb 6.4 oz (91.354 kg)  09/04/13 199 lb (90.266 kg)  03/01/13 196 lb (88.905 kg)    Usual Body Weight: 195#  % Usual Body Weight: 95%  BMI:  Body mass index is 27.31 kg/(m^2). Overweight  Estimated Nutritional Needs: Kcal: 1600-1800 Protein: 84-94 grams Fluid: 1.6-1.8 L  Skin: WDL  Diet Order: Diet NPO time specified  EDUCATION NEEDS: -Education not appropriate at this time  No intake or output data in the 24 hours ending  01/16/14 0918  Last BM: PTA  Labs:   Recent Labs Lab 01/15/14 1307  NA 140  K 4.6  CL 101  CO2 29  BUN 21  CREATININE 1.11*  CALCIUM 9.5  GLUCOSE 106*    CBG (last 3)  No results for input(s): GLUCAP in the last 72 hours.  Scheduled Meds: . aspirin EC  81 mg Oral Daily  . diltiazem  180 mg Oral Daily  . heparin  5,000 Units Subcutaneous 3 times per day  . pantoprazole (PROTONIX) IV  40 mg Intravenous Q24H  . sodium chloride  3 mL Intravenous Q12H  . sotalol  240 mg Oral BID    Continuous Infusions: . sodium chloride      Past Medical History  Diagnosis Date  . Diastolic heart failure   . Arthritis   . History of Clostridium difficile infection     in Jan 2013  . Pacemaker   . History of cellulitis and abscess   . Hypertension     takes Ramipril,Diltiazem daily  . Atrial fibrillation     takes Coumadin daily  . Tachy-brady syndrome 2008    s/p PPM implantation by Dr Oswald Hillock takes Betapace daily  . Osteoporosis     takes Prolia every 53months injections  . Shortness of breath     with exertion;Albuterol prn  . Bruises easily     takes COumadin daily  . GERD (gastroesophageal reflux disease)     takes  Protonix daily  . Constipation     Milk of Mag nightly  . Anemia     takes Iron daily  . CHF (congestive heart failure)   . Ovarian cancer   . Colon cancer     Past Surgical History  Procedure Laterality Date  . Abdominal hysterectomy    . Oophorectomy    . Colonoscopy  03/30/2011    Procedure: COLONOSCOPY;  Surgeon: Missy Sabins, MD;  Location: [place];  Service: Endoscopy;  Laterality: N/A;  . Esophagogastroduodenoscopy  03/30/2011    Procedure: ESOPHAGOGASTRODUODENOSCOPY (EGD);  Surgeon: Missy Sabins, MD;  Location: [facility] ENDOSCOPY;  Service: Endoscopy;  Laterality: N/A;  . Colon resection  04/03/2011    Procedure: COLON RESECTION LAPAROSCOPIC;  Surgeon: Gayland Curry, MD;  Location: [place];  Service: General;  Laterality: N/A;  Laparoscopic,  turned open sigmoid colon resection, lysis of adhesions x 2.5 hours, rigid proctoscopy.  . Pacemaker placement  2008    MDT EnRhythm implanted by Dr Leonia Reeves, generator change (MDT Adapta L) by Dr Rayann Heman 08/04/11  . Colonoscopy  12/24/2011    Procedure: COLONOSCOPY;  Surgeon: Rogene Houston, MD;  [facility];  Service: Endoscopy;  Laterality: N/A;  . Bilateral cataracts    . Battery changed in pacemaker    . Laparoscopic right hemi colectomy  02/19/2012    Procedure: LAPAROSCOPIC RIGHT HEMI COLECTOMY;  Surgeon: Gayland Curry, MD,FACS;  Location: [place];  Service: General;  Laterality: Right;  attempted laparoscopic assisted right hemi colectomy converted to open.  . Partial colectomy  02/19/2012    Procedure: PARTIAL COLECTOMY;  Surgeon: Gayland Curry, MD,FACS;  Location: [place];  Service: General;  Laterality: Right;  . Lysis of adhesion  02/19/2012    Procedure: LYSIS OF ADHESION;  Surgeon: Gayland Curry, MD,FACS;  Location: [place];  Service: General;  Laterality: N/A;  . Esophagogastroduodenoscopy N/A 11/28/2013    Procedure: ESOPHAGOGASTRODUODENOSCOPY (EGD);  Surgeon: Jeryl Columbia, MD;  Location: [facility] ENDOSCOPY;  Service: Endoscopy;  Laterality: N/A;  . Colonoscopy with propofol N/A 11/29/2013    Procedure: COLONOSCOPY WITH PROPOFOL;  Surgeon: Cleotis Nipper, MD;  Location: [place];  Service: Endoscopy;  Laterality: N/A;    Zorawar Strollo A. Jimmye Norman, RD, LDN Pager: [phone] After hours Pager: [phone]

## 2014-01-16 NOTE — Progress Notes (Signed)
Palliative medicine has received consult but unfortunately we are not currently staffed to provide palliative services at the [facility]. However, this is a service that we are hoping to provide in the near future. I will sign off but please call [phone] for any guidance regarding symptom management or palliative needs that we may be able to provide you via telephone. I discussed this with Dr. Daleen Bo. Thank you.   Vinie Sill, NP Palliative Medicine Team Pager # [phone] (M-F 8a-5p) Team Phone # [phone] (Nights/Weekends)

## 2014-01-16 NOTE — Progress Notes (Signed)
UR chart review completed.  

## 2014-01-16 NOTE — Evaluation (Signed)
Physical Therapy Evaluation Patient Details Name: [name] MRN: [id number] DOB: [birth year] Today's Date: 01/16/2014   History of Present Illness  Pt is an 80 year old female who was seen at [facility] for intractable n/v over past 1-2 weeks. Given specific diet and zofran during that time w/o benefit. Becoming more frequent. Daughter spoke to staff today by phone who felt pt needed to come to ED. N/v worse w/ any food and even water. Unable keep anything down for several days. 2-3bouts of emesis per day. Emesis is non-bloody and bilious. Denies diarrhea, fevers.    Clinical Impression  Pt is an 80 year female presents to PT with partial small bowel obstruction.  Pt reports she has caretakers and family members who are able to assist as needed at discharge.  During evaluation, pt was mod (I) with bed mobility skills, transfers and min guard/supervision and use of quad cane for gait of 100 feet.  Gait distance limited by fatigue.  Recommend continued PT at next venue of care with HHPT to address activity tolerance and safety with functional mobility skills in home setting; pt to be discharged from acute PT services.  No DME recommendations.      Follow Up Recommendations Home health PT    Equipment Recommendations  None recommended by PT    Recommendations for Other Services OT consult     Precautions / Restrictions Precautions Precautions: Fall Precaution Comments: NG tube Restrictions Weight Bearing Restrictions: No      Mobility  Bed Mobility Overal bed mobility: Modified Independent                Transfers Overall transfer level: Modified independent Equipment used: Quad cane                Ambulation/Gait Ambulation/Gait assistance: Supervision;Min guard Ambulation Distance (Feet): 100 Feet Assistive device: Quad cane Gait Pattern/deviations: Step-through pattern;Decreased stride length;Decreased dorsiflexion - right;Decreased dorsiflexion - left;Trunk flexed   Gait velocity interpretation: Below normal speed for age/gender       Balance Overall balance assessment: Needs assistance Sitting-balance support: No upper extremity supported;Feet supported Sitting balance-Leahy Scale: Normal     Standing balance support: Single extremity supported;During functional activity Standing balance-Leahy Scale: Fair                               Pertinent Vitals/Pain Pain Assessment: No/denies pain    Home Living Family/patient expects to be discharged to:: Private residence Living Arrangements: Alone (Pt reports having aides that stay with her during the day and some nights; family stays with pt on the weekends and nights as needed) Available Help at Discharge: Family;Personal care attendant;Available 24 hours/day Type of Home: House Home Access: Ramped entrance     Home Layout: One level Home Equipment: Grab bars - toilet;Grab bars - tub/shower;Cane - quad;Walker - 4 wheels;Wheelchair - manual Additional Comments: Tub shower    Prior Function Level of Independence: Needs assistance   Gait / Transfers Assistance Needed: Pt reports she is mod (I) with bed mobility skills, transfers, and household amb.  Pt reports she primarily uses her quad cane, though does have a rollator to use sometimes.   ADL's / Homemaking Assistance Needed: Assist from caretaker for cooking, cleaning, bathing, dressing.         Hand Dominance   Dominant Hand: Right    Extremity/Trunk Assessment   Upper Extremity Assessment: Defer to OT evaluation  Lower Extremity Assessment: Generalized weakness         Communication   Communication: No difficulties  Cognition Arousal/Alertness: Awake/alert Behavior During Therapy: WFL for tasks assessed/performed Overall Cognitive Status: Within Functional Limits for tasks assessed                       Assessment/Plan    PT Assessment All further PT needs can be met in the next  venue of care  PT Diagnosis Generalized weakness;Abnormality of gait   PT Problem List Decreased strength;Decreased activity tolerance;Decreased mobility  PT Treatment Interventions     PT Goals (Current goals can be found in the Care Plan section) Acute Rehab PT Goals PT Goal Formulation: All assessment and education complete, DC therapy     End of Session Equipment Utilized During Treatment: Gait belt Activity Tolerance: Patient limited by fatigue Patient left: in chair;with call bell/phone within reach;with chair alarm set           Time: [phone] PT Time Calculation (min): 21 min   Charges:   PT Evaluation $Initial PT Evaluation Tier I: 1 Procedure          Arvilla Salada 01/16/2014, 9:52 AM

## 2014-01-16 NOTE — Progress Notes (Signed)
Pt pulled out NG tube.  Reinserted NG tube, placement verified by two nurses, order placed for chest x-ray to check placement.

## 2014-01-16 NOTE — Care Management Note (Unsigned)
    Page 1 of 2   01/19/2014     11:47:15 AM CARE MANAGEMENT NOTE 01/19/2014  Patient:  [name], [name]   Account Number:  [id number]  Date Initiated:  01/16/2014  Documentation initiated by:  Theophilus Kinds  Subjective/Objective Assessment:   Pt admitted from home with partial SBO. Pt lives alone and has a CAP aide 3 hours a day, M-F. Pt also has a sitter that spends the night. Pt has a cane and walker for home use. Family is interested in placement. Pt also has [place] [facility].     Action/Plan:   PT is recommending HH PT. CM explained to pt and family that Medicare will not cover SNF at this time. Will continue to follow for discharge planning needs.   Anticipated DC Date:  01/19/2014   Anticipated DC Plan:  [place]  CM consult      [facility] Choice  Resumption Of Svcs/PTA Provider   Choice offered to / List presented to:          [facility] arranged  [place] RN      [place].   Status of service:  In process, will continue to follow Medicare Important Message given?  YES (If response is "NO", the following Medicare IM given date fields will be blank) Date Medicare IM given:  01/19/2014 Medicare IM given by:  Tomi Bamberger Date Additional Medicare IM given:   Additional Medicare IM given by:    Discharge Disposition:    Per UR Regulation:  Reviewed for med. necessity/level of care/duration of stay  If discussed at [place] of Stay Meetings, dates discussed:    Comments:  01/16/14 [place], RN BSN CM  01/19/14 [place], BSN  [phone] patient has cap  services and she is active with [place] and Pt, will resume.

## 2014-01-16 NOTE — Progress Notes (Signed)
Surgery  Pt admitted via ER ~ 7:00 AM I was not contacted by the ER MD or the hospitalist until  1:15PM today.  In essence, 77 yr. Old W. Female with hx of CA of the colon operated 4 yrs ago and then later for recurrence. ( She has also had hx of ovarian CA in 1994.) She has mets to both lungs.  She was admitted with partial SBO form which it appears she is clinically improving as she is passing flatus and has tolerated clear liquids today and her plane film of the abdomen shows improvement.  Her abdomen is soft and bowel sounds are present.  Stool was checked for blood and is guaiac positive.   The family states that she has recently undergone a colonoscopy and that there is residual tumor in the area of the anastomosis and this thickening is seen on CT. They further state that surgery was being contemplated at [facility] just before she presented with her present partial SBO.  Family would like her to be transferred to [facility] where surgery can be again considered--perhaps a diverting ileostomy would be a preferable paliative approach .At any rate the pt also has hx of cardiac problems related to chemo.  She has a pacemaker.  Xrays and labs reviewed.   Clinically she is stable from surgical point of view and I have told the nurse to clamp NG and have discussed case with hospitalist who will plan to transfer IN AM.  Consult dictated, dict #  [phone]

## 2014-01-16 NOTE — Progress Notes (Signed)
Assume care for patient. NG tube intact and clamped at this time. Denies any c/o pain at this time. Son at bedside.

## 2014-01-16 NOTE — Progress Notes (Signed)
[facility] PROGRESS NOTE  [name] EWY:[id number] DOB: [birth year] DOA: 01/15/2014 PCP: [name], MD  D/w patient, her family, and Dr. Romona Curls who recommended to TF patient to [facility], since all surgeries were performed at [facility] -d/w Dr. Brantley Stage from  [place] surgery who kindly agreed to be on consult and follow up upon patient arrival to [facility]  -d/w Dr. Sloan Leiter at [facility]; Please notify surgery upon patient 's arrival to [place], [place] [facility] Pager [phone]. If 7PM-7AM, please contact night-coverage at [email or web address], password [facility] 01/16/2014, 4:14 PM  LOS: 1 day

## 2014-01-16 NOTE — Progress Notes (Signed)
[facility] PROGRESS NOTE  [name] YKD:[id number] DOB: 10/22/1922 DOA: 01/15/2014 PCP: [name], MD  Assessment/Plan:  80 y/o female with PMH of HTN, CHF, PAF, h/o colon CA s/p colectomy, presented with recurrent nausea, vomiting, abdominal pain and admitted with SBO  1. Partial Small bowel obstruction: Ct showing partial SBO.  -Pt w/ several months of intermittent intractable N/V. Likely all related to colon cancer s/p resection.  - no flatus, BM, but + BS;  Cont NG tube to intermittent suction - cont antiemetics, IVF, pain control; awaiting surgery eval   2. Probable metastatic CA; h/o colon CA, lung Lung Cancer: likely metastatic disease from colon cancer.  - continue outpt workup per oncology;   3. HTN: prn IV hydrazine while NPO  4. Afib adn Tachy Brady: currently in NSR. Pacemaker in place - Pt is on dilt and sotalol. prn BB while NPO;   5. GERD:  continue PPI  Code Status: DNR Family Communication: d/w patient, no family at the bedside  (indicate person spoken with, relationship, and if by phone, the number) Disposition Plan: pend clinical improvement, PT   Consultants:  Surgery   Procedures:  none  Antibiotics:  none (indicate start date, and stop date if known)  HPI/Subjective: alert  Objective: Filed Vitals:   01/16/14 0530  BP: 162/81  Pulse: 97  Temp: 98 F (36.7 C)  Resp: 20   No intake or output data in the 24 hours ending 01/16/14 0830 Filed Weights   01/15/14 1253 01/15/14 1841  Weight: 83.915 kg (185 lb) 83.915 kg (185 lb)    Exam:   General:  alert  Cardiovascular: s1,s2 rrr  Respiratory: CTA BL  Abdomen: soft, nt,nd   Musculoskeletal: no LE edema    Data Reviewed: Basic Metabolic Panel:  Recent Labs Lab 01/15/14 1307  NA 140  K 4.6  CL 101  CO2 29  GLUCOSE 106*  BUN 21  CREATININE 1.11*  CALCIUM 9.5   Liver Function Tests:  Recent Labs Lab 01/15/14 1307  AST 19  ALT 9  ALKPHOS 110   BILITOT 0.4  PROT 7.6  ALBUMIN 3.2*    Recent Labs Lab 01/15/14 1307  LIPASE 28   No results for input(s): AMMONIA in the last 168 hours. CBC:  Recent Labs Lab 01/15/14 1307  WBC 7.5  NEUTROABS 5.3  HGB 11.7*  HCT 38.5  MCV 84.2  PLT 351   Cardiac Enzymes: No results for input(s): CKTOTAL, CKMB, CKMBINDEX, TROPONINI in the last 168 hours. BNP (last 3 results) No results for input(s): PROBNP in the last 8760 hours. CBG: No results for input(s): GLUCAP in the last 168 hours.  No results found for this or any previous visit (from the past 240 hour(s)).   Studies: Dg Chest 2 View  01/15/2014   CLINICAL DATA:  Shortness of Breath.  Colon carcinoma  EXAM: CHEST  2 VIEW  COMPARISON:  December 13, 2013  FINDINGS: There is a mass in the superior segment of the right lower lobe measuring 5.0 x 4.4 x 4.3 cm. There is a mass in the superior segment of the left lower lobe measuring 3.0 x 2.4 by 2.2 cm. There is no edema or consolidation. Heart is mildly enlarged with pulmonary vascularity within normal limits. Pacemaker leads are attached to the right atrium and right ventricle. No adenopathy. No bone lesions.  IMPRESSION: Masses in the superior segment of each lower lobe. These masses appear larger than on recent prior study. Metastatic disease is  felt to be present.  No edema or consolidation.  Stable cardiac enlargement.   Electronically Signed   By: Lowella Grip M.D.   On: 01/15/2014 16:20   Dg Abd 1 View  01/15/2014   CLINICAL DATA:  Nausea vomiting today. Partial small bowel obstruction on an abdomen and pelvis CT earlier today. Nasogastric tube placement.  EXAM: ABDOMEN - 1 VIEW  COMPARISON:  Abdomen and pelvis CT obtained earlier today.  FINDINGS: Nasogastric tube tip in the region of the gastric pylorus or duodenal bulb. CT oral contrast in normal caliber and dilated small bowel loops. Left pelvic and lower abdominal surgical clips. Lumbar spine degenerative changes and mild  scoliosis.  IMPRESSION: Stable pattern of partial small bowel obstruction. Nasogastric tube tip in the gastric pylorus or duodenal bulb.   Electronically Signed   By: Enrique Sack M.D.   On: 01/15/2014 21:15   Ct Abdomen Pelvis W Contrast  01/15/2014   CLINICAL DATA:  Shortness of breath. Abdominal pain. History of recurrent colon cancer on chemotherapy.  EXAM: CT ABDOMEN AND PELVIS WITH CONTRAST  TECHNIQUE: Multidetector CT imaging of the abdomen and pelvis was performed using the standard protocol following bolus administration of intravenous contrast.  CONTRAST:  56mL OMNIPAQUE IOHEXOL 300 MG/ML SOLN, 67mL OMNIPAQUE IOHEXOL 300 MG/ML SOLN  COMPARISON:  CT 11/30/2013  FINDINGS: Previously seen bilateral lower lobe masses are not imaged on the CT abdomen. Heart is borderline in size. Dense coronary artery calcifications. Pacer wires noted in the right heart.  Tiny hypodensity seen in the inferior right hepatic lobe again noted and stable. Spleen, pancreas, adrenals have an unremarkable appearance. Stable small cyst in the midpole of the right kidney. No hydronephrosis.  Changes of right hemicolectomy. Surgical changes also noted in the sigmoid colon. There are dilated small bowel loops in the abdomen. Distal small bowel loops are decompressed. Findings compatible with small bowel obstruction. Transition is in the right lower quadrant, best seen on image 51 of series 2, where the out flowing small bowel loop appears thick-walled. This is of unknown etiology. This could reflect an area of wall thickening due to inflammation/infection. Tumor cannot be excluded. Stomach is unremarkable.  Aorta and iliac vessels are calcified, non aneurysmal. No free fluid or free air. Prior hysterectomy. No adnexal masses. Urinary bladder is unremarkable.  Decreasing presacral edema and soft tissue thickening, likely improving postsurgical changes. Soft tissue mass in the anterior abdominal wall in the region of the right rectus  muscle is again noted measuring 2.9 x 2.1 cm compared with 2.5 x 2.3 cm previously.  Degenerative changes in the lumbar spine. No acute bony abnormality or focal bone lesion.  IMPRESSION: Dilated proximal small bowel loops with transition point in the right lower quadrant an area of circumferential wall thickening within the small bowel. Findings compatible with partial small bowel obstruction. Wall thickening could be related to inflammation/infection, but malignancy cannot be excluded.  Postsurgical changes in the right colon and sigmoid colon.  Anterior abdominal wall nodule again noted, possibly slightly larger.   Electronically Signed   By: Rolm Baptise M.D.   On: 01/15/2014 16:51    Scheduled Meds: . aspirin EC  81 mg Oral Daily  . diltiazem  180 mg Oral Daily  . heparin  5,000 Units Subcutaneous 3 times per day  . pantoprazole (PROTONIX) IV  40 mg Intravenous Q24H  . sodium chloride  3 mL Intravenous Q12H  . sotalol  240 mg Oral BID   Continuous Infusions: .  sodium chloride      Principal Problem:   Partial small bowel obstruction Active Problems:   Hypertension   Atrial fibrillation   IBS (irritable bowel syndrome)   Anemia   Tachycardia-bradycardia   Colon cancer   Lung cancer   GERD (gastroesophageal reflux disease)    Time spent: >35 minutes     Kinnie Feil  [facility] Pager [phone]. If 7PM-7AM, please contact night-coverage at [email or web address], password [facility] 01/16/2014, 8:30 AM  LOS: 1 day

## 2014-01-16 NOTE — Progress Notes (Signed)
Patient room assignment at [facility]. [facility] to provide transportation to [facility]. Waiting for [facility] to call for report. Family aware

## 2014-01-17 DIAGNOSIS — C3490 Malignant neoplasm of unspecified part of unspecified bronchus or lung: Secondary | ICD-10-CM

## 2014-01-17 NOTE — Plan of Care (Signed)
Problem: Consults Goal: General Medical Patient Education See Patient Education Module for specific education.  Outcome: Completed/Met Date Met:  01/17/14 Goal: Skin Care Protocol Initiated - if Braden Score 18 or less If consults are not indicated, leave blank or document N/A  Outcome: Completed/Met Date Met:  01/17/14 Goal: Diabetes Guidelines if Diabetic/Glucose > 140 If diabetic or lab glucose is > 140 mg/dl - Initiate Diabetes/Hyperglycemia Guidelines & Document Interventions  Outcome: Not Applicable Date Met:  72/90/21  Problem: Phase I Progression Outcomes Goal: Pain controlled with appropriate interventions Outcome: Completed/Met Date Met:  01/17/14 Goal: OOB as tolerated unless otherwise ordered Outcome: Completed/Met Date Met:  01/17/14 Goal: Voiding-avoid urinary catheter unless indicated Outcome: Not Applicable Date Met:  11/55/20  Problem: Phase II Progression Outcomes Goal: Progress activity as tolerated unless otherwise ordered Outcome: Completed/Met Date Met:  01/17/14 Goal: Obtain order to discontinue catheter if appropriate Outcome: Not Applicable Date Met:  80/22/33  Problem: Phase III Progression Outcomes Goal: Pain controlled on oral analgesia Outcome: Completed/Met Date Met:  01/17/14 Goal: Foley discontinued Outcome: Not Applicable Date Met:  61/22/44  Problem: Discharge Progression Outcomes Goal: Pain controlled with appropriate interventions Outcome: Completed/Met Date Met:  01/17/14

## 2014-01-17 NOTE — Consult Note (Signed)
[name], FREY NO.:  [id number]  MEDICAL RECORD NO.:  [id number]  LOCATION:  [id number]                        FACILITY:  [facility]  PHYSICIAN:  Felicie Morn, M.D. DATE OF BIRTH:  1922-04-21  DATE OF CONSULTATION:  01/16/2014 DATE OF DISCHARGE:                                CONSULTATION   NOTE:  Surgery was asked to see this 80 year old white female for her partial small-bowel obstruction.  HISTORY OF PRESENT MEDICAL ILLNESS:  The patient states that she within the last couple of days had developed symptoms suggestive of a small- bowel obstruction with some nausea, for which, she received an injection by Dr. Laurance Flatten and she continued to have some nausea, bloating, and became obstipated.  She was seen through the emergency room and admitted to the Hospitalist staff.  Neither the emergency room nor the Hospitalist Service consulted me until 1:15 today.  In essence, this 80 year old white female had a history of colon cancer, stage IV.  She was operated on approximately 4 years ago for her first surgery and then approximately 3 years ago for her second surgery where she had recurrence.  She underwent lengthy bowel surgery during those times in [facility] and on follow up recently, she was found to have on colonoscopy residual tumor in the area of her anastomosis.  This is likely causing the present problem with some partial small-bowel obstruction.  We reviewed the CT scan and there was thickening in this area and this likely represents what was seen on colonoscopy.  She also fortunately shows improvement from her plain films done yesterday and then comparing them to the films today, they show contrast down in the colon and clinically her abdomen is soft with bowel sounds being present in the patient passing flatus and she has tolerated liquids today.  This being the case, clinically, I believe that this small-bowel obstruction for the moment at least  resolving, but given her history is likely to recur.  This was discussed in detail with the family and since they had undergone lengthy abdominal surgery in [facility], request to go back to [facility] where in fact surgery was contemplated after the above finding from colonoscopy.  This makes sense to at least as palliative procedure or offer her some perhaps diverting ileostomy or something that would prevent her from succumbing to an obstructed perforated colon.  Another reason for her transfer the makes sense as well as that she apparently had some myocardial damage from chemotherapy for treatment of an ovarian cancer in 1994.  She presently also has lung mets in both lungs.  This was discussed in detail with the family and her present hospitalist and plans are made for transfer in the morning.  PHYSICAL EXAMINATION:  VITAL SIGNS:  She is approximately 5 foot and 8 inches and weight is probably 180 pounds.  Her temperature is 98.0, pulse was 97, respirations 20, O2 sat is 96%. HEENT:  Head is normocephalic.  Eyes; extraocular movements are intact. Pupils are round and reactive to light and accommodation.  Nasal and oral mucosa are moist.  She has an NG tube placed. NECK:  Her neck shows no jugular vein distention.  She has no palpable cervical adenopathy.  No supra or infraclavicular adenopathy. BREASTS:  Without masses. LUNGS:  Appear fairly clear. ABDOMEN:  Soft.  Bowel sounds are present.  She has a palpable mass that is free moving in the right upper quadrant.  She has no incisional hernias appreciated. RECTAL:  There is guaiac positive stool and very little stool within the ampulla. EXTREMITIES:  Grossly within normal limits.  LABORATORY DATA:  Laboratory data was reviewed.  In essence, her metabolic 7 was grossly within normal limits.  Her creatinine is 1.1, her albumin is 3.2.  Her liver function studies are within normal limits.  Her white count is 7.5 with an H and H are 11.7 and  38.5, with 351,000 platelets and normal differential.  PLAN:  As discussed, we will plan to transfer her from [facility] to [facility] where this surgery that has been contemplated in the future can be re-evaluated as far as to its necessity or decisions can be made there by those who have taken care of her for quite some time.  Plus the fact, she would have the benefit of a cardiologist judging from her past history.     Felicie Morn, M.D.     WB/MEDQ  D:  01/16/2014  T:  01/17/2014  Job:  [id number]  cc:   Chipper Herb, M.D. Fax: [phone]  [facility]

## 2014-01-17 NOTE — Progress Notes (Signed)
Pt arrive to unit in no s/s of distress. Pt A&Ox2 - pt reoriented to time and situation. Whiteboard updated. [place] within reach. [facility] transport pt from [place] to [employer] [facility]. Report received from sending nurse after pt arrive to 5W. VS stable. Tele applied. Will continue to monitor.

## 2014-01-17 NOTE — Progress Notes (Signed)
[facility] Progress Note     Subjective: This patient is well known to [place].  She is very pleasant and appreciative to all of our care.  See previous notes for history.  She was transferred from [facility] yesterday.  Pt says pain, N/V has resolved.  She is tolerating clear liquids well and has her NG clamped off.  She is ambulating well.  She is have flatus and had a normal BM yesterday.  She denies any further GI bleeding.  Objective: Vital signs in last 24 hours: Temp:  [97.4 F (36.3 C)-99.5 F (37.5 C)] 98.1 F (36.7 C) (11/04 0534) Pulse Rate:  [59-61] 59 (11/03 2214) Resp:  [20-24] 20 (11/04 0534) BP: (106-145)/(56-60) 106/56 mmHg (11/04 0534) SpO2:  [94 %-96 %] 94 % (11/04 0534) Weight:  [183 lb 3.2 oz (83.099 kg)] 183 lb 3.2 oz (83.099 kg) (11/03 2214) Last BM Date: 01/16/14  Intake/Output from previous day: 11/03 0701 - 11/04 0700 In: 677.5 [P.O.:150; I.V.:527.5] Out: -  Intake/Output this shift:    PE: Gen:  Alert, NAD, pleasant Abd: Soft, NT/ND, +BS, no HSM   Lab Results:   Recent Labs  01/15/14 1307  WBC 7.5  HGB 11.7*  HCT 38.5  PLT 351   BMET  Recent Labs  01/15/14 1307  NA 140  K 4.6  CL 101  CO2 29  GLUCOSE 106*  BUN 21  CREATININE 1.11*  CALCIUM 9.5   PT/INR No results for input(s): LABPROT, INR in the last 72 hours. CMP     Component Value Date/Time   NA 140 01/15/2014 1307   NA 142 01/02/2014 1635   K 4.6 01/15/2014 1307   CL 101 01/15/2014 1307   CO2 29 01/15/2014 1307   GLUCOSE 106* 01/15/2014 1307   GLUCOSE 121* 01/02/2014 1635   BUN 21 01/15/2014 1307   BUN 14 01/02/2014 1635   CREATININE 1.11* 01/15/2014 1307   CALCIUM 9.5 01/15/2014 1307   PROT 7.6 01/15/2014 1307   PROT 6.3 12/25/2013 1035   ALBUMIN 3.2* 01/15/2014 1307   AST 19 01/15/2014 1307   ALT 9 01/15/2014 1307   ALKPHOS 110 01/15/2014 1307   BILITOT 0.4 01/15/2014 1307   GFRNONAA 43* 01/15/2014 1307   GFRAA 50* 01/15/2014 1307   Lipase      Component Value Date/Time   LIPASE 28 01/15/2014 1307       Studies/Results: Dg Chest 1 View  01/16/2014   CLINICAL DATA:  Weakness, status post nasogastric catheter placement  EXAM: CHEST - 1 VIEW  COMPARISON:  01/15/2014  FINDINGS: Cardiac shadow is stable. A pacing device is again seen. Bilateral lung masses are again noted and stable. A nasogastric catheter has been placed with its tip within the stomach beyond the edge of the film. No bony abnormality is noted.  IMPRESSION: Nasogastric catheter within the stomach.   Electronically Signed   By: Inez Catalina M.D.   On: 01/16/2014 08:45   Dg Chest 2 View  01/15/2014   CLINICAL DATA:  Shortness of Breath.  Colon carcinoma  EXAM: CHEST  2 VIEW  COMPARISON:  December 13, 2013  FINDINGS: There is a mass in the superior segment of the right lower lobe measuring 5.0 x 4.4 x 4.3 cm. There is a mass in the superior segment of the left lower lobe measuring 3.0 x 2.4 by 2.2 cm. There is no edema or consolidation. Heart is mildly enlarged with pulmonary vascularity within normal limits. Pacemaker leads are attached to  the right atrium and right ventricle. No adenopathy. No bone lesions.  IMPRESSION: Masses in the superior segment of each lower lobe. These masses appear larger than on recent prior study. Metastatic disease is felt to be present.  No edema or consolidation.  Stable cardiac enlargement.   Electronically Signed   By: Lowella Grip M.D.   On: 01/15/2014 16:20   Dg Abd 1 View  01/15/2014   CLINICAL DATA:  Nausea vomiting today. Partial small bowel obstruction on an abdomen and pelvis CT earlier today. Nasogastric tube placement.  EXAM: ABDOMEN - 1 VIEW  COMPARISON:  Abdomen and pelvis CT obtained earlier today.  FINDINGS: Nasogastric tube tip in the region of the gastric pylorus or duodenal bulb. CT oral contrast in normal caliber and dilated small bowel loops. Left pelvic and lower abdominal surgical clips. Lumbar spine degenerative changes  and mild scoliosis.  IMPRESSION: Stable pattern of partial small bowel obstruction. Nasogastric tube tip in the gastric pylorus or duodenal bulb.   Electronically Signed   By: Enrique Sack M.D.   On: 01/15/2014 21:15   Ct Abdomen Pelvis W Contrast  01/15/2014   CLINICAL DATA:  Shortness of breath. Abdominal pain. History of recurrent colon cancer on chemotherapy.  EXAM: CT ABDOMEN AND PELVIS WITH CONTRAST  TECHNIQUE: Multidetector CT imaging of the abdomen and pelvis was performed using the standard protocol following bolus administration of intravenous contrast.  CONTRAST:  46mL OMNIPAQUE IOHEXOL 300 MG/ML SOLN, 87mL OMNIPAQUE IOHEXOL 300 MG/ML SOLN  COMPARISON:  CT 11/30/2013  FINDINGS: Previously seen bilateral lower lobe masses are not imaged on the CT abdomen. Heart is borderline in size. Dense coronary artery calcifications. Pacer wires noted in the right heart.  Tiny hypodensity seen in the inferior right hepatic lobe again noted and stable. Spleen, pancreas, adrenals have an unremarkable appearance. Stable small cyst in the midpole of the right kidney. No hydronephrosis.  Changes of right hemicolectomy. Surgical changes also noted in the sigmoid colon. There are dilated small bowel loops in the abdomen. Distal small bowel loops are decompressed. Findings compatible with small bowel obstruction. Transition is in the right lower quadrant, best seen on image 51 of series 2, where the out flowing small bowel loop appears thick-walled. This is of unknown etiology. This could reflect an area of wall thickening due to inflammation/infection. Tumor cannot be excluded. Stomach is unremarkable.  Aorta and iliac vessels are calcified, non aneurysmal. No free fluid or free air. Prior hysterectomy. No adnexal masses. Urinary bladder is unremarkable.  Decreasing presacral edema and soft tissue thickening, likely improving postsurgical changes. Soft tissue mass in the anterior abdominal wall in the region of the right  rectus muscle is again noted measuring 2.9 x 2.1 cm compared with 2.5 x 2.3 cm previously.  Degenerative changes in the lumbar spine. No acute bony abnormality or focal bone lesion.  IMPRESSION: Dilated proximal small bowel loops with transition point in the right lower quadrant an area of circumferential wall thickening within the small bowel. Findings compatible with partial small bowel obstruction. Wall thickening could be related to inflammation/infection, but malignancy cannot be excluded.  Postsurgical changes in the right colon and sigmoid colon.  Anterior abdominal wall nodule again noted, possibly slightly larger.   Electronically Signed   By: Rolm Baptise M.D.   On: 01/15/2014 16:51    Anti-infectives: Anti-infectives    None       Assessment/Plan 1. 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 ABL anemia  secondary to #1 and chronic anticoagulation on Eliquis  3. Previous CVA  4. History of sigmoid and cecal adenocarcinoma, s/p resections  5. Diastolic HF  6. HTN  7. A. Fib  8. H/o ovarian cancer  9. Tachy-brady syndrome, s/p pacemaker 10. Intermittent nausea/vomiting 2* #1  Plan: 1. This is a difficult situation.  Surgery is not curative and she may not do well through a big surgery.  She needs palliative care consult.  I have asked palliative's Elmo Putt to come back and be officially consulted to discuss [place] now that she is here at [facility] and not at [facility].   2. She is off her eliquis and is having no further GI bleeding.  She is having N/V. 3.  Her NG is clamped, NG output is not recorded, and no canister is in the room.  Since her symptoms have resolved she may just be resumed on full liquids, and return home soon.  4.  Dr. Grandville Silos with [facility] will be willing to perform a palliative PEG on Friday if the patient/family consent.  She says she would consent to that procedure if there is a chance it could keep her out of the hospital when she  gets to being nauseated and throwing up.  It would help her to have less hospital admissions potentially.  Unsure if her family would agree to this procedure.  Dr. Brantley Stage to eval later. 5.  Unsure why her NG tube is in place but has not been up to suction, will let Dr. Brantley Stage decide if he wants it removed.     LOS: 2 days    Coralie Keens 01/17/2014, 8:56 AM Pager: [phone]

## 2014-01-17 NOTE — Progress Notes (Signed)
[facility]                                                                              Patient Demographics  [name], is a 80 y.o. female, DOB - [birth year], OBS:[id number]  Admit date - 01/15/2014   Admitting Physician Samuella Cota, MD  Outpatient Primary MD for the patient is [name], MD  LOS - 2   Chief Complaint  Patient presents with  . Emesis      HPI on 01/15/2014  [name] is a 80 y.o. female with a history of heart failure, sick sinus syndrome, GERD, cancer who presented to [facility] with intractable n/v over past 1-2 weeks. Given specific diet and zofran during that time w/o benefit. Becoming more frequent. Daughter spoke to staff by phone who felt pt needed to come to ED. N/v worse w/ any food and even water. Unable keep anything down for several days. 2-3bouts of emesis per day. Emesis was non-bloody and bilious. Denies diarrhea, fevers. Intermittent URI symptoms.   Assessment & Plan   Partial small bowel obstruction -Patient was sent from [facility] hospital to [facility] -This is been ongoing for several months, likely related to colon cancer status post resection -Patient does have NG tube in place however not set to suction -Continue antiemetics, IVF, pain control -Surgery consulted and appreciated -Patient may have PEG tube placed, as per surgery  Probable metastatic cancer -patient has a history of colon, ovarian, and lung cancer -continue outpatient workup per oncology  Hypertension -Continue hydralazine IV as needed  Atrial fibrillation/Tachy-brady syndrome -Pacemaker in place -Continue diltiazem and sotalol  GERD -Continue PPI  Code Status: DO NOT RESUSCITATE  Family Communication: none at bedside  Disposition Plan: admitted  Time Spent in minutes   30 minutes  Procedures  none  Consults   General surgery Palliative care  DVT Prophylaxis  heparin  Lab Results  Component Value Date   PLT 351  01/15/2014    Medications  Scheduled Meds: . aspirin EC  81 mg Oral Daily  . diltiazem  180 mg Oral Daily  . heparin  5,000 Units Subcutaneous 3 times per day  . pantoprazole (PROTONIX) IV  40 mg Intravenous Q24H  . sodium chloride  3 mL Intravenous Q12H  . sotalol  240 mg Oral BID   Continuous Infusions: . sodium chloride 75 mL/hr at 01/16/14 2313   PRN Meds:.acetaminophen **OR** acetaminophen, hydrALAZINE, LORazepam, metoprolol, morphine injection, promethazine  Antibiotics    Anti-infectives    None      Subjective:   [name] seen and examined today.   Patient no longer complains of nausea or vomiting. She has been tolerating clear liquids well. Her NG tube is currently clamped off. Patient states she had a bowel movement yesterday. She denies any chest pain, shortness of breath, dizziness, headache.  Objective:   Filed Vitals:   01/16/14 0530 01/16/14 1601 01/16/14 2214 01/17/14 0534  BP: 162/81 120/56 145/60 106/56  Pulse: 97 61 59   Temp: 98 F (36.7 C) 99.5 F (37.5 C) 97.4 F (36.3 C) 98.1 F (36.7 C)  TempSrc: Oral Oral Oral Oral  Resp: 20  20 24 20   Height:   5' 9.5" (1.765 m)   Weight:   83.099 kg (183 lb 3.2 oz)   SpO2: 96% 96% 95% 94%    Wt Readings from Last 3 Encounters:  01/16/14 83.099 kg (183 lb 3.2 oz)  12/25/13 86.637 kg (191 lb)  12/13/13 83.915 kg (185 lb)     Intake/Output Summary (Last 24 hours) at 01/17/14 1202 Last data filed at 01/17/14 0800  Gross per 24 hour  Intake  677.5 ml  Output    400 ml  Net  277.5 ml    Exam  General: Well developed, well nourished, NAD, appears stated age  55: NCAT, mucous membranes moist. NG tube in place  Cardiovascular: S1 S2 auscultated, RRR  Respiratory: Clear to auscultation bilaterally with equal chest rise  Abdomen: Soft, nontender, nondistended, + bowel sounds  Extremities: warm dry without cyanosis clubbing or edema  Neuro: AAOx2, cranial nerves grossly intact. Strength  5/5 in patient's upper and lower extremities bilaterally  Psych: appropriate affect and demeanor  Data Review   Micro Results No results found for this or any previous visit (from the past 240 hour(s)).  Radiology Reports Dg Chest 1 View  01/16/2014   CLINICAL DATA:  Weakness, status post nasogastric catheter placement  EXAM: CHEST - 1 VIEW  COMPARISON:  01/15/2014  FINDINGS: Cardiac shadow is stable. A pacing device is again seen. Bilateral lung masses are again noted and stable. A nasogastric catheter has been placed with its tip within the stomach beyond the edge of the film. No bony abnormality is noted.  IMPRESSION: Nasogastric catheter within the stomach.   Electronically Signed   By: Inez Catalina M.D.   On: 01/16/2014 08:45   Dg Chest 2 View  01/15/2014   CLINICAL DATA:  Shortness of Breath.  Colon carcinoma  EXAM: CHEST  2 VIEW  COMPARISON:  December 13, 2013  FINDINGS: There is a mass in the superior segment of the right lower lobe measuring 5.0 x 4.4 x 4.3 cm. There is a mass in the superior segment of the left lower lobe measuring 3.0 x 2.4 by 2.2 cm. There is no edema or consolidation. Heart is mildly enlarged with pulmonary vascularity within normal limits. Pacemaker leads are attached to the right atrium and right ventricle. No adenopathy. No bone lesions.  IMPRESSION: Masses in the superior segment of each lower lobe. These masses appear larger than on recent prior study. Metastatic disease is felt to be present.  No edema or consolidation.  Stable cardiac enlargement.   Electronically Signed   By: Lowella Grip M.D.   On: 01/15/2014 16:20   Dg Abd 1 View  01/15/2014   CLINICAL DATA:  Nausea vomiting today. Partial small bowel obstruction on an abdomen and pelvis CT earlier today. Nasogastric tube placement.  EXAM: ABDOMEN - 1 VIEW  COMPARISON:  Abdomen and pelvis CT obtained earlier today.  FINDINGS: Nasogastric tube tip in the region of the gastric pylorus or duodenal bulb. CT  oral contrast in normal caliber and dilated small bowel loops. Left pelvic and lower abdominal surgical clips. Lumbar spine degenerative changes and mild scoliosis.  IMPRESSION: Stable pattern of partial small bowel obstruction. Nasogastric tube tip in the gastric pylorus or duodenal bulb.   Electronically Signed   By: Enrique Sack M.D.   On: 01/15/2014 21:15   Ct Abdomen Pelvis W Contrast  01/15/2014   CLINICAL DATA:  Shortness of breath. Abdominal pain. History of recurrent colon cancer on  chemotherapy.  EXAM: CT ABDOMEN AND PELVIS WITH CONTRAST  TECHNIQUE: Multidetector CT imaging of the abdomen and pelvis was performed using the standard protocol following bolus administration of intravenous contrast.  CONTRAST:  73mL OMNIPAQUE IOHEXOL 300 MG/ML SOLN, 44mL OMNIPAQUE IOHEXOL 300 MG/ML SOLN  COMPARISON:  CT 11/30/2013  FINDINGS: Previously seen bilateral lower lobe masses are not imaged on the CT abdomen. Heart is borderline in size. Dense coronary artery calcifications. Pacer wires noted in the right heart.  Tiny hypodensity seen in the inferior right hepatic lobe again noted and stable. Spleen, pancreas, adrenals have an unremarkable appearance. Stable small cyst in the midpole of the right kidney. No hydronephrosis.  Changes of right hemicolectomy. Surgical changes also noted in the sigmoid colon. There are dilated small bowel loops in the abdomen. Distal small bowel loops are decompressed. Findings compatible with small bowel obstruction. Transition is in the right lower quadrant, best seen on image 51 of series 2, where the out flowing small bowel loop appears thick-walled. This is of unknown etiology. This could reflect an area of wall thickening due to inflammation/infection. Tumor cannot be excluded. Stomach is unremarkable.  Aorta and iliac vessels are calcified, non aneurysmal. No free fluid or free air. Prior hysterectomy. No adnexal masses. Urinary bladder is unremarkable.  Decreasing presacral edema  and soft tissue thickening, likely improving postsurgical changes. Soft tissue mass in the anterior abdominal wall in the region of the right rectus muscle is again noted measuring 2.9 x 2.1 cm compared with 2.5 x 2.3 cm previously.  Degenerative changes in the lumbar spine. No acute bony abnormality or focal bone lesion.  IMPRESSION: Dilated proximal small bowel loops with transition point in the right lower quadrant an area of circumferential wall thickening within the small bowel. Findings compatible with partial small bowel obstruction. Wall thickening could be related to inflammation/infection, but malignancy cannot be excluded.  Postsurgical changes in the right colon and sigmoid colon.  Anterior abdominal wall nodule again noted, possibly slightly larger.   Electronically Signed   By: Rolm Baptise M.D.   On: 01/15/2014 16:51    CBC  Recent Labs Lab 01/15/14 1307  WBC 7.5  HGB 11.7*  HCT 38.5  PLT 351  MCV 84.2  MCH 25.6*  MCHC 30.4  RDW 19.8*  LYMPHSABS 1.2  MONOABS 0.9  EOSABS 0.1  BASOSABS 0.0    Chemistries   Recent Labs Lab 01/15/14 1307  NA 140  K 4.6  CL 101  CO2 29  GLUCOSE 106*  BUN 21  CREATININE 1.11*  CALCIUM 9.5  AST 19  ALT 9  ALKPHOS 110  BILITOT 0.4   ------------------------------------------------------------------------------------------------------------------ estimated creatinine clearance is 40 mL/min (by C-G formula based on Cr of 1.11). ------------------------------------------------------------------------------------------------------------------ No results for input(s): HGBA1C in the last 72 hours. ------------------------------------------------------------------------------------------------------------------ No results for input(s): CHOL, HDL, LDLCALC, TRIG, CHOLHDL, LDLDIRECT in the last 72 hours. ------------------------------------------------------------------------------------------------------------------ No results for  input(s): TSH, T4TOTAL, T3FREE, THYROIDAB in the last 72 hours.  Invalid input(s): FREET3 ------------------------------------------------------------------------------------------------------------------ No results for input(s): VITAMINB12, FOLATE, FERRITIN, TIBC, IRON, RETICCTPCT in the last 72 hours.  Coagulation profile No results for input(s): INR, PROTIME in the last 168 hours.  No results for input(s): DDIMER in the last 72 hours.  Cardiac Enzymes No results for input(s): CKMB, TROPONINI, MYOGLOBIN in the last 168 hours.  Invalid input(s): CK ------------------------------------------------------------------------------------------------------------------ Invalid input(s): POCBNP    Tanji Storrs D.O. on 01/17/2014 at 12:02 PM  Between 7am to 7pm - Pager - [phone]  After 7pm go to [email or web address] - password [email or web address]  And look for the night coverage person covering for me after hours  [facility] Office  [phone]

## 2014-01-17 NOTE — Evaluation (Signed)
Occupational Therapy Evaluation Patient Details Name: [name] MRN: [id number] DOB: 1922/07/08 Today's Date: 01/17/2014    History of Present Illness Pt is an 80 year old female who was seen at [facility] for intractable n/v over past 1-2 weeks. Given specific diet and zofran during that time w/o benefit. Becoming more frequent. Daughter spoke to staff today by phone who felt pt needed to come to ED. N/v worse w/ any food and even water. Unable keep anything down for several days. 2-3bouts of emesis per day. Emesis is non-bloody and bilious. Denies diarrhea, fevers.     Clinical Impression   Pt is performing and mobility at her baseline. Has assist of her family and caregivers at home. No further OT needs.    Follow Up Recommendations  No OT follow up    Equipment Recommendations  None recommended by OT    Recommendations for Other Services       Precautions / Restrictions Precautions Precautions: Fall Precaution Comments: NG tube Restrictions Weight Bearing Restrictions: No      Mobility Bed Mobility Overal bed mobility: Modified Independent                Transfers Overall transfer level: Modified independent Equipment used: Quad cane                  Balance             Standing balance-Leahy Scale: Fair                              ADL Overall ADL's : At baseline                                       General ADL Comments: Assisted pt maximally with bathing and dressing.  Pt combed her own hair and applied lotion to her face and hands.     Vision                     Perception     Praxis      Pertinent Vitals/Pain Pain Assessment: No/denies pain     Hand Dominance Right   Extremity/Trunk Assessment Upper Extremity Assessment Upper Extremity Assessment: Overall WFL for tasks assessed   Lower Extremity Assessment Lower Extremity Assessment: Defer to PT evaluation       Communication  Communication Communication: No difficulties   Cognition Arousal/Alertness: Awake/alert Behavior During Therapy: WFL for tasks assessed/performed Overall Cognitive Status: Within Functional Limits for tasks assessed                     General Comments       Exercises       Shoulder Instructions      Home Living Family/patient expects to be discharged to:: Private residence Living Arrangements: Alone Available Help at Discharge: Family;Personal care attendant;Available 24 hours/day Type of Home: House Home Access: Ramped entrance     Home Layout: One level     Bathroom Shower/Tub: Teacher, early years/pre: Standard     Home Equipment: Grab bars - toilet;Grab bars - tub/shower;Cane - quad;Walker - 4 wheels;Wheelchair - manual          Prior Functioning/Environment Level of Independence: Needs assistance  Gait / Transfers Assistance Needed: Pt reports she is mod (I) with bed mobility skills, transfers,  and household amb.  Pt reports she primarily uses her quad cane, though does have a rollator to use sometimes.  ADL's / Homemaking Assistance Needed: Assist from caretaker for cooking, cleaning, bathing, dressing.    Comments: pt reports she willhave 24/7 (A) upon D/C     OT Diagnosis:     OT Problem List:     OT Treatment/Interventions:      OT Goals(Current goals can be found in the care plan section)    OT Frequency:     Barriers to D/C:            Co-evaluation              End of Session Equipment Utilized During Treatment:  (quad cane)  Activity Tolerance: Patient tolerated treatment well Patient left: in bed;with call bell/phone within reach;with bed alarm set   Time: 4888-9169 OT Time Calculation (min): 31 min Charges:  OT General Charges $OT Visit: 1 Procedure OT Evaluation $Initial OT Evaluation Tier I: 1 Procedure OT Treatments $Self Care/Home Management : 8-22 mins G-Codes:    Malka So 01/17/2014,  11:01 AM  [phone]

## 2014-01-18 DIAGNOSIS — R112 Nausea with vomiting, unspecified: Secondary | ICD-10-CM

## 2014-01-18 DIAGNOSIS — Z515 Encounter for palliative care: Secondary | ICD-10-CM

## 2014-01-18 LAB — BASIC METABOLIC PANEL
ANION GAP: 11 (ref 5–15)
BUN: 11 mg/dL (ref 6–23)
CHLORIDE: 106 meq/L (ref 96–112)
CO2: 24 mEq/L (ref 19–32)
CREATININE: 0.93 mg/dL (ref 0.50–1.10)
Calcium: 8.2 mg/dL — ABNORMAL LOW (ref 8.4–10.5)
GFR calc non Af Amer: 53 mL/min — ABNORMAL LOW (ref >90)
GFR, EST AFRICAN AMERICAN: 61 mL/min — AB (ref >90)
Glucose, Bld: 77 mg/dL (ref 70–99)
POTASSIUM: 4.2 meq/L (ref 3.7–5.3)
SODIUM: 141 meq/L (ref 137–147)

## 2014-01-18 LAB — CBC
HCT: 32.9 % — ABNORMAL LOW (ref 36.0–46.0)
Hemoglobin: 9.9 g/dL — ABNORMAL LOW (ref 12.0–15.0)
MCH: 25.3 pg — ABNORMAL LOW (ref 26.0–34.0)
MCHC: 30.1 g/dL (ref 30.0–36.0)
MCV: 83.9 fL (ref 78.0–100.0)
PLATELETS: 252 10*3/uL (ref 150–400)
RBC: 3.92 MIL/uL (ref 3.87–5.11)
RDW: 19.8 % — AB (ref 11.5–15.5)
WBC: 4.9 10*3/uL (ref 4.0–10.5)

## 2014-01-18 NOTE — Progress Notes (Addendum)
[facility]                                                                              Patient Demographics  [name], is a 80 y.o. female, DOB - February 12, 1923, VPX:[id number]  Admit date - 01/15/2014   Admitting Physician Samuella Cota, MD  Outpatient Primary MD for the patient is [name], MD  LOS - 3   Chief Complaint  Patient presents with  . Emesis      HPI on 01/15/2014  [name] is a 80 y.o. female with a history of heart failure, sick sinus syndrome, GERD, cancer who presented to [facility] with intractable n/v over past 1-2 weeks. Given specific diet and zofran during that time w/o benefit. Becoming more frequent. Daughter spoke to staff by phone who felt pt needed to come to ED. N/v worse w/ any food and even water. Unable keep anything down for several days. 2-3bouts of emesis per day. Emesis was non-bloody and bilious. Denies diarrhea, fevers. Intermittent URI symptoms.   Assessment & Plan   Partial small bowel obstruction -Patient was sent from [facility] hospital to [facility] -This is been ongoing for several months, likely related to colon cancer status post resection -Patient had NG tube, however, removed overnight (no reports of vomiting) -Continue antiemetics, pain control -Surgery consulted and appreciated -Patient may have PEG tube placed on 01/19/2014, as per surgery -Patient tolerating clear liquid diet well  Probable metastatic cancer -patient has a history of colon, ovarian, and lung cancer -continue outpatient workup per oncology  Hypertension -Continue hydralazine IV as needed  Atrial fibrillation/Tachy-brady syndrome -Pacemaker in place -Continue diltiazem and sotalol  GERD -Continue PPI  Chronic normocytic anemia -hemoglobin appears to be at baseline approximately 9 -Continue to monitor CBC -Home iron held  Code Status: DO NOT RESUSCITATE  Family Communication: none at bedside  Disposition Plan: admitted.   Pending possible Peg tube placement on 01/19/2014.  Time Spent in minutes   25 minutes  Procedures  none  Consults   General surgery Palliative care  DVT Prophylaxis  heparin  Lab Results  Component Value Date   PLT 252 01/18/2014    Medications  Scheduled Meds: . aspirin EC  81 mg Oral Daily  . diltiazem  180 mg Oral Daily  . heparin  5,000 Units Subcutaneous 3 times per day  . pantoprazole (PROTONIX) IV  40 mg Intravenous Q24H  . sodium chloride  3 mL Intravenous Q12H  . sotalol  240 mg Oral BID   Continuous Infusions: . sodium chloride 75 mL/hr at 01/18/14 0021   PRN Meds:.acetaminophen **OR** acetaminophen, hydrALAZINE, LORazepam, metoprolol, morphine injection, promethazine  Antibiotics    Anti-infectives    None      Subjective:   [name] seen and examined today.   Patient no longer complains of abdominal pain, nausea, vomiting. States she's been able to drink well. She has no complaints of chest pain, shortness of breath at this time.  Objective:   Filed Vitals:   01/17/14 1405 01/17/14 2322 01/18/14 0550 01/18/14 1022  BP: 144/60 155/84 133/63 135/58  Pulse: 61 66  63  Temp: 98.7 F (37.1  C) 98.4 F (36.9 C) 98 F (36.7 C) 98.2 F (36.8 C)  TempSrc: Oral Oral Oral Oral  Resp: 20 16 16 22   Height:      Weight:      SpO2: 96% 100%  96%    Wt Readings from Last 3 Encounters:  01/16/14 83.099 kg (183 lb 3.2 oz)  12/25/13 86.637 kg (191 lb)  12/13/13 83.915 kg (185 lb)     Intake/Output Summary (Last 24 hours) at 01/18/14 1147 Last data filed at 01/18/14 1043  Gross per 24 hour  Intake 2627.5 ml  Output   1475 ml  Net 1152.5 ml    Exam  General: Well developed, well nourished, NAD, appears stated age  35: NCAT, mucous membranes moist.   Cardiovascular: S1 S2 auscultated, RRR  Respiratory: Clear to auscultation bilaterally with equal chest rise  Abdomen: Soft, nontender, nondistended, + bowel sounds  Extremities: warm dry  without cyanosis clubbing or edema  Neuro: AAOx3, no focal deficit  Psych: appropriate affect and demeanor  Data Review   Micro Results No results found for this or any previous visit (from the past 240 hour(s)).  Radiology Reports Dg Chest 1 View  01/16/2014   CLINICAL DATA:  Weakness, status post nasogastric catheter placement  EXAM: CHEST - 1 VIEW  COMPARISON:  01/15/2014  FINDINGS: Cardiac shadow is stable. A pacing device is again seen. Bilateral lung masses are again noted and stable. A nasogastric catheter has been placed with its tip within the stomach beyond the edge of the film. No bony abnormality is noted.  IMPRESSION: Nasogastric catheter within the stomach.   Electronically Signed   By: Inez Catalina M.D.   On: 01/16/2014 08:45   Dg Chest 2 View  01/15/2014   CLINICAL DATA:  Shortness of Breath.  Colon carcinoma  EXAM: CHEST  2 VIEW  COMPARISON:  December 13, 2013  FINDINGS: There is a mass in the superior segment of the right lower lobe measuring 5.0 x 4.4 x 4.3 cm. There is a mass in the superior segment of the left lower lobe measuring 3.0 x 2.4 by 2.2 cm. There is no edema or consolidation. Heart is mildly enlarged with pulmonary vascularity within normal limits. Pacemaker leads are attached to the right atrium and right ventricle. No adenopathy. No bone lesions.  IMPRESSION: Masses in the superior segment of each lower lobe. These masses appear larger than on recent prior study. Metastatic disease is felt to be present.  No edema or consolidation.  Stable cardiac enlargement.   Electronically Signed   By: Lowella Grip M.D.   On: 01/15/2014 16:20   Dg Abd 1 View  01/15/2014   CLINICAL DATA:  Nausea vomiting today. Partial small bowel obstruction on an abdomen and pelvis CT earlier today. Nasogastric tube placement.  EXAM: ABDOMEN - 1 VIEW  COMPARISON:  Abdomen and pelvis CT obtained earlier today.  FINDINGS: Nasogastric tube tip in the region of the gastric pylorus or  duodenal bulb. CT oral contrast in normal caliber and dilated small bowel loops. Left pelvic and lower abdominal surgical clips. Lumbar spine degenerative changes and mild scoliosis.  IMPRESSION: Stable pattern of partial small bowel obstruction. Nasogastric tube tip in the gastric pylorus or duodenal bulb.   Electronically Signed   By: Enrique Sack M.D.   On: 01/15/2014 21:15   Ct Abdomen Pelvis W Contrast  01/15/2014   CLINICAL DATA:  Shortness of breath. Abdominal pain. History of recurrent colon cancer on chemotherapy.  EXAM: CT ABDOMEN AND PELVIS WITH CONTRAST  TECHNIQUE: Multidetector CT imaging of the abdomen and pelvis was performed using the standard protocol following bolus administration of intravenous contrast.  CONTRAST:  57mL OMNIPAQUE IOHEXOL 300 MG/ML SOLN, 20mL OMNIPAQUE IOHEXOL 300 MG/ML SOLN  COMPARISON:  CT 11/30/2013  FINDINGS: Previously seen bilateral lower lobe masses are not imaged on the CT abdomen. Heart is borderline in size. Dense coronary artery calcifications. Pacer wires noted in the right heart.  Tiny hypodensity seen in the inferior right hepatic lobe again noted and stable. Spleen, pancreas, adrenals have an unremarkable appearance. Stable small cyst in the midpole of the right kidney. No hydronephrosis.  Changes of right hemicolectomy. Surgical changes also noted in the sigmoid colon. There are dilated small bowel loops in the abdomen. Distal small bowel loops are decompressed. Findings compatible with small bowel obstruction. Transition is in the right lower quadrant, best seen on image 51 of series 2, where the out flowing small bowel loop appears thick-walled. This is of unknown etiology. This could reflect an area of wall thickening due to inflammation/infection. Tumor cannot be excluded. Stomach is unremarkable.  Aorta and iliac vessels are calcified, non aneurysmal. No free fluid or free air. Prior hysterectomy. No adnexal masses. Urinary bladder is unremarkable.   Decreasing presacral edema and soft tissue thickening, likely improving postsurgical changes. Soft tissue mass in the anterior abdominal wall in the region of the right rectus muscle is again noted measuring 2.9 x 2.1 cm compared with 2.5 x 2.3 cm previously.  Degenerative changes in the lumbar spine. No acute bony abnormality or focal bone lesion.  IMPRESSION: Dilated proximal small bowel loops with transition point in the right lower quadrant an area of circumferential wall thickening within the small bowel. Findings compatible with partial small bowel obstruction. Wall thickening could be related to inflammation/infection, but malignancy cannot be excluded.  Postsurgical changes in the right colon and sigmoid colon.  Anterior abdominal wall nodule again noted, possibly slightly larger.   Electronically Signed   By: Rolm Baptise M.D.   On: 01/15/2014 16:51    CBC  Recent Labs Lab 01/15/14 1307 01/18/14 0549  WBC 7.5 4.9  HGB 11.7* 9.9*  HCT 38.5 32.9*  PLT 351 252  MCV 84.2 83.9  MCH 25.6* 25.3*  MCHC 30.4 30.1  RDW 19.8* 19.8*  LYMPHSABS 1.2  --   MONOABS 0.9  --   EOSABS 0.1  --   BASOSABS 0.0  --     Chemistries   Recent Labs Lab 01/15/14 1307 01/18/14 0549  NA 140 141  K 4.6 4.2  CL 101 106  CO2 29 24  GLUCOSE 106* 77  BUN 21 11  CREATININE 1.11* 0.93  CALCIUM 9.5 8.2*  AST 19  --   ALT 9  --   ALKPHOS 110  --   BILITOT 0.4  --    ------------------------------------------------------------------------------------------------------------------ estimated creatinine clearance is 47.7 mL/min (by C-G formula based on Cr of 0.93). ------------------------------------------------------------------------------------------------------------------ No results for input(s): HGBA1C in the last 72 hours. ------------------------------------------------------------------------------------------------------------------ No results for input(s): CHOL, HDL, LDLCALC, TRIG, CHOLHDL,  LDLDIRECT in the last 72 hours. ------------------------------------------------------------------------------------------------------------------ No results for input(s): TSH, T4TOTAL, T3FREE, THYROIDAB in the last 72 hours.  Invalid input(s): FREET3 ------------------------------------------------------------------------------------------------------------------ No results for input(s): VITAMINB12, FOLATE, FERRITIN, TIBC, IRON, RETICCTPCT in the last 72 hours.  Coagulation profile No results for input(s): INR, PROTIME in the last 168 hours.  No results for input(s): DDIMER in the last 72 hours.  Cardiac  Enzymes No results for input(s): CKMB, TROPONINI, MYOGLOBIN in the last 168 hours.  Invalid input(s): CK ------------------------------------------------------------------------------------------------------------------ Invalid input(s): POCBNP    Cristen Murcia D.O. on 01/18/2014 at 11:47 AM  Between 7am to 7pm - Pager - [phone]  After 7pm go to [email or web address] - password [email or web address]  And look for the night coverage person covering for me after hours  [facility] Office  [phone]

## 2014-01-18 NOTE — Progress Notes (Signed)
Pt's NG tube was accidentally pulled out while patient was getting to [facility]. On call NP Baltazar Najjar paged - state to leave NG tube out until morning in order to reevaluate with hospitalist and surgeon.

## 2014-01-18 NOTE — Progress Notes (Signed)
  Subjective: NGT removed overnight. Patient endorses no nausea or vomiting since NGT removed. Reports normal BM over night. No issues with pain. Tolerating clears. States that she is quite hopeful that PEG tube placement will make it comfortable for her to be at home or SNF and allow her to eat a regular diet.   Objective: Vital signs in last 24 hours: Temp:  [98 F (36.7 C)-98.7 F (37.1 C)] 98 F (36.7 C) (11/05 0550) Pulse Rate:  [59-66] 66 (11/04 2322) Resp:  [16-20] 16 (11/05 0550) BP: (133-155)/(60-84) 133/63 mmHg (11/05 0550) SpO2:  [96 %-100 %] 100 % (11/04 2322) Last BM Date: 01/16/14  Intake/Output from previous day: 11/04 0701 - 11/05 0700 In: 2387.5 [P.O.:600; I.V.:1787.5] Out: 1700 [Urine:1700] Intake/Output this shift:    PE: Gen: alert, oriented, pleasant Card: RRR Pulm: CTA B/L Abd: soft, NTND, NABS  Lab Results:   Recent Labs  01/15/14 1307 01/18/14 0549  WBC 7.5 4.9  HGB 11.7* 9.9*  HCT 38.5 32.9*  PLT 351 252   BMET  Recent Labs  01/15/14 1307 01/18/14 0549  NA 140 141  K 4.6 4.2  CL 101 106  CO2 29 24  GLUCOSE 106* 77  BUN 21 11  CREATININE 1.11* 0.93  CALCIUM 9.5 8.2*   PT/INR No results for input(s): LABPROT, INR in the last 72 hours. CMP     Component Value Date/Time   NA 141 01/18/2014 0549   NA 142 01/02/2014 1635   K 4.2 01/18/2014 0549   CL 106 01/18/2014 0549   CO2 24 01/18/2014 0549   GLUCOSE 77 01/18/2014 0549   GLUCOSE 121* 01/02/2014 1635   BUN 11 01/18/2014 0549   BUN 14 01/02/2014 1635   CREATININE 0.93 01/18/2014 0549   CALCIUM 8.2* 01/18/2014 0549   PROT 7.6 01/15/2014 1307   PROT 6.3 12/25/2013 1035   ALBUMIN 3.2* 01/15/2014 1307   AST 19 01/15/2014 1307   ALT 9 01/15/2014 1307   ALKPHOS 110 01/15/2014 1307   BILITOT 0.4 01/15/2014 1307   GFRNONAA 53* 01/18/2014 0549   GFRAA 61* 01/18/2014 0549   Lipase     Component Value Date/Time   LIPASE 28 01/15/2014 1307       Studies/Results: No  results found.  Anti-infectives: Anti-infectives    None       Assessment/Plan  1. 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 ABL anemia secondary to #1 and chronic anticoagulation on Eliquis  3. Previous CVA  4. History of sigmoid and cecal adenocarcinoma, s/p resections  5. Diastolic HF  6. HTN  7. A. Fib  8. H/o ovarian cancer  9. Tachy-brady syndrome, s/p pacemaker 10. Intermittent nausea/vomiting   LOS: 3 days    Plan:  1. Per Palliative Care Service: Pending palliative care consult here for goals of care. Will need to determine placement plan following discharge.  2. Intermittent N/V improving.  Bowel function has returned and patient tolerating clears. To have consult for possible palliative PEG placement 01/19/2014 (Dr. B. Grandville Silos).    Lahoma Rocker, [facility] 01/18/2014, 9:22 AM Pager: [phone]

## 2014-01-18 NOTE — Consult Note (Signed)
Patient [name]      DOB: [birth year]      FMB:[id number]     Consult Note from the Palliative Medicine Team at [facility] Requested by: Coralie Keens, PA     PCP: [name], MD Reason for Consultation: GOC, symptom management   Phone Number:[phone]  Assessment of patients Current state: I met today with [name] and and her daughter, [name], today regarding goals of care. They wish to move forward with palliative PEG to control nausea and symptoms that are likely to worsen due to her cancer without the surgical intervention. The main goal is comfort according to both of them. Ms. Lape feels comfortable at [facility] as she has been there before for rehab. They plan to go with rehab and transition into SNF there with hospice to follow when able. Ms. Kirkey tells me she has had a wonderful life and has lived a full life. She is only worried about her children because she says "I remember how it was when my mother died." Her family is the most important thing to her and she says she will be happy if she is able to be awake and talk with them. I discussed further with Staffie that we need to consider even readmission and being realistic about what we have to offer to her and ask her to go through will be more beneficial than harmful. We discussed that there will be changes and decline that will naturally occur that we will not be able to reverse. They both seem understanding. Ms. Dippolito has made peace and is ready to die when it comes her time. When discussing plans she often says "I may not even make it to that point" and she says this with ease and a smile. I will follow up tomorrow.   Goals of Care: 1.  Code Status: DNR   2. Scope of Treatment: Primary goal is for comfort. She may want temporary IV fluids if indicated and antibiotics. Avoid hospitalization.    4. Disposition: SNF rehab with plan to transition to SNF with hospice.    3. Symptom  Management:   1. Anxiety/Agitation: Lorazepam 0.5-1 mg qhs prn.  2. Pain: Morphine 2 mg every 4 hours prn. Recommend low dose roxanol for discharge to SNF to manage pain.  3. Bowel Regimen: Dulcolax supp daily prn. She may need a scheduled regimen and can consider sorbitol and senokot as tolerated and needed.  4. Fever: Acetaminophen prn.  5. Nausea/Vomiting: Move forward with palliative venting PEG placement tomorrow. Phenergan 12.5 mg every 6 hours prn. Continue protonix scheduled. Consider scheduled ondansetron ODT with continued nausea.   4. Psychosocial: Emotional support provided to patient and family.   5. Spiritual: She has been active in her "local country church" and has great support from her friends and family here.    Patient Documents Completed or Given: Document Given Completed  Advanced Directives Pkt    MOST    DNR    Gone from My Sight    Hard Choices yes     Brief HPI: 80 yo female admitted with intractable nausea/vomiting r/t partial obstruction from recurrent adenocarcinoma of colon at ileocolonic anastomosis. She also has mets to lung so surgical resection is not recommended at this point. She has had two surgical resections of colon malignancies in past 3 years and responded/recovered very well after these surgeries until recent recurrence. She is not interested in pursuing any chemotherapy/radiation options and wishes to  live well with whatever time she has left. PMH of ovarian cancer, C. Diff, diastolic CHF, atrial fibrillation, tachy-brady syndrome with pacemaker, osteoporosis, GERD, anemia, colon cancer.    ROS: + nausea/vomiting, + poor appetite. Denies pain, constipation.     PMH:  Past Medical History  Diagnosis Date  . Diastolic heart failure   . Arthritis   . History of Clostridium difficile infection     in Jan 2013  . Pacemaker   . History of cellulitis and abscess   . Hypertension     takes Ramipril,Diltiazem daily  . Atrial fibrillation      takes Coumadin daily  . Tachy-brady syndrome 2008    s/p PPM implantation by Dr Oswald Hillock takes Betapace daily  . Osteoporosis     takes Prolia every 34month injections  . Shortness of breath     with exertion;Albuterol prn  . Bruises easily     takes COumadin daily  . GERD (gastroesophageal reflux disease)     takes Protonix daily  . Constipation     Milk of Mag nightly  . Anemia     takes Iron daily  . CHF (congestive heart failure)   . Ovarian cancer   . Colon cancer      PSH: Past Surgical History  Procedure Laterality Date  . Abdominal hysterectomy    . Oophorectomy    . Colonoscopy  03/30/2011    Procedure: COLONOSCOPY;  Surgeon: JMissy Sabins MD;  [place] [facility]  Service: Endoscopy;  Laterality: N/A;  . Esophagogastroduodenoscopy  03/30/2011    Procedure: ESOPHAGOGASTRODUODENOSCOPY (EGD);  Surgeon: JMissy Sabins MD;  [facility];  Service: Endoscopy;  Laterality: N/A;  . Colon resection  04/03/2011    Procedure: COLON RESECTION LAPAROSCOPIC;  Surgeon: EGayland Curry MD;  [place]  Service: General;  Laterality: N/A;  Laparoscopic, turned open sigmoid colon resection, lysis of adhesions x 2.5 hours, rigid proctoscopy.  . Pacemaker placement  2008    MDT EnRhythm implanted by Dr ELeonia Reeves generator change (MDT Adapta L) by Dr ARayann Heman5/21/13  . Colonoscopy  12/24/2011    Procedure: COLONOSCOPY;  Surgeon: NRogene Houston MD;  [facility];  Service: Endoscopy;  Laterality: N/A;  . Bilateral cataracts    . Battery changed in pacemaker    . Laparoscopic right hemi colectomy  02/19/2012    Procedure: LAPAROSCOPIC RIGHT HEMI COLECTOMY;  Surgeon: EGayland Curry MD,FACS;  [place] [facility]  Service: General;  Laterality: Right;  attempted laparoscopic assisted right hemi colectomy converted to open.  . Partial colectomy  02/19/2012    Procedure: PARTIAL COLECTOMY;  Surgeon: EGayland Curry MD,FACS;  [place] [facility]  Service: General;   Laterality: Right;  . Lysis of adhesion  02/19/2012    Procedure: LYSIS OF ADHESION;  Surgeon: EGayland Curry MD,FACS;  [place]  Service: General;  Laterality: N/A;  . Esophagogastroduodenoscopy N/A 11/28/2013    Procedure: ESOPHAGOGASTRODUODENOSCOPY (EGD);  Surgeon: MJeryl Columbia MD;  [facility];  Service: Endoscopy;  Laterality: N/A;  . Colonoscopy with propofol N/A 11/29/2013    Procedure: COLONOSCOPY WITH PROPOFOL;  Surgeon: RCleotis Nipper MD;  [place] [facility]  Service: Endoscopy;  Laterality: N/A;   I have reviewed the [place] [facility] and  If appropriate update it with new information. Allergies  Allergen Reactions  . Amiodarone Other (See Comments)    pacemaker  . Diovan [Valsartan] Other (See Comments)  . Lasix [Furosemide] Swelling  Tolerate Bumex  . Penicillins Nausea And Vomiting and Swelling    Lip swelling and redness  . Sulfa Antibiotics Rash   Scheduled Meds: . aspirin EC  81 mg Oral Daily  . diltiazem  180 mg Oral Daily  . heparin  5,000 Units Subcutaneous 3 times per day  . pantoprazole (PROTONIX) IV  40 mg Intravenous Q24H  . sodium chloride  3 mL Intravenous Q12H  . sotalol  240 mg Oral BID   Continuous Infusions: . sodium chloride 1,000 mL (01/18/14 1409)   PRN Meds:.acetaminophen **OR** acetaminophen, hydrALAZINE, LORazepam, metoprolol, morphine injection, promethazine    BP 137/63 mmHg  Pulse 59  Temp(Src) 98.7 F (37.1 C) (Oral)  Resp 20  Ht 5' 9.5" (1.765 m)  Wt 83.099 kg (183 lb 3.2 oz)  BMI 26.68 kg/m2  SpO2 97%   PPS: 20%   Intake/Output Summary (Last 24 hours) at 01/18/14 1603 Last data filed at 01/18/14 1043  Gross per 24 hour  Intake 2507.5 ml  Output   1275 ml  Net 1232.5 ml   LBM: 01/18/14                     Physical Exam:  General: NAD, well nourished, pleasant HEENT:  [place]/AT, no JVD, moist mucous membranes Chest: CTA throughout, no labored breathing, symmetric CVS: RRR, S1 S2 Abdomen: Soft, NT, ND,  +BS Ext: MAE, no edema, warm to touch Neuro: Awake, alert, oriented x 3, follows commands  Labs: CBC    Component Value Date/Time   WBC 4.9 01/18/2014 0549   WBC 7.0 12/25/2013 1035   WBC 6.6 11/24/2013 1428   WBC 6.1 12/26/2008 0943   RBC 3.92 01/18/2014 0549   RBC 4.11 12/25/2013 1035   RBC 3.3* 11/24/2013 1428   RBC 3.63* 02/29/2012 0443   RBC 4.25 12/26/2008 0943   HGB 9.9* 01/18/2014 0549   HGB 10.3* 01/02/2014 1640   HGB 13.3 12/26/2008 0943   HCT 32.9* 01/18/2014 0549   HCT 25.0* 11/24/2013 1428   HCT 38.8 12/26/2008 0943   PLT 252 01/18/2014 0549   PLT 196 12/26/2008 0943   MCV 83.9 01/18/2014 0549   MCV 76.3* 11/24/2013 1428   MCV 91.3 12/26/2008 0943   MCH 25.3* 01/18/2014 0549   MCH 24.6* 12/25/2013 1035   MCH 23.6* 11/24/2013 1428   MCH 31.3 12/26/2008 0943   MCHC 30.1 01/18/2014 0549   MCHC 30.3* 12/25/2013 1035   MCHC 31.0* 11/24/2013 1428   MCHC 34.3 12/26/2008 0943   RDW 19.8* 01/18/2014 0549   RDW 19.8* 12/25/2013 1035   RDW 13.4 12/26/2008 0943   LYMPHSABS 1.2 01/15/2014 1307   LYMPHSABS 0.7 12/25/2013 1035   LYMPHSABS 1.0 12/26/2008 0943   MONOABS 0.9 01/15/2014 1307   MONOABS 0.5 12/26/2008 0943   EOSABS 0.1 01/15/2014 1307   EOSABS 0.1 12/25/2013 1035   BASOSABS 0.0 01/15/2014 1307   BASOSABS 0.0 12/25/2013 1035   BASOSABS 0.0 12/26/2008 0943    BMET    Component Value Date/Time   NA 141 01/18/2014 0549   NA 142 01/02/2014 1635   K 4.2 01/18/2014 0549   CL 106 01/18/2014 0549   CO2 24 01/18/2014 0549   GLUCOSE 77 01/18/2014 0549   GLUCOSE 121* 01/02/2014 1635   BUN 11 01/18/2014 0549   BUN 14 01/02/2014 1635   CREATININE 0.93 01/18/2014 0549   CALCIUM 8.2* 01/18/2014 0549   GFRNONAA 53* 01/18/2014 0549   GFRAA 61* 01/18/2014 0549  CMP     Component Value Date/Time   NA 141 01/18/2014 0549   NA 142 01/02/2014 1635   K 4.2 01/18/2014 0549   CL 106 01/18/2014 0549   CO2 24 01/18/2014 0549   GLUCOSE 77 01/18/2014 0549    GLUCOSE 121* 01/02/2014 1635   BUN 11 01/18/2014 0549   BUN 14 01/02/2014 1635   CREATININE 0.93 01/18/2014 0549   CALCIUM 8.2* 01/18/2014 0549   PROT 7.6 01/15/2014 1307   PROT 6.3 12/25/2013 1035   ALBUMIN 3.2* 01/15/2014 1307   AST 19 01/15/2014 1307   ALT 9 01/15/2014 1307   ALKPHOS 110 01/15/2014 1307   BILITOT 0.4 01/15/2014 1307   GFRNONAA 53* 01/18/2014 0549   GFRAA 61* 01/18/2014 0549     Time In Time Out Total Time Spent with Patient Total Overall Time  1430 1600 47mn 928m    Greater than 50%  of this time was spent counseling and coordinating care related to the above assessment and plan.  AlVinie SillNP Palliative Medicine Team Pager # [phone]-F 8a-5p) Team Phone # [phone]/Weekends)

## 2014-01-18 NOTE — Plan of Care (Signed)
Problem: Phase I Progression Outcomes Goal: Hemodynamically stable Outcome: Completed/Met Date Met:  01/18/14  Problem: Phase II Progression Outcomes Goal: Vital signs remain stable Outcome: Completed/Met Date Met:  01/18/14  Problem: Discharge Progression Outcomes Goal: Hemodynamically stable Outcome: Completed/Met Date Met:  01/18/14

## 2014-01-18 NOTE — Progress Notes (Signed)
Patient ID: [name], female   DOB: [birth year], 80 y.o.   MRN: [id number] For PEG tube tomorrow in endo. Procedure, risks, and benefits D/W her and her son. She agrees. Georganna Skeans, MD, MPH, FACS Trauma: [phone] General Surgery: [phone]

## 2014-01-18 NOTE — Progress Notes (Signed)
Full note to follow:  I met today with [name] and and her daughter, [name], today regarding goals of care. They wish to move forward with palliative PEG to control nausea and symptoms that are likely to worsen due to her cancer without the surgical intervention. The main goal is comfort according to both of them. [name] feels comfortable at [facility] as she has been there before for rehab. They plan to go with rehab and transition into SNF there with hospice to follow when able. [name] tells me she has had a wonderful life and has lived a full life. She is only worried about her children because she says "I remember how it was when my mother died." Her family is the most important thing to her and she says she will be happy if she is able to be awake and talk with them. I discussed further with Staffie that we need to consider even readmission and being realistic about what we have to offer to her and ask her to go through will be more beneficial than harmful. We discussed that there will be changes and decline that will naturally occur that we will not be able to reverse. They both seem understanding. [name] has made peace and is ready to die when it comes her time. When discussing plans she often says "I may not even make it to that point" and she says this with ease and a smile. I will follow up tomorrow.   Vinie Sill, NP Palliative Medicine Team Pager # [phone] (M-F 8a-5p) Team Phone # [phone] (Nights/Weekends)

## 2014-01-19 ENCOUNTER — Inpatient Hospital Stay (HOSPITAL_COMMUNITY): Payer: Medicare Other | Admitting: Anesthesiology

## 2014-01-19 ENCOUNTER — Encounter (HOSPITAL_COMMUNITY)
Admission: EM | Disposition: A | Discharge status: Hospice | Payer: Self-pay | Source: Home / Self Care | Attending: Internal Medicine

## 2014-01-19 ENCOUNTER — Encounter (HOSPITAL_COMMUNITY): Payer: Self-pay

## 2014-01-19 HISTORY — PX: PEG PLACEMENT: SHX5437

## 2014-01-19 HISTORY — PX: ESOPHAGOGASTRODUODENOSCOPY (EGD) WITH PROPOFOL: SHX5813

## 2014-01-19 LAB — CBC
HCT: 32.8 % — ABNORMAL LOW (ref 36.0–46.0)
HEMOGLOBIN: 10 g/dL — AB (ref 12.0–15.0)
MCH: 25.4 pg — ABNORMAL LOW (ref 26.0–34.0)
MCHC: 30.5 g/dL (ref 30.0–36.0)
MCV: 83.5 fL (ref 78.0–100.0)
Platelets: 252 10*3/uL (ref 150–400)
RBC: 3.93 MIL/uL (ref 3.87–5.11)
RDW: 19.6 % — ABNORMAL HIGH (ref 11.5–15.5)
WBC: 4.3 10*3/uL (ref 4.0–10.5)

## 2014-01-19 LAB — BASIC METABOLIC PANEL
Anion gap: 10 (ref 5–15)
BUN: 9 mg/dL (ref 6–23)
CHLORIDE: 107 meq/L (ref 96–112)
CO2: 26 mEq/L (ref 19–32)
Calcium: 8.2 mg/dL — ABNORMAL LOW (ref 8.4–10.5)
Creatinine, Ser: 0.89 mg/dL (ref 0.50–1.10)
GFR calc Af Amer: 65 mL/min — ABNORMAL LOW (ref >90)
GFR, EST NON AFRICAN AMERICAN: 56 mL/min — AB (ref >90)
GLUCOSE: 77 mg/dL (ref 70–99)
POTASSIUM: 4 meq/L (ref 3.7–5.3)
Sodium: 143 mEq/L (ref 137–147)

## 2014-01-19 SURGERY — INSERTION, PEG TUBE
Anesthesia: Monitor Anesthesia Care

## 2014-01-19 SURGERY — BRONCHOSCOPY, AT BEDSIDE

## 2014-01-19 MED ORDER — LACTATED RINGERS IV SOLN
INTRAVENOUS | Status: DC | PRN
  Administered 2014-01-19: 13:00:00 via INTRAVENOUS

## 2014-01-19 MED ORDER — CIPROFLOXACIN IN D5W 400 MG/200ML IV SOLN
400.0000 mg | Freq: Two times a day (BID) | INTRAVENOUS | Status: DC
  Administered 2014-01-19 – 2014-01-21 (×5): 400 mg via INTRAVENOUS
  Filled 2014-01-19 (×7): qty 200

## 2014-01-19 MED ORDER — BISACODYL 10 MG RE SUPP: 10.0000 mg | Freq: Every day | RECTAL | Status: DC | PRN

## 2014-01-19 MED ORDER — PROPOFOL INFUSION 10 MG/ML OPTIME
INTRAVENOUS | Status: DC | PRN
  Administered 2014-01-19: 100 ug/kg/min via INTRAVENOUS

## 2014-01-19 MED ORDER — LACTATED RINGERS IV SOLN
INTRAVENOUS | Status: DC
  Administered 2014-01-20: 21:00:00 via INTRAVENOUS

## 2014-01-19 MED ORDER — CIPROFLOXACIN IN D5W 400 MG/200ML IV SOLN
INTRAVENOUS | Status: AC
  Filled 2014-01-19: qty 200

## 2014-01-19 MED ORDER — FENTANYL CITRATE 0.05 MG/ML IJ SOLN: 25.0000 ug | INTRAMUSCULAR | Status: DC | PRN

## 2014-01-19 SURGICAL SUPPLY — 1 items: EndoVive Safety PEG Kit ×3 IMPLANT

## 2014-01-19 NOTE — Progress Notes (Signed)
[name] has her PEG in place and is feeling well but "full" with no nausea. Recommend trials with diet beginning with liquids and open PEG to drain with nausea and advance diet as tolerated. I would recommend trials with [facility] as she would like to take in some nutrition/calories if able. They plan for discharge to [facility] for rehab. They have no further questions/concerns. I will follow up on Monday.  Vinie Sill, NP Palliative Medicine Team Pager # [phone] (M-F 8a-5p) Team Phone # [phone] (Nights/Weekends)

## 2014-01-19 NOTE — Progress Notes (Signed)
Patient ID: [name], female   DOB: 04-May-1922, 80 y.o.   MRN: [id number] For PEG. I spoke with her and her daughter and answered their questions. Georganna Skeans, MD, MPH, FACS Trauma: [phone] General Surgery: [phone]

## 2014-01-19 NOTE — Progress Notes (Signed)
[facility]  Patient Status: Active (receiving services up to time of hospitalization)  [facility] is providing the following services: RN and PT  If patient discharges after hours, please call [phone].   [name] 01/19/2014, 4:19 PM

## 2014-01-19 NOTE — Progress Notes (Signed)
[facility]                                                                              Patient Demographics  [name], is a 80 y.o. female, DOB - May 27, 1922, GBT:[id number]  Admit date - 01/15/2014   Admitting Physician Samuella Cota, MD  Outpatient Primary MD for the patient is [name], MD  LOS - 4   Chief Complaint  Patient presents with  . Emesis      HPI on 01/15/2014  [name] is a 80 y.o. female with a history of heart failure, sick sinus syndrome, GERD, cancer who presented to [facility] with intractable n/v over past 1-2 weeks. Given specific diet and zofran during that time w/o benefit. Becoming more frequent. Daughter spoke to staff by phone who felt pt needed to come to ED. N/v worse w/ any food and even water. Unable keep anything down for several days. 2-3 bouts of emesis per day. Emesis was non-bloody and bilious. Denies diarrhea, fevers. Intermittent URI symptoms.   Assessment & Plan   Partial small bowel obstruction -Patient was sent from [facility] hospital to [facility] -This is been ongoing for several months, likely related to colon cancer status post resection -Patient required NG tube at admission, however this was discontinued  -Continue antiemetics, pain control -Surgery consulted and appreciated -Patient is to have PEG tube placed on 01/19/2014, as per surgery  Probable metastatic cancer -patient has a history of colon, ovarian, and lung cancer -continue outpatient workup per oncology  Hypertension -Continue hydralazine IV as needed  Atrial fibrillation/Tachy-brady syndrome -Pacemaker in place -Continue diltiazem and sotalol  GERD -Continue PPI  Chronic normocytic anemia -hemoglobin appears to be at baseline approximately 9 -Continue to monitor CBC -Home iron held  Code Status: DO NOT RESUSCITATE  Family Communication: none at bedside  Disposition Plan: admitted.  Pending possible Peg tube placement  today.  Time Spent in minutes   25 minutes  Procedures  Peg tube placement  Consults   General surgery Palliative care  DVT Prophylaxis  heparin  Lab Results  Component Value Date   PLT 252 01/19/2014    Medications  Scheduled Meds: . aspirin EC  81 mg Oral Daily  . diltiazem  180 mg Oral Daily  . heparin  5,000 Units Subcutaneous 3 times per day  . pantoprazole (PROTONIX) IV  40 mg Intravenous Q24H  . sodium chloride  3 mL Intravenous Q12H  . sotalol  240 mg Oral BID   Continuous Infusions: . sodium chloride 75 mL/hr at 01/19/14 0159   PRN Meds:.acetaminophen **OR** acetaminophen, bisacodyl, hydrALAZINE, LORazepam, metoprolol, morphine injection, promethazine  Antibiotics    Anti-infectives    None      Subjective:   [name] seen and examined today.   Patient no longer complains of nausea, vomiting, abdominal pain.  She is looking forward to her peg tube.  She denies chest pain or shortness of breath at this time.  Objective:   Filed Vitals:   01/18/14 1413 01/18/14 2316 01/19/14 0656 01/19/14 0941  BP: 137/63 152/62 132/58 131/60  Pulse: 59 61 66   Temp: 98.7 F (37.1 C)  99.1 F (37.3 C) 99.6 F (37.6 C)   TempSrc: Oral Oral Oral   Resp: 20 18 20    Height:      Weight:      SpO2: 97% 94% 94%     Wt Readings from Last 3 Encounters:  01/16/14 83.099 kg (183 lb 3.2 oz)  12/25/13 86.637 kg (191 lb)  12/13/13 83.915 kg (185 lb)     Intake/Output Summary (Last 24 hours) at 01/19/14 1025 Last data filed at 01/19/14 4287  Gross per 24 hour  Intake 2335.5 ml  Output   2300 ml  Net   35.5 ml    Exam  General: Well developed, well nourished, NAD, appears stated age  48: NCAT, mucous membranes moist.   Cardiovascular: S1 S2 auscultated, RRR  Respiratory: Clear to auscultation bilaterally with equal chest rise  Abdomen: Soft, nontender, nondistended, + bowel sounds  Extremities: warm dry without cyanosis clubbing or edema  Neuro:  AAOx3, no focal deficit  Psych: appropriate affect and demeanor  Data Review   Micro Results No results found for this or any previous visit (from the past 240 hour(s)).  Radiology Reports Dg Chest 1 View  01/16/2014   CLINICAL DATA:  Weakness, status post nasogastric catheter placement  EXAM: CHEST - 1 VIEW  COMPARISON:  01/15/2014  FINDINGS: Cardiac shadow is stable. A pacing device is again seen. Bilateral lung masses are again noted and stable. A nasogastric catheter has been placed with its tip within the stomach beyond the edge of the film. No bony abnormality is noted.  IMPRESSION: Nasogastric catheter within the stomach.   Electronically Signed   By: Inez Catalina M.D.   On: 01/16/2014 08:45   Dg Chest 2 View  01/15/2014   CLINICAL DATA:  Shortness of Breath.  Colon carcinoma  EXAM: CHEST  2 VIEW  COMPARISON:  December 13, 2013  FINDINGS: There is a mass in the superior segment of the right lower lobe measuring 5.0 x 4.4 x 4.3 cm. There is a mass in the superior segment of the left lower lobe measuring 3.0 x 2.4 by 2.2 cm. There is no edema or consolidation. Heart is mildly enlarged with pulmonary vascularity within normal limits. Pacemaker leads are attached to the right atrium and right ventricle. No adenopathy. No bone lesions.  IMPRESSION: Masses in the superior segment of each lower lobe. These masses appear larger than on recent prior study. Metastatic disease is felt to be present.  No edema or consolidation.  Stable cardiac enlargement.   Electronically Signed   By: Lowella Grip M.D.   On: 01/15/2014 16:20   Dg Abd 1 View  01/15/2014   CLINICAL DATA:  Nausea vomiting today. Partial small bowel obstruction on an abdomen and pelvis CT earlier today. Nasogastric tube placement.  EXAM: ABDOMEN - 1 VIEW  COMPARISON:  Abdomen and pelvis CT obtained earlier today.  FINDINGS: Nasogastric tube tip in the region of the gastric pylorus or duodenal bulb. CT oral contrast in normal caliber  and dilated small bowel loops. Left pelvic and lower abdominal surgical clips. Lumbar spine degenerative changes and mild scoliosis.  IMPRESSION: Stable pattern of partial small bowel obstruction. Nasogastric tube tip in the gastric pylorus or duodenal bulb.   Electronically Signed   By: Enrique Sack M.D.   On: 01/15/2014 21:15   Ct Abdomen Pelvis W Contrast  01/15/2014   CLINICAL DATA:  Shortness of breath. Abdominal pain. History of recurrent colon cancer on chemotherapy.  EXAM: CT  ABDOMEN AND PELVIS WITH CONTRAST  TECHNIQUE: Multidetector CT imaging of the abdomen and pelvis was performed using the standard protocol following bolus administration of intravenous contrast.  CONTRAST:  65mL OMNIPAQUE IOHEXOL 300 MG/ML SOLN, 49mL OMNIPAQUE IOHEXOL 300 MG/ML SOLN  COMPARISON:  CT 11/30/2013  FINDINGS: Previously seen bilateral lower lobe masses are not imaged on the CT abdomen. Heart is borderline in size. Dense coronary artery calcifications. Pacer wires noted in the right heart.  Tiny hypodensity seen in the inferior right hepatic lobe again noted and stable. Spleen, pancreas, adrenals have an unremarkable appearance. Stable small cyst in the midpole of the right kidney. No hydronephrosis.  Changes of right hemicolectomy. Surgical changes also noted in the sigmoid colon. There are dilated small bowel loops in the abdomen. Distal small bowel loops are decompressed. Findings compatible with small bowel obstruction. Transition is in the right lower quadrant, best seen on image 51 of series 2, where the out flowing small bowel loop appears thick-walled. This is of unknown etiology. This could reflect an area of wall thickening due to inflammation/infection. Tumor cannot be excluded. Stomach is unremarkable.  Aorta and iliac vessels are calcified, non aneurysmal. No free fluid or free air. Prior hysterectomy. No adnexal masses. Urinary bladder is unremarkable.  Decreasing presacral edema and soft tissue thickening,  likely improving postsurgical changes. Soft tissue mass in the anterior abdominal wall in the region of the right rectus muscle is again noted measuring 2.9 x 2.1 cm compared with 2.5 x 2.3 cm previously.  Degenerative changes in the lumbar spine. No acute bony abnormality or focal bone lesion.  IMPRESSION: Dilated proximal small bowel loops with transition point in the right lower quadrant an area of circumferential wall thickening within the small bowel. Findings compatible with partial small bowel obstruction. Wall thickening could be related to inflammation/infection, but malignancy cannot be excluded.  Postsurgical changes in the right colon and sigmoid colon.  Anterior abdominal wall nodule again noted, possibly slightly larger.   Electronically Signed   By: Rolm Baptise M.D.   On: 01/15/2014 16:51    CBC  Recent Labs Lab 01/15/14 1307 01/18/14 0549 01/19/14 0455  WBC 7.5 4.9 4.3  HGB 11.7* 9.9* 10.0*  HCT 38.5 32.9* 32.8*  PLT 351 252 252  MCV 84.2 83.9 83.5  MCH 25.6* 25.3* 25.4*  MCHC 30.4 30.1 30.5  RDW 19.8* 19.8* 19.6*  LYMPHSABS 1.2  --   --   MONOABS 0.9  --   --   EOSABS 0.1  --   --   BASOSABS 0.0  --   --     Chemistries   Recent Labs Lab 01/15/14 1307 01/18/14 0549 01/19/14 0455  NA 140 141 143  K 4.6 4.2 4.0  CL 101 106 107  CO2 29 24 26   GLUCOSE 106* 77 77  BUN 21 11 9   CREATININE 1.11* 0.93 0.89  CALCIUM 9.5 8.2* 8.2*  AST 19  --   --   ALT 9  --   --   ALKPHOS 110  --   --   BILITOT 0.4  --   --    ------------------------------------------------------------------------------------------------------------------ estimated creatinine clearance is 49.9 mL/min (by C-G formula based on Cr of 0.89). ------------------------------------------------------------------------------------------------------------------ No results for input(s): HGBA1C in the last 72  hours. ------------------------------------------------------------------------------------------------------------------ No results for input(s): CHOL, HDL, LDLCALC, TRIG, CHOLHDL, LDLDIRECT in the last 72 hours. ------------------------------------------------------------------------------------------------------------------ No results for input(s): TSH, T4TOTAL, T3FREE, THYROIDAB in the last 72 hours.  Invalid input(s): FREET3 ------------------------------------------------------------------------------------------------------------------  No results for input(s): VITAMINB12, FOLATE, FERRITIN, TIBC, IRON, RETICCTPCT in the last 72 hours.  Coagulation profile No results for input(s): INR, PROTIME in the last 168 hours.  No results for input(s): DDIMER in the last 72 hours.  Cardiac Enzymes No results for input(s): CKMB, TROPONINI, MYOGLOBIN in the last 168 hours.  Invalid input(s): CK ------------------------------------------------------------------------------------------------------------------ Invalid input(s): POCBNP    Javonn Gauger D.O. on 01/19/2014 at 10:25 AM  Between 7am to 7pm - Pager - [phone]  After 7pm go to [email or web address] - password [email or web address]  And look for the night coverage person covering for me after hours  [facility] Office  [phone]

## 2014-01-19 NOTE — Anesthesia Postprocedure Evaluation (Signed)
  Anesthesia Post-op Note  Patient: [name]  Procedure(s) Performed: Procedure(s): PERCUTANEOUS ENDOSCOPIC GASTROSTOMY (PEG) PLACEMENT (N/A) ESOPHAGOGASTRODUODENOSCOPY (EGD) WITH PROPOFOL (N/A)  Patient Location: Endoscopy Unit  Anesthesia Type:MAC  Level of Consciousness: awake, alert , oriented and patient cooperative  Airway and Oxygen Therapy: Patient Spontanous Breathing and Patient connected to nasal cannula oxygen  Post-op Pain: none  Post-op Assessment: Post-op Vital signs reviewed, Patient's Cardiovascular Status Stable, Respiratory Function Stable, Patent Airway, No signs of Nausea or vomiting and Pain level controlled  Post-op Vital Signs: Reviewed and stable  Last Vitals:  Filed Vitals:   01/19/14 1410  BP: 140/60  Pulse: 59  Temp:   Resp: 17    Complications: No apparent anesthesia complications

## 2014-01-19 NOTE — Progress Notes (Signed)
LOS: 4 days   Subjective: Feeling well.    Objective: Vital signs in last 24 hours: Temp:  [98.2 F (36.8 C)-99.6 F (37.6 C)] 99.6 F (37.6 C) (11/06 0656) Pulse Rate:  [59-66] 66 (11/06 0656) Resp:  [18-22] 20 (11/06 0656) BP: (131-152)/(58-63) 131/60 mmHg (11/06 0941) SpO2:  [94 %-97 %] 94 % (11/06 0656) Last BM Date: 01/18/14   Laboratory  CBC  Recent Labs  01/18/14 0549 01/19/14 0455  WBC 4.9 4.3  HGB 9.9* 10.0*  HCT 32.9* 32.8*  PLT 252 252   BMET  Recent Labs  01/18/14 0549 01/19/14 0455  NA 141 143  K 4.2 4.0  CL 106 107  CO2 24 26  GLUCOSE 77 77  BUN 11 9  CREATININE 0.93 0.89  CALCIUM 8.2* 8.2*     Physical Exam General appearance: alert, cooperative and no distress GI: normal findings: soft, non-tender and NABS   Assessment:  1. 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 ABL anemia secondary to #1 and chronic anticoagulation on Eliquis  3. Previous CVA  4. History of sigmoid and cecal adenocarcinoma, s/p resections  5. Diastolic HF  6. HTN  7. A. Fib  8. H/o ovarian cancer  9. Tachy-brady syndrome, s/p pacemaker 10. Intermittent nausea/vomiting   Plan:  For palliative PEG placement today with Dr. Grandville Silos. Per palliative care, likely d/c to [facility] with transition to SNF and/or Hospice as needs indicate.     Lahoma Rocker, [facility] Surgery Pager [phone] 01/19/2014

## 2014-01-19 NOTE — Anesthesia Preprocedure Evaluation (Addendum)
Anesthesia Evaluation  Patient identified by MRN, date of birth, ID band Patient awake    Reviewed: Allergy & Precautions, H&P , NPO status , Patient's Chart, lab work & pertinent test results, reviewed documented beta blocker date and time   History of Anesthesia Complications Negative for: history of anesthetic complications  Airway Mallampati: II  TM Distance: >3 FB Neck ROM: Full    Dental  (+) Teeth Intact, Dental Advisory Given   Pulmonary neg pulmonary ROS,  breath sounds clear to auscultation        Cardiovascular hypertension, Pt. on medications and Pt. on home beta blockers - angina+ dysrhythmias Atrial Fibrillation + pacemaker (for SA node dysfunction) Rhythm:Regular Rate:Normal     Neuro/Psych CVA    GI/Hepatic Neg liver ROS, GERD-  Medicated and Controlled,Colon cancer   Endo/Other  negative endocrine ROS  Renal/GU negative Renal ROS     Musculoskeletal   Abdominal   Peds  Hematology  (+) Blood dyscrasia (Coumadin, Hb 10.0), ,   Anesthesia Other Findings   Reproductive/Obstetrics                            Anesthesia Physical Anesthesia Plan  ASA: III  Anesthesia Plan: MAC   Post-op Pain Management:    Induction: Intravenous  Airway Management Planned: Nasal Cannula  Additional Equipment:   Intra-op Plan:   Post-operative Plan:   Informed Consent: I have reviewed the patients History and Physical, chart, labs and discussed the procedure including the risks, benefits and alternatives for the proposed anesthesia with the patient or authorized representative who has indicated his/her understanding and acceptance.   Dental advisory given  Plan Discussed with: CRNA and Surgeon  Anesthesia Plan Comments: (Plan routine monitors, MAC)        Anesthesia Quick Evaluation

## 2014-01-19 NOTE — Progress Notes (Signed)
Orthopedic Tech Progress Note Patient Details:  [name] 1922/10/06 [id number]  Ortho Devices Type of Ortho Device: Abdominal binder Ortho Device/Splint Location: put at bedside Ortho Device/Splint Interventions: Criss Alvine 01/19/2014, 3:58 PM

## 2014-01-19 NOTE — Op Note (Signed)
01/15/2014 - 01/19/2014  1:43 PM  PATIENT:  [name]  80 y.o. female  PRE-OPERATIVE DIAGNOSIS:  Abdominal carcinoma with intermitent bowel obstruction  POST-OPERATIVE DIAGNOSIS:  PEG placement LUQ  PROCEDURE:  Procedure(s): PERCUTANEOUS ENDOSCOPIC GASTROSTOMY (PEG) PLACEMENT ESOPHAGOGASTRODUODENOSCOPY (EGD) WITH PROPOFOL  SURGEON:  Surgeon(s): Georganna Skeans, MD  ASSISTANTS: Silvestre Gunner, [facility]   ANESTHESIA:   local and MAC  EBL:  Total I/O In: 203 [I.V.:203] Out: -   BLOOD ADMINISTERED:none  DRAINS: none   SPECIMEN:  No Specimen  DISPOSITION OF SPECIMEN:  N/A  COUNTS:  YES  DICTATION: Viviann Spare Dictation patient presents for PEG tube placement and EGD. Informed consent was obtained. She received intravenous antibiotics. She was identified in the preop holding area. She was brought to the endo suite. Mac anesthesia was administered by the anesthesia staff. We did time out procedure. Bite-block was placed. Scope was introduced through mouth down into her esophagus. There were no gross lesions. Scope was advanced into her stomach. There are some small erosions from her previous nasogastric tube but no other abnormalities. Scope was advanced to the first portion of the duodenum. There were no ulcers or obstructions. Scope was withdrawn back into the stomach and it was insufflated further. An excellent poke site was obtained on the abdominal wall. Abdomen was prepped and draped in a sterile fashion. Local anesthetic was injected. Small incision was made at the poke site. Angiocath was inserted under direct vision followed by a guidewire. Guidewire was grasped with endoscopic snare area guidewire was brought out through the mouth and the PEG tube was lubricated and attached. PICC tip is brought out through the abdominal wall. Scope was reintroduced into the stomach and PEG tube location was verified. We took a picture. Flange was placed and adjusted. Antibiotic ointment was placed  around the PEG site. Stomach was evacuated and the scope was withdrawn. Patient tolerated the procedure well without apparent complication and was taken recovery area in stable condition.  PATIENT DISPOSITION:  PACU - hemodynamically stable.   Delay start of Pharmacological VTE agent (>24hrs) due to surgical blood loss or risk of bleeding:  no  Georganna Skeans, MD, MPH, FACS Pager: [phone]  11/6/20151:43 PM

## 2014-01-19 NOTE — Plan of Care (Signed)
Problem: Phase I Progression Outcomes Goal: Initial discharge plan identified Outcome: Completed/Met Date Met:  01/19/14 Goal: Other Phase I Outcomes/Goals Outcome: Completed/Met Date Met:  01/19/14  Problem: Phase II Progression Outcomes Goal: Discharge plan established Outcome: Progressing  Problem: Phase III Progression Outcomes Goal: Activity at appropriate level-compared to baseline (UP IN CHAIR FOR HEMODIALYSIS)  Outcome: Progressing

## 2014-01-19 NOTE — Transfer of Care (Signed)
Immediate Anesthesia Transfer of Care Note  Patient: [name]  Procedure(s) Performed: Procedure(s): PERCUTANEOUS ENDOSCOPIC GASTROSTOMY (PEG) PLACEMENT (N/A) ESOPHAGOGASTRODUODENOSCOPY (EGD) WITH PROPOFOL (N/A)  Patient Location: PACU and Endoscopy Unit  Anesthesia Type:MAC  Level of Consciousness: awake, alert , oriented and patient cooperative  Airway & Oxygen Therapy: Patient Spontanous Breathing and Patient connected to nasal cannula oxygen  Post-op Assessment: Report given to PACU RN, Post -op Vital signs reviewed and stable and Patient moving all extremities X 4  Post vital signs: Reviewed and stable  Complications: No apparent anesthesia complications

## 2014-01-20 DIAGNOSIS — Z515 Encounter for palliative care: Secondary | ICD-10-CM

## 2014-01-20 LAB — BASIC METABOLIC PANEL
ANION GAP: 13 (ref 5–15)
BUN: 10 mg/dL (ref 6–23)
CO2: 22 meq/L (ref 19–32)
CREATININE: 0.78 mg/dL (ref 0.50–1.10)
Calcium: 8.1 mg/dL — ABNORMAL LOW (ref 8.4–10.5)
Chloride: 103 mEq/L (ref 96–112)
GFR calc Af Amer: 83 mL/min — ABNORMAL LOW (ref >90)
GFR calc non Af Amer: 72 mL/min — ABNORMAL LOW (ref >90)
Glucose, Bld: 114 mg/dL — ABNORMAL HIGH (ref 70–99)
Potassium: 3.5 mEq/L — ABNORMAL LOW (ref 3.7–5.3)
Sodium: 138 mEq/L (ref 137–147)

## 2014-01-20 LAB — CBC
HEMATOCRIT: 34 % — AB (ref 36.0–46.0)
Hemoglobin: 10.4 g/dL — ABNORMAL LOW (ref 12.0–15.0)
MCH: 25.5 pg — ABNORMAL LOW (ref 26.0–34.0)
MCHC: 30.6 g/dL (ref 30.0–36.0)
MCV: 83.3 fL (ref 78.0–100.0)
Platelets: 236 10*3/uL (ref 150–400)
RBC: 4.08 MIL/uL (ref 3.87–5.11)
RDW: 19.8 % — ABNORMAL HIGH (ref 11.5–15.5)
WBC: 12.7 10*3/uL — ABNORMAL HIGH (ref 4.0–10.5)

## 2014-01-20 MED ORDER — PANTOPRAZOLE SODIUM 40 MG PO TBEC
40.0000 mg | DELAYED_RELEASE_TABLET | Freq: Two times a day (BID) | ORAL | Status: DC
  Administered 2014-01-20 – 2014-01-22 (×4): 40 mg via ORAL
  Filled 2014-01-20 (×4): qty 1

## 2014-01-20 MED ORDER — BOOST / RESOURCE BREEZE PO LIQD
1.0000 | Freq: Three times a day (TID) | ORAL | Status: DC
  Administered 2014-01-20 – 2014-01-22 (×5): 1 via ORAL

## 2014-01-20 MED ORDER — POTASSIUM CHLORIDE 20 MEQ PO PACK
40.0000 meq | PACK | Freq: Once | ORAL | Status: DC
  Filled 2014-01-20: qty 2

## 2014-01-20 MED ORDER — POTASSIUM CHLORIDE CRYS ER 20 MEQ PO TBCR
40.0000 meq | EXTENDED_RELEASE_TABLET | Freq: Once | ORAL | Status: AC
  Administered 2014-01-20: 40 meq via ORAL
  Filled 2014-01-20 (×2): qty 2

## 2014-01-20 MED ORDER — ACETAMINOPHEN 500 MG PO TABS
1000.0000 mg | ORAL_TABLET | ORAL | Status: DC | PRN
  Administered 2014-01-21: 1000 mg via ORAL
  Filled 2014-01-20 (×2): qty 2

## 2014-01-20 NOTE — Plan of Care (Signed)
Problem: Phase III Progression Outcomes Goal: Voiding independently Outcome: Completed/Met Date Met:  01/20/14

## 2014-01-20 NOTE — Progress Notes (Addendum)
[facility]                                                                              Patient Demographics  [name], is a 80 y.o. female, DOB - [birth year], KWI:[id number]  Admit date - 01/15/2014   Admitting Physician Samuella Cota, MD  Outpatient Primary MD for the patient is [name], MD  LOS - 5   Chief Complaint  Patient presents with  . Emesis      HPI on 01/15/2014  [name] is a 80 y.o. female with a history of heart failure, sick sinus syndrome, GERD, cancer who presented to [facility] with intractable n/v over past 1-2 weeks. Given specific diet and zofran during that time w/o benefit. Becoming more frequent. Daughter spoke to staff by phone who felt pt needed to come to ED. N/v worse w/ any food and even water. Unable keep anything down for several days. 2-3 bouts of emesis per day. Emesis was non-bloody and bilious. Denies diarrhea, fevers. Intermittent URI symptoms.   Assessment & Plan   Partial small bowel obstruction -Patient was sent from [facility] to [facility] -This is been ongoing for several months, likely related to colon cancer status post resection -Patient required NG tube at admission, however this was discontinued  -Continue antiemetics, pain control -Surgery consulted and appreciated and PEG tube inserted on 01/19/2014 -Patient continues to be on clear liquid diet -Will advance as tolerated -Palliative care following and recommended diet beginning with liquids and open PEG to drain with nausea   Probable metastatic cancer -patient has a history of colon, ovarian, and lung cancer -continue outpatient workup per oncology  Hypertension -Continue hydralazine IV as needed  Atrial fibrillation/Tachy-brady syndrome -Pacemaker in place -Continue diltiazem and sotalol  GERD -Continue PPI  Chronic normocytic anemia -hemoglobin appears to be at baseline approximately 9 -Continue to monitor CBC -Home iron  held  Deconditioning -PT recommended home health (however this was 11/3) -OT did not recommend further therapy -Will ask PT and OT to reevaluate patient  Code Status: DO NOT RESUSCITATE  Family Communication: none at bedside.  Spoke with son and daughter via phone.  Disposition Plan: admitted.   Time Spent in minutes   25 minutes  Procedures  Peg tube placement  Consults   General surgery Palliative care  DVT Prophylaxis  heparin  Lab Results  Component Value Date   PLT 236 01/20/2014    Medications  Scheduled Meds: . aspirin EC  81 mg Oral Daily  . ciprofloxacin  400 mg Intravenous Q12H  . diltiazem  180 mg Oral Daily  . heparin  5,000 Units Subcutaneous 3 times per day  . pantoprazole (PROTONIX) IV  40 mg Intravenous Q24H  . potassium chloride  40 mEq Oral Once  . sodium chloride  3 mL Intravenous Q12H  . sotalol  240 mg Oral BID   Continuous Infusions: . sodium chloride 75 mL/hr at 01/20/14 0802  . lactated ringers     PRN Meds:.acetaminophen **OR** acetaminophen, bisacodyl, hydrALAZINE, LORazepam, metoprolol, morphine injection, promethazine  Antibiotics    Anti-infectives    Start     Dose/Rate Route Frequency Ordered  Stop   01/19/14 1400  ciprofloxacin (CIPRO) IVPB 400 mg     400 mg200 mL/hr over 60 Minutes Intravenous Every 12 hours 01/19/14 1321        Subjective:   [name] seen and examined today.   Patient no longer complains of nausea, vomiting, abdominal pain.  She denies chest pain or shortness of breath at this time.  Objective:   Filed Vitals:   01/19/14 1410 01/19/14 2121 01/19/14 2346 01/20/14 0441  BP: 140/60 130/43 114/44 131/55  Pulse: 59 60 59 60  Temp:  97.8 F (36.6 C)  98.1 F (36.7 C)  TempSrc:  Oral  Oral  Resp: 17 16    Height:      Weight:      SpO2: 96% 96%  92%    Wt Readings from Last 3 Encounters:  01/16/14 83.099 kg (183 lb 3.2 oz)  12/25/13 86.637 kg (191 lb)  12/13/13 83.915 kg (185 lb)      Intake/Output Summary (Last 24 hours) at 01/20/14 1023 Last data filed at 01/20/14 0933  Gross per 24 hour  Intake   1238 ml  Output   1500 ml  Net   -262 ml    Exam  General: Well developed, well nourished, NAD, appears stated age  61: NCAT, mucous membranes moist.   Cardiovascular: S1 S2 auscultated, RRR  Respiratory: Clear to auscultation bilaterally with equal chest rise  Abdomen: Soft, nontender, nondistended, + bowel sounds  Extremities: warm dry without cyanosis clubbing or edema  Neuro: AAOx3, no focal deficit  Psych: appropriate affect and demeanor  Data Review   Micro Results No results found for this or any previous visit (from the past 240 hour(s)).  Radiology Reports Dg Chest 1 View  01/16/2014   CLINICAL DATA:  Weakness, status post nasogastric catheter placement  EXAM: CHEST - 1 VIEW  COMPARISON:  01/15/2014  FINDINGS: Cardiac shadow is stable. A pacing device is again seen. Bilateral lung masses are again noted and stable. A nasogastric catheter has been placed with its tip within the stomach beyond the edge of the film. No bony abnormality is noted.  IMPRESSION: Nasogastric catheter within the stomach.   Electronically Signed   By: Inez Catalina M.D.   On: 01/16/2014 08:45   Dg Chest 2 View  01/15/2014   CLINICAL DATA:  Shortness of Breath.  Colon carcinoma  EXAM: CHEST  2 VIEW  COMPARISON:  December 13, 2013  FINDINGS: There is a mass in the superior segment of the right lower lobe measuring 5.0 x 4.4 x 4.3 cm. There is a mass in the superior segment of the left lower lobe measuring 3.0 x 2.4 by 2.2 cm. There is no edema or consolidation. Heart is mildly enlarged with pulmonary vascularity within normal limits. Pacemaker leads are attached to the right atrium and right ventricle. No adenopathy. No bone lesions.  IMPRESSION: Masses in the superior segment of each lower lobe. These masses appear larger than on recent prior study. Metastatic disease is  felt to be present.  No edema or consolidation.  Stable cardiac enlargement.   Electronically Signed   By: Lowella Grip M.D.   On: 01/15/2014 16:20   Dg Abd 1 View  01/15/2014   CLINICAL DATA:  Nausea vomiting today. Partial small bowel obstruction on an abdomen and pelvis CT earlier today. Nasogastric tube placement.  EXAM: ABDOMEN - 1 VIEW  COMPARISON:  Abdomen and pelvis CT obtained earlier today.  FINDINGS: Nasogastric tube tip  in the region of the gastric pylorus or duodenal bulb. CT oral contrast in normal caliber and dilated small bowel loops. Left pelvic and lower abdominal surgical clips. Lumbar spine degenerative changes and mild scoliosis.  IMPRESSION: Stable pattern of partial small bowel obstruction. Nasogastric tube tip in the gastric pylorus or duodenal bulb.   Electronically Signed   By: Enrique Sack M.D.   On: 01/15/2014 21:15   Ct Abdomen Pelvis W Contrast  01/15/2014   CLINICAL DATA:  Shortness of breath. Abdominal pain. History of recurrent colon cancer on chemotherapy.  EXAM: CT ABDOMEN AND PELVIS WITH CONTRAST  TECHNIQUE: Multidetector CT imaging of the abdomen and pelvis was performed using the standard protocol following bolus administration of intravenous contrast.  CONTRAST:  62mL OMNIPAQUE IOHEXOL 300 MG/ML SOLN, 32mL OMNIPAQUE IOHEXOL 300 MG/ML SOLN  COMPARISON:  CT 11/30/2013  FINDINGS: Previously seen bilateral lower lobe masses are not imaged on the CT abdomen. Heart is borderline in size. Dense coronary artery calcifications. Pacer wires noted in the right heart.  Tiny hypodensity seen in the inferior right hepatic lobe again noted and stable. Spleen, pancreas, adrenals have an unremarkable appearance. Stable small cyst in the midpole of the right kidney. No hydronephrosis.  Changes of right hemicolectomy. Surgical changes also noted in the sigmoid colon. There are dilated small bowel loops in the abdomen. Distal small bowel loops are decompressed. Findings compatible with  small bowel obstruction. Transition is in the right lower quadrant, best seen on image 51 of series 2, where the out flowing small bowel loop appears thick-walled. This is of unknown etiology. This could reflect an area of wall thickening due to inflammation/infection. Tumor cannot be excluded. Stomach is unremarkable.  Aorta and iliac vessels are calcified, non aneurysmal. No free fluid or free air. Prior hysterectomy. No adnexal masses. Urinary bladder is unremarkable.  Decreasing presacral edema and soft tissue thickening, likely improving postsurgical changes. Soft tissue mass in the anterior abdominal wall in the region of the right rectus muscle is again noted measuring 2.9 x 2.1 cm compared with 2.5 x 2.3 cm previously.  Degenerative changes in the lumbar spine. No acute bony abnormality or focal bone lesion.  IMPRESSION: Dilated proximal small bowel loops with transition point in the right lower quadrant an area of circumferential wall thickening within the small bowel. Findings compatible with partial small bowel obstruction. Wall thickening could be related to inflammation/infection, but malignancy cannot be excluded.  Postsurgical changes in the right colon and sigmoid colon.  Anterior abdominal wall nodule again noted, possibly slightly larger.   Electronically Signed   By: Rolm Baptise M.D.   On: 01/15/2014 16:51    CBC  Recent Labs Lab 01/15/14 1307 01/18/14 0549 01/19/14 0455 01/20/14 0305  WBC 7.5 4.9 4.3 12.7*  HGB 11.7* 9.9* 10.0* 10.4*  HCT 38.5 32.9* 32.8* 34.0*  PLT 351 252 252 236  MCV 84.2 83.9 83.5 83.3  MCH 25.6* 25.3* 25.4* 25.5*  MCHC 30.4 30.1 30.5 30.6  RDW 19.8* 19.8* 19.6* 19.8*  LYMPHSABS 1.2  --   --   --   MONOABS 0.9  --   --   --   EOSABS 0.1  --   --   --   BASOSABS 0.0  --   --   --     Chemistries   Recent Labs Lab 01/15/14 1307 01/18/14 0549 01/19/14 0455 01/20/14 0305  NA 140 141 143 138  K 4.6 4.2 4.0 3.5*  CL 101 106 107  103  CO2 29 24 26  22   GLUCOSE 106* 77 77 114*  BUN 21 11 9 10   CREATININE 1.11* 0.93 0.89 0.78  CALCIUM 9.5 8.2* 8.2* 8.1*  AST 19  --   --   --   ALT 9  --   --   --   ALKPHOS 110  --   --   --   BILITOT 0.4  --   --   --    ------------------------------------------------------------------------------------------------------------------ estimated creatinine clearance is 55.5 mL/min (by C-G formula based on Cr of 0.78). ------------------------------------------------------------------------------------------------------------------ No results for input(s): HGBA1C in the last 72 hours. ------------------------------------------------------------------------------------------------------------------ No results for input(s): CHOL, HDL, LDLCALC, TRIG, CHOLHDL, LDLDIRECT in the last 72 hours. ------------------------------------------------------------------------------------------------------------------ No results for input(s): TSH, T4TOTAL, T3FREE, THYROIDAB in the last 72 hours.  Invalid input(s): FREET3 ------------------------------------------------------------------------------------------------------------------ No results for input(s): VITAMINB12, FOLATE, FERRITIN, TIBC, IRON, RETICCTPCT in the last 72 hours.  Coagulation profile No results for input(s): INR, PROTIME in the last 168 hours.  No results for input(s): DDIMER in the last 72 hours.  Cardiac Enzymes No results for input(s): CKMB, TROPONINI, MYOGLOBIN in the last 168 hours.  Invalid input(s): CK ------------------------------------------------------------------------------------------------------------------ Invalid input(s): POCBNP    Oliwia Berzins D.O. on 01/20/2014 at 10:23 AM  Between 7am to 7pm - Pager - [phone]  After 7pm go to [email or web address] - password [email or web address]  And look for the night coverage person covering for me after hours  [facility] Office  [phone]

## 2014-01-20 NOTE — Clinical Social Work Psychosocial (Signed)
Clinical Social Work Department BRIEF PSYCHOSOCIAL ASSESSMENT 01/20/2014  Patient:  [name],[name]     Account Number:  [id number]     Admit date:  01/15/2014  Clinical Social Worker:  Patrick-jefferson,, LCSWA  Date/Time:  01/20/2014 05:52 PM  Referred by:  Physician  Date Referred:  01/20/2014 Referred for  SNF Placement   Other Referral:   Interview type:  Family Other interview type:   [name] (daughter)    PSYCHOSOCIAL DATA Living Status:  ALONE Admitted from facility:   Level of care:   Primary support name:  [name] ([phone]) Primary support relationship to patient:  CHILD, ADULT Degree of support available:   Good. Patient's son also present at bedside.    CURRENT CONCERNS Current Concerns  Post-Acute Placement   Other Concerns:    SOCIAL WORK ASSESSMENT / PLAN CSW met with patient, daughter, son, and other family present in the room. CSW introduced self and explained role. CSW explained SNF process and discussed d/c process. Per daughter, patient has been to [facility] in the past and family is agreeable to patient going to [facility] again. Carla states family cannot manage patient at home. Carla reports she lives in [place] and works. Daughter also states older brother works and younger brother has a son with muscular dystrophy. Daughter expressed concern with patient being home alone given her recent PEG tube and recurrent falls. Carla reports patient knows staff at [facility] and is comfortable with being a resident there as both sons are within driving distance. Daughter states patient has lived a "very full life," and family prefers for her to live peacefully at facility until the end. Per daughter, she has spoken to [name] at [facility] regarding admission. Carla became emotional and stated she just wants patient to "be comfortable." CSW provided appropriate emotional support.   Assessment/plan status:  Other - See  comment Other assessment/ plan:   CSW to update FL2 for SNF placement.   Information/referral to community resources:    PATIENT'S/FAMILY'S RESPONSE TO PLAN OF CARE: Carla was cooperative and pleasant and expressed genuine concern in ensuring patient is "comfortable."     Patrick-Jefferson, LCSWA Weekend Clinical Social Worker [phone]  

## 2014-01-20 NOTE — Progress Notes (Signed)
Palliative Medicine Team Progress Note   S: "Feel bad". Starting to feel full and is having more nausea. PEG has not been "vented" yet. "Weak"  Filed Vitals:   01/20/14 1334  BP: 141/65  Pulse: 60  Temp: 99.3 F (37.4 C)  Resp: 18   Slightly tender at PEG site, abdomen is distended. Alert oriented and conversant. Extremities normal Heart IRIR, rate normal Lungs clear  Assessment: 80 yo woman with metastatic colon cancer with partial malignant obstruction now s/p venting PEG tube placement. This PEG is for comfort only-should not be used for supplement or continuous feeding. She should be allowed oral intake as tolerated for comfort.   Plan:   I provided staff and patient a handout on the "Venting PEG" and how to manage this   Provided education at bedside on food choices and PRN venting if nauseated or feeling uncomfortable fullness.  Changed IV protonix to oral BID to reduce stomach acid secretions  May consider adding on some low dose decadron for appetite stimulation and to reduce bowel edema in the event of complete obstruction  Tylenol and pain  Change IV Morphine to Roxanol SL liquid at discharge for pain and discomfort  Needs a stimulant/softner bowel regimen with PRN dulcolax  Ativan PRN for sleep and anxiety  If vomiting occurs PEG should be placed to intermittent suction  Discontinued telemetry for her comfort  Time: 35 minutes Greater than 50%  of this time was spent counseling and coordinating care related to the above assessment and plan.    Lane Hacker, DO Palliative Medicine

## 2014-01-20 NOTE — Progress Notes (Addendum)
1 Day Post-Op  Subjective: Minimal soreness from PEG, tolerated clears but feels full  Objective: Vital signs in last 24 hours: Temp:  [97.8 F (36.6 C)-98.2 F (36.8 C)] 98.1 F (36.7 C) (11/07 0441) Pulse Rate:  [59-75] 60 (11/07 0441) Resp:  [16-19] 16 (11/06 2121) BP: (114-148)/(43-79) 131/55 mmHg (11/07 0441) SpO2:  [92 %-99 %] 92 % (11/07 0441) Last BM Date: 01/19/14  Intake/Output from previous day: 11/06 0701 - 11/07 0700 In: 1121 [I.V.:1121] Out: 1300 [Urine:1300] Intake/Output this shift: Total I/O In: 120 [P.O.:120] Out: 200 [Urine:200]  General appearance: alert and cooperative GI: soft, PEG in place  Lab Results:   Recent Labs  01/19/14 0455 01/20/14 0305  WBC 4.3 12.7*  HGB 10.0* 10.4*  HCT 32.8* 34.0*  PLT 252 236   BMET  Recent Labs  01/19/14 0455 01/20/14 0305  NA 143 138  K 4.0 3.5*  CL 107 103  CO2 26 22  GLUCOSE 77 114*  BUN 9 10  CREATININE 0.89 0.78  CALCIUM 8.2* 8.1*   PT/INR No results for input(s): LABPROT, INR in the last 72 hours. ABG No results for input(s): PHART, HCO3 in the last 72 hours.  Invalid input(s): PCO2, PO2  Studies/Results: No results found.  Anti-infectives: Anti-infectives    Start     Dose/Rate Route Frequency Ordered Stop   01/19/14 1400  ciprofloxacin (CIPRO) IVPB 400 mg     400 mg200 mL/hr over 60 Minutes Intravenous Every 12 hours 01/19/14 1321        Assessment/Plan: 1. 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 ABL anemia secondary to #1 and chronic anticoagulation on Eliquis  3. Previous CVA  4. History of sigmoid and cecal adenocarcinoma, s/p resections  5. Diastolic HF  6. HTN  7. A. Fib  8. H/o ovarian cancer  9. Tachy-brady syndrome, s/p pacemaker 10. Intermittent nausea/vomiting   Plan: diet as tolerated, vent PEG PRN  LOS: 5 days    Tekla Malachowski E 01/20/2014

## 2014-01-21 LAB — CBC
HCT: 33.9 % — ABNORMAL LOW (ref 36.0–46.0)
Hemoglobin: 10.4 g/dL — ABNORMAL LOW (ref 12.0–15.0)
MCH: 25.4 pg — ABNORMAL LOW (ref 26.0–34.0)
MCHC: 30.7 g/dL (ref 30.0–36.0)
MCV: 82.7 fL (ref 78.0–100.0)
PLATELETS: 229 10*3/uL (ref 150–400)
RBC: 4.1 MIL/uL (ref 3.87–5.11)
RDW: 19.8 % — ABNORMAL HIGH (ref 11.5–15.5)
WBC: 6.4 10*3/uL (ref 4.0–10.5)

## 2014-01-21 NOTE — Evaluation (Signed)
Physical Therapy Evaluation Patient Details Name: [name] MRN: [id number] DOB: 1922-06-27 Today's Date: 01/21/2014   History of Present Illness  Patient is an 80 yo female admitted 01/15/14 with partial SBO-malignant. Patient s/p placement of venting PEG.  Palliative Care following patient.  Patient with h/o metastatic colon CA.  PMH:  CHF, SSS, CA, recurrent falls.  Clinical Impression  Patient presents with problems listed below.  Will benefit from acute PT to improve functional mobility.  Recommend  SNF at discharge for 24 hour assist and PT as appropriate.    Follow Up Recommendations SNF;Supervision/Assistance - 24 hour    Equipment Recommendations  None recommended by PT    Recommendations for Other Services       Precautions / Restrictions Precautions Precautions: Fall Precaution Comments: Venting PEG Restrictions Weight Bearing Restrictions: No      Mobility  Bed Mobility Overal bed mobility: Needs Assistance Bed Mobility: Supine to Sit;Sit to Supine     Supine to sit: Min assist Sit to supine: Min assist   General bed mobility comments: Verbal cues for technique.  Assist to raise trunk to upright position.  Once upright, patient with good sitting balance.  Patient sat EOB x 10 minutes, performing exercises.  Assist to bring LE's onto bed to return to supine.  Transfers Overall transfer level: Needs assistance Equipment used: Quad cane Transfers: Sit to/from Stand Sit to Stand: Max assist         General transfer comment: Verbal cues for hand placement and technique.  Attempted to move to standing x2 with max assist.  Patient able to clear hips from bed, but unable to reach standing position.  Patient reports she is too fatigued to try again.  Ambulation/Gait                Stairs            Wheelchair Mobility    Modified Rankin (Stroke Patients Only)       Balance                                              Pertinent Vitals/Pain Pain Assessment: Faces Faces Pain Scale: Hurts little more Pain Location: Abdomen Pain Descriptors / Indicators: Sore Pain Intervention(s): Limited activity within patient's tolerance;Monitored during session;Repositioned    Home Living Family/patient expects to be discharged to:: Skilled nursing facility [facility]) Living Arrangements: Alone                    Prior Function Level of Independence: Needs assistance   Gait / Transfers Assistance Needed: Pt reports she is mod (I) with bed mobility skills, transfers, and household amb.  Pt reports she primarily uses her quad cane, though does have a rollator to use sometimes.   ADL's / Homemaking Assistance Needed: Assist from caretaker for cooking, cleaning, bathing, dressing.         Hand Dominance   Dominant Hand: Right    Extremity/Trunk Assessment   Upper Extremity Assessment: Generalized weakness           Lower Extremity Assessment: Generalized weakness         Communication   Communication: No difficulties  Cognition Arousal/Alertness: Awake/alert Behavior During Therapy: Anxious Overall Cognitive Status: Within Functional Limits for tasks assessed  General Comments      Exercises General Exercises - Upper Extremity Shoulder Flexion: AROM;Both;5 reps;Seated Elbow Flexion: AROM;Both;5 reps;Seated General Exercises - Lower Extremity Long Arc Quad: AROM;Both;10 reps;Seated Hip ABduction/ADduction: AROM;Both;5 reps;Seated Hip Flexion/Marching: AROM;Both;5 reps;Seated Toe Raises: AROM;Both;10 reps;Seated Heel Raises: AROM;Both;10 reps;Seated      Assessment/Plan    PT Assessment Patient needs continued PT services  PT Diagnosis Difficulty walking;Generalized weakness;Acute pain   PT Problem List Decreased strength;Decreased activity tolerance;Decreased balance;Decreased mobility;Decreased knowledge of use of  DME;Cardiopulmonary status limiting activity;Pain  PT Treatment Interventions DME instruction;Gait training;Functional mobility training;Therapeutic activities;Therapeutic exercise;Patient/family education   PT Goals (Current goals can be found in the Care Plan section) Acute Rehab PT Goals Patient Stated Goal: To get stronger PT Goal Formulation: With patient Time For Goal Achievement: 02/04/14 Potential to Achieve Goals: Fair    Frequency Min 2X/week   Barriers to discharge Decreased caregiver support Patient lives alone.    Co-evaluation               End of Session Equipment Utilized During Treatment: Gait belt Activity Tolerance: Patient limited by fatigue Patient left: in bed;with call bell/phone within reach;with family/visitor present Nurse Communication: Mobility status         Time: 1850-1917 PT Time Calculation (min): 27 min   Charges:   PT Evaluation $Initial PT Evaluation Tier I: 1 Procedure PT Treatments $Therapeutic Activity: 8-22 mins   PT G Codes:          Despina Pole 01/21/2014, 8:04 PM Carita Pian. Sanjuana Kava, [place] Pager [phone]

## 2014-01-21 NOTE — Progress Notes (Signed)
[facility]                                                                              Patient Demographics  [name], is a 80 y.o. female, DOB - 1923/03/04, PRF:[id number]  Admit date - 01/15/2014   Admitting Physician Samuella Cota, MD  Outpatient Primary MD for the patient is [name], MD  LOS - 6   Chief Complaint  Patient presents with  . Emesis      HPI on 01/15/2014  [name] is a 80 y.o. female with a history of heart failure, sick sinus syndrome, GERD, cancer who presented to [facility] with intractable n/v over past 1-2 weeks. Given specific diet and zofran during that time w/o benefit. Becoming more frequent. Daughter spoke to staff by phone who felt pt needed to come to ED. N/v worse w/ any food and even water. Unable keep anything down for several days. 2-3 bouts of emesis per day. Emesis was non-bloody and bilious. Denies diarrhea, fevers. Intermittent URI symptoms.   Assessment & Plan   Partial small bowel obstruction -Patient was sent from [facility] hospital to [facility] -This is been ongoing for several months, likely related to colon cancer status post resection -Patient required NG tube at admission, however this was discontinued  -Continue antiemetics, pain control -Surgery consulted and appreciated and  Venting PEG tube inserted on 01/19/2014 -Will advance as tolerated -Palliative care following and recommended diet beginning with liquids and open PEG to drain with nausea, Roxanol SL upon discharge, PRN dulcolax  Probable metastatic cancer -patient has a history of colon, ovarian, and lung cancer -continue outpatient workup per oncology  Hypertension -Continue hydralazine IV as needed  Atrial fibrillation/Tachy-brady syndrome -Pacemaker in place -Continue diltiazem and sotalol  GERD -Continue PPI  Chronic normocytic anemia -hemoglobin appears to be at baseline approximately 9 -Continue to monitor CBC -Home iron  held  Deconditioning -PT recommended home health (however this was 11/3) -OT did not recommend further therapy -PT and OT to reevaluate patient, pending  Code Status: DO NOT RESUSCITATE  Family Communication: none at bedside.  Spoke with son and daughter via phone.  Disposition Plan: admitted. Pending PT/OT reevaluations and possible placement on 01/22/2014.  Time Spent in minutes   25 minutes  Procedures  Peg tube placement  Consults   General surgery Palliative care  DVT Prophylaxis  heparin  Lab Results  Component Value Date   PLT 229 01/21/2014    Medications  Scheduled Meds: . aspirin EC  81 mg Oral Daily  . ciprofloxacin  400 mg Intravenous Q12H  . diltiazem  180 mg Oral Daily  . feeding supplement (RESOURCE BREEZE)  1 Container Oral TID BM  . heparin  5,000 Units Subcutaneous 3 times per day  . pantoprazole  40 mg Oral BID  . sodium chloride  3 mL Intravenous Q12H  . sotalol  240 mg Oral BID   Continuous Infusions: . lactated ringers 10 mL/hr at 01/21/14 0531   PRN Meds:.[DISCONTINUED] acetaminophen **OR** acetaminophen, acetaminophen, bisacodyl, metoprolol, morphine injection, promethazine  Antibiotics    Anti-infectives    Start     Dose/Rate Route Frequency Ordered  Stop   01/19/14 1400  ciprofloxacin (CIPRO) IVPB 400 mg     400 mg200 mL/hr over 60 Minutes Intravenous Every 12 hours 01/19/14 1321        Subjective:   [name] seen and examined today.   Patient no longer complains of nausea, vomiting, abdominal pain.  She feels that she is ready to eat.  She would like to get out of bed.   Objective:   Filed Vitals:   01/20/14 1334 01/20/14 2047 01/20/14 2301 01/21/14 0709  BP: 141/65 138/57 135/52 145/64  Pulse: 60 61 61 59  Temp: 99.3 F (37.4 C) 98.3 F (36.8 C)  98.4 F (36.9 C)  TempSrc: Oral Oral  Oral  Resp: 18 18  20   Height:      Weight:      SpO2: 93% 94%  92%    Wt Readings from Last 3 Encounters:  01/16/14 83.099 kg  (183 lb 3.2 oz)  12/25/13 86.637 kg (191 lb)  12/13/13 83.915 kg (185 lb)     Intake/Output Summary (Last 24 hours) at 01/21/14 6073 Last data filed at 01/21/14 0636  Gross per 24 hour  Intake 620.83 ml  Output    750 ml  Net -129.17 ml    Exam  General: Well developed, well nourished, NAD, appears stated age  HEENT: NCAT, mucous membranes moist.   Cardiovascular: S1 S2 auscultated, RRR  Respiratory: Clear to auscultation bilaterally with equal chest rise  Abdomen: Soft, nontender, nondistended, + bowel sounds,peg tube placed  Extremities: warm dry without cyanosis clubbing or edema  Neuro: AAOx3, no focal deficit  Psych: appropriate affect and demeanor  Data Review   Micro Results No results found for this or any previous visit (from the past 240 hour(s)).  Radiology Reports Dg Chest 1 View  01/16/2014   CLINICAL DATA:  Weakness, status post nasogastric catheter placement  EXAM: CHEST - 1 VIEW  COMPARISON:  01/15/2014  FINDINGS: Cardiac shadow is stable. A pacing device is again seen. Bilateral lung masses are again noted and stable. A nasogastric catheter has been placed with its tip within the stomach beyond the edge of the film. No bony abnormality is noted.  IMPRESSION: Nasogastric catheter within the stomach.   Electronically Signed   By: Inez Catalina M.D.   On: 01/16/2014 08:45   Dg Chest 2 View  01/15/2014   CLINICAL DATA:  Shortness of Breath.  Colon carcinoma  EXAM: CHEST  2 VIEW  COMPARISON:  December 13, 2013  FINDINGS: There is a mass in the superior segment of the right lower lobe measuring 5.0 x 4.4 x 4.3 cm. There is a mass in the superior segment of the left lower lobe measuring 3.0 x 2.4 by 2.2 cm. There is no edema or consolidation. Heart is mildly enlarged with pulmonary vascularity within normal limits. Pacemaker leads are attached to the right atrium and right ventricle. No adenopathy. No bone lesions.  IMPRESSION: Masses in the superior segment of  each lower lobe. These masses appear larger than on recent prior study. Metastatic disease is felt to be present.  No edema or consolidation.  Stable cardiac enlargement.   Electronically Signed   By: Lowella Grip M.D.   On: 01/15/2014 16:20   Dg Abd 1 View  01/15/2014   CLINICAL DATA:  Nausea vomiting today. Partial small bowel obstruction on an abdomen and pelvis CT earlier today. Nasogastric tube placement.  EXAM: ABDOMEN - 1 VIEW  COMPARISON:  Abdomen and pelvis  CT obtained earlier today.  FINDINGS: Nasogastric tube tip in the region of the gastric pylorus or duodenal bulb. CT oral contrast in normal caliber and dilated small bowel loops. Left pelvic and lower abdominal surgical clips. Lumbar spine degenerative changes and mild scoliosis.  IMPRESSION: Stable pattern of partial small bowel obstruction. Nasogastric tube tip in the gastric pylorus or duodenal bulb.   Electronically Signed   By: Enrique Sack M.D.   On: 01/15/2014 21:15   Ct Abdomen Pelvis W Contrast  01/15/2014   CLINICAL DATA:  Shortness of breath. Abdominal pain. History of recurrent colon cancer on chemotherapy.  EXAM: CT ABDOMEN AND PELVIS WITH CONTRAST  TECHNIQUE: Multidetector CT imaging of the abdomen and pelvis was performed using the standard protocol following bolus administration of intravenous contrast.  CONTRAST:  69mL OMNIPAQUE IOHEXOL 300 MG/ML SOLN, 87mL OMNIPAQUE IOHEXOL 300 MG/ML SOLN  COMPARISON:  CT 11/30/2013  FINDINGS: Previously seen bilateral lower lobe masses are not imaged on the CT abdomen. Heart is borderline in size. Dense coronary artery calcifications. Pacer wires noted in the right heart.  Tiny hypodensity seen in the inferior right hepatic lobe again noted and stable. Spleen, pancreas, adrenals have an unremarkable appearance. Stable small cyst in the midpole of the right kidney. No hydronephrosis.  Changes of right hemicolectomy. Surgical changes also noted in the sigmoid colon. There are dilated small  bowel loops in the abdomen. Distal small bowel loops are decompressed. Findings compatible with small bowel obstruction. Transition is in the right lower quadrant, best seen on image 51 of series 2, where the out flowing small bowel loop appears thick-walled. This is of unknown etiology. This could reflect an area of wall thickening due to inflammation/infection. Tumor cannot be excluded. Stomach is unremarkable.  Aorta and iliac vessels are calcified, non aneurysmal. No free fluid or free air. Prior hysterectomy. No adnexal masses. Urinary bladder is unremarkable.  Decreasing presacral edema and soft tissue thickening, likely improving postsurgical changes. Soft tissue mass in the anterior abdominal wall in the region of the right rectus muscle is again noted measuring 2.9 x 2.1 cm compared with 2.5 x 2.3 cm previously.  Degenerative changes in the lumbar spine. No acute bony abnormality or focal bone lesion.  IMPRESSION: Dilated proximal small bowel loops with transition point in the right lower quadrant an area of circumferential wall thickening within the small bowel. Findings compatible with partial small bowel obstruction. Wall thickening could be related to inflammation/infection, but malignancy cannot be excluded.  Postsurgical changes in the right colon and sigmoid colon.  Anterior abdominal wall nodule again noted, possibly slightly larger.   Electronically Signed   By: Rolm Baptise M.D.   On: 01/15/2014 16:51    CBC  Recent Labs Lab 01/15/14 1307 01/18/14 0549 01/19/14 0455 01/20/14 0305 01/21/14 0605  WBC 7.5 4.9 4.3 12.7* 6.4  HGB 11.7* 9.9* 10.0* 10.4* 10.4*  HCT 38.5 32.9* 32.8* 34.0* 33.9*  PLT 351 252 252 236 229  MCV 84.2 83.9 83.5 83.3 82.7  MCH 25.6* 25.3* 25.4* 25.5* 25.4*  MCHC 30.4 30.1 30.5 30.6 30.7  RDW 19.8* 19.8* 19.6* 19.8* 19.8*  LYMPHSABS 1.2  --   --   --   --   MONOABS 0.9  --   --   --   --   EOSABS 0.1  --   --   --   --   BASOSABS 0.0  --   --   --   --  Chemistries   Recent Labs Lab 01/15/14 1307 01/18/14 0549 01/19/14 0455 01/20/14 0305  NA 140 141 143 138  K 4.6 4.2 4.0 3.5*  CL 101 106 107 103  CO2 29 24 26 22   GLUCOSE 106* 77 77 114*  BUN 21 11 9 10   CREATININE 1.11* 0.93 0.89 0.78  CALCIUM 9.5 8.2* 8.2* 8.1*  AST 19  --   --   --   ALT 9  --   --   --   ALKPHOS 110  --   --   --   BILITOT 0.4  --   --   --    ------------------------------------------------------------------------------------------------------------------ estimated creatinine clearance is 55.5 mL/min (by C-G formula based on Cr of 0.78). ------------------------------------------------------------------------------------------------------------------ No results for input(s): HGBA1C in the last 72 hours. ------------------------------------------------------------------------------------------------------------------ No results for input(s): CHOL, HDL, LDLCALC, TRIG, CHOLHDL, LDLDIRECT in the last 72 hours. ------------------------------------------------------------------------------------------------------------------ No results for input(s): TSH, T4TOTAL, T3FREE, THYROIDAB in the last 72 hours.  Invalid input(s): FREET3 ------------------------------------------------------------------------------------------------------------------ No results for input(s): VITAMINB12, FOLATE, FERRITIN, TIBC, IRON, RETICCTPCT in the last 72 hours.  Coagulation profile No results for input(s): INR, PROTIME in the last 168 hours.  No results for input(s): DDIMER in the last 72 hours.  Cardiac Enzymes No results for input(s): CKMB, TROPONINI, MYOGLOBIN in the last 168 hours.  Invalid input(s): CK ------------------------------------------------------------------------------------------------------------------ Invalid input(s): POCBNP    Mirtha Jain D.O. on 01/21/2014 at 9:09 AM  Between 7am to 7pm - Pager - [phone]  After 7pm go to [email or web address]  - password [email or web address]  And look for the night coverage person covering for me after hours  [facility] Office  [phone]

## 2014-01-21 NOTE — Clinical Social Work Placement (Signed)
Clinical Social Work Department CLINICAL SOCIAL WORK PLACEMENT NOTE 01/21/2014  Patient:  [name], [name]  Account Number:  [id number] Admit date:  01/15/2014  Clinical Social Worker:  Carrington Clamp, LCSWA  Date/time:  01/21/2014 08:44 AM  Clinical Social Work is seeking post-discharge placement for this patient at the following level of care:   [place]   (*CSW will update this form in Epic as items are completed)   01/20/2014  Patient/family provided with [place] Department of Clinical Social Work's list of facilities offering this level of care within the geographic area requested by the patient (or if unable, by the patient's family).  01/20/2014  Patient/family informed of their freedom to choose among providers that offer the needed level of care, that participate in Medicare, Medicaid or managed care program needed by the patient, have an available bed and are willing to accept the patient.  01/20/2014  Patient/family informed of [facility]' ownership interest in [facility], as well as of the fact that they are under no obligation to receive care at this facility.  PASARR submitted to EDS on Existing PASARR number received on Existing  FL2 transmitted to all facilities in geographic area requested by pt/family on  01/21/2014 FL2 transmitted to all facilities within larger geographic area on   Patient informed that his/her managed care company has contracts with or will negotiate with  certain facilities, including the following:     Patient/family informed of bed offers received:   Patient chooses bed at  Physician recommends and patient chooses bed at    Patient to be transferred to  on   Patient to be transferred to facility by  Patient and family notified of transfer on  Name of family member notified:    The following physician request were entered in Epic:   Additional Comments:  [place], [place] Weekend Clinical  Social Worker [phone]

## 2014-01-21 NOTE — Plan of Care (Signed)
Problem: Discharge Progression Outcomes Goal: Tolerating diet Outcome: Progressing

## 2014-01-22 ENCOUNTER — Other Ambulatory Visit: Payer: Self-pay | Admitting: *Deleted

## 2014-01-22 ENCOUNTER — Inpatient Hospital Stay
Admission: RE | Admit: 2014-01-22 | Discharge: 2014-01-30 | Disposition: A | Discharge status: Hospice | Payer: Medicare Other | Source: Ambulatory Visit | Attending: Internal Medicine | Admitting: Internal Medicine

## 2014-01-22 ENCOUNTER — Encounter (HOSPITAL_COMMUNITY): Payer: Self-pay | Admitting: General Surgery

## 2014-01-22 DIAGNOSIS — D500 Iron deficiency anemia secondary to blood loss (chronic): Secondary | ICD-10-CM

## 2014-01-22 DIAGNOSIS — R112 Nausea with vomiting, unspecified: Secondary | ICD-10-CM | POA: Insufficient documentation

## 2014-01-22 DIAGNOSIS — R1110 Vomiting, unspecified: Secondary | ICD-10-CM

## 2014-01-22 DIAGNOSIS — Z4659 Encounter for fitting and adjustment of other gastrointestinal appliance and device: Secondary | ICD-10-CM | POA: Insufficient documentation

## 2014-01-22 MED ORDER — BISACODYL 10 MG RE SUPP: 10.0000 mg | Freq: Every day | RECTAL | Status: AC | PRN

## 2014-01-22 MED ORDER — MORPHINE SULFATE (CONCENTRATE) 20 MG/ML PO SOLN: ORAL | Status: AC

## 2014-01-22 MED ORDER — DOCUSATE SODIUM 100 MG PO CAPS
100.0000 mg | ORAL_CAPSULE | Freq: Every day | ORAL | Status: DC
Start: 2014-01-22 — End: 2014-01-22

## 2014-01-22 MED ORDER — BOOST / RESOURCE BREEZE PO LIQD: 1.0000 | Freq: Three times a day (TID) | ORAL | Status: AC

## 2014-01-22 MED ORDER — PANTOPRAZOLE SODIUM 40 MG PO TBEC: 40.0000 mg | DELAYED_RELEASE_TABLET | Freq: Two times a day (BID) | ORAL | Status: AC

## 2014-01-22 MED ORDER — PROMETHAZINE HCL 12.5 MG PO TABS: 12.5000 mg | ORAL_TABLET | Freq: Four times a day (QID) | ORAL | Status: AC | PRN

## 2014-01-22 MED ORDER — MORPHINE SULFATE (CONCENTRATE) 10 MG /0.5 ML PO SOLN: 10.0000 mg | ORAL | Status: DC | PRN

## 2014-01-22 MED ORDER — MORPHINE SULFATE (CONCENTRATE) 10 MG/0.5ML PO SOLN: 5.0000 mg | ORAL | Status: DC | PRN

## 2014-01-22 MED ORDER — MORPHINE SULFATE (CONCENTRATE) 10 MG /0.5 ML PO SOLN: 10.0000 mg | ORAL | Status: AC | PRN

## 2014-01-22 MED ORDER — SENNOSIDES-DOCUSATE SODIUM 8.6-50 MG PO TABS: 2.0000 | ORAL_TABLET | Freq: Every day | ORAL | Status: DC

## 2014-01-22 NOTE — Progress Notes (Addendum)
Pt. Transferred to [facility]. Report called into Tabitha, LPN.

## 2014-01-22 NOTE — Clinical Social Work Placement (Signed)
Clinical Social Work Department CLINICAL SOCIAL WORK PLACEMENT NOTE 01/21/2014  Patient: [name], [name] Account Number: [id number] Admit date: 01/15/2014  Clinical Social Worker: Carrington Clamp, LCSWA Date/time: 01/21/2014 08:44 AM  Clinical Social Work is seeking post-discharge placement for this patient at the following level of care: [place] (*CSW will update this form in Epic as items are completed)   01/20/2014 Patient/family provided with [place] Department of Clinical Social Work's list of facilities offering this level of care within the geographic area requested by the patient (or if unable, by the patient's family).  01/20/2014 Patient/family informed of their freedom to choose among providers that offer the needed level of care, that participate in Medicare, Medicaid or managed care program needed by the patient, have an available bed and are willing to accept the patient.  01/20/2014 Patient/family informed of [facility]' ownership interest in [facility], as well as of the fact that they are under no obligation to receive care at this facility.  PASARR submitted to EDS on Existing PASARR number received on Existing  FL2 transmitted to all facilities in geographic area requested by pt/family on 01/21/2014 FL2 transmitted to all facilities within larger geographic area on   Patient informed that his/her managed care company has contracts with or will negotiate with certain facilities, including the following:    Patient/family informed of bed offers received:01/22/14  Patient chooses bed at [place] recommends and patient chooses bed at   Patient to be transferred to [facility] on 01/22/14 Patient to be transferred to facility by Ambulance Patient and family notified of transfer on 01/22/14 Name of family member notified: Comptroller (daughter)  The following physician request were entered in  Epic:   Additional Comments:  Per MD patient ready for DC to [facility]. RN, patient, patient's family, and facility notified of DC. RN given number for report. DC packet on chart. [facility]bul[facility] transport requested for patient. CSW signing off.   Narcissa, [place] Weekend Clinical Social Worker [phone]

## 2014-01-22 NOTE — Progress Notes (Signed)
Progress Note from the Palliative Medicine Team at [facility]: [name] says she has not had any nausea since yesterday morning and says she ate all of her grits this morning and a cup of coffee without any nausea. Encouraged her to let her nurse know (here and at [facility]) at the first signs of any discomfort, fullness, or nausea so we can drain that PEG for relief. She says that she will. Discussed that we can also place to bag for continuous gravity drainage if needed with worsening symptoms/obstruction. She complains of no further pain or discomfort. Plan is for [facility] today with hospice to follow.    Objective: Allergies  Allergen Reactions  . Amiodarone Other (See Comments)    pacemaker  . Diovan [Valsartan] Other (See Comments)  . Lasix [Furosemide] Swelling    Tolerate Bumex  . Penicillins Nausea And Vomiting and Swelling    Lip swelling and redness  . Sulfa Antibiotics Rash   Scheduled Meds: . aspirin EC  81 mg Oral Daily  . diltiazem  180 mg Oral Daily  . feeding supplement (RESOURCE BREEZE)  1 Container Oral TID BM  . heparin  5,000 Units Subcutaneous 3 times per day  . pantoprazole  40 mg Oral BID  . sodium chloride  3 mL Intravenous Q12H  . sotalol  240 mg Oral BID   Continuous Infusions: . lactated ringers 10 mL/hr at 01/21/14 0531   PRN Meds:.[DISCONTINUED] acetaminophen **OR** acetaminophen, acetaminophen, bisacodyl, metoprolol, morphine injection, promethazine  BP 125/61 mmHg  Pulse 64  Temp(Src) 97.8 F (36.6 C) (Oral)  Resp 19  Ht 5' 9.5" (1.765 m)  Wt 83.099 kg (183 lb 3.2 oz)  BMI 26.68 kg/m2  SpO2 95%   PPS: 30%   Intake/Output Summary (Last 24 hours) at 01/22/14 1226 Last data filed at 01/22/14 0950  Gross per 24 hour  Intake    480 ml  Output    630 ml  Net   -150 ml      LBM: 11/6    Stool Softner: yes  Physical Exam:  General: NAD, pleasant HEENT: [place]/AT, no JVD, moist mucous membranes Chest: No labored  breathing, symmetric CVS: Irregular Abdomen: Soft, NT, distended, PEG in place clamped and site clean/dry Ext: MAE, no edema, warm to touch Neuro: Awake, alert, oriented x 3, follows commands  Labs: CBC    Component Value Date/Time   WBC 6.4 01/21/2014 0605   WBC 7.0 12/25/2013 1035   WBC 6.6 11/24/2013 1428   WBC 6.1 12/26/2008 0943   RBC 4.10 01/21/2014 0605   RBC 4.11 12/25/2013 1035   RBC 3.3* 11/24/2013 1428   RBC 3.63* 02/29/2012 0443   RBC 4.25 12/26/2008 0943   HGB 10.4* 01/21/2014 0605   HGB 10.3* 01/02/2014 1640   HGB 13.3 12/26/2008 0943   HCT 33.9* 01/21/2014 0605   HCT 25.0* 11/24/2013 1428   HCT 38.8 12/26/2008 0943   PLT 229 01/21/2014 0605   PLT 196 12/26/2008 0943   MCV 82.7 01/21/2014 0605   MCV 76.3* 11/24/2013 1428   MCV 91.3 12/26/2008 0943   MCH 25.4* 01/21/2014 0605   MCH 24.6* 12/25/2013 1035   MCH 23.6* 11/24/2013 1428   MCH 31.3 12/26/2008 0943   MCHC 30.7 01/21/2014 0605   MCHC 30.3* 12/25/2013 1035   MCHC 31.0* 11/24/2013 1428   MCHC 34.3 12/26/2008 0943   RDW 19.8* 01/21/2014 0605   RDW 19.8* 12/25/2013 1035   RDW 13.4 12/26/2008 0943  LYMPHSABS 1.2 01/15/2014 1307   LYMPHSABS 0.7 12/25/2013 1035   LYMPHSABS 1.0 12/26/2008 0943   MONOABS 0.9 01/15/2014 1307   MONOABS 0.5 12/26/2008 0943   EOSABS 0.1 01/15/2014 1307   EOSABS 0.1 12/25/2013 1035   BASOSABS 0.0 01/15/2014 1307   BASOSABS 0.0 12/25/2013 1035   BASOSABS 0.0 12/26/2008 0943    BMET    Component Value Date/Time   NA 138 01/20/2014 0305   NA 142 01/02/2014 1635   K 3.5* 01/20/2014 0305   CL 103 01/20/2014 0305   CO2 22 01/20/2014 0305   GLUCOSE 114* 01/20/2014 0305   GLUCOSE 121* 01/02/2014 1635   BUN 10 01/20/2014 0305   BUN 14 01/02/2014 1635   CREATININE 0.78 01/20/2014 0305   CALCIUM 8.1* 01/20/2014 0305   GFRNONAA 72* 01/20/2014 0305   GFRAA 83* 01/20/2014 0305    CMP     Component Value Date/Time   NA 138 01/20/2014 0305   NA 142 01/02/2014 1635    K 3.5* 01/20/2014 0305   CL 103 01/20/2014 0305   CO2 22 01/20/2014 0305   GLUCOSE 114* 01/20/2014 0305   GLUCOSE 121* 01/02/2014 1635   BUN 10 01/20/2014 0305   BUN 14 01/02/2014 1635   CREATININE 0.78 01/20/2014 0305   CALCIUM 8.1* 01/20/2014 0305   PROT 7.6 01/15/2014 1307   PROT 6.3 12/25/2013 1035   ALBUMIN 3.2* 01/15/2014 1307   AST 19 01/15/2014 1307   ALT 9 01/15/2014 1307   ALKPHOS 110 01/15/2014 1307   BILITOT 0.4 01/15/2014 1307   GFRNONAA 72* 01/20/2014 0305   GFRAA 83* 01/20/2014 0305    Assessment and Plan: 1. Code Status: DNR 2. Symptom Control: Anxiety/Agitation: Lorazepam 0.5-1 mg qhs prn.  1. Pain: Roxanol 5 mg every 2 hours prn.  2. Bowel Regimen: Dulcolax supp daily prn. Colace daily. Senokot 2 tablets qhs.  3. Fever: Acetaminophen prn.  4. Nausea/Vomiting: Drain venting PEG as needed with fullness/nausea -  May connect to drainage bag if needed to manage symptoms. Phenergan 12.5 mg every 6 hours prn. Continue protonix scheduled.  3. Psycho/Social: Emotional support provided to patient and family.  4. Disposition: SNF with hospice today.     Time In Time Out Total Time Spent with Patient Total Overall Time  1100 1120 31min 49min    Greater than 50%  of this time was spent counseling and coordinating care related to the above assessment and plan.  Vinie Sill, NP Palliative Medicine Team Pager # [phone] (M-F 8a-5p) Team Phone # [phone] (Nights/Weekends)   1

## 2014-01-22 NOTE — Telephone Encounter (Signed)
Faxed to [facility]. [phone]

## 2014-01-22 NOTE — Progress Notes (Signed)
OT Cancellation Note  Patient Details Name: [name] MRN: [id number] DOB: 1922/09/25   Cancelled Treatment:    Reason Eval/Treat Not Completed: Pt is Medicare and current D/C plan is SNF due to pt with decline in functional status since original eval on 11/4 . No apparent immediate acute care OT needs, therefore will defer OT to SNF. If OT eval is needed please call Acute Rehab Dept. at [phone] or text page OT at [phone].    Almon Register [phone] 01/22/2014, 8:35 AM

## 2014-01-22 NOTE — Progress Notes (Signed)
CARE MANAGEMENT NOTE 01/22/2014  Patient:  [name], [name]   Account Number:  [id number]  Date Initiated:  01/16/2014  Documentation initiated by:  Theophilus Kinds  Subjective/Objective Assessment:   Pt admitted from home with partial SBO. Pt lives alone and has a CAP aide 3 hours a day, M-F. Pt also has a sitter that spends the night. Pt has a cane and walker for home use. Family is interested in placement. Pt also has [facility].     Action/Plan:   PT is recommending HH PT. CM explained to pt and family that Medicare will not cover SNF at this time. Will continue to follow for discharge planning needs.   Anticipated DC Date:  01/19/2014   Anticipated DC Plan:  [place]  CM consult      [facility] Choice  Resumption Of Svcs/PTA Provider   Choice offered to / List presented to:             Status of service:  Completed, signed off Medicare Important Message given?  YES (If response is "NO", the following Medicare IM given date fields will be blank) Date Medicare IM given:  01/19/2014 Medicare IM given by:  Tomi Bamberger Date Additional Medicare IM given:  01/22/2014 Additional Medicare IM given by:  Tomi Bamberger  Discharge Disposition:  [place]  Per UR Regulation:  Reviewed for med. necessity/level of care/duration of stay  If discussed at [place] of Stay Meetings, dates discussed:    Comments:  01/22/2014 dc to SNF. Jonnie Finner RN CCM Case Mgmt phone [phone]  01/16/14 [place], [place] BSN CM  01/19/14 1130 Tomi Bamberger RN, BSN  [phone] patient has cap  services and she is active with [facility] and Pt, will resume.

## 2014-01-22 NOTE — Progress Notes (Signed)
Patient ID: [name], female   DOB: [birth year], 80 y.o.   MRN: [id number] 3 Days Post-Op  Subjective: Pt feels ok.  No nausea right now with g-tube clamped  Objective: Vital signs in last 24 hours: Temp:  [97.8 F (36.6 C)-99 F (37.2 C)] 97.8 F (36.6 C) (11/09 0532) Pulse Rate:  [60-64] 64 (11/09 0532) Resp:  [19-20] 19 (11/09 0532) BP: (125-133)/(61-71) 125/61 mmHg (11/09 0532) SpO2:  [95 %] 95 % (11/09 0532) Last BM Date: 01/19/14  Intake/Output from previous day: 11/08 0701 - 11/09 0700 In: 240 [P.O.:240] Out: 530 [Urine:530] Intake/Output this shift: Total I/O In: 240 [P.O.:240] Out: 100 [Urine:100]  PE: Abd: soft, PEG in place and clamped off.  No drainage or erythema at insertion site  Lab Results:   Recent Labs  01/20/14 0305 01/21/14 0605  WBC 12.7* 6.4  HGB 10.4* 10.4*  HCT 34.0* 33.9*  PLT 236 229   BMET  Recent Labs  01/20/14 0305  NA 138  K 3.5*  CL 103  CO2 22  GLUCOSE 114*  BUN 10  CREATININE 0.78  CALCIUM 8.1*   PT/INR No results for input(s): LABPROT, INR in the last 72 hours. CMP     Component Value Date/Time   NA 138 01/20/2014 0305   NA 142 01/02/2014 1635   K 3.5* 01/20/2014 0305   CL 103 01/20/2014 0305   CO2 22 01/20/2014 0305   GLUCOSE 114* 01/20/2014 0305   GLUCOSE 121* 01/02/2014 1635   BUN 10 01/20/2014 0305   BUN 14 01/02/2014 1635   CREATININE 0.78 01/20/2014 0305   CALCIUM 8.1* 01/20/2014 0305   PROT 7.6 01/15/2014 1307   PROT 6.3 12/25/2013 1035   ALBUMIN 3.2* 01/15/2014 1307   AST 19 01/15/2014 1307   ALT 9 01/15/2014 1307   ALKPHOS 110 01/15/2014 1307   BILITOT 0.4 01/15/2014 1307   GFRNONAA 72* 01/20/2014 0305   GFRAA 83* 01/20/2014 0305   Lipase     Component Value Date/Time   LIPASE 28 01/15/2014 1307       Studies/Results: No results found.  Anti-infectives: Anti-infectives    Start     Dose/Rate Route Frequency Ordered Stop   01/19/14 1400  ciprofloxacin (CIPRO) IVPB 400 mg  Status:   Discontinued     400 mg200 mL/hr over 60 Minutes Intravenous Every 12 hours 01/19/14 1321 01/21/14 2114       Assessment/Plan  1. Stage 4 met colon ca 2. SBO secondary to #1 3. S/p PEG placement for palliative venting  Plan: 1. PEG site looks good.  Patient going to [facility] center today with hospice.  She may follow up with [place] only as needed.   LOS: 7 days    Dwane Andres E 01/22/2014, 11:14 AM Pager: [phone]

## 2014-01-22 NOTE — Plan of Care (Signed)
Problem: Discharge Progression Outcomes Goal: Tolerating diet Outcome: Progressing

## 2014-01-22 NOTE — Discharge Summary (Addendum)
Physician Discharge Summary  [name] UKG:[id number] DOB: 1922-08-29 DOA: 01/15/2014  PCP: [name], MD  Admit date: 01/15/2014 Discharge date: 01/22/2014  Time spent: 45 minutes  Recommendations for Outpatient Follow-up:  Patient will be discharged to [facility], full Hospice care.  Patient will need to be followed by palliative care at [facility].  She should follow up with primary care physician within 1-2 weeks.  She should also follow up with her oncologist.  Patient should continue a full liquid diet and advance to a soft diet/heart healthy.  Patient to continue taking her medications as prescribed.  Discharge Diagnoses:  Partial small bowel obstruction Probable metastatic cancer Hypertension Atrial fibrillation/tachy-brady syndrome GERD Chronic normocytic anemia Deconditioning  Discharge Condition: stable  Diet recommendation: Full/liquid to soft heart healthy  Filed Weights   01/15/14 1253 01/15/14 1841 01/16/14 2214  Weight: 83.915 kg (185 lb) 83.915 kg (185 lb) 83.099 kg (183 lb 3.2 oz)    History of present illness:  on 01/15/2014  [name] is a 80 y.o. female with a history of heart failure, sick sinus syndrome, GERD, cancer who presented to [facility] with intractable n/v over past 1-2 weeks. Given specific diet and zofran during that time w/o benefit. Becoming more frequent. Daughter spoke to staff by phone who felt pt needed to come to ED. N/v worse w/ any food and even water. Unable keep anything down for several days. 2-3 bouts of emesis per day. Emesis was non-bloody and bilious. Denies diarrhea, fevers. Intermittent URI symptoms.   Hospital Course:  Partial small bowel obstruction -Patient was sent from [facility] hospital to [facility] -This is been ongoing for several months, likely related to colon cancer status post resection -Patient required NG tube at admission, however this was discontinued  -Continue antiemetics, pain  control -Surgery consulted and appreciated and Venting PEG tube inserted on 01/19/2014 -Palliative care following and recommended diet beginning with liquids and open PEG to drain with nausea, Roxanol SL upon discharge, PRN dulcolax  Probable metastatic cancer -patient has a history of colon, ovarian, and lung cancer -continue outpatient workup per oncology  Hypertension -Continue hydralazine IV as needed  Atrial fibrillation/Tachy-brady syndrome -Pacemaker in place -Continue diltiazem and sotalol  GERD -Continue PPI  Chronic normocytic anemia -hemoglobin appears to be at baseline approximately 9 -Continue to monitor CBC -Home iron held but may continue at discharge.  Deconditioning -PT recommended home health (however this was 11/3) -OT did not recommend further therapy -PT reevaluated patient and recommended SNF  Procedures: PEG tube placement  Consultations: General surgery Palliative care  Discharge Exam: Filed Vitals:   01/22/14 0532  BP: 125/61  Pulse: 64  Temp: 97.8 F (36.6 C)  Resp: 19     General: Well developed, well nourished, NAD, appears stated age  HEENT: NCAT, mucous membranes moist.  Cardiovascular: S1 S2 auscultated, RRR  Respiratory: Clear to auscultation bilaterally with equal chest rise  Abdomen: Soft, nontender, nondistended, + bowel sounds, peg tube in place  Extremities: warm dry without cyanosis clubbing or edema  Neuro: AAOx3, no focal deficits  Skin: Without rashes exudates or nodules  Psych: Appropriate mood and affect  Discharge Instructions      Discharge Instructions    Discharge instructions    Complete by:  As directed   Patient will be discharged to [facility], full Hospice care.  Patient will need to be followed by palliative care at [facility].  She should follow up with primary care  physician within 1-2 weeks.  She should also follow up with her oncologist.  Patient should continue a full liquid diet and  advance to a soft diet/heart healthy.  Patient to continue taking her medications as prescribed.            Medication List    TAKE these medications        aspirin EC 81 MG tablet  Take 1 tablet (81 mg total) by mouth daily.     bisacodyl 10 MG suppository  Commonly known as:  DULCOLAX  Place 1 suppository (10 mg total) rectally daily as needed for moderate constipation.     calcium-vitamin D 500-200 MG-UNIT per tablet  Commonly known as:  OSCAL WITH D  Take 1 tablet by mouth 2 (two) times daily.     diltiazem 180 MG 24 hr capsule  Commonly known as:  CARDIZEM CD  Take 180 mg by mouth daily.     feeding supplement (RESOURCE BREEZE) Liqd  Take 1 Container by mouth 3 (three) times daily between meals.     ferrous sulfate 325 (65 FE) MG tablet  Take 1 tablet (325 mg total) by mouth 3 (three) times daily with meals.     magnesium hydroxide 400 MG/5ML suspension  Commonly known as:  MILK OF MAGNESIA  Take 30 mLs by mouth at bedtime.     morphine CONCENTRATE 10 mg / 0.5 ml concentrated solution  Place 0.5 mLs (10 mg total) under the tongue every 4 (four) hours as needed for severe pain.     ondansetron 4 MG tablet  Commonly known as:  ZOFRAN  Take 1 tablet (4 mg total) by mouth every 8 (eight) hours as needed for nausea or vomiting.     pantoprazole 40 MG tablet  Commonly known as:  PROTONIX  Take 1 tablet (40 mg total) by mouth 2 (two) times daily.     promethazine 12.5 MG tablet  Commonly known as:  PHENERGAN  Take 1 tablet (12.5 mg total) by mouth every 6 (six) hours as needed for nausea.     sotalol 120 MG tablet  Commonly known as:  BETAPACE  Take 240 mg by mouth 2 (two) times daily.       Allergies  Allergen Reactions  . Amiodarone Other (See Comments)    pacemaker  . Diovan [Valsartan] Other (See Comments)  . Lasix [Furosemide] Swelling    Tolerate Bumex  . Penicillins Nausea And Vomiting and Swelling    Lip swelling and redness  . Sulfa Antibiotics  Rash   Follow-up Information    Follow up with [name], MD. Schedule an appointment as soon as possible for a visit in 1 week.   Specialty:  Family Medicine   Why:  Hospital followup   Contact information:   [place] [address] [phone]        The results of significant diagnostics from this hospitalization (including imaging, microbiology, ancillary and laboratory) are listed below for reference.    Significant Diagnostic Studies: Dg Chest 1 View  01/16/2014   CLINICAL DATA:  Weakness, status post nasogastric catheter placement  EXAM: CHEST - 1 VIEW  COMPARISON:  01/15/2014  FINDINGS: Cardiac shadow is stable. A pacing device is again seen. Bilateral lung masses are again noted and stable. A nasogastric catheter has been placed with its tip within the stomach beyond the edge of the film. No bony abnormality is noted.  IMPRESSION: Nasogastric catheter within the stomach.   Electronically  Signed   By: Inez Catalina M.D.   On: 01/16/2014 08:45   Dg Chest 2 View  01/15/2014   CLINICAL DATA:  Shortness of Breath.  Colon carcinoma  EXAM: CHEST  2 VIEW  COMPARISON:  December 13, 2013  FINDINGS: There is a mass in the superior segment of the right lower lobe measuring 5.0 x 4.4 x 4.3 cm. There is a mass in the superior segment of the left lower lobe measuring 3.0 x 2.4 by 2.2 cm. There is no edema or consolidation. Heart is mildly enlarged with pulmonary vascularity within normal limits. Pacemaker leads are attached to the right atrium and right ventricle. No adenopathy. No bone lesions.  IMPRESSION: Masses in the superior segment of each lower lobe. These masses appear larger than on recent prior study. Metastatic disease is felt to be present.  No edema or consolidation.  Stable cardiac enlargement.   Electronically Signed   By: Lowella Grip M.D.   On: 01/15/2014 16:20   Dg Abd 1 View  01/15/2014   CLINICAL DATA:  Nausea vomiting today. Partial small bowel  obstruction on an abdomen and pelvis CT earlier today. Nasogastric tube placement.  EXAM: ABDOMEN - 1 VIEW  COMPARISON:  Abdomen and pelvis CT obtained earlier today.  FINDINGS: Nasogastric tube tip in the region of the gastric pylorus or duodenal bulb. CT oral contrast in normal caliber and dilated small bowel loops. Left pelvic and lower abdominal surgical clips. Lumbar spine degenerative changes and mild scoliosis.  IMPRESSION: Stable pattern of partial small bowel obstruction. Nasogastric tube tip in the gastric pylorus or duodenal bulb.   Electronically Signed   By: Enrique Sack M.D.   On: 01/15/2014 21:15   Ct Abdomen Pelvis W Contrast  01/15/2014   CLINICAL DATA:  Shortness of breath. Abdominal pain. History of recurrent colon cancer on chemotherapy.  EXAM: CT ABDOMEN AND PELVIS WITH CONTRAST  TECHNIQUE: Multidetector CT imaging of the abdomen and pelvis was performed using the standard protocol following bolus administration of intravenous contrast.  CONTRAST:  41mL OMNIPAQUE IOHEXOL 300 MG/ML SOLN, 41mL OMNIPAQUE IOHEXOL 300 MG/ML SOLN  COMPARISON:  CT 11/30/2013  FINDINGS: Previously seen bilateral lower lobe masses are not imaged on the CT abdomen. Heart is borderline in size. Dense coronary artery calcifications. Pacer wires noted in the right heart.  Tiny hypodensity seen in the inferior right hepatic lobe again noted and stable. Spleen, pancreas, adrenals have an unremarkable appearance. Stable small cyst in the midpole of the right kidney. No hydronephrosis.  Changes of right hemicolectomy. Surgical changes also noted in the sigmoid colon. There are dilated small bowel loops in the abdomen. Distal small bowel loops are decompressed. Findings compatible with small bowel obstruction. Transition is in the right lower quadrant, best seen on image 51 of series 2, where the out flowing small bowel loop appears thick-walled. This is of unknown etiology. This could reflect an area of wall thickening due to  inflammation/infection. Tumor cannot be excluded. Stomach is unremarkable.  Aorta and iliac vessels are calcified, non aneurysmal. No free fluid or free air. Prior hysterectomy. No adnexal masses. Urinary bladder is unremarkable.  Decreasing presacral edema and soft tissue thickening, likely improving postsurgical changes. Soft tissue mass in the anterior abdominal wall in the region of the right rectus muscle is again noted measuring 2.9 x 2.1 cm compared with 2.5 x 2.3 cm previously.  Degenerative changes in the lumbar spine. No acute bony abnormality or focal bone lesion.  IMPRESSION: Dilated  proximal small bowel loops with transition point in the right lower quadrant an area of circumferential wall thickening within the small bowel. Findings compatible with partial small bowel obstruction. Wall thickening could be related to inflammation/infection, but malignancy cannot be excluded.  Postsurgical changes in the right colon and sigmoid colon.  Anterior abdominal wall nodule again noted, possibly slightly larger.   Electronically Signed   By: Rolm Baptise M.D.   On: 01/15/2014 16:51    Microbiology: No results found for this or any previous visit (from the past 240 hour(s)).   Labs: Basic Metabolic Panel:  Recent Labs Lab 01/15/14 1307 01/18/14 0549 01/19/14 0455 01/20/14 0305  NA 140 141 143 138  K 4.6 4.2 4.0 3.5*  CL 101 106 107 103  CO2 29 24 26 22   GLUCOSE 106* 77 77 114*  BUN 21 11 9 10   CREATININE 1.11* 0.93 0.89 0.78  CALCIUM 9.5 8.2* 8.2* 8.1*   Liver Function Tests:  Recent Labs Lab 01/15/14 1307  AST 19  ALT 9  ALKPHOS 110  BILITOT 0.4  PROT 7.6  ALBUMIN 3.2*    Recent Labs Lab 01/15/14 1307  LIPASE 28   No results for input(s): AMMONIA in the last 168 hours. CBC:  Recent Labs Lab 01/15/14 1307 01/18/14 0549 01/19/14 0455 01/20/14 0305 01/21/14 0605  WBC 7.5 4.9 4.3 12.7* 6.4  NEUTROABS 5.3  --   --   --   --   HGB 11.7* 9.9* 10.0* 10.4* 10.4*  HCT  38.5 32.9* 32.8* 34.0* 33.9*  MCV 84.2 83.9 83.5 83.3 82.7  PLT 351 252 252 236 229   Cardiac Enzymes: No results for input(s): CKTOTAL, CKMB, CKMBINDEX, TROPONINI in the last 168 hours. BNP: BNP (last 3 results) No results for input(s): PROBNP in the last 8760 hours. CBG: No results for input(s): GLUCAP in the last 168 hours.     SignedTASNEEM, CORMIER  [facility] 01/22/2014, 11:09 AM

## 2014-01-22 NOTE — Plan of Care (Signed)
Problem: Discharge Progression Outcomes Goal: Activity appropriate for discharge plan Outcome: Completed/Met Date Met:  01/22/14

## 2014-01-24 ENCOUNTER — Non-Acute Institutional Stay (SKILLED_NURSING_FACILITY): Payer: Medicare Other | Admitting: Internal Medicine

## 2014-01-24 DIAGNOSIS — I482 Chronic atrial fibrillation, unspecified: Secondary | ICD-10-CM

## 2014-01-24 DIAGNOSIS — D62 Acute posthemorrhagic anemia: Secondary | ICD-10-CM

## 2014-01-24 DIAGNOSIS — C189 Malignant neoplasm of colon, unspecified: Secondary | ICD-10-CM

## 2014-01-24 DIAGNOSIS — K56600 Partial intestinal obstruction, unspecified as to cause: Secondary | ICD-10-CM

## 2014-01-24 DIAGNOSIS — K5669 Other intestinal obstruction: Secondary | ICD-10-CM

## 2014-01-26 ENCOUNTER — Non-Acute Institutional Stay (SKILLED_NURSING_FACILITY): Payer: Medicare Other | Admitting: Internal Medicine

## 2014-01-26 ENCOUNTER — Encounter: Payer: Self-pay | Admitting: Internal Medicine

## 2014-01-26 DIAGNOSIS — C187 Malignant neoplasm of sigmoid colon: Secondary | ICD-10-CM

## 2014-01-26 DIAGNOSIS — R110 Nausea: Secondary | ICD-10-CM

## 2014-01-26 NOTE — Progress Notes (Addendum)
Patient ID: [name], female   DOB: [birth year], 80 y.o.   MRN: [id number]               HISTORY & PHYSICAL  DATE:  01/24/2014    FACILITY: [facility]    LEVEL OF CARE:   SNF   CHIEF COMPLAINT:  Admission to SNF, post stay at [facility], 01/15/2014 through 01/22/2014.    HISTORY OF PRESENT ILLNESS:  This is a patient who was admitted with intractable nausea and vomiting.  She had a previous history of a right hemicolectomy, I believe in late 2013, for colon cancer, T4 N1a.    Postoperatively, she suffered a stroke from which she made a decent recovery.  I think she actually was in the building at that time.    I believe she had some bleeding in September of this year.  She had an endoscopy by Dr. Watt Climes that was negative.  Unfortunately, this showed a friable mass adjacent to the ileocolonic anastomosis.  This was non-obstructing; however, occupied a third of the circumference of the colon in that location.  Surgical Pathology documented adenocarcinoma.  I do not believe that she actually had any further surgery or treatment at that point.    She was seen in late October by her primary care physician, who is at [facility], for nausea and vomiting.  Unfortunately, she did not recover.  She was admitted to hospital on this occasion.    She had a partial small bowel obstruction with circumferential wall-thickening within the small bowel.    The patient required an NG tube on admission.  This was removed.  She was seen by Surgery and a venting PEG tube was inserted on 01/19/2014.  The patient saw Palliative Care and recommended a diet beginning with liquids, Roxanol on discharge.    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   Recommendations for Hospice care.  She has a palliative venting PEG tube.    Probable metastatic cancer.    History of prior ovarian and lung cancer, although  I do not have any information on this.    Hypertension.  Was on IV hydralazine p.r.n. in the hospital.  That will not be able to be continued here.    Atrial fibrillation.  On diltiazem and Sotalol with a history of tachybrady syndrome.    Gastroesophageal reflux.    Chronic anemia with a baseline hemoglobin of 9.    CURRENT MEDICATIONS:  Discharge medications include:     ASA 81 q.d.    Dulcolax 10 q.d.    Os-Cal with D 500/200 twice daily.     Diltiazem 180 q.d.    Ferrous sulfate 325 three times daily.    Magnesium hydroxide, Milk of Magnesia, 30 mL at bedtime.    Roxanol 10 mg q.4.    Zofran 4 mg every 8 hours p.r.n.     Protonix 40 b.i.d.    Phenergan 12.5 q.6 p.r.n.    Sotalol 40 b.i.d.    SOCIAL HISTORY:   HOUSING:  The patient lives in [place].   From what I understand, she is going to remain in the building permanently at this point.     CODE STATUS:  She is No Code.   OTHER:  Hospice has not been involved, although this will need to be discussed.  I would like to write No Hospitalization orders.  I believe that the cancer  recurrence was treated palliatively until this presentation, although I may not have full details here.     REVIEW OF SYSTEMS:   GENERAL:  The patient states she feels weak.   CHEST/RESPIRATORY:  No shortness of breath.  CARDIAC:   No chest pain.    GI:  No current nausea and vomiting.  She has a liquid diet.  Ate a full bowl of grits today and seemed to tolerate this well.   GU:  No dysuria.    PHYSICAL EXAMINATION:   GENERAL APPEARANCE:  The patient is not in any distress.   CHEST/RESPIRATORY:  Clear air entry bilaterally.   CARDIOVASCULAR:  CARDIAC:  Other than AFib, no relevant findings.  She appears to be euvolemic.   GASTROINTESTINAL:  ABDOMEN:   PEG tube noted.  This is blocked off.  Her abdomen is not distended.   LIVER/SPLEEN/KIDNEYS:  No liver, no spleen.  No tenderness.   GENITOURINARY:  BLADDER:   No suprapubic or  costovertebral angle tenderness is noted.   CIRCULATION:  EDEMA/VARICOSITIES:  Extremities:  No edema.  No evidence of a DVT.    ASSESSMENT/PLAN:    Recurrent colon cancer, as discussed above.  This presented with small bowel obstruction at the ileocolonic anastomosis.  She now has a venting PEG tube for abdominal distention and/or nausea and vomiting.  I have discussed involving Hospice at this point.  From my understanding, she is going to remain in the building versus home with Hospice care.    Atrial fibrillation.  Her heart rate is controlled, in the 70s this morning.    History of gastroesophageal reflux disease.  On Protonix.    Chronic normocytic anemia.  Discharged from the hospital with a hemoglobin of 10.4 on 01/21/2014.  I will follow up on this episodically.  She does have, of course, fecal occult blood positive stool.    I will see how this lady does in the short term.  I have asked social work to discuss the Hospice-related issues with the family and the patient who seems to be quite cognitively intact.    We may be able to gently increase the consistency of her diet.   However, we will see how she does.

## 2014-01-26 NOTE — Progress Notes (Signed)
Patient ID: [name], female   DOB: 1923-02-09, 80 y.o.   MRN: [id number]   This is an acute visit.  Level care skilled.  Facility [facility].  Chief complaint  Acute visit secondary to nausea.  History of present illness.  Patient is a very pleasant 80 year old female with a complex medical history including a history of [name] cancer status post hemicolectomy-also recently history of a small bowel obstruction-this was treated in the hospital she now has a venting PEG tube.  There is recommendation for hospice care palliative care.  She continues to complain of significant nausea which was her presenting symptom in the hospital as well.  She is on probiotics with a history of GERD she also has Zofran every 8 hours when necessary as well as Phenergan when necessary.  Apparently the Zofran is helping some but she does not get this it appears on a regular basis.  She says her appetite is quite poor secondary to the nausea-vital signs continued to be stable.  She does not really complain of abdominal discomfort.  Family medical social history as been reviewed per admission note on 01/24/2014.  Medications have been reviewed per MAR.  Review of systems.  In general does not complain of any fever or chills.  Head ears eyes nose mouth and throat-she does not really complain of dysphagia or sore throat.  Respiratory is not complaining of any shortness of breath or cough.  Cardiac no chest pain complaints.  GI significant for nausea-this is been somewhat of a persistent issue here during her hospitalization as well.  Does not complain of abdominal pain questionable intermittent vomiting but appears to be more of a nausea history here.  Muscle skeletal does not complain of joint pain she does have weakness.  Neurologic she is not complaining of headache or dizziness or syncopal-type feelings.  Physical exam.  Temperature is 97.3 pulse 79 respirations 20 blood pressure  110/60.  In general this is a very pleasant elderly female in no distress but she appears weak.  Her skin is warm and dry.  Oropharynx is clear mucous membranes appear fairly moist.  Chest she does have some mild congestion but this clears with cough there is no labored breathing.  Heart is regular rate and rhythmwith occasional irregular beats without murmur gallop or rub she has a trace lower extremity edema bilaterally.  Her abdomen continues to be soft nontender there are positive bowel sounds.--PEG site appears unremarkable  Muscle skeletal she has generalized weakness but I did not note any deformities.  Neurologic she is grossly intact cranial nerves intact her speech is clear.  Psych she is alert and oriented pleasant and appropriate.  Labs.  01/20/2014.  Sodium 138 potassium 3.5 BUN 10 creatinine 0.78.  01/21/2014.  WBC 6.4 hemoglobin 10.4 platelets 229.  Assessment and   nausea-this is somewhat persistent and challenging issue-I did speak with nursing and they apparently are venting the PEG tube with regularity-will change her Zofran to every 8 hours routine and monitor she apparently does get  some relief with this and hopefully doing it on a routine basis will give relief as well as careful attention to her venting PEG.  Also would like to update her labs including a metabolic panel to evaluate her renal function and electrolytes-also will check a CBC she does have a history of anemia would like to make sure this is stable as well as.  Clinically she appears stable although weak-  [id number]-of note more than 25  minutes spent assessing patient-reviewing her chart-discussing her status with nursing staff as well as with Dr. Dellia Nims via phone-and coordinating plan of care-of note greater than 50% of time spent coordinating plan of care.

## 2014-01-28 DIAGNOSIS — R110 Nausea: Secondary | ICD-10-CM | POA: Insufficient documentation

## 2014-02-13 DEATH — deceased

## 2014-02-22 ENCOUNTER — Encounter (HOSPITAL_COMMUNITY): Payer: Self-pay | Admitting: Internal Medicine
